# Patient Record
Sex: Female | Born: 1945 | Race: Black or African American | Hispanic: No | Marital: Married | Smoking: Never smoker
Health system: Southern US, Community
[De-identification: ages and names within clinical notes are randomized; demographics above are authoritative.]

## PROBLEM LIST (undated history)

## (undated) DIAGNOSIS — M8580 Other specified disorders of bone density and structure, unspecified site: Secondary | ICD-10-CM

## (undated) DIAGNOSIS — I10 Essential (primary) hypertension: Secondary | ICD-10-CM

## (undated) DIAGNOSIS — K5790 Diverticulosis of intestine, part unspecified, without perforation or abscess without bleeding: Secondary | ICD-10-CM

## (undated) DIAGNOSIS — T8859XA Other complications of anesthesia, initial encounter: Secondary | ICD-10-CM

## (undated) DIAGNOSIS — K449 Diaphragmatic hernia without obstruction or gangrene: Secondary | ICD-10-CM

## (undated) DIAGNOSIS — E7800 Pure hypercholesterolemia, unspecified: Secondary | ICD-10-CM

## (undated) DIAGNOSIS — E559 Vitamin D deficiency, unspecified: Secondary | ICD-10-CM

## (undated) DIAGNOSIS — T4145XA Adverse effect of unspecified anesthetic, initial encounter: Secondary | ICD-10-CM

## (undated) HISTORY — PX: AUGMENTATION MAMMAPLASTY: SUR837

## (undated) HISTORY — DX: Other specified disorders of bone density and structure, unspecified site: M85.80

## (undated) HISTORY — DX: Diverticulosis of intestine, part unspecified, without perforation or abscess without bleeding: K57.90

## (undated) HISTORY — DX: Vitamin D deficiency, unspecified: E55.9

## (undated) HISTORY — PX: OTHER SURGICAL HISTORY: SHX169

## (undated) HISTORY — PX: ABDOMINAL HYSTERECTOMY: SHX81

## (undated) HISTORY — DX: Pure hypercholesterolemia, unspecified: E78.00

## (undated) HISTORY — DX: Diaphragmatic hernia without obstruction or gangrene: K44.9

## (undated) HISTORY — PX: TUBAL LIGATION: SHX77

---

## 1973-09-09 HISTORY — PX: BREAST BIOPSY: SHX20

## 1997-12-16 ENCOUNTER — Ambulatory Visit (HOSPITAL_COMMUNITY): Admission: RE | Admit: 1997-12-16 | Discharge: 1997-12-16 | Payer: Self-pay | Admitting: *Deleted

## 1998-01-04 ENCOUNTER — Other Ambulatory Visit: Admission: RE | Admit: 1998-01-04 | Discharge: 1998-01-04 | Payer: Self-pay | Admitting: Obstetrics and Gynecology

## 1998-05-18 ENCOUNTER — Ambulatory Visit (HOSPITAL_COMMUNITY): Admission: RE | Admit: 1998-05-18 | Discharge: 1998-05-18 | Payer: Self-pay | Admitting: *Deleted

## 1998-12-16 ENCOUNTER — Emergency Department (HOSPITAL_COMMUNITY): Admission: EM | Admit: 1998-12-16 | Discharge: 1998-12-16 | Payer: Self-pay | Admitting: *Deleted

## 2001-09-18 ENCOUNTER — Emergency Department (HOSPITAL_COMMUNITY): Admission: EM | Admit: 2001-09-18 | Discharge: 2001-09-18 | Payer: Self-pay | Admitting: Emergency Medicine

## 2001-09-20 ENCOUNTER — Encounter: Payer: Self-pay | Admitting: Internal Medicine

## 2001-09-20 ENCOUNTER — Emergency Department (HOSPITAL_COMMUNITY): Admission: EM | Admit: 2001-09-20 | Discharge: 2001-09-20 | Payer: Self-pay | Admitting: Emergency Medicine

## 2002-11-03 ENCOUNTER — Encounter: Payer: Self-pay | Admitting: *Deleted

## 2002-11-03 ENCOUNTER — Encounter: Admission: RE | Admit: 2002-11-03 | Discharge: 2002-11-03 | Payer: Self-pay | Admitting: *Deleted

## 2002-11-08 ENCOUNTER — Encounter: Payer: Self-pay | Admitting: Internal Medicine

## 2002-11-08 ENCOUNTER — Encounter: Admission: RE | Admit: 2002-11-08 | Discharge: 2002-11-08 | Payer: Self-pay | Admitting: Internal Medicine

## 2003-06-08 ENCOUNTER — Encounter: Admission: RE | Admit: 2003-06-08 | Discharge: 2003-06-08 | Payer: Self-pay | Admitting: *Deleted

## 2003-06-08 ENCOUNTER — Encounter: Payer: Self-pay | Admitting: *Deleted

## 2004-03-07 ENCOUNTER — Encounter: Admission: RE | Admit: 2004-03-07 | Discharge: 2004-03-07 | Payer: Self-pay | Admitting: Internal Medicine

## 2006-01-09 ENCOUNTER — Ambulatory Visit: Payer: Self-pay | Admitting: Hematology and Oncology

## 2006-01-17 LAB — CBC WITH DIFFERENTIAL/PLATELET
Basophils Absolute: 0 10*3/uL (ref 0.0–0.1)
EOS%: 0.6 % (ref 0.0–7.0)
Eosinophils Absolute: 0 10*3/uL (ref 0.0–0.5)
HCT: 35.3 % (ref 34.8–46.6)
HGB: 11.9 g/dL (ref 11.6–15.9)
MONO#: 0.3 10*3/uL (ref 0.1–0.9)
NEUT#: 1.8 10*3/uL (ref 1.5–6.5)
RDW: 14.3 % (ref 11.3–14.5)
WBC: 3.4 10*3/uL — ABNORMAL LOW (ref 3.9–10.0)
lymph#: 1.2 10*3/uL (ref 0.9–3.3)

## 2006-01-17 LAB — COMPREHENSIVE METABOLIC PANEL
Albumin: 4.5 g/dL (ref 3.5–5.2)
Alkaline Phosphatase: 57 U/L (ref 39–117)
CO2: 28 mEq/L (ref 19–32)
Calcium: 9.3 mg/dL (ref 8.4–10.5)
Chloride: 101 mEq/L (ref 96–112)
Glucose, Bld: 91 mg/dL (ref 70–99)
Potassium: 3.6 mEq/L (ref 3.5–5.3)
Sodium: 139 mEq/L (ref 135–145)
Total Protein: 7.3 g/dL (ref 6.0–8.3)

## 2006-01-20 LAB — FOLATE: Folate: >20 ng/mL

## 2006-01-20 LAB — PROTEIN ELECTROPHORESIS, SERUM
Beta Globulin: 4.9 % (ref 4.7–7.2)
Total Protein, Serum Electrophoresis: 8.1 g/dL (ref 6.0–8.3)

## 2006-03-28 ENCOUNTER — Encounter: Admission: RE | Admit: 2006-03-28 | Discharge: 2006-03-28 | Payer: Self-pay | Admitting: Gastroenterology

## 2006-04-15 ENCOUNTER — Ambulatory Visit: Payer: Self-pay | Admitting: Hematology and Oncology

## 2007-04-14 ENCOUNTER — Ambulatory Visit (HOSPITAL_BASED_OUTPATIENT_CLINIC_OR_DEPARTMENT_OTHER): Admission: RE | Admit: 2007-04-14 | Discharge: 2007-04-14 | Payer: Self-pay | Admitting: Ophthalmology

## 2008-10-05 ENCOUNTER — Encounter: Admission: RE | Admit: 2008-10-05 | Discharge: 2008-10-05 | Payer: Self-pay | Admitting: Family Medicine

## 2010-03-12 ENCOUNTER — Inpatient Hospital Stay (HOSPITAL_COMMUNITY): Admission: EM | Admit: 2010-03-12 | Discharge: 2010-03-13 | Payer: Self-pay | Admitting: Emergency Medicine

## 2010-03-13 ENCOUNTER — Ambulatory Visit: Payer: Self-pay | Admitting: Oncology

## 2010-03-16 LAB — CBC & DIFF AND RETIC
BASO%: 0.2 % (ref 0.0–2.0)
EOS%: 1.4 % (ref 0.0–7.0)
Eosinophils Absolute: 0.1 10*3/uL (ref 0.0–0.5)
HCT: 29.1 % — ABNORMAL LOW (ref 34.8–46.6)
HGB: 10 g/dL — ABNORMAL LOW (ref 11.6–15.9)
LYMPH%: 30 % (ref 14.0–49.7)
MCV: 79.5 fL (ref 79.5–101.0)
MONO%: 6.4 % (ref 0.0–14.0)
NEUT#: 3.1 10*3/uL (ref 1.5–6.5)
NEUT%: 62 % (ref 38.4–76.8)
RDW: 14.9 % — ABNORMAL HIGH (ref 11.2–14.5)
Retic %: 1.22 % (ref 0.50–1.50)

## 2010-03-16 LAB — CHCC SMEAR

## 2010-03-16 LAB — COMPREHENSIVE METABOLIC PANEL
AST: 22 U/L (ref 0–37)
BUN: 16 mg/dL (ref 6–23)
CO2: 27 mEq/L (ref 19–32)
Chloride: 103 mEq/L (ref 96–112)

## 2010-03-16 LAB — LACTATE DEHYDROGENASE: LDH: 164 U/L (ref 94–250)

## 2010-03-16 LAB — MORPHOLOGY

## 2010-03-19 LAB — CREATININE CLEARANCE, URINE, 24 HOUR
Collection Interval-CRCL: 24 hours
Creatinine Clearance: 99 mL/min (ref 75–115)
Creatinine, 24H Ur: 1910 mg/d — ABNORMAL HIGH (ref 700–1800)
Urine Total Volume-CRCL: 3000 mL

## 2010-03-20 LAB — PROTEIN ELECTROPHORESIS, SERUM
Alpha-1-Globulin: 5.1 % — ABNORMAL HIGH (ref 2.9–4.9)
Beta Globulin: 4.6 % — ABNORMAL LOW (ref 4.7–7.2)

## 2010-03-20 LAB — ERYTHROPOIETIN: Erythropoietin: 18.1 m[IU]/mL (ref 2.6–34.0)

## 2010-03-20 LAB — KAPPA/LAMBDA LIGHT CHAINS
Kappa free light chain: 1.37 mg/dL (ref 0.33–1.94)
Kappa:Lambda Ratio: 1.07 (ref 0.26–1.65)
Lambda Free Lght Chn: 1.28 mg/dL (ref 0.57–2.63)

## 2010-03-20 LAB — DIRECT ANTIGLOBULIN TEST (NOT AT ARMC): DAT (Complement): NEGATIVE

## 2010-03-20 LAB — METHYLMALONIC ACID, SERUM: Methylmalonic Acid, Quantitative: 111 nmol/L (ref 87–318)

## 2010-03-20 LAB — HAPTOGLOBIN: Haptoglobin: 210 mg/dL — ABNORMAL HIGH (ref 16–200)

## 2010-06-26 ENCOUNTER — Emergency Department (HOSPITAL_COMMUNITY): Admission: EM | Admit: 2010-06-26 | Discharge: 2010-06-26 | Payer: Self-pay | Admitting: Emergency Medicine

## 2010-11-21 LAB — URINALYSIS, ROUTINE W REFLEX MICROSCOPIC
Glucose, UA: NEGATIVE mg/dL
Hgb urine dipstick: NEGATIVE
Ketones, ur: NEGATIVE mg/dL
Protein, ur: NEGATIVE mg/dL
Urobilinogen, UA: 0.2 mg/dL (ref 0.0–1.0)

## 2010-11-21 LAB — BASIC METABOLIC PANEL
BUN: 11 mg/dL (ref 6–23)
Calcium: 9.5 mg/dL (ref 8.4–10.5)
Chloride: 103 mEq/L (ref 96–112)
GFR calc Af Amer: >60 mL/min (ref >60)
Potassium: 3 mEq/L — ABNORMAL LOW (ref 3.5–5.1)
Sodium: 138 mEq/L (ref 135–145)

## 2010-11-21 LAB — DIFFERENTIAL
Eosinophils Absolute: 0 10*3/uL (ref 0.0–0.7)
Lymphocytes Relative: 38 % (ref 12–46)
Lymphs Abs: 1.6 10*3/uL (ref 0.7–4.0)
Monocytes Absolute: 0.3 10*3/uL (ref 0.1–1.0)
Neutro Abs: 2.2 10*3/uL (ref 1.7–7.7)
Neutrophils Relative %: 52 % (ref 43–77)

## 2010-11-21 LAB — POCT CARDIAC MARKERS
CKMB, poc: <1 ng/mL — ABNORMAL LOW (ref 1.0–8.0)
Troponin i, poc: <0.05 ng/mL (ref 0.00–0.09)

## 2010-11-25 LAB — POCT I-STAT, CHEM 8
BUN: 16 mg/dL (ref 6–23)
Chloride: 107 mEq/L (ref 96–112)
Creatinine, Ser: 1.8 mg/dL — ABNORMAL HIGH (ref 0.4–1.2)
Hemoglobin: 9.9 g/dL — ABNORMAL LOW (ref 12.0–15.0)
Potassium: 2.9 mEq/L — ABNORMAL LOW (ref 3.5–5.1)

## 2010-11-25 LAB — CBC
HCT: 24 % — ABNORMAL LOW (ref 36.0–46.0)
HCT: 27.6 % — ABNORMAL LOW (ref 36.0–46.0)
Hemoglobin: 8.1 g/dL — ABNORMAL LOW (ref 12.0–15.0)
Hemoglobin: 9.4 g/dL — ABNORMAL LOW (ref 12.0–15.0)
MCH: 28.1 pg (ref 26.0–34.0)
MCHC: 34.2 g/dL (ref 30.0–36.0)
MCV: 83.2 fL (ref 78.0–100.0)
Platelets: 145 10*3/uL — ABNORMAL LOW (ref 150–400)
RBC: 2.88 MIL/uL — ABNORMAL LOW (ref 3.87–5.11)
RBC: 3.36 MIL/uL — ABNORMAL LOW (ref 3.87–5.11)
RDW: 15 % (ref 11.5–15.5)
WBC: 4 10*3/uL (ref 4.0–10.5)

## 2010-11-25 LAB — FERRITIN: Ferritin: 219 ng/mL (ref 10–291)

## 2010-11-25 LAB — IRON AND TIBC
Iron: 61 ug/dL (ref 42–135)
TIBC: 180 ug/dL — ABNORMAL LOW (ref 250–470)
UIBC: 119 ug/dL

## 2010-11-25 LAB — URINE CULTURE

## 2010-11-25 LAB — DIFFERENTIAL
Basophils Absolute: 0 10*3/uL (ref 0.0–0.1)
Basophils Relative: 1 % (ref 0–1)
Eosinophils Absolute: 0.1 10*3/uL (ref 0.0–0.7)
Lymphs Abs: 1 10*3/uL (ref 0.7–4.0)
Monocytes Relative: 10 % (ref 3–12)

## 2010-11-25 LAB — COMPREHENSIVE METABOLIC PANEL
Albumin: 2.9 g/dL — ABNORMAL LOW (ref 3.5–5.2)
BUN: 10 mg/dL (ref 6–23)
Calcium: 8.3 mg/dL — ABNORMAL LOW (ref 8.4–10.5)
Glucose, Bld: 96 mg/dL (ref 70–99)
Potassium: 3.3 mEq/L — ABNORMAL LOW (ref 3.5–5.1)
Sodium: 140 mEq/L (ref 135–145)
Total Protein: 5.8 g/dL — ABNORMAL LOW (ref 6.0–8.3)

## 2010-11-25 LAB — URINALYSIS, ROUTINE W REFLEX MICROSCOPIC
Bilirubin Urine: NEGATIVE
Glucose, UA: NEGATIVE mg/dL
Nitrite: NEGATIVE
Protein, ur: NEGATIVE mg/dL
Urobilinogen, UA: 0.2 mg/dL (ref 0.0–1.0)

## 2010-11-25 LAB — URINE MICROSCOPIC-ADD ON

## 2010-11-25 LAB — POCT CARDIAC MARKERS
CKMB, poc: <1 ng/mL — ABNORMAL LOW (ref 1.0–8.0)
Troponin i, poc: <0.05 ng/mL (ref 0.00–0.09)

## 2010-11-25 LAB — RETICULOCYTES: Retic Count, Absolute: 33.2 10*3/uL (ref 19.0–186.0)

## 2010-11-25 LAB — TSH: TSH: 0.865 u[IU]/mL (ref 0.350–4.500)

## 2010-11-25 LAB — FOLATE: Folate: 19 ng/mL

## 2011-01-25 NOTE — Op Note (Signed)
Anne [name], Anne [name]              ACCOUNT NO.:  [id number]   MEDICAL RECORD NO.:  [id number]          PATIENT TYPE:  AMB   LOCATION:  DSC                          FACILITY:  [facility]   PHYSICIAN:  Salley Scarlet., M.D.DATE OF BIRTH:  02/19/1946   DATE OF PROCEDURE:  04/16/2007  DATE OF DISCHARGE:                               OPERATIVE REPORT   PREOPERATIVE DIAGNOSIS:  Chalazion upper lid left eye.   POSTOPERATIVE DIAGNOSIS:  Chalazion upper lid left eye.   OPERATION:  Chalazion excision.   ANESTHESIA:  Local using Xylocaine 1%.   PROCEDURE:  The upper lid on the left eye was inspected and there was  moderate size lesion located on the lateral aspect of the upper lid of  the left eye.  The patient was then prepped and draped in the usual  manner.  The lid was then infiltrated with several mL of Xylocaine.  The  chalazion clamp was applied over the lesion and the lid was everted.  A  cruciate incision was made in the tarsus over the lesion and the lesion  was curetted using a chalazion curette.  The sac was excised in total  using sharp and blunt dissection.  Polysporin ophthalmic ointment and a  pressure patch was applied.  The patient tolerated the procedure well  and was discharged to the post anesthesia recovery room in satisfactory  condition.  She is instructed to take Darvocet N 100 every four hours as  needed for pain, to remove the patch tomorrow, to resume her drops four  times a day and to see me in the office in one week.   DISCHARGE DIAGNOSIS:  Chalazion upper lid left eye.      Salley Scarlet., M.D.  Electronically Signed     TB/MEDQ  D:  04/16/2007  T:  04/16/2007  Job:  [id number]

## 2011-10-01 ENCOUNTER — Inpatient Hospital Stay (HOSPITAL_COMMUNITY)
Admission: EM | Admit: 2011-10-01 | Discharge: 2011-10-02 | DRG: 305 | Disposition: A | Discharge status: Home / Self Care | Payer: Managed Care, Other (non HMO) | Source: Ambulatory Visit | Attending: Internal Medicine | Admitting: Internal Medicine

## 2011-10-01 ENCOUNTER — Other Ambulatory Visit: Payer: Self-pay

## 2011-10-01 ENCOUNTER — Emergency Department (HOSPITAL_COMMUNITY): Payer: Managed Care, Other (non HMO)

## 2011-10-01 ENCOUNTER — Encounter (HOSPITAL_COMMUNITY): Payer: Self-pay | Admitting: *Deleted

## 2011-10-01 DIAGNOSIS — N390 Urinary tract infection, site not specified: Secondary | ICD-10-CM | POA: Diagnosis present

## 2011-10-01 DIAGNOSIS — I2789 Other specified pulmonary heart diseases: Secondary | ICD-10-CM | POA: Diagnosis present

## 2011-10-01 DIAGNOSIS — R0989 Other specified symptoms and signs involving the circulatory and respiratory systems: Secondary | ICD-10-CM | POA: Diagnosis present

## 2011-10-01 DIAGNOSIS — I10 Essential (primary) hypertension: Principal | ICD-10-CM | POA: Diagnosis present

## 2011-10-01 DIAGNOSIS — R0602 Shortness of breath: Secondary | ICD-10-CM | POA: Diagnosis present

## 2011-10-01 DIAGNOSIS — Z888 Allergy status to other drugs, medicaments and biological substances status: Secondary | ICD-10-CM

## 2011-10-01 DIAGNOSIS — R0609 Other forms of dyspnea: Secondary | ICD-10-CM | POA: Diagnosis present

## 2011-10-01 DIAGNOSIS — D649 Anemia, unspecified: Secondary | ICD-10-CM | POA: Diagnosis present

## 2011-10-01 HISTORY — DX: Essential (primary) hypertension: I10

## 2011-10-01 LAB — CARDIAC PANEL(CRET KIN+CKTOT+MB+TROPI)
CK, MB: 1.9 ng/mL (ref 0.3–4.0)
CK, MB: 1.6 ng/mL (ref 0.3–4.0)
Relative Index: 1.2 (ref 0.0–2.5)
Relative Index: INVALID (ref 0.0–2.5)
Total CK: 96 U/L (ref 7–177)
Troponin I: <0.3 ng/mL (ref <0.30)
Troponin I: <0.3 ng/mL (ref <0.30)

## 2011-10-01 LAB — COMPREHENSIVE METABOLIC PANEL
BUN: 8 mg/dL (ref 6–23)
CO2: 27 mEq/L (ref 19–32)
Calcium: 9.4 mg/dL (ref 8.4–10.5)
Chloride: 104 mEq/L (ref 96–112)
Creatinine, Ser: 0.84 mg/dL (ref 0.50–1.10)
GFR calc non Af Amer: 72 mL/min — ABNORMAL LOW (ref >90)
Total Bilirubin: 0.4 mg/dL (ref 0.3–1.2)

## 2011-10-01 LAB — IRON AND TIBC
Iron: 60 ug/dL (ref 42–135)
Saturation Ratios: 24 % (ref 20–55)
TIBC: 255 ug/dL (ref 250–470)
UIBC: 195 ug/dL (ref 125–400)

## 2011-10-01 LAB — URINE MICROSCOPIC-ADD ON

## 2011-10-01 LAB — POCT I-STAT, CHEM 8
Calcium, Ion: 1.15 mmol/L (ref 1.12–1.32)
Creatinine, Ser: 1 mg/dL (ref 0.50–1.10)
Hemoglobin: 12.2 g/dL (ref 12.0–15.0)
Sodium: 141 mEq/L (ref 135–145)
TCO2: 27 mmol/L (ref 0–100)

## 2011-10-01 LAB — URINALYSIS, ROUTINE W REFLEX MICROSCOPIC
Protein, ur: NEGATIVE mg/dL
Urobilinogen, UA: 0.2 mg/dL (ref 0.0–1.0)

## 2011-10-01 LAB — CBC
MCH: 27.9 pg (ref 26.0–34.0)
MCV: 80.9 fL (ref 78.0–100.0)
Platelets: 180 10*3/uL (ref 150–400)
Platelets: 184 10*3/uL (ref 150–400)
RBC: 4.17 MIL/uL (ref 3.87–5.11)
RDW: 14.2 % (ref 11.5–15.5)
WBC: 4.2 10*3/uL (ref 4.0–10.5)

## 2011-10-01 LAB — PROTIME-INR: Prothrombin Time: 13.3 seconds (ref 11.6–15.2)

## 2011-10-01 LAB — DIFFERENTIAL
Basophils Absolute: 0 10*3/uL (ref 0.0–0.1)
Basophils Relative: 1 % (ref 0–1)
Eosinophils Absolute: 0.1 10*3/uL (ref 0.0–0.7)
Eosinophils Relative: 3 % (ref 0–5)

## 2011-10-01 LAB — BASIC METABOLIC PANEL
CO2: 27 mEq/L (ref 19–32)
Calcium: 9.8 mg/dL (ref 8.4–10.5)
Creatinine, Ser: 0.9 mg/dL (ref 0.50–1.10)
Glucose, Bld: 106 mg/dL — ABNORMAL HIGH (ref 70–99)

## 2011-10-01 LAB — APTT: aPTT: 28 seconds (ref 24–37)

## 2011-10-01 LAB — SEDIMENTATION RATE: Sed Rate: 4 mm/hr (ref 0–22)

## 2011-10-01 LAB — URINE CULTURE

## 2011-10-01 LAB — TSH: TSH: 4.258 u[IU]/mL (ref 0.350–4.500)

## 2011-10-01 LAB — POCT I-STAT TROPONIN I

## 2011-10-01 LAB — FERRITIN: Ferritin: 55 ng/mL (ref 10–291)

## 2011-10-01 MED ORDER — DEXTROSE 5 % IV SOLN
1.0000 g | INTRAVENOUS | Status: DC
  Administered 2011-10-01 – 2011-10-02 (×2): 1 g via INTRAVENOUS
  Filled 2011-10-01 (×2): qty 10

## 2011-10-01 MED ORDER — ONDANSETRON HCL 4 MG/2ML IJ SOLN: 4.0000 mg | Freq: Four times a day (QID) | INTRAMUSCULAR | Status: DC | PRN

## 2011-10-01 MED ORDER — HYDRALAZINE HCL 25 MG PO TABS
25.0000 mg | ORAL_TABLET | Freq: Two times a day (BID) | ORAL | Status: DC
  Administered 2011-10-01 – 2011-10-02 (×3): 25 mg via ORAL
  Filled 2011-10-01 (×5): qty 1

## 2011-10-01 MED ORDER — ACETAMINOPHEN 650 MG RE SUPP: 650.0000 mg | Freq: Four times a day (QID) | RECTAL | Status: DC | PRN

## 2011-10-01 MED ORDER — ONDANSETRON HCL 4 MG PO TABS: 4.0000 mg | ORAL_TABLET | Freq: Four times a day (QID) | ORAL | Status: DC | PRN

## 2011-10-01 MED ORDER — ACETAMINOPHEN 325 MG PO TABS: 650.0000 mg | ORAL_TABLET | Freq: Four times a day (QID) | ORAL | Status: DC | PRN

## 2011-10-01 MED ORDER — SODIUM CHLORIDE 0.9 % IJ SOLN
3.0000 mL | Freq: Two times a day (BID) | INTRAMUSCULAR | Status: DC
  Administered 2011-10-01 (×2): 3 mL via INTRAVENOUS

## 2011-10-01 MED ORDER — HYDRALAZINE HCL 20 MG/ML IJ SOLN
10.0000 mg | INTRAMUSCULAR | Status: DC | PRN
  Filled 2011-10-01: qty 0.5

## 2011-10-01 MED ORDER — SODIUM CHLORIDE 0.9 % IV SOLN
Freq: Once | INTRAVENOUS | Status: AC
  Administered 2011-10-01: 01:00:00 via INTRAVENOUS

## 2011-10-01 NOTE — ED Notes (Signed)
Per ems pt watching TV had a sudden onset of sob and left arm pain. Pt 98% on room air and did not appear dyspnic. Pt states her left arm pain was a 10/10 prior to ems X (EMS placed pt on o2, gave 324mg  of baby asa, and 1 NTG, pain down to a 6/10

## 2011-10-01 NOTE — ED Notes (Signed)
ED EKG taken upon arriving in dept. New and old EKG to Dr Hyacinth Meeker.

## 2011-10-01 NOTE — Significant Event (Signed)
RN called this NP because pt was being argumentative about getting BP med. Her BP is up and she needs IV Hydralazine, but the pt is concerned because she has "had so many problems" with BP meds in the past. She says "it is all in my chart" and "you should have the records".  See new allergy updated list. However, every medicine she said she couldn't take, she didn't know the side effect or incident that occurred that caused her to think she was intolerant. She did say she had low potassium once with one drug ? name and had swelling of her legs with the Norvasc. Other than this, she is unable to elaborate on any reactions to BP meds. She doesn't have a list of these intolerances/meds at home and apparently, didn't relay this info on admission. I looked in some old records from a past hospitalization which listed some of the meds I have added to her allergy list under allergy category, but none of the previous documentation states what type of reaction she has had in the past.  I reassured the pt that we were only using IV med to bring her pressure down if it was higher than 160. I told her she really needs to have a thorough record of these meds and their reactions because being that she has hypertension, one of those may save her life one day. I told her because of her statement, we are limited as to what we can give her at this point, and that is why she is only on Apresoline and not any p.o. meds at this point.  Perhaps, day docs can touch base with her PCP to see what has happened in the past with BP meds. I will pass this along in report.  Pt seemed satisfied with explanation, although she continues to have a beligerant and argumentative attitude.  Maren Reamer, NP [facility]

## 2011-10-01 NOTE — Progress Notes (Signed)
   CARE MANAGEMENT NOTE 10/01/2011  Patient:  Anne [name], Anne [name]   Account Number:  [id number]  Date Initiated:  10/01/2011  Documentation initiated by:  Letha Cape  Subjective/Objective Assessment:   dx sob,htn  admit- lives with spouse     Action/Plan:   cycle ce's and echo, bp meds   Anticipated DC Date:  10/02/2011   Anticipated DC Plan:  HOME/SELF CARE      DC Planning Services  CM consult      Choice offered to / List presented to:             Status of service:  In process, will continue to follow Medicare Important Message given?   (If response is "NO", the following Medicare IM given date fields will be blank) Date Medicare IM given:   Date Additional Medicare IM given:    Discharge Disposition:    Per UR Regulation:    Comments:  1/122/13 15:39 Letha Cape RN, BSN [phone] patient lives with spouse, pta independent.

## 2011-10-01 NOTE — Progress Notes (Signed)
Subjective: Patient feeling better. Dyspnea has resolved. She said I did not refuse the medication, I just wanted to know if there was allergy  interaction with my others medications.  Objective: Filed Vitals:   10/01/11 0346 10/01/11 0437 10/01/11 0530 10/01/11 0751  BP: 187/98  160/86 156/75  Pulse: 64  65   Temp: 98.2 F (36.8 C)     TempSrc: Oral     Resp: 18  20   Height:   5\' 4"  (1.626 m)   Weight:  77.2 kg (170 lb 3.1 oz)    SpO2: 100%      Weight change:  No intake or output data in the 24 hours ending 10/01/11 7829  General: Alert, awake, oriented x3, in no acute distress.  HEENT: No bruits, no goiter.  Heart: Regular rate and rhythm, without murmurs, rubs, gallops.  Lungs: CTA, bilateral air movement.  Abdomen: Soft, nontender, nondistended, positive bowel sounds.  Neuro: Grossly intact, nonfocal. Extremities trace edema.   Lab Results:  Basename 10/01/11 0500 10/01/11 0033 10/01/11 0011  NA 140 141 --  K 3.5 3.5 --  CL 104 103 --  CO2 27 -- 27  GLUCOSE 111* 107* --  BUN 8 9 --  CREATININE 0.84 1.00 --  CALCIUM 9.4 -- 9.8  MG -- -- --  PHOS -- -- --    Basename 10/01/11 0500  AST 17  ALT 13  ALKPHOS 60  BILITOT 0.4  PROT 7.5  ALBUMIN 3.8    Basename 10/01/11 0500 10/01/11 0033 10/01/11 0011  WBC 4.2 -- 4.3  NEUTROABS -- -- 1.8  HGB 11.6* 12.2 --  HCT 33.6* 36.0 --  MCV 80.6 -- 80.9  PLT 184 -- 180    Basename 10/01/11 0430  CKTOTAL 155  CKMB 1.9  CKMBINDEX --  TROPONINI <0.30    Basename 10/01/11 0500  VITAMINB12 --  FOLATE --  FERRITIN --  TIBC --  IRON --  RETICCTPCT 1.7    Micro Results: No results found for this or any previous visit (from the past 240 hour(s)).  Studies/Results: Dg Chest Port 1 View  10/01/2011  *RADIOLOGY REPORT*  Clinical Data: Shortness of breath and left arm pain.  PORTABLE CHEST - 1 VIEW  Comparison: PA and lateral chest 06/27/2010.  Findings: The lungs are clear.  Heart size is normal.  No  pneumothorax or effusion.  No focal bony abnormality.  IMPRESSION: No acute disease.  Original Report Authenticated By: Bernadene Bell. Maricela Curet, M.D.    Medications: I have reviewed the patient's current medications.   Patient Active Hospital Problem List:  Shortness of breath (10/01/2011)  Probably related to increase blood pressure. But continue with cycling cardiac enzymes. ECHO pending. D dimer negative.  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 I will start Hydralazine twice a day. Patient prefer not to try a medication that would affect her Potassium.   I ask Pharmacy to review allergy interaction with hydralazine.       LOS: 0 days   REGALADO,BELKYS M.D.  [facility] 10/01/2011, 8:22 AM

## 2011-10-01 NOTE — H&P (Addendum)
Anne [name] is an 66 y.o. female.   PCP - Dr.Katherine Haynes Dage in [place]. Chief Complaint: Shortness of breath. HPI: 66 year-old female with history of hypertension present to the ER because of sudden onset of shortness of breath last night while at home. The shortness of breath lasted for around half an hour and got relieved after the EMS gave the patient sublingual nitroglycerin. However the shortness of breath patient also had left arm pain and some bitemporal headache. At this time patient is asymptomatic and has been admitted to rule out ACS. Patient has been a known hypertensive and used to take Maxzide. But 2 weeks ago when she felt weak she stopped it and start taking over-the-counter medications. Patient denies any nausea vomiting abdominal pain diarrhea or dysuria. She denies any focal deficits or any visual symptoms or any slurring of speech.  Past Medical History  Diagnosis Date  . Hypertension     Past Surgical History  Procedure Date  . Abdominal hysterectomy     Family History  Problem Relation Age of Onset  . Stroke Mother   . Stroke Brother    Social History:  reports that she has never smoked. She does not have any smokeless tobacco history on file. She reports that she does not drink alcohol. Her drug history not on file.  Allergies:  Allergies  Allergen Reactions  . Naproxen Nausea And Vomiting    Medications Prior to Admission  Medication Dose Route Frequency Provider Last Rate Last Dose  . 0.9 %  sodium chloride infusion   Intravenous Once Vida Roller, MD 75 mL/hr at 10/01/11 0033     No current outpatient prescriptions on file as of 10/01/2011.    Results for orders placed during the hospital encounter of 10/01/11 (from the past 48 hour(s))  BASIC METABOLIC PANEL     Status: Abnormal   Collection Time   10/01/11 12:11 AM      Component Value Range Comment   Sodium 139  135 - 145 (mEq/L)    Potassium 3.5  3.5 - 5.1 (mEq/L)    Chloride 101  96 - 112  (mEq/L)    CO2 27  19 - 32 (mEq/L)    Glucose, Bld 106 (*) 70 - 99 (mg/dL)    BUN 10  6 - 23 (mg/dL)    Creatinine, Ser 7.82  0.50 - 1.10 (mg/dL)    Calcium 9.8  8.4 - 10.5 (mg/dL)    GFR calc non Af Amer 67 (*) >90 (mL/min)    GFR calc Af Amer 77 (*) >90 (mL/min)   CBC     Status: Abnormal   Collection Time   10/01/11 12:11 AM      Component Value Range Comment   WBC 4.3  4.0 - 10.5 (K/uL)    RBC 4.19  3.87 - 5.11 (MIL/uL)    Hemoglobin 11.7 (*) 12.0 - 15.0 (g/dL)    HCT 95.6 (*) 21.3 - 46.0 (%)    MCV 80.9  78.0 - 100.0 (fL)    MCH 27.9  26.0 - 34.0 (pg)    MCHC 34.5  30.0 - 36.0 (g/dL)    RDW 08.6  57.8 - 46.9 (%)    Platelets 180  150 - 400 (K/uL)   DIFFERENTIAL     Status: Abnormal   Collection Time   10/01/11 12:11 AM      Component Value Range Comment   Neutrophils Relative 41 (*) 43 - 77 (%)    Neutro  Abs 1.8  1.7 - 7.7 (K/uL)    Lymphocytes Relative 49 (*) 12 - 46 (%)    Lymphs Abs 2.1  0.7 - 4.0 (K/uL)    Monocytes Relative 6  3 - 12 (%)    Monocytes Absolute 0.3  0.1 - 1.0 (K/uL)    Eosinophils Relative 3  0 - 5 (%)    Eosinophils Absolute 0.1  0.0 - 0.7 (K/uL)    Basophils Relative 1  0 - 1 (%)    Basophils Absolute 0.0  0.0 - 0.1 (K/uL)   APTT     Status: Normal   Collection Time   10/01/11 12:11 AM      Component Value Range Comment   aPTT 28  24 - 37 (seconds)   PROTIME-INR     Status: Normal   Collection Time   10/01/11 12:11 AM      Component Value Range Comment   Prothrombin Time 13.3  11.6 - 15.2 (seconds)    INR 0.99  0.00 - 1.49    URINALYSIS, ROUTINE W REFLEX MICROSCOPIC     Status: Abnormal   Collection Time   10/01/11 12:31 AM      Component Value Range Comment   Color, Urine STRAW (*) YELLOW     APPearance CLEAR  CLEAR     Specific Gravity, Urine 1.005  1.005 - 1.030     pH 7.5  5.0 - 8.0     Glucose, UA NEGATIVE  NEGATIVE (mg/dL)    Hgb urine dipstick NEGATIVE  NEGATIVE     Bilirubin Urine NEGATIVE  NEGATIVE     Ketones, ur NEGATIVE   NEGATIVE (mg/dL)    Protein, ur NEGATIVE  NEGATIVE (mg/dL)    Urobilinogen, UA 0.2  0.0 - 1.0 (mg/dL)    Nitrite NEGATIVE  NEGATIVE     Leukocytes, UA MODERATE (*) NEGATIVE    POCT I-STAT TROPONIN I     Status: Normal   Collection Time   10/01/11 12:31 AM      Component Value Range Comment   Troponin i, poc 0.00  0.00 - 0.08 (ng/mL)    Comment 3            URINE MICROSCOPIC-ADD ON     Status: Normal   Collection Time   10/01/11 12:31 AM      Component Value Range Comment   Squamous Epithelial / LPF RARE  RARE     WBC, UA 7-10  <3 (WBC/hpf)    RBC / HPF 0-2  <3 (RBC/hpf)    Bacteria, UA RARE  RARE    POCT I-STAT, CHEM 8     Status: Abnormal   Collection Time   10/01/11 12:33 AM      Component Value Range Comment   Sodium 141  135 - 145 (mEq/L)    Potassium 3.5  3.5 - 5.1 (mEq/L)    Chloride 103  96 - 112 (mEq/L)    BUN 9  6 - 23 (mg/dL)    Creatinine, Ser 3.08  0.50 - 1.10 (mg/dL)    Glucose, Bld 657 (*) 70 - 99 (mg/dL)    Calcium, Ion 8.46  1.12 - 1.32 (mmol/L)    TCO2 27  0 - 100 (mmol/L)    Hemoglobin 12.2  12.0 - 15.0 (g/dL)    HCT 96.2  95.2 - 84.1 (%)    Dg Chest Port 1 View  10/01/2011  *RADIOLOGY REPORT*  Clinical Data: Shortness of breath and left arm pain.  PORTABLE CHEST - 1 VIEW  Comparison: PA and lateral chest 06/27/2010.  Findings: The lungs are clear.  Heart size is normal.  No pneumothorax or effusion.  No focal bony abnormality.  IMPRESSION: No acute disease.  Original Report Authenticated By: Bernadene Bell. Maricela Curet, M.D.    Review of Systems  Constitutional: Negative.   Eyes: Negative.   Respiratory: Positive for shortness of breath.   Cardiovascular: Negative.   Gastrointestinal: Negative.   Genitourinary: Negative.   Musculoskeletal:       Left arm pain.  Skin: Negative.   Neurological: Positive for headaches.  Endo/Heme/Allergies: Negative.   Psychiatric/Behavioral: Negative.     Blood pressure 151/84, pulse 73, temperature 97.9 F (36.6 C),  temperature source Oral, resp. rate 16, SpO2 99.00%. Physical Exam  Constitutional: She is oriented to person, place, and time. She appears well-developed and well-nourished. No distress.  HENT:  Head: Normocephalic and atraumatic.  Right Ear: External ear normal.  Left Ear: External ear normal.  Nose: Nose normal.  Mouth/Throat: Oropharynx is clear and moist. No oropharyngeal exudate.  Eyes: Conjunctivae and EOM are normal. Pupils are equal, round, and reactive to light. Right eye exhibits no discharge. Left eye exhibits no discharge. No scleral icterus.  Neck: Normal range of motion. Neck supple.  Cardiovascular: Normal rate, regular rhythm and normal heart sounds.   Respiratory: Effort normal and breath sounds normal. No respiratory distress. She has no wheezes. She has no rales.  GI: Soft. Bowel sounds are normal. She exhibits no distension. There is no tenderness. There is no rebound.  Musculoskeletal: Normal range of motion. She exhibits no edema and no tenderness.  Neurological: She is alert and oriented to person, place, and time.       Moves upper and lower extremities.  Skin: Skin is warm and dry. No rash noted. She is not diaphoretic. No erythema.  Psychiatric: Her behavior is normal.     Assessment/Plan #1. Shortness of breath with left arm pain improved with nitroglycerin will rule out ACS - we will cycle cardiac markers and get 2-D echo. Check d-dimer and if positive a CT angiogram of the chest. As patient also had some headache we will check sedimentation rate. #2. History of hypertension off medications - patient stopped her medications herself. She used to be on Maxzide. Presently we'll observe and I have checked patient when necessary IV hydralazine. #3. UTI - check urine culture. Will place patient on IV ceftriaxone. #4. Anemia - check anemia panel.  CODE STATUS - full code.  Kadience Macchi N. 10/01/2011, 3:21 AM

## 2011-10-01 NOTE — Progress Notes (Signed)
Utilization review completed.  

## 2011-10-01 NOTE — Progress Notes (Signed)
  Echocardiogram 2D Echocardiogram has been performed.  Bryon Parker F 10/01/2011, 10:12 AM

## 2011-10-01 NOTE — ED Provider Notes (Signed)
History     CSN: [id number]  Arrival date & time 10/01/11  0011   First MD Initiated Contact with Patient 10/01/11 0012      Chief Complaint  Patient presents with  . Arm Pain  . 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 This came on acutely and she sat on the couch watching television this evening. It started approximately 45 minutes prior to arrival, was constant, severe and when paramedics were called they found her to be hypertensive, gave her 4 baby aspirin's and a sublingual nitroglycerin which the patient states improved her symptoms and almost completely resolved in. On arrival her symptoms are absent, she denies fevers, chills, cough, nausea or vomiting, swelling, rash, sore throat, headache. She states that she saw her primary care doctor approximately 2 weeks ago at which time she told that person that she did not want to be on antihypertensives any longer. He was recommended to her to take some over-the-counter natural medications to help with her blood pressure. Since that time she has noticed a slightly elevated blood pressure at 165 and then today the 190/100. She denies any syncopal episodes and denies any chest pain this evening.  Patient and spouse also admits to frequent urination this evening with mild dysuria  Patient is a 66 y.o. female presenting with arm pain and shortness of breath. The history is provided by the patient, the spouse and the EMS personnel.  Arm Pain Associated symptoms include shortness of breath.  Shortness of Breath  Associated symptoms include shortness of breath.    Past Medical History  Diagnosis Date  . Hypertension     History reviewed. No pertinent past surgical history.  History  reviewed. No pertinent family history.  History  Substance Use Topics  . Smoking status: Not on file  . Smokeless tobacco: Not on file  . Alcohol Use:     OB History    Grav Para Term Preterm Abortions TAB SAB Ect Mult Living                  Review of Systems  Respiratory: Positive for shortness of breath.   All other systems reviewed and are negative.    Allergies  Naproxen  Home Medications   Current Outpatient Rx  Name Route Sig Dispense Refill  . CALCIUM CITRATE PO Oral Take 1 tablet by mouth daily.    Marland Kitchen VITAMIN D 1000 UNITS PO TABS Oral Take 1,000 Units by mouth daily.    Marland Kitchen OVER THE COUNTER MEDICATION Oral Take 1 tablet by mouth 3 (three) times daily. BP Manager    . OVER THE COUNTER MEDICATION Oral Take 4 drops by mouth 3 (three) times daily. Aqua-Rite    . OVER THE COUNTER MEDICATION Oral Take by mouth daily. Potassium broth    . TRIAMTERENE-HCTZ 37.5-25 MG PO TABS Oral Take 1 tablet by mouth daily.      BP 163/79  Pulse 65  Temp(Src) 97.9 F (36.6 C) (Oral)  Resp 10  SpO2 99%  Physical Exam  Nursing note and vitals reviewed. Constitutional: She appears well-developed and well-nourished. No distress.  HENT:  Head: Normocephalic and atraumatic.  Mouth/Throat: Oropharynx is clear and moist. No oropharyngeal exudate.  Eyes: Conjunctivae and EOM are normal. Pupils are equal, round, and reactive to light. Right eye exhibits no discharge. Left  eye exhibits no discharge. No scleral icterus.  Neck: Normal range of motion. Neck supple. No JVD present. No thyromegaly present.  Cardiovascular: Normal rate, regular rhythm, normal heart sounds and intact distal pulses.  Exam reveals no gallop and no friction rub.   No murmur heard. Pulmonary/Chest: Effort normal and breath sounds normal. No respiratory distress. She has no wheezes. She has no rales.  Abdominal: Soft. Bowel sounds are normal. She exhibits no distension and no mass. There is tenderness (mild suprapubic  tenderness).       No right lower quadrant or upper abdominal tenderness, non-peritoneal  Musculoskeletal: Normal range of motion. She exhibits no edema and no tenderness.  Lymphadenopathy:    She has no cervical adenopathy.  Neurological: She is alert. Coordination normal.  Skin: Skin is warm and dry. No rash noted. No erythema.  Psychiatric: She has a normal mood and affect. Her behavior is normal.    ED Course  Procedures (including critical care time)  Labs Reviewed  BASIC METABOLIC PANEL - Abnormal; Notable for the following:    Glucose, Bld 106 (*)    GFR calc non Af Amer 67 (*)    GFR calc Af Amer 77 (*)    All other components within normal limits  CBC - Abnormal; Notable for the following:    Hemoglobin 11.7 (*)    HCT 33.9 (*)    All other components within normal limits  DIFFERENTIAL - Abnormal; Notable for the following:    Neutrophils Relative 41 (*)    Lymphocytes Relative 49 (*)    All other components within normal limits  URINALYSIS, ROUTINE W REFLEX MICROSCOPIC - Abnormal; Notable for the following:    Color, Urine STRAW (*)    Leukocytes, UA MODERATE (*)    All other components within normal limits  POCT I-STAT, CHEM 8 - Abnormal; Notable for the following:    Glucose, Bld 107 (*)    All other components within normal limits  APTT  PROTIME-INR  POCT I-STAT TROPONIN I  URINE MICROSCOPIC-ADD ON  I-STAT TROPONIN I  I-STAT, CHEM 8   Dg Chest Port 1 View  10/01/2011  *RADIOLOGY REPORT*  Clinical Data: Shortness of breath and left arm pain.  PORTABLE CHEST - 1 VIEW  Comparison: PA and lateral chest 06/27/2010.  Findings: The lungs are clear.  Heart size is normal.  No pneumothorax or effusion.  No focal bony abnormality.  IMPRESSION: No acute disease.  Original Report Authenticated By: Bernadene Bell. D'ALESSIO, M.D.     1. Chest pain       MDM  Review of the patient's medical record shows that she had a history of acute renal failure which resolved during  her prior admission in July of 2011. She states that she thinks she may have had a stress test though there is no record of this in the medical record. She was also noted to have a urinary tract infection at that time.   ED ECG REPORT   Date: 10/01/2011 00:06  Rate: 67   Rhythm: normal sinus rhythm  QRS Axis: normal  Intervals: normal  ST/T Wave abnormalities: normal  Conduction Disutrbances:none  Narrative Interpretation:   Old EKG Reviewed: unchanged and From 03/12/2010  Blood work reviewed, shows no hypokalemia, she does have a normal white blood cell count and a mild urinary tract infection. Troponin was negative and chest x-ray shows no acute disease. I believe this is a patient is having unusual symptoms, elevated hypertension and is currently  unmedicated for this she should have a rule out admission to the hospital. I discussed this with the hospitalist who agrees and is given temporary orders for admission to a telemetry bed.   Vida Roller, MD 10/01/11 [phone]

## 2011-10-02 LAB — BASIC METABOLIC PANEL
BUN: 14 mg/dL (ref 6–23)
CO2: 28 mEq/L (ref 19–32)
Chloride: 103 mEq/L (ref 96–112)
Creatinine, Ser: 0.99 mg/dL (ref 0.50–1.10)

## 2011-10-02 MED ORDER — LEVOFLOXACIN 500 MG PO TABS: 500.0000 mg | ORAL_TABLET | Freq: Every day | ORAL | Status: AC

## 2011-10-02 MED ORDER — HYDRALAZINE HCL 25 MG PO TABS: 25.0000 mg | ORAL_TABLET | Freq: Two times a day (BID) | ORAL | Status: DC

## 2011-10-02 NOTE — Progress Notes (Signed)
   CARE MANAGEMENT NOTE 10/02/2011  Patient:  Anne [name], Anne [name]   Account Number:  [id number]  Date Initiated:  10/01/2011  Documentation initiated by:  Letha Cape  Subjective/Objective Assessment:   dx sob,htn  admit- lives with spouse     Action/Plan:   cycle ce's and echo, bp meds   Anticipated DC Date:  10/02/2011   Anticipated DC Plan:  HOME/SELF CARE      DC Planning Services  CM consult      Choice offered to / List presented to:             Status of service:  Completed, signed off Medicare Important Message given?   (If response is "NO", the following Medicare IM given date fields will be blank) Date Medicare IM given:   Date Additional Medicare IM given:    Discharge Disposition:  HOME/SELF CARE  Per UR Regulation:    Comments:  10/02/11 15:06 Letha Cape RN, BSn [phone] Patient dc to home , no needs identified.  1/122/13 15:39 Letha Cape RN, BSN [phone] patient lives with spouse, pta independent.

## 2011-10-02 NOTE — Discharge Summary (Signed)
DISCHARGE SUMMARY  Anne [name]  MR#: [id number]  DOB:03/22/1946  Date of Admission: 10/01/2011 Date of Discharge: 10/02/2011  Attending Physician:Seeley Southgate  Patient's PCP:No primary provider on file.  Consults:   Discharge Diagnoses: #1 shortness of breath questionable angina #2 hypertension #3 asymptomatic bacteuria #4 anemia. #5 mild pulmonary hypertension  Present on Admission:  .Shortness of breath .HTN (hypertension)    Medication List  As of 10/02/2011  8:47 AM   STOP taking these medications         triamterene-hydrochlorothiazide 37.5-25 MG per tablet         TAKE these medications         CALCIUM CITRATE PO   Take 1 tablet by mouth daily.      cholecalciferol 1000 UNITS tablet   Commonly known as: VITAMIN D   Take 1,000 Units by mouth daily.      hydrALAZINE 25 MG tablet   Commonly known as: APRESOLINE   Take 1 tablet (25 mg total) by mouth 2 (two) times daily.      levofloxacin 500 MG tablet   Commonly known as: LEVAQUIN   Take 1 tablet (500 mg total) by mouth daily.      OVER THE COUNTER MEDICATION   Take 1 tablet by mouth 3 (three) times daily. BP Manager      OVER THE COUNTER MEDICATION   Take 4 drops by mouth 3 (three) times daily. Aqua-Rite      OVER THE COUNTER MEDICATION   Take by mouth daily. Potassium broth              Hospital Course: Patient is a 66 year old African American female with history of hypertension was admitted to the hospital on 10/01/2011 with sudden onset of shortness of breath while at home. The shortness of breath  lasted about 30 minutes was relieved by nitroglycerin. Patient was also said to have had and pain bitemporal headaches. No documented history of chest pain. No nausea or vomiting. No fever no chills no Rigors. At the time patient was seen by the admitting physician, shortness of breath was said to have been relieved and she was completely asymptomatic.      Examination of patient was said  to have been unremarkable. However, patient had elevated blood pressure. Patient was admitted to telemetry. Blood pressure was controlled with IV hydralazine as well as by mouth hydralazine. She was given Zofran for nausea. 3 set of cardiac enzymes the patient was unremarkable. Chest x-ray was normal. EKG done did not show any acute ST-T wave changes. 2-D echo showed EF of 60-65%, number ventricular cavity and slightly elevated pulmonary artery pressure 36 mm of mercury. Not done patient showed moderate leukocyte. Also given to patient was IV ceftriaxone for asymptomatic bacteria.      Patient was seen by me  today for the first time 10/02/2011. She denied any shortness of breath. No chest pain. No headaches. No nausea or vomiting. She also denies any urinary symptoms. Examination of patient was unremarkable. Vital signs are stable. I discussed patient's case extensively with patient with the RN as well and she verbalized understanding. Patient also  declined to take any diuretics because of prior side effects she had. Plan is for patient to be discharge home today.   Day of Discharge BP 157/80  Pulse 65  Temp(Src) 98 F (36.7 C) (Tympanic)  Resp 18  Ht 5\' 4"  (1.626 m)  Wt 77.2 kg (170 lb 3.1 oz)  BMI 29.21 kg/m2  SpO2  99%  Physical Exam: Vitals as above. HEENT-mild pallor Neck-no JVD Chest-clear to auscultation CVS-S1 and S2 Abdomen-soft, nontender, organs not palpable, bowel sounds are positive. Extremity-trace edema Neuro-no lateralizing signs Skin-no ecchymoses Neuro psych-appropriate affect.  Results for orders placed during the hospital encounter of 10/01/11 (from the past 24 hour(s))  CARDIAC PANEL(CRET KIN+CKTOT+MB+TROPI)     Status: Normal   Collection Time   10/01/11 11:25 AM      Component Value Range   Total CK 112  7 - 177 (U/L)   CK, MB 1.8  0.3 - 4.0 (ng/mL)   Troponin I <0.30  <0.30 (ng/mL)   Relative Index 1.6  0.0 - 2.5   CARDIAC PANEL(CRET KIN+CKTOT+MB+TROPI)      Status: Normal   Collection Time   10/01/11  7:55 PM      Component Value Range   Total CK 96  7 - 177 (U/L)   CK, MB 1.6  0.3 - 4.0 (ng/mL)   Troponin I <0.30  <0.30 (ng/mL)   Relative Index RELATIVE INDEX IS INVALID  0.0 - 2.5   BASIC METABOLIC PANEL     Status: Abnormal   Collection Time   10/02/11  5:45 AM      Component Value Range   Sodium 141  135 - 145 (mEq/L)   Potassium 3.5  3.5 - 5.1 (mEq/L)   Chloride 103  96 - 112 (mEq/L)   CO2 28  19 - 32 (mEq/L)   Glucose, Bld 104 (*) 70 - 99 (mg/dL)   BUN 14  6 - 23 (mg/dL)   Creatinine, Ser 2.13  0.50 - 1.10 (mg/dL)   Calcium 9.3  8.4 - 08.6 (mg/dL)   GFR calc non Af Amer 59 (*) >90 (mL/min)   GFR calc Af Amer 69 (*) >90 (mL/min)    Disposition: stable   Follow-up Appts: Discharge Orders    Future Orders Please Complete By Expires   Diet - low sodium heart healthy      Increase activity slowly      Discharge instructions      Comments:   Follow up with pcp in 1-2weeks        Signed: Finas Delone 10/02/2011, 8:47 AM

## 2011-10-30 ENCOUNTER — Emergency Department (HOSPITAL_COMMUNITY)
Admission: EM | Admit: 2011-10-30 | Discharge: 2011-10-31 | Disposition: A | Discharge status: Home Health | Payer: Managed Care, Other (non HMO) | Attending: Emergency Medicine | Admitting: Emergency Medicine

## 2011-10-30 ENCOUNTER — Encounter (HOSPITAL_COMMUNITY): Payer: Self-pay | Admitting: Emergency Medicine

## 2011-10-30 DIAGNOSIS — I10 Essential (primary) hypertension: Secondary | ICD-10-CM | POA: Insufficient documentation

## 2011-10-30 NOTE — ED Notes (Signed)
Patient complaining of high blood pressure (highest reading today 202/114) -- patient complaining of headache and dizziness; denies shortness of breath and chest pain.  Patient was discharged on the 23rd of January from the hospital due to high blood pressure, headaches, and chest pain.  Patient was given hydralazine; patient followed up with PCP, which increased the dosage.  Blood pressure continued to increase, along with upper and lower extremity swelling.  PCP then prescribed Lasix.

## 2011-10-31 ENCOUNTER — Other Ambulatory Visit: Payer: Self-pay

## 2011-10-31 LAB — DIFFERENTIAL
Basophils Absolute: 0 10*3/uL (ref 0.0–0.1)
Eosinophils Relative: 4 % (ref 0–5)
Lymphocytes Relative: 48 % — ABNORMAL HIGH (ref 12–46)
Monocytes Absolute: 0.3 10*3/uL (ref 0.1–1.0)

## 2011-10-31 LAB — BASIC METABOLIC PANEL
CO2: 27 mEq/L (ref 19–32)
Calcium: 9.7 mg/dL (ref 8.4–10.5)
Creatinine, Ser: 0.88 mg/dL (ref 0.50–1.10)
Glucose, Bld: 100 mg/dL — ABNORMAL HIGH (ref 70–99)

## 2011-10-31 LAB — CBC
HCT: 36.1 % (ref 36.0–46.0)
MCV: 81.3 fL (ref 78.0–100.0)
RDW: 14.1 % (ref 11.5–15.5)
WBC: 4 10*3/uL (ref 4.0–10.5)

## 2011-10-31 NOTE — Discharge Instructions (Signed)
Hypertension Information As your heart beats, it forces blood through your arteries. This force is your blood pressure. If the pressure is too high, it is called hypertension (HTN) or high blood pressure. HTN is dangerous because you may have it and not know it. High blood pressure may mean that your heart has to work harder to pump blood. Your arteries may be narrow or stiff. The extra work puts you at risk for heart disease, stroke, and other problems.  Blood pressure consists of two numbers, a higher number over a lower, 110/72, for example. It is stated as "110 over 72." The ideal is below 120 for the top number (systolic) and under 80 for the bottom (diastolic).  You should pay close attention to your blood pressure if you have certain conditions such as:  Heart failure.   Prior heart attack.   Diabetes   Chronic kidney disease.   Prior stroke.   Multiple risk factors for heart disease.  To see if you have HTN, your blood pressure should be measured while you are seated with your arm held at the level of the heart. It should be measured at least twice. A one-time elevated blood pressure reading (especially in the Emergency Department) does not mean that you need treatment. There may be conditions in which the blood pressure is different between your right and left arms. It is important to see your caregiver soon for a recheck. Most people have essential hypertension which means that there is not a specific cause. This type of high blood pressure may be lowered by changing lifestyle factors such as:  Stress.   Smoking.   Lack of exercise.   Excessive weight.   Drug/tobacco/alcohol use.   Eating less salt.  Most people do not have symptoms from high blood pressure until it has caused damage to the body. Effective treatment can often prevent, delay or reduce that damage. TREATMENT  Treatment for high blood pressure, when a cause has been identified, is directed at the cause. There  are a large number of medications to treat HTN. These fall into several categories, and your caregiver will help you select the medicines that are best for you. Medications may have side effects. You should review side effects with your caregiver. If your blood pressure stays high after you have made lifestyle changes or started on medicines,   Your medication(s) may need to be changed.   Other problems may need to be addressed.   Be certain you understand your prescriptions, and know how and when to take your medicine.   Be sure to follow up with your caregiver within the time frame advised (usually within two weeks) to have your blood pressure rechecked and to review your medications.   If you are taking more than one medicine to lower your blood pressure, make sure you know how and at what times they should be taken. Taking two medicines at the same time can result in blood pressure that is too low.  Document Released: 10/29/2005 Document Revised: 05/08/2011 Document Reviewed: 11/05/2007 [employer] Patient Information 2012 [employer]. 

## 2011-10-31 NOTE — ED Provider Notes (Signed)
History     CSN: [id number]  Arrival date & time 10/30/11  2216   First MD Initiated Contact with Patient 10/31/11 0029      Chief Complaint  Patient presents with  . Hypertension    (Consider location/radiation/quality/duration/timing/severity/associated sxs/prior treatment) Patient is a 66 y.o. female presenting with hypertension.  Hypertension   Pt states last month she was admitted to the hospital for issues with her hypertension.    Pt started taking hydralizine for her blood pressure.  Her doctor increased her dose to 25 mg tid because of persistent high blood pressure and swelling.  She then had it increased to 50mg . This afternoon she was feeling bad and checked her blood pressure was over 200.  She felt dizzy and "out of sorts".  Pt also had a headache.  Patient states she's had trouble with high blood pressure for years and previously was on Maxzide. Patient states when she was in the hospital recently they took her off that and put her on hydralazine. She is not sure if it was because of her potassium. Patient has tried several other blood pressure medications in the past but has not done well with those. Past Medical History  Diagnosis Date  . Hypertension     Past Surgical History  Procedure Date  . Abdominal hysterectomy     Family History  Problem Relation Age of Onset  . Stroke Mother   . Stroke Brother     History  Substance Use Topics  . Smoking status: Never Smoker   . Smokeless tobacco: Not on file  . Alcohol Use: No    OB History    Grav Para Term Preterm Abortions TAB SAB Ect Mult Living                  Review of Systems  All other systems reviewed and are negative.    Allergies  Norvasc; Cardizem; Hytrin; Lisinopril; Lopressor; Maxzide; Naproxen; and Tenex  Home Medications   Current Outpatient Rx  Name Route Sig Dispense Refill  . CALCIUM CITRATE PO Oral Take 1 tablet by mouth daily.    Marland Kitchen VITAMIN D 1000 UNITS PO TABS Oral Take  1,000 Units by mouth daily.    . FUROSEMIDE 20 MG PO TABS Oral Take 20 mg by mouth daily as needed. For swelling    . HYDRALAZINE HCL 25 MG PO TABS Oral Take 50 mg by mouth 3 (three) times daily.    Marland Kitchen OVER THE COUNTER MEDICATION Oral Take by mouth daily. Potassium broth      BP 170/90  Pulse 72  Temp(Src) 98.2 F (36.8 C) (Oral)  Resp 20  SpO2 100%  Physical Exam  Nursing note and vitals reviewed. Constitutional: She appears well-developed and well-nourished. No distress.  HENT:  Head: Normocephalic and atraumatic.  Right Ear: External ear normal.  Left Ear: External ear normal.  Eyes: Conjunctivae are normal. Right eye exhibits no discharge. Left eye exhibits no discharge. No scleral icterus.  Neck: Neck supple. No tracheal deviation present.  Cardiovascular: Normal rate, regular rhythm and intact distal pulses.   Pulmonary/Chest: Effort normal and breath sounds normal. No stridor. No respiratory distress. She has no wheezes. She has no rales.  Abdominal: Soft. Bowel sounds are normal. She exhibits no distension. There is no tenderness. There is no rebound and no guarding.  Musculoskeletal: She exhibits no edema and no tenderness.  Neurological: She is alert. She has normal strength. No sensory deficit. Cranial nerve deficit:  no  gross defecits noted. She exhibits normal muscle tone. She displays no seizure activity. Coordination normal.  Skin: Skin is warm and dry. No rash noted.  Psychiatric: She has a normal mood and affect.    ED Course  Procedures (including critical care time)  Date: 10/31/2011  Rate: 65  Rhythm: normal sinus rhythm  QRS Axis: normal  Intervals: normal  ST/T Wave abnormalities: normal  Conduction Disutrbances:none  Narrative Interpretation:   Old EKG Reviewed: unchanged   Labs Reviewed  DIFFERENTIAL - Abnormal; Notable for the following:    Neutrophils Relative 40 (*)    Neutro Abs 1.6 (*)    Lymphocytes Relative 48 (*)    All other components  within normal limits  BASIC METABOLIC PANEL - Abnormal; Notable for the following:    Potassium 3.2 (*)    Glucose, Bld 100 (*)    GFR calc non Af Amer 68 (*)    GFR calc Af Amer 79 (*)    All other components within normal limits  CBC   No results found.   1. HTN (hypertension)       MDM    Patient presents emergent complaints of hypertension. Without intervention her blood pressures improved to 145/80. At this time I do not feel there is any acute emergency medical condition associated with her hypertension. There is no evidence of end organ ischemia. She has no chest pain or neurologic symptoms. I encouraged the patient to followup with her doctor to continue her outpatient treatment of her hypertension. The patient is comfortable with this plan and she will continue on her hydralazine at her current dose.      Celene Kras, MD 10/31/11 [id number]

## 2012-01-15 ENCOUNTER — Other Ambulatory Visit: Payer: Self-pay | Admitting: Physician Assistant

## 2012-01-15 DIAGNOSIS — R1032 Left lower quadrant pain: Secondary | ICD-10-CM

## 2012-01-16 ENCOUNTER — Ambulatory Visit
Admission: RE | Admit: 2012-01-16 | Discharge: 2012-01-16 | Disposition: A | Discharge status: Home / Self Care | Payer: Managed Care, Other (non HMO) | Source: Ambulatory Visit | Attending: Physician Assistant | Admitting: Physician Assistant

## 2012-01-16 DIAGNOSIS — R1032 Left lower quadrant pain: Secondary | ICD-10-CM

## 2012-01-16 MED ORDER — IOHEXOL 300 MG/ML  SOLN: 100.0000 mL | Freq: Once | INTRAMUSCULAR | Status: AC | PRN

## 2012-01-17 ENCOUNTER — Other Ambulatory Visit: Payer: Self-pay | Admitting: Family Medicine

## 2012-01-17 DIAGNOSIS — K869 Disease of pancreas, unspecified: Secondary | ICD-10-CM

## 2012-01-20 ENCOUNTER — Ambulatory Visit
Admission: RE | Admit: 2012-01-20 | Discharge: 2012-01-20 | Disposition: A | Discharge status: Home / Self Care | Payer: Managed Care, Other (non HMO) | Source: Ambulatory Visit | Attending: Family Medicine | Admitting: Family Medicine

## 2012-01-20 DIAGNOSIS — K869 Disease of pancreas, unspecified: Secondary | ICD-10-CM

## 2012-01-20 MED ORDER — GADOBENATE DIMEGLUMINE 529 MG/ML IV SOLN
15.0000 mL | Freq: Once | INTRAVENOUS | Status: AC | PRN
  Administered 2012-01-20: 15 mL via INTRAVENOUS

## 2012-08-31 ENCOUNTER — Encounter: Payer: Self-pay | Admitting: Cardiology

## 2012-08-31 ENCOUNTER — Ambulatory Visit (INDEPENDENT_AMBULATORY_CARE_PROVIDER_SITE_OTHER): Payer: Managed Care, Other (non HMO) | Admitting: Cardiology

## 2012-08-31 VITALS — BP 158/95 | HR 87 | Ht 64.0 in | Wt 166.0 lb

## 2012-08-31 DIAGNOSIS — Z888 Allergy status to other drugs, medicaments and biological substances status: Secondary | ICD-10-CM

## 2012-08-31 DIAGNOSIS — Z789 Other specified health status: Secondary | ICD-10-CM

## 2012-08-31 DIAGNOSIS — I10 Essential (primary) hypertension: Secondary | ICD-10-CM

## 2012-08-31 DIAGNOSIS — E876 Hypokalemia: Secondary | ICD-10-CM | POA: Insufficient documentation

## 2012-08-31 MED ORDER — SPIRONOLACTONE 25 MG PO TABS: 25.0000 mg | ORAL_TABLET | Freq: Every day | ORAL | Status: DC

## 2012-08-31 NOTE — Patient Instructions (Signed)
Stop indapamide  Start aldactone 25 mg one half tablet daily.  We will schedule you for a renal doppler  I will see you in 4 weeks with lab work.

## 2012-09-01 ENCOUNTER — Encounter: Payer: Self-pay | Admitting: Cardiology

## 2012-09-01 NOTE — Progress Notes (Signed)
Anne [name] Date of Birth: July 22, 1946 Medical Record #[id number]  History of Present Illness: Anne [name] is self-referred for evaluation and management of hypertension. She is a 66 year old black female who reports that she has had hypertension since age 52. This has been difficult to control primarily because of multiple drug intolerances. She brings extensive records from her primary care which indicate intolerance to multiple blood pressure medications. Lisinopril caused a cough. Lopressor caused hypotension. She developed acute renal failure and hypokalemia on Maxzide. There are also intolerances to Tenex, Hytrin, diltiazem, Norvasc, Diovan, and Bystolic. The nature of intolerances not clearly known for these medications. Norvasc seemed to cause swelling. Diltiazem caused nausea and vomiting. She reports that she has been very sensitive to any medication in the past. As a result her blood pressure really has never been under good control. Typical readings are in the 160s systolic with diastolics in the 90-100 range. She is currently on indapamide but complains of dizziness and urinary frequency with incontinence. She also complains of extreme thirst. She currently denies any headaches or visual changes. She has no chest pain or shortness of breath. She does report that Maxide did control her blood pressure but resulted in her being hospitalized for acute renal failure. In reviewing that admission her creatinine went up to 1.8 and she did have a low potassium. She has no history of stroke or congestive heart failure. She does have elevated cholesterol.  Current Outpatient Prescriptions on File Prior to Visit  Medication Sig Dispense Refill  . CALCIUM CITRATE PO Take 500 mg by mouth daily.       . cholecalciferol (VITAMIN D) 1000 UNITS tablet Take 1,000 Units by mouth daily.      . INDAPAMIDE PO Take 7 mg by mouth daily.      Marland Kitchen spironolactone (ALDACTONE) 25 MG tablet Take 1 tablet (25 mg  total) by mouth daily.  30 tablet  11    Allergies  Allergen Reactions  . Norvasc (Amlodipine Besylate) Swelling    Severe edema   . Asa (Aspirin)     Causes stomach issues  . Diltiazem Hcl Other (See Comments)    Nausea and vomiting  . Diovan (Valsartan) Other (See Comments)    Unable to remember reaction  . Guanfacine Hcl   . Hytrin (Terazosin Hcl) Other (See Comments)    Pt states she can not take this med but can not relate any side effect or incident that occurred  . Lisinopril Other (See Comments)    Unknown, pt says she "can't take it", cough  . Lopressor (Metoprolol Tartrate) Other (See Comments)    hypotension  . Naproxen Nausea And Vomiting    Gastric intolerance  . Penicillins Itching  . Triamterene-Hctz Other (See Comments)    Acute renal failure, hypokalemia     Past Medical History  Diagnosis Date  . Hypertension   . Anemia   . Hiatal hernia   . Hypercholesterolemia   . GERD (gastroesophageal reflux disease)   . Vitamin D deficiency   . Osteopenia   . Diverticulosis     Past Surgical History  Procedure Date  . Abdominal hysterectomy   . Fibrocystic breast excision   . Tubal ligation     History  Smoking status  . Never Smoker   Smokeless tobacco  . Not on file    History  Alcohol Use No    Family History  Problem Relation Age of Onset  . Stroke Mother   . Hypertension  Mother   . Stroke Brother     Review of Systems: The review of systems is positive for periodic mild swelling in her fingers and feet.  She complains of urinary incontinence. She has extreme thirst and some muscle spasms. She denies any dyspnea, chest pain, or palpitations. All other systems were reviewed and are negative.  Physical Exam: BP 158/95  Pulse 87  Ht 5\' 4"  (1.626 m)  Wt 166 lb (75.297 kg)  BMI 28.49 kg/m2 I repeated her blood pressure and got 176/94 in both arms. She is a pleasant black female in no acute distress.The patient is alert and oriented x 3.   The mood and affect are normal.  The skin is warm and dry.  Color is normal.  The HEENT exam reveals that the sclera are nonicteric.  The mucous membranes are moist.  The carotids are 2+ without bruits.  There is no thyromegaly.  There is no JVD.  The lungs are clear.   The heart exam reveals a regular rate with a normal S1 and S2.  There are no murmurs, gallops, or rubs.  The PMI is not displaced.   Abdominal exam reveals good bowel sounds.  There is no guarding or rebound.  There is no hepatosplenomegaly or tenderness.  There are no masses.  Exam of the legs reveal no clubbing, cyanosis, or edema.  The legs are without rashes.  The distal pulses are intact.  Cranial nerves II - XII are intact.  Motor and sensory functions are intact.  The gait is normal.   LABORATORY DATA: ECG demonstrates normal sinus rhythm with a normal ECG.  Assessment / Plan: 1. Hypertension, uncontrolled. Treatment has been limited by multiple drug intolerances as noted. I think it is unlikely that she has secondary causes for hypertension since it has been of long-standing duration and there is a strong family history of hypertension. I suspect she has essential hypertension. There is no evidence of significant renal disease based on her creatinine and she has had previous abdominal MRI and CT which showed no evidence of significant renal pathology. I have recommended a renal duplex to rule out renal artery stenosis and I think this is likely to be normal. I have stressed the importance of lifestyle modification. I recommended sodium restriction. She reports that she uses Himalayan salt and I explained this is still sodium chloride and needs to be restricted. She needs to get regular aerobic exercise. I recommended stopping indapamide and we will try her on Aldactone starting at 12.5 mg daily. My goal would be to start her on low doses of medication and gradually make adjustments to make sure she can tolerate it. I think Aldactone is  a reasonable choice since she does have a history of hypokalemia. Some of her prior intolerances may have just been dose related so we may consider in the future a combination of therapy at low dose to try to minimize any side effects. I'll plan on followup again in one month. We will check a basic metabolic panel at that time. If her urinary incontinence does not improve off of indapamide she may need to have urologic evaluation.  2. Hypercholesterolemia. Prior LDL was moderately elevated. With her history of drug intolerances I would avoid medication at this time until we at least get her blood pressure under better control and then we can readdress.

## 2012-09-03 ENCOUNTER — Encounter (INDEPENDENT_AMBULATORY_CARE_PROVIDER_SITE_OTHER): Payer: Managed Care, Other (non HMO)

## 2012-09-03 DIAGNOSIS — I10 Essential (primary) hypertension: Secondary | ICD-10-CM

## 2012-09-03 DIAGNOSIS — Z789 Other specified health status: Secondary | ICD-10-CM

## 2012-09-03 DIAGNOSIS — E876 Hypokalemia: Secondary | ICD-10-CM

## 2012-09-25 ENCOUNTER — Ambulatory Visit (INDEPENDENT_AMBULATORY_CARE_PROVIDER_SITE_OTHER): Payer: Managed Care, Other (non HMO) | Admitting: Internal Medicine

## 2012-09-25 ENCOUNTER — Other Ambulatory Visit (INDEPENDENT_AMBULATORY_CARE_PROVIDER_SITE_OTHER): Payer: Managed Care, Other (non HMO)

## 2012-09-25 ENCOUNTER — Encounter: Payer: Self-pay | Admitting: Internal Medicine

## 2012-09-25 VITALS — BP 132/80 | HR 80 | Temp 98.7°F | Resp 16 | Ht 64.0 in | Wt 167.0 lb

## 2012-09-25 DIAGNOSIS — I10 Essential (primary) hypertension: Secondary | ICD-10-CM

## 2012-09-25 DIAGNOSIS — E876 Hypokalemia: Secondary | ICD-10-CM

## 2012-09-25 DIAGNOSIS — H8309 Labyrinthitis, unspecified ear: Secondary | ICD-10-CM

## 2012-09-25 DIAGNOSIS — E7800 Pure hypercholesterolemia, unspecified: Secondary | ICD-10-CM

## 2012-09-25 DIAGNOSIS — R7309 Other abnormal glucose: Secondary | ICD-10-CM

## 2012-09-25 DIAGNOSIS — H9190 Unspecified hearing loss, unspecified ear: Secondary | ICD-10-CM

## 2012-09-25 DIAGNOSIS — Z1231 Encounter for screening mammogram for malignant neoplasm of breast: Secondary | ICD-10-CM

## 2012-09-25 LAB — CBC WITH DIFFERENTIAL/PLATELET
Basophils Absolute: 0 10*3/uL (ref 0.0–0.1)
Eosinophils Absolute: 0.1 10*3/uL (ref 0.0–0.7)
Eosinophils Relative: 2.4 % (ref 0.0–5.0)
HCT: 36.8 % (ref 36.0–46.0)
Lymphs Abs: 1.6 10*3/uL (ref 0.7–4.0)
MCHC: 33.3 g/dL (ref 30.0–36.0)
MCV: 84.5 fl (ref 78.0–100.0)
Monocytes Absolute: 0.4 10*3/uL (ref 0.1–1.0)
Neutrophils Relative %: 51.6 % (ref 43.0–77.0)
Platelets: 192 10*3/uL (ref 150.0–400.0)
RDW: 14.6 % (ref 11.5–14.6)
WBC: 4.4 10*3/uL — ABNORMAL LOW (ref 4.5–10.5)

## 2012-09-25 LAB — COMPREHENSIVE METABOLIC PANEL
AST: 18 U/L (ref 0–37)
Albumin: 4.1 g/dL (ref 3.5–5.2)
Alkaline Phosphatase: 53 U/L (ref 39–117)
Glucose, Bld: 90 mg/dL (ref 70–99)
Potassium: 3.8 mEq/L (ref 3.5–5.1)
Sodium: 137 mEq/L (ref 135–145)
Total Bilirubin: 0.5 mg/dL (ref 0.3–1.2)
Total Protein: 7.5 g/dL (ref 6.0–8.3)

## 2012-09-25 LAB — LIPID PANEL
HDL: 50.7 mg/dL (ref >39.00)
Triglycerides: 89 mg/dL (ref 0.0–149.0)
VLDL: 17.8 mg/dL (ref 0.0–40.0)

## 2012-09-25 LAB — HEMOGLOBIN A1C: Hgb A1c MFr Bld: 5.9 % (ref 4.6–6.5)

## 2012-09-25 LAB — TSH: TSH: 1.21 u[IU]/mL (ref 0.35–5.50)

## 2012-09-25 MED ORDER — METHYLPREDNISOLONE ACETATE 80 MG/ML IJ SUSP
120.0000 mg | Freq: Once | INTRAMUSCULAR | Status: AC
  Administered 2012-09-25: 120 mg via INTRAMUSCULAR

## 2012-09-25 NOTE — Patient Instructions (Signed)
Vertigo Vertigo means you feel like you or your surroundings are moving when they are not. Vertigo can be dangerous if it occurs when you are at work, driving, or performing difficult activities.  CAUSES  Vertigo occurs when there is a conflict of signals sent to your brain from the visual and sensory systems in your body. There are many different causes of vertigo, including:  Infections, especially in the inner ear.  A bad reaction to a drug or misuse of alcohol and medicines.  Withdrawal from drugs or alcohol.  Rapidly changing positions, such as lying down or rolling over in bed.  A migraine headache.  Decreased blood flow to the brain.  Increased pressure in the brain from a head injury, infection, tumor, or bleeding. SYMPTOMS  You may feel as though the world is spinning around or you are falling to the ground. Because your balance is upset, vertigo can cause nausea and vomiting. You may have involuntary eye movements (nystagmus). DIAGNOSIS  Vertigo is usually diagnosed by physical exam. If the cause of your vertigo is unknown, your caregiver may perform imaging tests, such as an MRI scan (magnetic resonance imaging). TREATMENT  Most cases of vertigo resolve on their own, without treatment. Depending on the cause, your caregiver may prescribe certain medicines. If your vertigo is related to body position issues, your caregiver may recommend movements or procedures to correct the problem. In rare cases, if your vertigo is caused by certain inner ear problems, you may need surgery. HOME CARE INSTRUCTIONS   Follow your caregiver's instructions.  Avoid driving.  Avoid operating heavy machinery.  Avoid performing any tasks that would be dangerous to you or others during a vertigo episode.  Tell your caregiver if you notice that certain medicines seem to be causing your vertigo. Some of the medicines used to treat vertigo episodes can actually make them worse in some people. SEEK  IMMEDIATE MEDICAL CARE IF:   Your medicines do not relieve your vertigo or are making it worse.  You develop problems with talking, walking, weakness, or using your arms, hands, or legs.  You develop severe headaches.  Your nausea or vomiting continues or gets worse.  You develop visual changes.  A family member notices behavioral changes.  Your condition gets worse. MAKE SURE YOU:  Understand these instructions.  Will watch your condition.  Will get help right away if you are not doing well or get worse. Document Released: 06/05/2005 Document Revised: 11/18/2011 Document Reviewed: 03/14/2011 [employer] Patient Information 2013 [employer].  

## 2012-09-25 NOTE — Progress Notes (Signed)
Subjective:    Patient ID: Anne [name], female    DOB: Oct 29, 1945, 67 y.o.   MRN: [id number]  HPI  New to me she complains of a 4 day history of dizziness, vertigo, and runny nose with post-nasal drip.  Review of Systems  Constitutional: Negative.   HENT: Positive for hearing loss, congestion, rhinorrhea and postnasal drip. Negative for ear pain, nosebleeds, sore throat, facial swelling, sneezing, trouble swallowing, neck pain, neck stiffness, voice change, sinus pressure, tinnitus and ear discharge.   Eyes: Negative.   Respiratory: Negative for cough, chest tightness, shortness of breath, wheezing and stridor.   Cardiovascular: Negative for chest pain, palpitations and leg swelling.  Gastrointestinal: Negative.   Genitourinary: Negative.   Musculoskeletal: Negative.   Skin: Negative.   Neurological: Positive for dizziness. Negative for tremors, seizures, syncope, facial asymmetry, speech difficulty, weakness, light-headedness, numbness and headaches.  Hematological: Negative for adenopathy. Does not bruise/bleed easily.  Psychiatric/Behavioral: Negative.        Objective:   Physical Exam  Vitals reviewed. Constitutional: She is oriented to person, place, and time. She appears well-developed and well-nourished.  Non-toxic appearance. She does not have a sickly appearance. She does not appear ill. No distress.  HENT:  Head: Normocephalic and atraumatic. No trismus in the jaw.  Right Ear: Hearing, tympanic membrane, external ear and ear canal normal.  Left Ear: Hearing, tympanic membrane, external ear and ear canal normal.  Nose: Mucosal edema and rhinorrhea present. No sinus tenderness or nasal deformity. Right sinus exhibits no maxillary sinus tenderness and no frontal sinus tenderness. Left sinus exhibits no maxillary sinus tenderness and no frontal sinus tenderness.  Mouth/Throat: Oropharynx is clear and moist. Mucous membranes are not pale, not dry and not cyanotic. No oral  lesions. No uvula swelling. No oropharyngeal exudate, posterior oropharyngeal edema, posterior oropharyngeal erythema or tonsillar abscesses.  Eyes: Conjunctivae normal and EOM are normal. Pupils are equal, round, and reactive to light. Right eye exhibits no discharge. Left eye exhibits no discharge. No scleral icterus.  Neck: Normal range of motion. Neck supple. No JVD present. No tracheal deviation present. No thyromegaly present.  Cardiovascular: Normal rate, regular rhythm, normal heart sounds and intact distal pulses.  Exam reveals no gallop and no friction rub.   No murmur heard. Pulmonary/Chest: Effort normal and breath sounds normal. No stridor. No respiratory distress. She has no wheezes. She has no rales. She exhibits no tenderness.  Abdominal: Soft. Bowel sounds are normal. She exhibits no distension and no mass. There is no tenderness. There is no rebound and no guarding.  Musculoskeletal: Normal range of motion. She exhibits no tenderness.  Lymphadenopathy:    She has no cervical adenopathy.  Neurological: She is alert and oriented to person, place, and time. She has normal strength. She displays no atrophy, no tremor and normal reflexes. No cranial nerve deficit or sensory deficit. She exhibits normal muscle tone. She displays a negative Romberg sign. She displays no seizure activity. Coordination and gait normal. She displays no Babinski's sign on the right side. She displays no Babinski's sign on the left side.  Reflex Scores:      Tricep reflexes are 1+ on the right side and 1+ on the left side.      Bicep reflexes are 1+ on the right side and 1+ on the left side.      Brachioradialis reflexes are 1+ on the right side and 1+ on the left side.      Patellar reflexes are 1+ on  the right side and 1+ on the left side.      Achilles reflexes are 1+ on the right side and 1+ on the left side. Skin: Skin is warm and dry. No rash noted. She is not diaphoretic. No erythema. No pallor.    Psychiatric: She has a normal mood and affect. Her behavior is normal. Judgment and thought content normal.     Lab Results  Component Value Date   WBC 4.0 10/31/2011   HGB 12.4 10/31/2011   HCT 36.1 10/31/2011   PLT 211 10/31/2011   GLUCOSE 100* 10/31/2011   ALT 13 10/01/2011   AST 17 10/01/2011   NA 136 10/31/2011   K 3.2* 10/31/2011   CL 99 10/31/2011   CREATININE 0.88 10/31/2011   BUN 13 10/31/2011   CO2 27 10/31/2011   TSH 4.258 10/01/2011   INR 0.99 10/01/2011   HGBA1C  Value: 6.5 (NOTE)                                                                       According to the ADA Clinical Practice Recommendations for 2011, when HbA1c is used as a screening test:   >=6.5%   Diagnostic of Diabetes Mellitus           (if abnormal result  is confirmed)  5.7-6.4%   Increased risk of developing Diabetes Mellitus  References:Diagnosis and Classification of Diabetes Mellitus,Diabetes Care,2011,34(Suppl 1):S62-S69 and Standards of Medical Care in         Diabetes - 2011,Diabetes Care,2011,34  (Suppl 1):S11-S61.* 03/13/2010       Assessment & Plan:

## 2012-09-27 ENCOUNTER — Encounter: Payer: Self-pay | Admitting: Internal Medicine

## 2012-09-27 DIAGNOSIS — Z1231 Encounter for screening mammogram for malignant neoplasm of breast: Secondary | ICD-10-CM | POA: Insufficient documentation

## 2012-09-27 DIAGNOSIS — H9190 Unspecified hearing loss, unspecified ear: Secondary | ICD-10-CM | POA: Insufficient documentation

## 2012-09-27 NOTE — Assessment & Plan Note (Signed)
Her BP is well controlled I will check her labs today to look for secondary causes and end organ damage

## 2012-09-27 NOTE — Assessment & Plan Note (Signed)
Audiology referral.

## 2012-09-27 NOTE — Assessment & Plan Note (Signed)
I think she would benefit from steroids

## 2012-09-27 NOTE — Assessment & Plan Note (Signed)
Repeat K+ level today

## 2012-09-27 NOTE — Assessment & Plan Note (Signed)
I will recheck her a1c today to see if she has developed DM II 

## 2012-09-27 NOTE — Assessment & Plan Note (Signed)
FLP today 

## 2012-10-07 ENCOUNTER — Ambulatory Visit (INDEPENDENT_AMBULATORY_CARE_PROVIDER_SITE_OTHER): Payer: Managed Care, Other (non HMO) | Admitting: Cardiology

## 2012-10-07 ENCOUNTER — Encounter: Payer: Self-pay | Admitting: Cardiology

## 2012-10-07 VITALS — BP 144/89 | HR 89 | Ht 64.0 in | Wt 165.0 lb

## 2012-10-07 DIAGNOSIS — I10 Essential (primary) hypertension: Secondary | ICD-10-CM

## 2012-10-07 DIAGNOSIS — E876 Hypokalemia: Secondary | ICD-10-CM

## 2012-10-07 MED ORDER — SPIRONOLACTONE 25 MG PO TABS: 25.0000 mg | ORAL_TABLET | Freq: Every day | ORAL | Status: DC

## 2012-10-07 NOTE — Patient Instructions (Signed)
Continue your current therapy.  Reduce your salt intake.  I will see you in 4 months.

## 2012-10-07 NOTE — Progress Notes (Signed)
[name] Date of Birth: [birth year] Medical Record #[id number]  History of Present Illness: [name] is seen for followup today of her hypertension. For details of her past medical history please refer to my note of 09/01/2012. She has a history of long-standing hypertension with multiple drug intolerances. On her last visit we started her on Aldactone 12.5 mg daily. She states she feels like a new person. She feels much better. Her blood pressure at home on average has been 132/80. She has had no side effects.  Current Outpatient Prescriptions on File Prior to Visit  Medication Sig Dispense Refill  . CALCIUM CITRATE PO Take 500 mg by mouth daily.       . cholecalciferol (VITAMIN D) 1000 UNITS tablet Take 1,000 Units by mouth daily.      Marland Kitchen spironolactone (ALDACTONE) 25 MG tablet Take 1 tablet (25 mg total) by mouth daily. Take 1/2 tab daily  90 tablet  3    Allergies  Allergen Reactions  . Norvasc (Amlodipine Besylate) Swelling    Severe edema   . Asa (Aspirin)     Causes stomach issues  . Diltiazem Hcl Other (See Comments)    Nausea and vomiting  . Diovan (Valsartan) Other (See Comments)    Unable to remember reaction  . Guanfacine Hcl   . Hytrin (Terazosin Hcl) Other (See Comments)    Pt states she can not take this med but can not relate any side effect or incident that occurred  . Lisinopril Other (See Comments)    Unknown, pt says she "can't take it", cough  . Lopressor (Metoprolol Tartrate) Other (See Comments)    hypotension  . Naproxen Nausea And Vomiting    Gastric intolerance  . Penicillins Itching  . Triamterene-Hctz Other (See Comments)    Acute renal failure, hypokalemia     Past Medical History  Diagnosis Date  . Hypertension   . Anemia   . Hiatal hernia   . Hypercholesterolemia   . GERD (gastroesophageal reflux disease)   . Vitamin D deficiency   . Osteopenia   . Diverticulosis     Past Surgical History  Procedure Date  . Abdominal  hysterectomy   . Fibrocystic breast excision   . Tubal ligation     History  Smoking status  . Never Smoker   Smokeless tobacco  . Never Used    History  Alcohol Use No    Family History  Problem Relation Age of Onset  . Stroke Mother   . Hypertension Mother   . Stroke Brother   . Cancer Neg Hx   . Alcohol abuse Neg Hx   . Early death Neg Hx   . Hearing loss Neg Hx   . Heart disease Neg Hx   . Hyperlipidemia Neg Hx   . Kidney disease Neg Hx     Review of Systems: The review of systems is remarkable for the fact that her urinary urgency has resolved. She has had no muscle spasms or swelling. Her extreme thirst has resolved. She denies any dyspnea or chest pain. All other systems were reviewed and are negative.  Physical Exam: BP 144/89  Pulse 89  Ht 5\' 4"  (1.626 m)  Wt 165 lb (74.844 kg)  BMI 28.32 kg/m2  She is a pleasant black female in no acute distress.The patient is alert and oriented x 3.   The HEENT exam is normal. Her or The carotids are 2+ without bruits.  There is no thyromegaly.  There  is no JVD.  The lungs are clear.   The heart exam reveals a regular rate with a normal S1 and S2.  There are no murmurs, gallops, or rubs.  The PMI is not displaced.   Abdominal exam reveals good bowel sounds.  There is no guarding or rebound.  There is no hepatosplenomegaly or tenderness.  There are no masses.  Exam of the legs reveal no clubbing, cyanosis, or edema.  The legs are without rashes.  The distal pulses are intact.  Cranial nerves II - XII are intact.  Motor and sensory functions are intact.  The gait is normal.   LABORATORY DATA: Lab Results  Component Value Date   WBC 4.4* 09/25/2012   HGB 12.2 09/25/2012   HCT 36.8 09/25/2012   PLT 192.0 09/25/2012   GLUCOSE 90 09/25/2012   CHOL 221* 09/25/2012   TRIG 89.0 09/25/2012   HDL 50.70 09/25/2012   LDLDIRECT 140.7 09/25/2012   ALT 14 09/25/2012   AST 18 09/25/2012   NA 137 09/25/2012   K 3.8 09/25/2012   CL 102 09/25/2012     CREATININE 1.0 09/25/2012   BUN 19 09/25/2012   CO2 30 09/25/2012   TSH 1.21 09/25/2012   INR 0.99 10/01/2011   HGBA1C 5.9 09/25/2012   Renal duplex study was normal.  Assessment / Plan: 1. Hypertension, controlled. Fairly dramatic response to low-dose of Aldactone. She is tolerating this well. We will continue with his therapy and continue with sodium restriction and hard healthy diet. I'll followup again in 4 months.  2. Hypercholesterolemia.  LDL is moderately elevated. With her history of drug intolerances I would avoid medication at this time and focus on lifestyle modification with dietary measures, exercise, and weight loss.

## 2012-10-16 ENCOUNTER — Ambulatory Visit: Payer: Managed Care, Other (non HMO) | Admitting: Internal Medicine

## 2012-10-21 ENCOUNTER — Ambulatory Visit: Payer: Managed Care, Other (non HMO) | Admitting: Internal Medicine

## 2012-10-22 ENCOUNTER — Ambulatory Visit: Payer: Managed Care, Other (non HMO) | Admitting: Internal Medicine

## 2012-10-23 ENCOUNTER — Ambulatory Visit (HOSPITAL_COMMUNITY): Payer: Managed Care, Other (non HMO)

## 2012-10-29 ENCOUNTER — Ambulatory Visit (HOSPITAL_COMMUNITY)
Admission: RE | Admit: 2012-10-29 | Discharge: 2012-10-29 | Disposition: A | Discharge status: Home / Self Care | Payer: Managed Care, Other (non HMO) | Source: Ambulatory Visit | Attending: Internal Medicine | Admitting: Internal Medicine

## 2012-10-29 DIAGNOSIS — Z1231 Encounter for screening mammogram for malignant neoplasm of breast: Secondary | ICD-10-CM | POA: Insufficient documentation

## 2013-04-13 ENCOUNTER — Ambulatory Visit (INDEPENDENT_AMBULATORY_CARE_PROVIDER_SITE_OTHER): Payer: Medicare Other | Admitting: Internal Medicine

## 2013-04-13 ENCOUNTER — Other Ambulatory Visit (INDEPENDENT_AMBULATORY_CARE_PROVIDER_SITE_OTHER): Payer: Medicare Other

## 2013-04-13 ENCOUNTER — Encounter: Payer: Self-pay | Admitting: Internal Medicine

## 2013-04-13 VITALS — BP 136/80 | HR 72 | Temp 98.4°F | Wt 158.0 lb

## 2013-04-13 DIAGNOSIS — R5383 Other fatigue: Secondary | ICD-10-CM

## 2013-04-13 DIAGNOSIS — I10 Essential (primary) hypertension: Secondary | ICD-10-CM

## 2013-04-13 DIAGNOSIS — IMO0001 Reserved for inherently not codable concepts without codable children: Secondary | ICD-10-CM

## 2013-04-13 DIAGNOSIS — R531 Weakness: Secondary | ICD-10-CM

## 2013-04-13 DIAGNOSIS — R5381 Other malaise: Secondary | ICD-10-CM

## 2013-04-13 LAB — CBC
HCT: 38.6 % (ref 36.0–46.0)
Hemoglobin: 12.9 g/dL (ref 12.0–15.0)
MCHC: 33.4 g/dL (ref 30.0–36.0)
MCV: 85.1 fl (ref 78.0–100.0)
Platelets: 196 10*3/uL (ref 150.0–400.0)
RBC: 4.54 Mil/uL (ref 3.87–5.11)
RDW: 14.6 % (ref 11.5–14.6)
WBC: 4.8 10*3/uL (ref 4.5–10.5)

## 2013-04-13 LAB — BASIC METABOLIC PANEL
BUN: 10 mg/dL (ref 6–23)
Chloride: 103 mEq/L (ref 96–112)
Creatinine, Ser: 1 mg/dL (ref 0.4–1.2)
GFR: 73.2 mL/min (ref >60.00)
Glucose, Bld: 96 mg/dL (ref 70–99)
Potassium: 4 mEq/L (ref 3.5–5.1)

## 2013-04-13 LAB — CK: Total CK: 102 U/L (ref 7–177)

## 2013-04-13 LAB — TSH: TSH: 1.39 u[IU]/mL (ref 0.35–5.50)

## 2013-04-13 NOTE — Progress Notes (Signed)
Subjective:    Patient ID: Anne [name], female    DOB: 1945-09-19, 67 y.o.   MRN: [id number]  HPI  Pt presents to the clinic today with c/o cramping in her fingers and toes x 2 weeks. She has some associated weakness, fatigue and dizziness. She denies recent cold like symptoms, chest pain, chest tightness or shortness of breath. She does have a history of HTN and she thinks her potassium may be low. She is on Aldactone 12.5 mg daily. She is drinking 8 glasses of water per day. She has had a good blood pressure response with this. She was supposed to follow up with Dr. [place] in 01/2013, but she never made an appointment. She has had a history of hypokalemia and ARF in the past with HCTZ. Nothing makes the symptoms better or worse. She reports that they are constant. She is concerned because last time she had symptoms like this, she ended of in the hospital.  Review of Systems       Past Medical History  Diagnosis Date  . Hypertension   . Anemia   . Hiatal hernia   . Hypercholesterolemia   . GERD (gastroesophageal reflux disease)   . Vitamin D deficiency   . Osteopenia   . Diverticulosis     Current Outpatient Prescriptions  Medication Sig Dispense Refill  . CALCIUM CITRATE PO Take 500 mg by mouth daily.       . cholecalciferol (VITAMIN D) 1000 UNITS tablet Take 1,000 Units by mouth daily.      . magnesium oxide (MAG-OX) 400 MG tablet PT is taking 500 mg of magnesuim      . spironolactone (ALDACTONE) 25 MG tablet Take 1 tablet (25 mg total) by mouth daily. Take 1/2 tab daily  90 tablet  3   No current facility-administered medications for this visit.    Allergies  Allergen Reactions  . Norvasc (Amlodipine Besylate) Swelling    Severe edema   . Asa (Aspirin)     Causes stomach issues  . Diltiazem Hcl Other (See Comments)    Nausea and vomiting  . Diovan (Valsartan) Other (See Comments)    Unable to remember reaction  . Guanfacine Hcl   . Hytrin (Terazosin Hcl) Other  (See Comments)    Pt states she can not take this med but can not relate any side effect or incident that occurred  . Lisinopril Other (See Comments)    Unknown, pt says she "can't take it", cough  . Lopressor (Metoprolol Tartrate) Other (See Comments)    hypotension  . Naproxen Nausea And Vomiting    Gastric intolerance  . Penicillins Itching  . Triamterene-Hctz Other (See Comments)    Acute renal failure, hypokalemia     Family History  Problem Relation Age of Onset  . Stroke Mother   . Hypertension Mother   . Stroke Brother   . Cancer Neg Hx   . Alcohol abuse Neg Hx   . Early death Neg Hx   . Hearing loss Neg Hx   . Heart disease Neg Hx   . Hyperlipidemia Neg Hx   . Kidney disease Neg Hx     History   Social History  . Marital Status: Married    Spouse Name: N/A    Number of Children: 2  . Years of Education: N/A   Occupational History  . retired    Social History Main Topics  . Smoking status: Never Smoker   . Smokeless tobacco: Never Used  .  Alcohol Use: No  . Drug Use: No  . Sexually Active: Yes    Birth Control/ Protection: Surgical   Other Topics Concern  . Not on file   Social History Narrative  . No narrative on file     Constitutional: Pt reports fatigue. Denies fever, malaise, headache or abrupt weight changes.  Respiratory: Denies difficulty breathing, shortness of breath, cough or sputum production.   Cardiovascular: Denies chest pain, chest tightness, palpitations or swelling in the hands or feet.   Neurological: Pt reports dizziness. Denies difficulty with memory, difficulty with speech or problems with balance and coordination.   No other specific complaints in a complete review of systems (except as listed in HPI above).  Objective:   Physical Exam   BP 136/80  Pulse 72  Temp(Src) 98.4 F (36.9 C) (Oral)  Wt 158 lb (71.668 kg)  BMI 27.11 kg/m2  SpO2 98% Wt Readings from Last 3 Encounters:  04/13/13 158 lb (71.668 kg)   10/07/12 165 lb (74.844 kg)  09/25/12 167 lb (75.751 kg)    General: Appears her stated age, well developed, well nourished in NAD. HEENT: Head: normal shape and size; Eyes: sclera white, no icterus, conjunctiva pink, PERRLA and EOMs intact; Ears: Tm's gray and intact, normal light reflex; Nose: mucosa pink and moist, septum midline; Throat/Mouth: Teeth present, mucosa pink and moist, no exudate, lesions or ulcerations noted.   Cardiovascular: Normal rate and rhythm. S1,S2 noted.  No murmur, rubs or gallops noted. No JVD or BLE edema. No carotid bruits noted. Pulmonary/Chest: Normal effort and positive vesicular breath sounds. No respiratory distress. No wheezes, rales or ronchi noted.  Neurological: Alert and oriented. Cranial nerves II-XII intact. Coordination normal. +DTRs bilaterally. Negative Romberg.   BMET    Component Value Date/Time   NA 137 09/25/2012 1559   K 3.8 09/25/2012 1559   CL 102 09/25/2012 1559   CO2 30 09/25/2012 1559   GLUCOSE 90 09/25/2012 1559   BUN 19 09/25/2012 1559   CREATININE 1.0 09/25/2012 1559   CREATININE 1.34* 03/19/2010 0936   CALCIUM 9.5 09/25/2012 1559   GFRNONAA 68* 10/31/2011 0059   GFRAA 79* 10/31/2011 0059    Lipid Panel     Component Value Date/Time   CHOL 221* 09/25/2012 1559   TRIG 89.0 09/25/2012 1559   HDL 50.70 09/25/2012 1559   CHOLHDL 4 09/25/2012 1559   VLDL 17.8 09/25/2012 1559    CBC    Component Value Date/Time   WBC 4.4* 09/25/2012 1559   WBC 5.0 03/16/2010 1520   RBC 4.35 09/25/2012 1559   RBC 4.17 10/01/2011 0500   RBC 3.66* 03/16/2010 1520   HGB 12.2 09/25/2012 1559   HGB 10.0* 03/16/2010 1520   HCT 36.8 09/25/2012 1559   HCT 29.1* 03/16/2010 1520   PLT 192.0 09/25/2012 1559   PLT 148 03/16/2010 1520   MCV 84.5 09/25/2012 1559   MCV 79.5 03/16/2010 1520   MCH 27.9 10/31/2011 0059   MCH 27.3 03/16/2010 1520   MCHC 33.3 09/25/2012 1559   MCHC 34.4 03/16/2010 1520   RDW 14.6 09/25/2012 1559   RDW 14.9* 03/16/2010 1520   LYMPHSABS 1.6 09/25/2012  1559   LYMPHSABS 1.5 03/16/2010 1520   MONOABS 0.4 09/25/2012 1559   MONOABS 0.3 03/16/2010 1520   EOSABS 0.1 09/25/2012 1559   EOSABS 0.1 03/16/2010 1520   BASOSABS 0.0 09/25/2012 1559   BASOSABS 0.0 03/16/2010 1520    Hgb A1C Lab Results  Component Value Date   HGBA1C  5.9 09/25/2012        Assessment & Plan:   Fatigue, dizziness, muscle aches, new onset with additional workup required:  Will check BMET to r/o dehydration, hypokalemia Will check MG to r/o hypomagnesia Will check CK to r/o myolosis Will check TSH to assess fatigue  Will f/u after labs are back. RTC if symptoms persist or worsen.

## 2013-04-13 NOTE — Patient Instructions (Signed)

## 2013-04-26 ENCOUNTER — Encounter: Payer: Medicare Other | Admitting: Internal Medicine

## 2013-04-26 DIAGNOSIS — Z0289 Encounter for other administrative examinations: Secondary | ICD-10-CM

## 2013-09-30 ENCOUNTER — Other Ambulatory Visit (INDEPENDENT_AMBULATORY_CARE_PROVIDER_SITE_OTHER): Payer: Medicare Other

## 2013-09-30 ENCOUNTER — Encounter: Payer: Self-pay | Admitting: Internal Medicine

## 2013-09-30 ENCOUNTER — Ambulatory Visit (INDEPENDENT_AMBULATORY_CARE_PROVIDER_SITE_OTHER): Payer: Medicare Other | Admitting: Internal Medicine

## 2013-09-30 VITALS — BP 170/88 | HR 77 | Temp 97.7°F | Resp 16 | Ht 64.0 in | Wt 163.0 lb

## 2013-09-30 DIAGNOSIS — IMO0001 Reserved for inherently not codable concepts without codable children: Secondary | ICD-10-CM

## 2013-09-30 DIAGNOSIS — I10 Essential (primary) hypertension: Secondary | ICD-10-CM

## 2013-09-30 DIAGNOSIS — Z23 Encounter for immunization: Secondary | ICD-10-CM

## 2013-09-30 DIAGNOSIS — E7800 Pure hypercholesterolemia, unspecified: Secondary | ICD-10-CM

## 2013-09-30 DIAGNOSIS — E876 Hypokalemia: Secondary | ICD-10-CM

## 2013-09-30 DIAGNOSIS — T466X5A Adverse effect of antihyperlipidemic and antiarteriosclerotic drugs, initial encounter: Secondary | ICD-10-CM | POA: Insufficient documentation

## 2013-09-30 LAB — CBC WITH DIFFERENTIAL/PLATELET
Basophils Absolute: 0 10*3/uL (ref 0.0–0.1)
Basophils Relative: 0.6 % (ref 0.0–3.0)
EOS PCT: 1.2 % (ref 0.0–5.0)
Eosinophils Absolute: 0.1 10*3/uL (ref 0.0–0.7)
HCT: 38.4 % (ref 36.0–46.0)
Hemoglobin: 13.1 g/dL (ref 12.0–15.0)
LYMPHS PCT: 36.2 % (ref 12.0–46.0)
Lymphs Abs: 1.7 10*3/uL (ref 0.7–4.0)
MCHC: 34.1 g/dL (ref 30.0–36.0)
MCV: 83.1 fl (ref 78.0–100.0)
MONO ABS: 0.3 10*3/uL (ref 0.1–1.0)
Monocytes Relative: 5.6 % (ref 3.0–12.0)
NEUTROS PCT: 56.4 % (ref 43.0–77.0)
Neutro Abs: 2.6 10*3/uL (ref 1.4–7.7)
PLATELETS: 191 10*3/uL (ref 150.0–400.0)
RBC: 4.61 Mil/uL (ref 3.87–5.11)
RDW: 14.1 % (ref 11.5–14.6)
WBC: 4.6 10*3/uL (ref 4.5–10.5)

## 2013-09-30 LAB — CK: Total CK: 106 U/L (ref 7–177)

## 2013-09-30 LAB — BASIC METABOLIC PANEL
BUN: 15 mg/dL (ref 6–23)
CHLORIDE: 104 meq/L (ref 96–112)
CO2: 29 mEq/L (ref 19–32)
CREATININE: 1.1 mg/dL (ref 0.4–1.2)
Calcium: 9.7 mg/dL (ref 8.4–10.5)
GFR: 67.5 mL/min (ref >60.00)
Glucose, Bld: 87 mg/dL (ref 70–99)
Potassium: 3.9 mEq/L (ref 3.5–5.1)
Sodium: 140 mEq/L (ref 135–145)

## 2013-09-30 LAB — SEDIMENTATION RATE: Sed Rate: 16 mm/hr (ref 0–22)

## 2013-09-30 LAB — LIPID PANEL
CHOL/HDL RATIO: 4
CHOLESTEROL: 225 mg/dL — AB (ref 0–200)
HDL: 57.2 mg/dL (ref >39.00)
Triglycerides: 69 mg/dL (ref 0.0–149.0)
VLDL: 13.8 mg/dL (ref 0.0–40.0)

## 2013-09-30 LAB — MAGNESIUM: Magnesium: 2 mg/dL (ref 1.5–2.5)

## 2013-09-30 LAB — URINALYSIS, ROUTINE W REFLEX MICROSCOPIC
Bilirubin Urine: NEGATIVE
Ketones, ur: NEGATIVE
Leukocytes, UA: NEGATIVE
NITRITE: NEGATIVE
PH: 6.5 (ref 5.0–8.0)
SPECIFIC GRAVITY, URINE: 1.01 (ref 1.000–1.030)
TOTAL PROTEIN, URINE-UPE24: NEGATIVE
URINE GLUCOSE: NEGATIVE
Urobilinogen, UA: 0.2 (ref 0.0–1.0)

## 2013-09-30 LAB — LDL CHOLESTEROL, DIRECT: LDL DIRECT: 158.7 mg/dL

## 2013-09-30 LAB — C-REACTIVE PROTEIN: CRP: <0.5 mg/dL (ref 0.5–20.0)

## 2013-09-30 LAB — TSH: TSH: 1.43 u[IU]/mL (ref 0.35–5.50)

## 2013-09-30 MED ORDER — NEBIVOLOL HCL 10 MG PO TABS: 10.0000 mg | ORAL_TABLET | Freq: Every day | ORAL | Status: DC

## 2013-09-30 NOTE — Progress Notes (Signed)
   Subjective:    Patient ID: Anne [name], female    DOB: December 24, 1945, 68 y.o.   MRN: [id number]  Hypertension This is a chronic problem. The current episode started more than 1 year ago. The problem has been gradually worsening since onset. The problem is uncontrolled. Pertinent negatives include no anxiety, blurred vision, chest pain, headaches, malaise/fatigue, neck pain, orthopnea, palpitations, peripheral edema, PND, shortness of breath or sweats. There are no associated agents to hypertension. Past treatments include diuretics. The current treatment provides mild improvement. Compliance problems include diet and exercise.       Review of Systems  Constitutional: Negative.  Negative for fever, chills, malaise/fatigue, diaphoresis, appetite change and fatigue.  HENT: Negative.   Eyes: Negative.  Negative for blurred vision.  Respiratory: Negative.  Negative for cough, choking, chest tightness, shortness of breath, wheezing and stridor.   Cardiovascular: Negative.  Negative for chest pain, palpitations, orthopnea, leg swelling and PND.  Gastrointestinal: Negative.  Negative for nausea, vomiting, abdominal pain, diarrhea, constipation and blood in stool.  Endocrine: Negative.   Genitourinary: Negative.   Musculoskeletal: Positive for arthralgias and myalgias. Negative for back pain, gait problem, joint swelling, neck pain and neck stiffness.  Skin: Negative.   Allergic/Immunologic: Negative.   Neurological: Negative for dizziness, tremors, syncope, weakness, light-headedness, numbness and headaches.  Hematological: Negative.  Negative for adenopathy. Does not bruise/bleed easily.  Psychiatric/Behavioral: Negative.        Objective:   Physical Exam  Vitals reviewed. Constitutional: She is oriented to person, place, and time. She appears well-developed and well-nourished. No distress.  HENT:  Head: Normocephalic and atraumatic.  Mouth/Throat: Oropharynx is clear and moist. No  oropharyngeal exudate.  Eyes: Conjunctivae are normal. Right eye exhibits no discharge. Left eye exhibits no discharge. No scleral icterus.  Neck: Normal range of motion. Neck supple. No JVD present. No tracheal deviation present. No thyromegaly present.  Cardiovascular: Normal rate, regular rhythm, normal heart sounds and intact distal pulses.  Exam reveals no gallop and no friction rub.   No murmur heard. Pulmonary/Chest: Effort normal and breath sounds normal. No stridor. No respiratory distress. She has no wheezes. She has no rales. She exhibits no tenderness.  Abdominal: Soft. Bowel sounds are normal. She exhibits no distension and no mass. There is no tenderness. There is no rebound and no guarding.  Musculoskeletal: Normal range of motion. She exhibits no edema and no tenderness.  Lymphadenopathy:    She has no cervical adenopathy.  Neurological: She is oriented to person, place, and time.  Skin: Skin is warm and dry. No rash noted. She is not diaphoretic. No erythema. No pallor.  Psychiatric: She has a normal mood and affect. Her behavior is normal. Judgment and thought content normal.     Lab Results  Component Value Date   WBC 4.8 04/13/2013   HGB 12.9 04/13/2013   HCT 38.6 04/13/2013   PLT 196.0 04/13/2013   GLUCOSE 96 04/13/2013   CHOL 221* 09/25/2012   TRIG 89.0 09/25/2012   HDL 50.70 09/25/2012   LDLDIRECT 140.7 09/25/2012   ALT 14 09/25/2012   AST 18 09/25/2012   NA 139 04/13/2013   K 4.0 04/13/2013   CL 103 04/13/2013   CREATININE 1.0 04/13/2013   BUN 10 04/13/2013   CO2 27 04/13/2013   TSH 1.39 04/13/2013   INR 0.99 10/01/2011   HGBA1C 5.9 09/25/2012       Assessment & Plan:

## 2013-09-30 NOTE — Progress Notes (Signed)
Pre visit review using our clinic review tool, if applicable. No additional management support is needed unless otherwise documented below in the visit note. 

## 2013-09-30 NOTE — Patient Instructions (Signed)

## 2013-10-01 ENCOUNTER — Encounter: Payer: Self-pay | Admitting: Internal Medicine

## 2013-10-01 NOTE — Assessment & Plan Note (Signed)
I will check her FLP and will treat if needed

## 2013-10-01 NOTE — Assessment & Plan Note (Addendum)
I will check her labs to see if she has an inflammatory condition, myopathy/myositis, anemia, thyroid disease

## 2013-10-01 NOTE — Assessment & Plan Note (Signed)
Her BP is not well controlled I have asked her to add bystolic to the diuretic Her EKG is normal (NO LVH) Today I will check her labs to look for secondary causes of HTN and for end organ damage

## 2013-10-28 ENCOUNTER — Ambulatory Visit: Payer: Medicare Other | Admitting: Internal Medicine

## 2013-10-28 DIAGNOSIS — Z0289 Encounter for other administrative examinations: Secondary | ICD-10-CM

## 2013-11-17 ENCOUNTER — Telehealth: Payer: Self-pay | Admitting: Cardiology

## 2013-11-17 NOTE — Telephone Encounter (Signed)
Returned call to patient she stated for the past 2 days she has been dizzy.Stated she checked her B/P 184/101.Today 180/99,178/99.Stated she feels bad would like to be seen as soon as possible.Dr.Jordan out of office this week.Extenders schedules full.Appointment scheduled with Dr.Jordan 12/07/13 at 11:45 am.Advised to see PCP.Stated she will go to ER.

## 2013-11-17 NOTE — Telephone Encounter (Signed)
Returned call to pa

## 2013-11-17 NOTE — Telephone Encounter (Signed)
New Message:  Pt is c/o her BP - 184/101 yesterday.... Today 180/99... After resting today - 178/99... Pt is requesting to see Dr. Martinique asap... Pt did not want to wait until the next PA/Jordan appt.

## 2013-11-24 ENCOUNTER — Telehealth: Payer: Self-pay

## 2013-11-24 MED ORDER — SPIRONOLACTONE 25 MG PO TABS: 25.0000 mg | ORAL_TABLET | Freq: Every day | ORAL | Status: DC

## 2013-11-24 NOTE — Telephone Encounter (Signed)
Spoke to patient she stated she decided not to go to ER.Stated B/P is better 140/90.Stated she started drinking watermelon tea every day and B/P is coming down.Advised to keep appointment with Dr.Jordan 12/07/13.

## 2013-12-03 ENCOUNTER — Telehealth: Payer: Self-pay | Admitting: Cardiology

## 2013-12-03 NOTE — Telephone Encounter (Signed)
New Prob    Pt states she has some concerns regarding her BP and scheduling a sooner appt with D. Martinique. Please call.

## 2013-12-03 NOTE — Telephone Encounter (Signed)
Returned call to patient she stated someone from our office called her this morning and told her 12/07/13 appointment with Dr.Jordan needed to be rescheduled.Stated at the time she told them that would be ok, but stated she needs to see Dr.Jordan soon regarding her elevated B/P.Stated she is feeling ok but would like to see Dr.Jordan.Appointment scheduled with Dr.Jordan 12/07/13 at 8:15 am

## 2013-12-07 ENCOUNTER — Ambulatory Visit: Payer: Medicare Other | Admitting: Cardiology

## 2013-12-07 ENCOUNTER — Ambulatory Visit (INDEPENDENT_AMBULATORY_CARE_PROVIDER_SITE_OTHER): Payer: Medicare Other | Admitting: Cardiology

## 2013-12-07 ENCOUNTER — Encounter: Payer: Self-pay | Admitting: Cardiology

## 2013-12-07 VITALS — BP 154/84 | HR 82 | Ht 64.0 in | Wt 170.8 lb

## 2013-12-07 DIAGNOSIS — I10 Essential (primary) hypertension: Secondary | ICD-10-CM

## 2013-12-07 DIAGNOSIS — E7800 Pure hypercholesterolemia, unspecified: Secondary | ICD-10-CM

## 2013-12-07 MED ORDER — NEBIVOLOL HCL 2.5 MG PO TABS: 2.5000 mg | ORAL_TABLET | Freq: Every day | ORAL | Status: DC

## 2013-12-07 MED ORDER — SPIRONOLACTONE 25 MG PO TABS: 12.5000 mg | ORAL_TABLET | Freq: Every day | ORAL | Status: DC

## 2013-12-07 NOTE — Progress Notes (Signed)
Anne Anne [name] Date of Birth: February 13, 1946 Medical Record #[id number]  History of Present Illness: Anne Anne [name] is seen for followup today of her hypertension. Was last seen in Jan 2014. She has a history of long-standing hypertension with multiple drug intolerances. On a prior visit we started her on Aldactone 12.5 mg daily. This initially had a great result with control of BP without side effects. This year she states her BP started going up again as high as 193/103. She increased the aldactone which has helped some but BP remains elevated. She complains that the increased dose of aldactone has made her thirsty all the time with a dry throat. She also complains of hair loss. She feels dizzy a lot. She is not exercising as much. She has been taking a juice made from watermelon seed extract. She is fearful of taking BP meds as they are "addictive".    Current Outpatient Prescriptions on File Prior to Visit  Medication Sig Dispense Refill  . cholecalciferol (VITAMIN D) 1000 UNITS tablet Take 1,000 Units by mouth daily.       No current facility-administered medications on file prior to visit.    Allergies  Allergen Reactions  . Norvasc [Amlodipine Besylate] Swelling    Severe edema   . Asa [Aspirin]     Causes stomach issues  . Diltiazem Hcl Other (See Comments)    Nausea and vomiting  . Guanfacine Hcl   . Hytrin [Terazosin Hcl] Other (See Comments)    Pt states she can not take this med but can not relate any side effect or incident that occurred  . Lisinopril Other (See Comments)    Unknown, pt says she "can't take it", cough  . Naproxen Nausea And Vomiting    Gastric intolerance  . Penicillins Itching  . Triamterene-Hctz Other (See Comments)    Acute renal failure, hypokalemia     Past Medical History  Diagnosis Date  . Hypertension   . Anemia   . Hiatal hernia   . Hypercholesterolemia   . GERD (gastroesophageal reflux disease)   . Vitamin D deficiency   . Osteopenia    . Diverticulosis     Past Surgical History  Procedure Laterality Date  . Abdominal hysterectomy    . Fibrocystic breast excision    . Tubal ligation      History  Smoking status  . Never Smoker   Smokeless tobacco  . Never Used    History  Alcohol Use No    Family History  Problem Relation Age of Onset  . Stroke Mother   . Hypertension Mother   . Stroke Brother   . Cancer Neg Hx   . Alcohol abuse Neg Hx   . Early death Neg Hx   . Hearing loss Neg Hx   . Heart disease Neg Hx   . Hyperlipidemia Neg Hx   . Kidney disease Neg Hx     Review of Systems: The review of systems is as noted in HPI. She denies any dyspnea or chest pain. All other systems were reviewed and are negative.  Physical Exam: BP 154/84  Pulse 82  Ht 5\' 4"  (1.626 m)  Wt 170 lb 12.8 oz (77.474 kg)  BMI 29.30 kg/m2  She is a  black female in no acute distress. The HEENT exam is normal. Her or The carotids are 2+ without bruits.  There is no thyromegaly.  There is no JVD.  The lungs are clear.   The heart exam reveals a  regular rate with a normal S1 and S2.  There are no murmurs, gallops, or rubs.  The PMI is not displaced.   Abdominal exam reveals good bowel sounds.   There is no hepatosplenomegaly or tenderness.  There are no masses.  Exam of the legs reveal no clubbing, cyanosis, or edema.  The legs are without rashes.  The distal pulses are intact.  Cranial nerves II - XII are intact.  Motor and sensory functions are intact.  The gait is normal.   LABORATORY DATA: Lab Results  Component Value Date   WBC 4.6 09/30/2013   HGB 13.1 09/30/2013   HCT 38.4 09/30/2013   PLT 191.0 09/30/2013   GLUCOSE 87 09/30/2013   CHOL 225* 09/30/2013   TRIG 69.0 09/30/2013   HDL 57.20 09/30/2013   LDLDIRECT 158.7 09/30/2013   ALT 14 09/25/2012   AST 18 09/25/2012   NA 140 09/30/2013   K 3.9 09/30/2013   CL 104 09/30/2013   CREATININE 1.1 09/30/2013   BUN 15 09/30/2013   CO2 29 09/30/2013   TSH 1.43 09/30/2013   INR 0.99  10/01/2011   HGBA1C 5.9 09/25/2012   Renal duplex study in the past was normal.  Ecg: 09/30/13: normal.  Assessment / Plan: 1. Hypertension, poorly controlled. Multiple drug intolerances. It is noteworthy that she had complained of significant thirst in the past before starting Aldactone. I am not convinced that many of her "side effects" are really medication related. Significant bias against taking pills but willing to take a number of herbal remedies of questionable benefit. I have tried to clarify goals to control blood pressure to reduce her risk of stroke, heart disease and kidney disease. I have recommended a trial of Anne [name] dose Bystolic 2.5 mg daily will reduce aldactone toe 12.5 mg daily. Follow up in 2 months. Compliance with Anne [name] sodium diet and exercise stressed.  2. Hypercholesterolemia.  LDL is moderately elevated. With her history of drug intolerances I would avoid medication at this time and focus on lifestyle modification with dietary measures, exercise, and weight loss.

## 2013-12-07 NOTE — Patient Instructions (Signed)
We will try Bystolic 2.5 mg daily  You can try and reduce aldactone to 12.5 mg daily.  I will see you in 2 months.

## 2014-02-14 ENCOUNTER — Ambulatory Visit: Payer: Medicare Other | Admitting: Cardiology

## 2014-02-15 ENCOUNTER — Encounter: Payer: Self-pay | Admitting: Cardiology

## 2014-04-06 ENCOUNTER — Emergency Department (HOSPITAL_COMMUNITY): Payer: Medicare Other

## 2014-04-06 ENCOUNTER — Encounter (HOSPITAL_COMMUNITY): Payer: Self-pay | Admitting: Emergency Medicine

## 2014-04-06 ENCOUNTER — Emergency Department (HOSPITAL_COMMUNITY)
Admission: EM | Admit: 2014-04-06 | Discharge: 2014-04-06 | Disposition: A | Discharge status: Home / Self Care | Payer: Medicare Other | Attending: Emergency Medicine | Admitting: Emergency Medicine

## 2014-04-06 DIAGNOSIS — Z79899 Other long term (current) drug therapy: Secondary | ICD-10-CM | POA: Insufficient documentation

## 2014-04-06 DIAGNOSIS — R1110 Vomiting, unspecified: Secondary | ICD-10-CM | POA: Insufficient documentation

## 2014-04-06 DIAGNOSIS — I10 Essential (primary) hypertension: Secondary | ICD-10-CM | POA: Diagnosis not present

## 2014-04-06 DIAGNOSIS — M79642 Pain in left hand: Secondary | ICD-10-CM

## 2014-04-06 DIAGNOSIS — R42 Dizziness and giddiness: Secondary | ICD-10-CM | POA: Insufficient documentation

## 2014-04-06 DIAGNOSIS — Z9851 Tubal ligation status: Secondary | ICD-10-CM | POA: Insufficient documentation

## 2014-04-06 DIAGNOSIS — Z8719 Personal history of other diseases of the digestive system: Secondary | ICD-10-CM | POA: Insufficient documentation

## 2014-04-06 DIAGNOSIS — G8929 Other chronic pain: Secondary | ICD-10-CM | POA: Diagnosis not present

## 2014-04-06 DIAGNOSIS — M79609 Pain in unspecified limb: Secondary | ICD-10-CM | POA: Diagnosis not present

## 2014-04-06 DIAGNOSIS — R1032 Left lower quadrant pain: Secondary | ICD-10-CM | POA: Insufficient documentation

## 2014-04-06 DIAGNOSIS — Z8739 Personal history of other diseases of the musculoskeletal system and connective tissue: Secondary | ICD-10-CM | POA: Diagnosis not present

## 2014-04-06 DIAGNOSIS — M549 Dorsalgia, unspecified: Secondary | ICD-10-CM | POA: Insufficient documentation

## 2014-04-06 DIAGNOSIS — R1031 Right lower quadrant pain: Secondary | ICD-10-CM | POA: Insufficient documentation

## 2014-04-06 DIAGNOSIS — E559 Vitamin D deficiency, unspecified: Secondary | ICD-10-CM | POA: Diagnosis not present

## 2014-04-06 DIAGNOSIS — M79641 Pain in right hand: Secondary | ICD-10-CM

## 2014-04-06 DIAGNOSIS — Z90710 Acquired absence of both cervix and uterus: Secondary | ICD-10-CM | POA: Insufficient documentation

## 2014-04-06 DIAGNOSIS — Z880 Allergy status to penicillin: Secondary | ICD-10-CM | POA: Diagnosis not present

## 2014-04-06 DIAGNOSIS — Z862 Personal history of diseases of the blood and blood-forming organs and certain disorders involving the immune mechanism: Secondary | ICD-10-CM | POA: Insufficient documentation

## 2014-04-06 LAB — CBC WITH DIFFERENTIAL/PLATELET
Basophils Absolute: 0 10*3/uL (ref 0.0–0.1)
Basophils Relative: 0 % (ref 0–1)
EOS ABS: 0.1 10*3/uL (ref 0.0–0.7)
Eosinophils Relative: 3 % (ref 0–5)
HCT: 36.9 % (ref 36.0–46.0)
HEMOGLOBIN: 12.5 g/dL (ref 12.0–15.0)
LYMPHS ABS: 1.3 10*3/uL (ref 0.7–4.0)
LYMPHS PCT: 33 % (ref 12–46)
MCH: 28.7 pg (ref 26.0–34.0)
MCHC: 33.9 g/dL (ref 30.0–36.0)
MCV: 84.6 fL (ref 78.0–100.0)
MONOS PCT: 7 % (ref 3–12)
Monocytes Absolute: 0.3 10*3/uL (ref 0.1–1.0)
NEUTROS PCT: 57 % (ref 43–77)
Neutro Abs: 2.2 10*3/uL (ref 1.7–7.7)
Platelets: 174 10*3/uL (ref 150–400)
RBC: 4.36 MIL/uL (ref 3.87–5.11)
RDW: 14.3 % (ref 11.5–15.5)
WBC: 3.9 10*3/uL — AB (ref 4.0–10.5)

## 2014-04-06 LAB — URINALYSIS, ROUTINE W REFLEX MICROSCOPIC
Bilirubin Urine: NEGATIVE
Glucose, UA: NEGATIVE mg/dL
HGB URINE DIPSTICK: NEGATIVE
KETONES UR: NEGATIVE mg/dL
Leukocytes, UA: NEGATIVE
Nitrite: NEGATIVE
PROTEIN: NEGATIVE mg/dL
Specific Gravity, Urine: 1.011 (ref 1.005–1.030)
UROBILINOGEN UA: 0.2 mg/dL (ref 0.0–1.0)
pH: 7.5 (ref 5.0–8.0)

## 2014-04-06 LAB — COMPREHENSIVE METABOLIC PANEL
ALT: 14 U/L (ref 0–35)
ANION GAP: 13 (ref 5–15)
AST: 20 U/L (ref 0–37)
Albumin: 4.1 g/dL (ref 3.5–5.2)
Alkaline Phosphatase: 67 U/L (ref 39–117)
BILIRUBIN TOTAL: 0.4 mg/dL (ref 0.3–1.2)
BUN: 11 mg/dL (ref 6–23)
CO2: 28 mEq/L (ref 19–32)
Calcium: 9.4 mg/dL (ref 8.4–10.5)
Chloride: 104 mEq/L (ref 96–112)
Creatinine, Ser: 1.04 mg/dL (ref 0.50–1.10)
GFR, EST AFRICAN AMERICAN: 63 mL/min — AB (ref >90)
GFR, EST NON AFRICAN AMERICAN: 55 mL/min — AB (ref >90)
GLUCOSE: 108 mg/dL — AB (ref 70–99)
Potassium: 3.8 mEq/L (ref 3.7–5.3)
Sodium: 145 mEq/L (ref 137–147)
Total Protein: 7.6 g/dL (ref 6.0–8.3)

## 2014-04-06 LAB — LIPASE, BLOOD: Lipase: 41 U/L (ref 11–59)

## 2014-04-06 LAB — CBG MONITORING, ED: GLUCOSE-CAPILLARY: 98 mg/dL (ref 70–99)

## 2014-04-06 MED ORDER — MECLIZINE HCL 25 MG PO TABS
25.0000 mg | ORAL_TABLET | Freq: Once | ORAL | Status: AC
  Administered 2014-04-06: 25 mg via ORAL
  Filled 2014-04-06: qty 1

## 2014-04-06 MED ORDER — IOHEXOL 300 MG/ML  SOLN
80.0000 mL | Freq: Once | INTRAMUSCULAR | Status: AC | PRN
  Administered 2014-04-06: 80 mL via INTRAVENOUS

## 2014-04-06 MED ORDER — MECLIZINE HCL 50 MG PO TABS: 50.0000 mg | ORAL_TABLET | Freq: Every day | ORAL | Status: DC | PRN

## 2014-04-06 NOTE — ED Provider Notes (Signed)
Medical screening examination/treatment/procedure(s) were conducted as a shared visit with non-physician practitioner(s) and myself.  I personally evaluated the patient during the encounter.   EKG Interpretation   Date/Time:  Wednesday April 06 2014 11:19:54 EDT Ventricular Rate:  59 PR Interval:  189 QRS Duration: 95 QT Interval:  415 QTC Calculation: 411 R Axis:   -4 Text Interpretation:  Age not entered, assumed to be  68 years old for  purpose of ECG interpretation Sinus rhythm No change from prior Confirmed  by HORTON  MD, COURTNEY ([id number]) on 04/06/2014 11:23:47 AM      See additional note.  Merryl Hacker, MD 04/06/14 [id number]

## 2014-04-06 NOTE — ED Notes (Signed)
Per Al Corpus, PA, patient is not a stroke/tia candidate and thus does not need a stroke swallow screen

## 2014-04-06 NOTE — ED Provider Notes (Signed)
Medical screening examination/treatment/procedure(s) were conducted as a shared visit with non-physician practitioner(s) and myself.  I personally evaluated the patient during the encounter.   EKG Interpretation   Date/Time:  Wednesday April 06 2014 11:19:54 EDT Ventricular Rate:  59 PR Interval:  189 QRS Duration: 95 QT Interval:  415 QTC Calculation: 411 R Axis:   -4 Text Interpretation:  Age not entered, assumed to be  68 years old for  purpose of ECG interpretation Sinus rhythm No change from prior Confirmed  by HORTON  MD, COURTNEY ([id number]) on 04/06/2014 11:23:47 AM      Patient presents with multiple complaints. He states that she's been having ongoing bilateral hand pain that comes and goes. She does not notice it is worse during anytime of the day. Currently she is pain-free. She's not taking anything for the pain. She also reports left lower quadrant pain. She reports on and off room spinning dizziness. Currently she denies any symptoms. Patient also reports that this morning she took an extra dose of her Bystolic and when she noted her mistake she stuck her fingers down her throat and vomited blood. She states that it was vomitus streaked with bright red blood. She denies any dark or tarry stools.  Patient denies any weakness, numbness, or tingling. Patient reports all symptoms have been for greater than a month and come and go. She is nontoxic on exam. Vital signs notable for blood pressure of 172/84.  Otherwise nontoxic on exam. Neurologic exam is reassuring and patient has no signs of dysmetria or cerebellar dysfunction.  Not orthostaticLab work is largely unremarkable with a normal hemoglobin.  Given that the patient self-induced vomiting, and blood noted in vomited be secondary to trauma.  Workup including basic lab work and CT scan of the abdomen to evaluate for her left lower part her pain was reassuring.  At this time I see no indication for further imaging. Patient is not  orthostatic.  Blood pressures ranged from 539-767 systolic while in the emergency room. Given the patient is currently symptom-free, would not treat her blood pressure and would have her followup given extra doses of her blood pressure medications this morning.  On my evaluation, patient continues to endorse multiple chroniccomplaints. She reports that she had similar symptoms in the past and had low vitamin D. I discussed with the patient that she would need to have her vitamin D checked by her primary care physician. Her complaints appear to be chronic in nature and given her reassuring exam, I feel followup is appropriate. Patient states frustration that we have not "found a reason for her pain."  She currently denies any pain.  Again I have reassured the patient and encouraged her to followup with her primary care physician. She was given meclizine for her intermittent dizziness and encouraged to use ibuprofen and/or Tylenol for hand pain when she has it. I have offered the patient more pain medication; however, she states "I don't want to treat the symptom, I want to know the cause."  At discharge, the PA spoke with the patient regarding her workup and tried to reassure the patient. The patient stated frustration with the "system."  Merryl Hacker, MD 04/06/14 [id number]

## 2014-04-06 NOTE — ED Notes (Signed)
Pt has multiple complaints. Reports LLQ pain that radiates around to lower back. Having dizziness, headaches, excessive thirst, hoarseness/scratchy throat, pains occuring in bilateral hands and feet and reports sticking finger down her throat this am and "vomiting fresh blood." airway intact no acute distress noted at triage.

## 2014-04-06 NOTE — ED Notes (Addendum)
Patient states that she has been having intermittent pains in all over her extremities, and dizziness as well. Patient also states that she took 2 2.5 mg bystolic last night on accident, forgot that she took them and took another one this morning, tried to make herself vomit when she remembered but was unsuccessful, during this attempt she noticed a small amount of blood in her emesis.  Currently patient is not experiencing pain in her extremities but is feeling pain in her back.   States that when the pain comes, it is a 12 our of 10.  nad noted, no neuro deficits noted.

## 2014-04-06 NOTE — ED Notes (Addendum)
Dr. Horton at bedside. 

## 2014-04-06 NOTE — ED Notes (Signed)
Al Corpus, PA at bedside giving discharge instructions and answering patient's questions.

## 2014-04-06 NOTE — Discharge Instructions (Signed)
Follow up with your primary care provider to address your hypertension, dizziness, hand pain and your previous vitamin D deficiency.   Abdominal Pain Many things can cause abdominal pain. Usually, abdominal pain is not caused by a disease and will improve without treatment. It can often be observed and treated at home. Your health care provider will do a physical exam and possibly order blood tests and X-rays to help determine the seriousness of your pain. However, in many cases, more time must pass before a clear cause of the pain can be found. Before that point, your health care provider may not know if you need more testing or further treatment. Return to the ED with worsening of symptoms or if new concerning symptoms develop.  HOME CARE INSTRUCTIONS  Monitor your abdominal pain for any changes. The following actions may help to alleviate any discomfort you are experiencing:  Only take over-the-counter or prescription medicines as directed by your health care provider.  Do not take laxatives unless directed to do so by your health care provider.  Try a clear liquid diet (broth, tea, or water) as directed by your health care provider. Slowly move to a bland diet as tolerated. SEEK MEDICAL CARE IF:  You have unexplained abdominal pain.  You have abdominal pain associated with nausea or diarrhea.  You have pain when you urinate or have a bowel movement.  You experience abdominal pain that wakes you in the night.  You have abdominal pain that is worsened or improved by eating food.  You have abdominal pain that is worsened with eating fatty foods.  You have a fever. SEEK IMMEDIATE MEDICAL CARE IF:   Your pain does not go away within 2 hours.  You keep throwing up (vomiting).  Your pain is felt only in portions of the abdomen, such as the right side or the left lower portion of the abdomen.  You pass bloody or black tarry stools. MAKE SURE YOU:  Understand these instructions.    Will watch your condition.   Will get help right away if you are not doing well or get worse.  Document Released: 06/05/2005 Document Revised: 08/31/2013 Document Reviewed: 05/05/2013 [facility] Patient Information 2015 [place], [place]. This information is not intended to replace advice given to you by your health care provider. Make sure you discuss any questions you have with your health care provider.  Hypertension Hypertension, commonly called high blood pressure, is when the force of blood pumping through your arteries is too strong. Your arteries are the blood vessels that carry blood from your heart throughout your body. A blood pressure reading consists of a higher number over a lower number, such as 110/72. The higher number (systolic) is the pressure inside your arteries when your heart pumps. The lower number (diastolic) is the pressure inside your arteries when your heart relaxes. Ideally you want your blood pressure below 120/80. Hypertension forces your heart to work harder to pump blood. Your arteries may become narrow or stiff. Having hypertension puts you at risk for heart disease, stroke, and other problems.  RISK FACTORS Some risk factors for high blood pressure are controllable. Others are not.  Risk factors you cannot control include:   Race. You may be at higher risk if you are African American.  Age. Risk increases with age.  Gender. Men are at higher risk than women before age 81 years. After age 86, women are at higher risk than men. Risk factors you can control include:  Not getting enough  exercise or physical activity.  Being overweight.  Getting too much fat, sugar, calories, or salt in your diet.  Drinking too much alcohol. SIGNS AND SYMPTOMS Hypertension does not usually cause signs or symptoms. Extremely high blood pressure (hypertensive crisis) may cause headache, anxiety, shortness of breath, and nosebleed. DIAGNOSIS  To check if you have  hypertension, your health care provider will measure your blood pressure while you are seated, with your arm held at the level of your heart. It should be measured at least twice using the same arm. Certain conditions can cause a difference in blood pressure between your right and left arms. A blood pressure reading that is higher than normal on one occasion does not mean that you need treatment. If one blood pressure reading is high, ask your health care provider about having it checked again. TREATMENT  Treating high blood pressure includes making lifestyle changes and possibly taking medicine. Living a healthy lifestyle can help lower high blood pressure. You may need to change some of your habits. Lifestyle changes may include:  Following the DASH diet. This diet is high in fruits, vegetables, and whole grains. It is low in salt, red meat, and added sugars.  Getting at least 2 hours of brisk physical activity every week.  Losing weight if necessary.  Not smoking.  Limiting alcoholic beverages.  Learning ways to reduce stress. If lifestyle changes are not enough to get your blood pressure under control, your health care provider may prescribe medicine. You may need to take more than one. Work closely with your health care provider to understand the risks and benefits. HOME CARE INSTRUCTIONS  Have your blood pressure rechecked as directed by your health care provider.   Take medicines only as directed by your health care provider. Follow the directions carefully. Blood pressure medicines must be taken as prescribed. The medicine does not work as well when you skip doses. Skipping doses also puts you at risk for problems.   Do not smoke.   Monitor your blood pressure at home as directed by your health care provider. SEEK MEDICAL CARE IF:   You think you are having a reaction to medicines taken.  You have recurrent headaches or feel dizzy.  You have swelling in your  ankles.  You have trouble with your vision. SEEK IMMEDIATE MEDICAL CARE IF:  You develop a severe headache or confusion.  You have unusual weakness, numbness, or feel faint.  You have severe chest or abdominal pain.  You vomit repeatedly.  You have trouble breathing. MAKE SURE YOU:   Understand these instructions.  Will watch your condition.  Will get help right away if you are not doing well or get worse. Document Released: 08/26/2005 Document Revised: 01/10/2014 Document Reviewed: 06/18/2013 [facility] Patient Information 2015 [place], [place]. This information is not intended to replace advice given to you by your health care provider. Make sure you discuss any questions you have with your health care provider.  Dizziness Dizziness is a common problem. It is a feeling of unsteadiness or light-headedness. You may feel like you are about to faint. Dizziness can lead to injury if you stumble or fall. A person of any age group can suffer from dizziness, but dizziness is more common in older adults. CAUSES  Dizziness can be caused by many different things, including:  Middle ear problems.  Standing for too long.  Infections.  An allergic reaction.  Aging.  An emotional response to something, such as the sight of blood.  Side effects of medicines.  Tiredness.  Problems with circulation or blood pressure.  Excessive use of alcohol or medicines, or illegal drug use.  Breathing too fast (hyperventilation).  An irregular heart rhythm (arrhythmia).  A low red blood cell count (anemia).  Pregnancy.  Vomiting, diarrhea, fever, or other illnesses that cause body fluid loss (dehydration).  Diseases or conditions such as Parkinson's disease, high blood pressure (hypertension), diabetes, and thyroid problems.  Exposure to extreme heat. DIAGNOSIS  Your health care provider will ask about your symptoms, perform a physical exam, and perform an electrocardiogram (ECG) to  record the electrical activity of your heart. Your health care provider may also perform other heart or blood tests to determine the cause of your dizziness. These may include:  Transthoracic echocardiogram (TTE). During echocardiography, sound waves are used to evaluate how blood flows through your heart.  Transesophageal echocardiogram (TEE).  Cardiac monitoring. This allows your health care provider to monitor your heart rate and rhythm in real time.  Holter monitor. This is a portable device that records your heartbeat and can help diagnose heart arrhythmias. It allows your health care provider to track your heart activity for several days if needed.  Stress tests by exercise or by giving medicine that makes the heart beat faster. TREATMENT  Treatment of dizziness depends on the cause of your symptoms and can vary greatly. HOME CARE INSTRUCTIONS   Drink enough fluids to keep your urine clear or pale yellow. This is especially important in very hot weather. In older adults, it is also important in cold weather.  Take your medicine exactly as directed if your dizziness is caused by medicines. When taking blood pressure medicines, it is especially important to get up slowly.  Rise slowly from chairs and steady yourself until you feel okay.  In the morning, first sit up on the side of the bed. When you feel okay, stand slowly while holding onto something until you know your balance is fine.  Move your legs often if you need to stand in one place for a long time. Tighten and relax your muscles in your legs while standing.  Have someone stay with you for 1-2 days if dizziness continues to be a problem. Do this until you feel you are well enough to stay alone. Have the person call your health care provider if he or she notices changes in you that are concerning.  Do not drive or use heavy machinery if you feel dizzy.  Do not drink alcohol. SEEK IMMEDIATE MEDICAL CARE IF:   Your dizziness  or light-headedness gets worse.  You feel nauseous or vomit.  You have problems talking, walking, or using your arms, hands, or legs.  You feel weak.  You are not thinking clearly or you have trouble forming sentences. It may take a friend or family member to notice this.  You have chest pain, abdominal pain, shortness of breath, or sweating.  Your vision changes.  You notice any bleeding.  You have side effects from medicine that seems to be getting worse rather than better. MAKE SURE YOU:   Understand these instructions.  Will watch your condition.  Will get help right away if you are not doing well or get worse. Document Released: 02/19/2001 Document Revised: 08/31/2013 Document Reviewed: 03/15/2011 [facility] Patient Information 2015 [place], [place]. This information is not intended to replace advice given to you by your health care provider. Make sure you discuss any questions you have with your health care provider.

## 2014-04-06 NOTE — ED Provider Notes (Signed)
CSN: [id number]     Arrival date & time 04/06/14  2831 History   First MD Initiated Contact with Patient 04/06/14 [phone]     Chief Complaint  Patient presents with  . Hematemesis  . Back Pain    Pt is 68 yo female with PMH of GERD and diverticulosis who has multiple complaints. She complains of hematemesis and LLQ pain. She vomiting 1/4-1/8 tsp of blood after self inducing vomiting because she took an extra HTN medication. Blood was bright red. She has never had hematemesis before. Patient endorses LLQ pain that is dull and radiates to low left back. Patient denies changes in stool or black stool, nausea, dyspepsia, hematochezia or hematuria, or epistasis, no urinary changes. Patient denies chest pain, dyspnea. Patient denies any bleeding problems and is not taking any aspirin, anticoagulants or NSAIDs. Patient denies fever, chills or recent illness. Patient endorses dizziness that has been going on for over a month. She describes the room as spining but also has light headedness and fell a couple of days ago. She did not lose consciousness nor hit her head. She has falling 20-30 times in the past year. Patient denies weakness, numbness or tingling. No changes in speech or vision or changes in gait. Patient also complains of chronic bilateral hand pain that comes and goes. Pain is dull and is not relieved or aggravated by anything.   Patient is a 68 y.o. female presenting with back pain.  Back Pain Associated symptoms: no chest pain, no fever, no numbness and no weakness      Past Medical History  Diagnosis Date  . Hypertension   . Anemia   . Hiatal hernia   . Hypercholesterolemia   . GERD (gastroesophageal reflux disease)   . Vitamin D deficiency   . Osteopenia   . Diverticulosis    Past Surgical History  Procedure Laterality Date  . Abdominal hysterectomy    . Fibrocystic breast excision    . Tubal ligation     Family History  Problem Relation Age of Onset  . Stroke Mother   .  Hypertension Mother   . Stroke Brother   . Cancer Neg Hx   . Alcohol abuse Neg Hx   . Early death Neg Hx   . Hearing loss Neg Hx   . Heart disease Neg Hx   . Hyperlipidemia Neg Hx   . Kidney disease Neg Hx    History  Substance Use Topics  . Smoking status: Never Smoker   . Smokeless tobacco: Never Used  . Alcohol Use: No   OB History   Grav Para Term Preterm Abortions TAB SAB Ect Mult Living                 Review of Systems  Constitutional: Negative for fever, chills and diaphoresis.  HENT: Negative for congestion and rhinorrhea.   Respiratory: Negative for cough, chest tightness, shortness of breath, wheezing and stridor.   Cardiovascular: Negative for chest pain and leg swelling.  Gastrointestinal: Positive for vomiting. Negative for nausea, diarrhea, blood in stool, abdominal distention, anal bleeding and rectal pain.  Musculoskeletal: Positive for back pain. Negative for gait problem, neck pain and neck stiffness.  Neurological: Positive for dizziness and light-headedness. Negative for facial asymmetry, speech difficulty, weakness and numbness.  Psychiatric/Behavioral: Negative for behavioral problems, confusion and agitation.      Allergies  Asa; Diltiazem hcl; Naproxen; Norvasc; Triamterene-hctz; Hytrin; Lisinopril; and Penicillins  Home Medications   Prior to Admission medications  Medication Sig Start Date End Date Taking? Authorizing Provider  Calcium 500 MG tablet Take 600 mg by mouth daily.   Yes Historical Provider, MD  cholecalciferol (VITAMIN D) 1000 UNITS tablet Take 1,000 Units by mouth daily.   Yes Historical Provider, MD  GARLIC PO Take 1 tablet by mouth daily.   Yes Historical Provider, MD  magnesium gluconate (MAGONATE) 500 MG tablet Take 500 mg by mouth daily.   Yes Historical Provider, MD  nebivolol (BYSTOLIC) 2.5 MG tablet Take 1 tablet (2.5 mg total) by mouth daily. 12/07/13  Yes Peter M Martinique, MD  NON FORMULARY Take 1 each by mouth See admin  instructions. "Watermelon Tea" Drink one cup of tea once daily.   Yes Historical Provider, MD  meclizine (ANTIVERT) 50 MG tablet Take 1 tablet (50 mg total) by mouth daily as needed for dizziness. 04/06/14   Jordan Hawks L Kody Brandl, PA-C   BP 160/70  Pulse 60  Temp(Src) 98 F (36.7 C) (Oral)  Resp 14  SpO2 100% Physical Exam  Vitals reviewed. Constitutional: She is oriented to person, place, and time. She appears well-developed and well-nourished. No distress.  HENT:  Head: Normocephalic and atraumatic.  Eyes: Conjunctivae and EOM are normal. Pupils are equal, round, and reactive to light. Right eye exhibits no discharge. Left eye exhibits no discharge.  Neck: Normal range of motion. Neck supple. No tracheal deviation present.  Cardiovascular: Intact distal pulses.   Pulses:      Carotid pulses are 2+ on the right side, and 2+ on the left side.      Radial pulses are 2+ on the right side, and 2+ on the left side.       Dorsalis pedis pulses are 2+ on the right side, and 2+ on the left side.       Posterior tibial pulses are 2+ on the right side, and 2+ on the left side.  No carotid bruits bilaterally  Pulmonary/Chest: Effort normal and breath sounds normal. No stridor. No respiratory distress. She has no wheezes. She has no rales.  Abdominal: Soft. Bowel sounds are normal. She exhibits no distension and no mass. There is tenderness in the left lower quadrant. There is no rigidity, no rebound and no guarding. No hernia.  LLQ pain to palpation without rigidity. Left flank pain with CVA tenderness.  Musculoskeletal: Normal range of motion. She exhibits no edema.  Strength: 5/5 in all extremities. Sensation intact. Full ROM in upper and lower extremities.  Slow, careful gait without any foot drop or gait abnormalities.  Lymphadenopathy:    She has no cervical adenopathy.  Neurological: She is alert and oriented to person, place, and time. She displays no tremor. No cranial nerve deficit or sensory  deficit. She exhibits normal muscle tone. Coordination and gait normal.  Skin: Skin is warm and dry. She is not diaphoretic.  Psychiatric: She has a normal mood and affect. Her behavior is normal.    ED Course  Procedures (including critical care time) Labs Review Labs Reviewed  CBC WITH DIFFERENTIAL - Abnormal; Notable for the following:    WBC 3.9 (*)    All other components within normal limits  COMPREHENSIVE METABOLIC PANEL - Abnormal; Notable for the following:    Glucose, Bld 108 (*)    GFR calc non Af Amer 55 (*)    GFR calc Af Amer 63 (*)    All other components within normal limits  LIPASE, BLOOD  URINALYSIS, ROUTINE W REFLEX MICROSCOPIC  CBG MONITORING, ED  Imaging Review Ct Abdomen Pelvis W Contrast  04/06/2014   CLINICAL DATA:  Left lower quadrant pain and back pain.  EXAM: CT ABDOMEN AND PELVIS WITH CONTRAST  TECHNIQUE: Multidetector CT imaging of the abdomen and pelvis was performed using the standard protocol following bolus administration of intravenous contrast.  CONTRAST:  49mL OMNIPAQUE IOHEXOL 300 MG/ML  SOLN  COMPARISON:  CT 01/16/2012 and MRI 01/20/2012  FINDINGS: Minimal scarring/atelectasis over the posterior lung bases. Small sliding hiatal hernia.  Abdominal images demonstrate the liver, spleen, gallbladder and adrenal glands to be within normal should again noted are too small well-defined homogeneously low attenuating lesions over the mid body of the pancreas measuring 8 9 mm respectively without significant change. Small bilateral renal cysts unchanged. No hydronephrosis or nephrolithiasis. Ureters are within normal. There is calcified plaque of the abdominal aorta and iliac arteries. Appendix is normal. There is diverticulosis of the colon.  Pelvic images demonstrate surgical absence of the uterus. Bladder and rectum are unremarkable. Left ovary unremarkable. Right ovary not well visualized. Tiny amount of free fluid adjacent the rectum. Minimal degenerative  change of the hips and spine.  IMPRESSION: No acute findings in the abdomen/pelvis.  Stable 8 and 9 mm cystic lesions over the body of the pancreas.  Bilateral renal cysts stable.  Diverticulosis of the colon.  Tiny amount of free pelvic fluid.   Electronically Signed   By: Marin Olp M.D.   On: 04/06/2014 10:12     EKG Interpretation   Date/Time:  Wednesday April 06 2014 11:19:54 EDT Ventricular Rate:  59 PR Interval:  189 QRS Duration: 95 QT Interval:  415 QTC Calculation: 411 R Axis:   -4 Text Interpretation:  Age not entered, assumed to be  68 years old for  purpose of ECG interpretation Sinus rhythm No change from prior Confirmed  by HORTON  MD, Susan Moore ([id number]) on 04/06/2014 11:23:47 AM      MDM   Final diagnoses:  Essential hypertension  RLQ abdominal pain  Dizziness  Bilateral hand pain   RLQ pain: pain resolved in the emergency room without pain meds. Patient had an unremarkable exam and  WBC, lipase and CT scan. UA was negative for infection. It is unlikely that her condition is diverticulitis or colitis or an emergent condition. Additionally hematemesis likely due to trauma due to her history and unremarkable abdominal work up.  Dizziness: patient's dizziness is chronic and without any abnormal neurological findings. The dizziness resolved in ED. Patients had unremarkable orthostatic vital signs and a normal hemoglobin. Patient's dizziness unlikely due to an acute neurological event. Take Meclizine PRN for dizziness and follow up with PCP.  Hypertension: blood pressure fluctuated while in the emergency room and was high. There is question of her HTN medicine was effective because she vomited. She also took extra medication. Due to this uncertain dosage today, follow up with primary care for HTN recheck. Continue to take medications as prescribed.   Patient was upset and unhappy with care due to uncertainty of the cause of her symptoms. Patient was upset that the cause of  her symptoms of hematemesis was not identified. This complaint was addressed and patient voiced understanding. Her hypertension, dizziness, hand pain and vitamin D deficiency were chronic issues.  Her concerns were addressed and we spoke extensively of the role of the PCP in chronic complains and the emergency room in emergent complains and distinguishing where she can get the best care. Patients concerns about how long she was in the  emergency room and the amount of time labs take were addressed and patient voiced understanding.  Review of her results were discussed with her and the patient voiced understanding. Discussed return precautions with patient. Discussed all results and patient verbalizes understanding and agrees with plan Follow up with primary care physician for hypertension, vitamin D deficiency, dizziness and hand pain.   Patient was discussed with physician Dr. Thayer Jew who saw and evaluated the patient.  Pura Spice, PA-C 04/06/14 [phone]

## 2014-05-26 ENCOUNTER — Telehealth: Payer: Self-pay | Admitting: Internal Medicine

## 2014-05-26 ENCOUNTER — Encounter: Payer: Self-pay | Admitting: Nurse Practitioner

## 2014-05-26 ENCOUNTER — Ambulatory Visit (INDEPENDENT_AMBULATORY_CARE_PROVIDER_SITE_OTHER): Payer: Medicare Other | Admitting: Nurse Practitioner

## 2014-05-26 VITALS — BP 160/100 | HR 76 | Temp 98.1°F | Resp 12 | Ht 64.0 in | Wt 168.2 lb

## 2014-05-26 DIAGNOSIS — H8110 Benign paroxysmal vertigo, unspecified ear: Secondary | ICD-10-CM

## 2014-05-26 DIAGNOSIS — I10 Essential (primary) hypertension: Secondary | ICD-10-CM

## 2014-05-26 DIAGNOSIS — H6120 Impacted cerumen, unspecified ear: Secondary | ICD-10-CM

## 2014-05-26 DIAGNOSIS — H6121 Impacted cerumen, right ear: Secondary | ICD-10-CM

## 2014-05-26 MED ORDER — MECLIZINE HCL 12.5 MG PO TABS: 12.5000 mg | ORAL_TABLET | Freq: Three times a day (TID) | ORAL | Status: DC | PRN

## 2014-05-26 MED ORDER — DOXAZOSIN MESYLATE 1 MG PO TABS: 1.0000 mg | ORAL_TABLET | Freq: Every day | ORAL | Status: DC

## 2014-05-26 NOTE — Progress Notes (Signed)
Subjective:    Patient ID: Anne [name], female    DOB: Dec 08, 1945, 68 y.o.   MRN: [id number]  HPI  Patient is seen in office today for complaints of dizziness and hypertension control.  She states she stopped taking aldactone and Bystolic a few months ago.  She didn't like the way they made her feel.  She states she had upper abdominal bloating and hair loss.  She has been taking watermelon tea and states this has treated her lower leg edema.  She took a cayenne pepper and said this helped Her dizziness.  She has multiple side effects to medications and is "afraid" to take them.  She was seen in the ER for dizziness and prescribed meclizine 50 mg which the patient states she did not get filled.  Allergies and medications reviewed.     Review of Systems  Constitutional: Negative.   HENT: Positive for congestion and hearing loss. Negative for ear pain, postnasal drip, rhinorrhea, sinus pressure and tinnitus.        Patient said she was told she has cerumen impaction in right ear when seen in ER recently.  Respiratory: Negative for cough, chest tightness, shortness of breath, wheezing and stridor.   Cardiovascular: Positive for palpitations. Negative for chest pain.       Palpitations occasionally  She states she uses the valsalva maneuver to stop this.   Gastrointestinal: Negative.   Skin: Negative.  Negative for rash.  Neurological: Positive for dizziness. Negative for facial asymmetry, light-headedness and numbness.  Psychiatric/Behavioral: Negative for agitation.   Past Medical History  Diagnosis Date  . Hypertension   . Anemia   . Hiatal hernia   . Hypercholesterolemia   . GERD (gastroesophageal reflux disease)   . Vitamin D deficiency   . Osteopenia   . Diverticulosis     History   Social History  . Marital Status: Married    Spouse Name: N/A    Number of Children: 2  . Years of Education: N/A   Occupational History  . retired    Social History Main Topics  .  Smoking status: Never Smoker   . Smokeless tobacco: Never Used  . Alcohol Use: No  . Drug Use: No  . Sexual Activity: Yes    Birth Control/ Protection: Surgical   Other Topics Concern  . Not on file   Social History Narrative  . No narrative on file    Past Surgical History  Procedure Laterality Date  . Abdominal hysterectomy    . Fibrocystic breast excision    . Tubal ligation      Family History  Problem Relation Age of Onset  . Stroke Mother   . Hypertension Mother   . Stroke Brother   . Cancer Neg Hx   . Alcohol abuse Neg Hx   . Early death Neg Hx   . Hearing loss Neg Hx   . Heart disease Neg Hx   . Hyperlipidemia Neg Hx   . Kidney disease Neg Hx     Allergies  Allergen Reactions  . Asa [Aspirin] Other (See Comments)    Causes stomach issues  . Diltiazem Hcl Nausea And Vomiting  . Naproxen Nausea And Vomiting    Gastric intolerance  . Norvasc [Amlodipine Besylate] Swelling    Severe edema   . Triamterene-Hctz Other (See Comments)    Acute renal failure, hypokalemia   . Hytrin [Terazosin Hcl] Other (See Comments)    Pt states she can not take this  med but can not relate any side effect or incident that occurred  . Lisinopril Other (See Comments)    Unknown, pt says she "can't take it", cough  . Penicillins Itching    Current Outpatient Prescriptions on File Prior to Visit  Medication Sig Dispense Refill  . Calcium 500 MG tablet Take 600 mg by mouth daily.      . cholecalciferol (VITAMIN D) 1000 UNITS tablet Take 1,000 Units by mouth daily.      Marland Kitchen GARLIC PO Take 1 tablet by mouth daily.      . magnesium gluconate (MAGONATE) 500 MG tablet Take 500 mg by mouth daily.      . nebivolol (BYSTOLIC) 2.5 MG tablet Take 1 tablet (2.5 mg total) by mouth daily.  30 tablet  6  . NON FORMULARY Take 1 each by mouth See admin instructions. "Watermelon Tea" Drink one cup of tea once daily.       No current facility-administered medications on file prior to visit.     BP 192/102  Pulse 76  Temp(Src) 98.1 F (36.7 C) (Oral)  Resp 12  Ht 5\' 4"  (1.626 m)  Wt 168 lb 4 oz (76.318 kg)  BMI 28.87 kg/m2         Objective:   Physical Exam  Constitutional: She appears well-developed and well-nourished. No distress.  HENT:  Head: Normocephalic.  Left Ear: External ear normal.  Nose: Nose normal.  Mouth/Throat: Oropharynx is clear and moist. No oropharyngeal exudate.  Right ear with cerumen impaction  Eyes: Pupils are equal, round, and reactive to light.  Neck: Neck supple. No thyromegaly present.  Cardiovascular: Normal rate, regular rhythm and normal heart sounds.   Pulmonary/Chest: Effort normal and breath sounds normal. No respiratory distress. She has no wheezes. She has no rales.  Musculoskeletal: She exhibits no edema.  Skin: Skin is warm and dry.  Psychiatric: She has a normal mood and affect.          Assessment & Plan:  1. Benign paroxysmal positional vertigo, unspecified laterality - meclizine (ANTIVERT) 12.5 MG tablet; Take 1 tablet (12.5 mg total) by mouth 3 (three) times daily as needed for dizziness.  Dispense: 30 tablet; Refill: 0  2. Cerumen impaction, right Will remove was at next appointment once BP better controlled  3. Essential hypertension, benign Discuss at length with patient the importance of treating her hypertension and Bystolic is a very good medication for this. Patient refuses to restart Bystolic or aldactone.  Patient requests Cardura , she states she did not have side effects to this drug and wants to restart. Patient unable to recall a specific reaction to Hytrin when discussed at length. Discussed with patient 8 times the risk of Stroke cardiovascular event based on lowest BP recording today.  Consulted with Dr. Linna Darner and treatment plan as follows.  Patient to follow up in 2 weeks for re evaluation.  Pateitn verbalizes understanding - doxazosin (CARDURA) 1 MG tablet; Take 1 tablet (1 mg total) by mouth  daily.  Dispense: 30 tablet; Refill: 0

## 2014-05-26 NOTE — Telephone Encounter (Signed)
Pt request to transfer from Dr. Ronnald Ramp to Dr. Doug Sou. Pt would like to be with female doctor. Please advise.

## 2014-05-26 NOTE — Telephone Encounter (Signed)
yes

## 2014-05-26 NOTE — Telephone Encounter (Signed)
Fine with me. Dr. Doug Sou

## 2014-05-26 NOTE — Patient Instructions (Addendum)
Follow up in 2 weeks for bp check Call clinic if symptoms worsen or not resolved.  Call clinic with questions and concerns  Hypertension Hypertension, commonly called high blood pressure, is when the force of blood pumping through your arteries is too strong. Your arteries are the blood vessels that carry blood from your heart throughout your body. A blood pressure reading consists of a higher number over a lower number, such as 110/72. The higher number (systolic) is the pressure inside your arteries when your heart pumps. The lower number (diastolic) is the pressure inside your arteries when your heart relaxes. Ideally you want your blood pressure below 120/80. Hypertension forces your heart to work harder to pump blood. Your arteries may become narrow or stiff. Having hypertension puts you at risk for heart disease, stroke, and other problems.  RISK FACTORS Some risk factors for high blood pressure are controllable. Others are not.  Risk factors you cannot control include:   Race. You may be at higher risk if you are African American.  Age. Risk increases with age.  Gender. Men are at higher risk than women before age 87 years. After age 25, women are at higher risk than men. Risk factors you can control include:  Not getting enough exercise or physical activity.  Being overweight.  Getting too much fat, sugar, calories, or salt in your diet.  Drinking too much alcohol. SIGNS AND SYMPTOMS Hypertension does not usually cause signs or symptoms. Extremely high blood pressure (hypertensive crisis) may cause headache, anxiety, shortness of breath, and nosebleed. DIAGNOSIS  To check if you have hypertension, your health care provider will measure your blood pressure while you are seated, with your arm held at the level of your heart. It should be measured at least twice using the same arm. Certain conditions can cause a difference in blood pressure between your right and left arms. A blood  pressure reading that is higher than normal on one occasion does not mean that you need treatment. If one blood pressure reading is high, ask your health care provider about having it checked again. TREATMENT  Treating high blood pressure includes making lifestyle changes and possibly taking medicine. Living a healthy lifestyle can help lower high blood pressure. You may need to change some of your habits. Lifestyle changes may include:  Following the DASH diet. This diet is high in fruits, vegetables, and whole grains. It is low in salt, red meat, and added sugars.  Getting at least 2 hours of brisk physical activity every week.  Losing weight if necessary.  Not smoking.  Limiting alcoholic beverages.  Learning ways to reduce stress. If lifestyle changes are not enough to get your blood pressure under control, your health care provider may prescribe medicine. You may need to take more than one. Work closely with your health care provider to understand the risks and benefits. HOME CARE INSTRUCTIONS  Have your blood pressure rechecked as directed by your health care provider.   Take medicines only as directed by your health care provider. Follow the directions carefully. Blood pressure medicines must be taken as prescribed. The medicine does not work as well when you skip doses. Skipping doses also puts you at risk for problems.   Do not smoke.   Monitor your blood pressure at home as directed by your health care provider. SEEK MEDICAL CARE IF:   You think you are having a reaction to medicines taken.  You have recurrent headaches or feel dizzy.  You have  swelling in your ankles.  You have trouble with your vision. SEEK IMMEDIATE MEDICAL CARE IF:  You develop a severe headache or confusion.  You have unusual weakness, numbness, or feel faint.  You have severe chest or abdominal pain.  You vomit repeatedly.  You have trouble breathing. MAKE SURE YOU:   Understand  these instructions.  Will watch your condition.  Will get help right away if you are not doing well or get worse. Document Released: 08/26/2005 Document Revised: 01/10/2014 Document Reviewed: 06/18/2013 [facility] Patient Information 2015 [place], [place]. This information is not intended to replace advice given to you by your health care provider. Make sure you discuss any questions you have with your health care provider.

## 2014-05-26 NOTE — Assessment & Plan Note (Addendum)
Patient non compliant with taking Bystolic and aldactone.  She states she stopped aldactone 2 months ago because it made her feel fullness in her upper abdomen.  She stopped Bystolic several months ago due to hair loss, dizziness and  Feeling fullness in abdomen. Discussed at length with patient risk 8 times normal population based on lowest BP recording today.  Patient refuses to restart Bystolic or aldactone.  She requests starting Cardura.  She has taken this in the past and did not experience side effects.  Patient does not recall specicfic side effects from taking Hytrin.  Patient verbalizes understanding of the above.Will start Cardura 1 mg po daily.

## 2014-05-26 NOTE — Progress Notes (Signed)
Pre visit review using our clinic review tool, if applicable. No additional management support is needed unless otherwise documented below in the visit note. 

## 2014-06-10 ENCOUNTER — Ambulatory Visit (INDEPENDENT_AMBULATORY_CARE_PROVIDER_SITE_OTHER): Payer: Medicare Other | Admitting: Internal Medicine

## 2014-06-10 ENCOUNTER — Encounter: Payer: Self-pay | Admitting: Internal Medicine

## 2014-06-10 ENCOUNTER — Other Ambulatory Visit (INDEPENDENT_AMBULATORY_CARE_PROVIDER_SITE_OTHER): Payer: Medicare Other

## 2014-06-10 ENCOUNTER — Other Ambulatory Visit: Payer: Self-pay | Admitting: Geriatric Medicine

## 2014-06-10 VITALS — BP 158/100 | HR 84 | Temp 98.3°F | Resp 18 | Ht 65.0 in | Wt 170.0 lb

## 2014-06-10 DIAGNOSIS — Z23 Encounter for immunization: Secondary | ICD-10-CM

## 2014-06-10 DIAGNOSIS — H9191 Unspecified hearing loss, right ear: Secondary | ICD-10-CM

## 2014-06-10 DIAGNOSIS — I10 Essential (primary) hypertension: Secondary | ICD-10-CM

## 2014-06-10 DIAGNOSIS — E7800 Pure hypercholesterolemia, unspecified: Secondary | ICD-10-CM

## 2014-06-10 DIAGNOSIS — E559 Vitamin D deficiency, unspecified: Secondary | ICD-10-CM

## 2014-06-10 LAB — LIPID PANEL
CHOL/HDL RATIO: 4
CHOLESTEROL: 234 mg/dL — AB (ref 0–200)
HDL: 56 mg/dL (ref >39.00)
LDL Cholesterol: 168 mg/dL — ABNORMAL HIGH (ref 0–99)
NonHDL: 178
Triglycerides: 50 mg/dL (ref 0.0–149.0)
VLDL: 10 mg/dL (ref 0.0–40.0)

## 2014-06-10 LAB — BASIC METABOLIC PANEL
BUN: 12 mg/dL (ref 6–23)
CHLORIDE: 102 meq/L (ref 96–112)
CO2: 27 meq/L (ref 19–32)
CREATININE: 1 mg/dL (ref 0.4–1.2)
Calcium: 9.5 mg/dL (ref 8.4–10.5)
GFR: 73.81 mL/min (ref >60.00)
GLUCOSE: 97 mg/dL (ref 70–99)
Potassium: 3.5 mEq/L (ref 3.5–5.1)
Sodium: 137 mEq/L (ref 135–145)

## 2014-06-10 LAB — VITAMIN D 25 HYDROXY (VIT D DEFICIENCY, FRACTURES): VITD: 65.36 ng/mL (ref 30.00–100.00)

## 2014-06-10 MED ORDER — DOXAZOSIN MESYLATE 1 MG PO TABS: 1.0000 mg | ORAL_TABLET | Freq: Every day | ORAL | Status: DC

## 2014-06-10 NOTE — Patient Instructions (Signed)
We will check your blood work today including your vitamin D level. We would like to see you back in about 2-3 months to check on your blood pressure and adjust your medicines if we need to.   Keep working to increase your exercise as this will help with weight and with your blood pressure.   Exercise to Lose Weight Exercise and a healthy diet may help you lose weight. Your doctor may suggest specific exercises. EXERCISE IDEAS AND TIPS  Choose low-cost things you enjoy doing, such as walking, bicycling, or exercising to workout videos.  Take stairs instead of the elevator.  Walk during your lunch break.  Park your car further away from work or school.  Go to a gym or an exercise class.  Start with 5 to 10 minutes of exercise each day. Build up to 30 minutes of exercise 4 to 6 days a week.  Wear shoes with good support and comfortable clothes.  Stretch before and after working out.  Work out until you breathe harder and your heart beats faster.  Drink extra water when you exercise.  Do not do so much that you hurt yourself, feel dizzy, or get very short of breath. Exercises that burn about 150 calories:  Running 1  miles in 15 minutes.  Playing volleyball for 45 to 60 minutes.  Washing and waxing a car for 45 to 60 minutes.  Playing touch football for 45 minutes.  Walking 1  miles in 35 minutes.  Pushing a stroller 1  miles in 30 minutes.  Playing basketball for 30 minutes.  Raking leaves for 30 minutes.  Bicycling 5 miles in 30 minutes.  Walking 2 miles in 30 minutes.  Dancing for 30 minutes.  Shoveling snow for 15 minutes.  Swimming laps for 20 minutes.  Walking up stairs for 15 minutes.  Bicycling 4 miles in 15 minutes.  Gardening for 30 to 45 minutes.  Jumping rope for 15 minutes.  Washing windows or floors for 45 to 60 minutes. Document Released: 09/28/2010 Document Revised: 11/18/2011 Document Reviewed: 09/28/2010 [facility] Patient  Information 2015 [place], [place]. This information is not intended to replace advice given to you by your health care provider. Make sure you discuss any questions you have with your health care provider.

## 2014-06-10 NOTE — Assessment & Plan Note (Signed)
Right ear impacted with wax and cleaned out at today's visit with good success. No signs of infection and advised her to stop taking meclizine if she is not dizzy as it can cause dizziness as a side effect.

## 2014-06-10 NOTE — Progress Notes (Signed)
Pre visit review using our clinic review tool, if applicable. No additional management support is needed unless otherwise documented below in the visit note. 

## 2014-06-10 NOTE — Progress Notes (Signed)
   Subjective:    Patient ID: Anne [name], female    DOB: [birth year], 68 y.o.   MRN: [id number]  HPI The patient is a 68 YO female who is coming in today to establish care. She has been to the doctor recently and was told her BP was high and she needed her ears cleaned out. She has had many problems with medications for her blood pressure and was started on cardura 1 mg at last visit. She has only been taking 1/2 of this pill and will gradually wean herself to one pill as she is able. She denies headache, chest pains, blurred vision, nausea, vomiting, confusion. She also states that she had vitamin D deficiency awhile ago and she has asked for it to be checked and it has not been checked. She has been dealing with dizziness recently with an ear infection but that is better now.  Review of Systems  Constitutional: Negative for fever, activity change, appetite change and fatigue.  HENT: Positive for ear discharge and ear pain.   Respiratory: Negative for cough, chest tightness, shortness of breath and wheezing.   Cardiovascular: Negative for chest pain, palpitations and leg swelling.  Gastrointestinal: Negative for nausea, vomiting, abdominal pain, diarrhea, constipation and abdominal distention.  Musculoskeletal: Negative.   Skin: Negative.   Neurological: Positive for dizziness. Negative for weakness, light-headedness and headaches.      Objective:   Physical Exam  Constitutional: She is oriented to person, place, and time. She appears well-developed and well-nourished. No distress.  HENT:  Head: Normocephalic and atraumatic.  Left Ear: External ear normal.  Right ear obscured with wax.  Eyes: EOM are normal.  Neck: Normal range of motion.  Cardiovascular: Normal rate and regular rhythm.   Pulmonary/Chest: Effort normal and breath sounds normal. No respiratory distress. She has no wheezes. She has no rales.  Abdominal: Soft. Bowel sounds are normal. She exhibits no distension. There  is no tenderness. There is no rebound.  Neurological: She is alert and oriented to person, place, and time. Coordination normal.  Skin: Skin is warm and dry.      Assessment & Plan:

## 2014-06-10 NOTE — Assessment & Plan Note (Signed)
Will check vitamin D level today.

## 2014-06-10 NOTE — Assessment & Plan Note (Signed)
BP continues to be elevated and the only medicine at this time she is willing to take is cardura. She is currently taking 1/2 pill (0.5 mg daily) and have encouraged her to start taking 1 pill per day. May need titration at next visit.

## 2014-06-10 NOTE — Assessment & Plan Note (Signed)
Check lipid panel today 

## 2014-07-01 ENCOUNTER — Telehealth: Payer: Self-pay

## 2014-07-01 NOTE — Telephone Encounter (Signed)
LVM for pt to call back.    RE: schedule AWV for 2015 with Marya Amsler if pt allows.

## 2014-07-12 ENCOUNTER — Ambulatory Visit (INDEPENDENT_AMBULATORY_CARE_PROVIDER_SITE_OTHER): Payer: Medicare Other | Admitting: Family

## 2014-07-12 ENCOUNTER — Encounter: Payer: Self-pay | Admitting: Family

## 2014-07-12 VITALS — BP 160/98 | HR 72 | Temp 98.1°F | Resp 18 | Ht 65.0 in | Wt 171.4 lb

## 2014-07-12 DIAGNOSIS — Z23 Encounter for immunization: Secondary | ICD-10-CM

## 2014-07-12 DIAGNOSIS — Z0000 Encounter for general adult medical examination without abnormal findings: Secondary | ICD-10-CM

## 2014-07-12 NOTE — Assessment & Plan Note (Signed)
1) Anticipatory Guidance: Discussed importance of wearing a seatbelt while driving and not texting while driving; changing batteries in smoke detector at least once annually; wearing suntan lotion when outside; eating a balanced and moderate diet; getting physical activity at least 30 minutes per day. Discussed fall risk reduction removal of throw rugs and other furniture she may trip on.  2) Immunizations / Screenings / Labs:  Given PPSV23 vaccination in office and Zostovax prescription was sent to pharmacy. All other immunizations are up to date. / All screenings are up to date / Labs were recently completed so are not needed at this time.

## 2014-07-12 NOTE — Progress Notes (Signed)
Subjective:   Patient ID: Anne [name], female    DOB: 07/15/1946, 68 y.o.   MRN: [id number]   Anne [name] is a 68 y.o. female who presents for Medicare Annual/Subsequent preventive examination.  Preventive Screening-Counseling & Management  Tobacco History  Smoking status  . Never Smoker   Smokeless tobacco  . Never Used    Problems Prior to Visit 1.   Current Problems (verified)  Patient Active Problem List   Diagnosis Date Noted  . Hearing loss 09/27/2012  . Other screening mammogram 09/27/2012  . Pure hypercholesterolemia 09/25/2012  . HTN (hypertension) 10/01/2011    Medications Prior to Visit  Current Outpatient Prescriptions on File Prior to Visit  Medication Sig Dispense Refill  . Calcium 500 MG tablet Take 600 mg by mouth daily.    . cholecalciferol (VITAMIN D) 1000 UNITS tablet Take 1,000 Units by mouth daily.    Marland Kitchen doxazosin (CARDURA) 1 MG tablet Take 1 tablet (1 mg total) by mouth daily. 30 tablet 5  . GARLIC PO Take 1 tablet by mouth daily.    . magnesium gluconate (MAGONATE) 500 MG tablet Take 500 mg by mouth daily.     No current facility-administered medications on file prior to visit.    Allergies (verified) Allergies  Allergen Reactions  . Asa [Aspirin] Other (See Comments)    Causes stomach issues  . Diltiazem Hcl Nausea And Vomiting    Hospitalized when it occurred   . Naproxen Other (See Comments)    Gastric intolerance  . Norvasc [Amlodipine Besylate] Swelling    Severe edema / Headaches   . Triamterene-Hctz Other (See Comments)    Acute renal failure, hypokalemia   . Indomethacin     Esphogitis  . Tenex [Guanfacine Hcl] Other (See Comments)    Chronic insomnia   . Hytrin [Terazosin Hcl] Other (See Comments)    Pt states she can not take this med but can not relate any side effect or incident that occurred  . Lisinopril Other (See Comments)    Unknown, pt says she "can't take it", cough/hypotension and thirst  .  Penicillins Itching    PAST HISTORY  Family History Family History  Problem Relation Age of Onset  . Stroke Mother   . Hypertension Mother   . Stroke Brother   . Cancer Neg Hx   . Alcohol abuse Neg Hx   . Early death Neg Hx   . Hearing loss Neg Hx   . Heart disease Neg Hx   . Hyperlipidemia Neg Hx   . Kidney disease Neg Hx     Social History History  Substance Use Topics  . Smoking status: Never Smoker   . Smokeless tobacco: Never Used  . Alcohol Use: No     Are there smokers in your home (other than you)? No  Risk Factors Current exercise habits: Tries to walk around - has joined the gym.  Dietary issues discussed: States she is eating a balanced, moderate diet the best she can.   Cardiac risk factors:  Hypertension, hyperlipidemia, overweight  Depression Screen (Note: if answer to either of the following is "Yes", a more complete depression screening is indicated)   Over the past two weeks, have you felt down, depressed or hopeless? No  Over the past two weeks, have you felt little interest or pleasure in doing things? No  Have you lost interest or pleasure in daily life? No  Do you often feel hopeless? No  Do you cry  easily over simple problems? No  Activities of Daily Living In your present state of health, do you have any difficulty performing the following activities?:  Driving? No Managing money?  No Feeding yourself? No Getting from bed to chair? No Climbing a flight of stairs? No Preparing food and eating?:No Bathing or showering? No Getting dressed: No Getting to the toilet? No Using the toilet: No Moving around from place to place: No In the past year have you fallen or had a near fall?: Yes  Are you sexually active? Yes  Do you have more than one partner? No  Hearing Difficulties: No Do you often ask people to speak up or repeat themselves? No Do you experience ringing or noises in your ears? No  Do you have difficulty understanding soft or  whispered voices? No  Do you feel that you have a problem with memory? No  Do you often misplace items? No  Do you feel safe at home? Yes  Cognitive Testing  Alert? Yes  Normal Appearance? Yes  Oriented to person? Yes  Place? Yes  Time? Yes  Recall of three objects?  Yes  Can perform simple calculations? Yes  Displays appropriate judgment? Yes  Can read the correct time from a watch face? Yes   Advanced Directives have been discussed with the patient? Does not have any advanced directives in place.  List the Names of Other Physician/Practitioners you currently use: 1.  Dr. Peter Martinique - Cardiology  Indicate any recent Medical Services you may have received from other than [facility] providers in the past year (date may be approximate).  Immunization History  Administered Date(s) Administered  . Influenza,inj,Quad PF,36+ Mos 09/30/2013, 06/10/2014    Screening Tests Health Maintenance  Topic Date Due  . ZOSTAVAX  01/29/2008  . PNEUMOCOCCAL POLYSACCHARIDE VACCINE AGE 28 AND OVER  01/28/2013  . MAMMOGRAM  10/30/2014  . INFLUENZA VACCINE  04/10/2015  . COLONOSCOPY  03/25/2016  . TETANUS/TDAP  06/23/2017  Will complete PPSV23; Shingles vaccination order  All answers were reviewed with the patient and necessary referrals were made:  Mauricio Po, [place]   07/12/2014   Review of Systems Constitutional: Denies fever, chills, fatigue, or significant weight gain/loss. HENT: Head: Denies headache or neck pain Ears: Denies changes in hearing, ringing in ears, earache, drainage Nose: Denies discharge, stuffiness, itching, nosebleed, sinus pain Throat: Denies sore throat, hoarseness, dry mouth, sores, thrush Eyes: Denies loss/changes in vision, pain, redness, blurry/double vision, flashing lights Cardiovascular: Denies chest pain/discomfort, tightness, palpitations, shortness of breath with activity, difficulty lying down, swelling, sudden awakening with shortness of breath Respiratory:  Denies shortness of breath, cough, sputum production, wheezing Gastrointestinal: Denies dysphasia, heartburn, change in appetite, nausea, change in bowel habits, rectal bleeding, constipation, diarrhea, yellow skin or eyes Genitourinary: Denies frequency, urgency, burning/pain, blood in urine, incontinence, change in urinary strength. Musculoskeletal: Denies muscle/joint pain, stiffness, back pain, redness or swelling of joints, trauma Skin: Denies rashes, lumps, itching, dryness, color changes, or hair/nail changes Neurological: Denies dizziness, fainting, seizures, weakness, numbness, tingling, tremor Psychiatric - Denies nervousness, stress, depression or memory loss Endocrine: Denies heat or cold intolerance, sweating, frequent urination, excessive thirst, changes in appetite Hematologic: Denies ease of bruising or bleeding   Objective:  BP 160/98 mmHg  Pulse 72  Temp(Src) 98.1 F (36.7 C) (Oral)  Resp 18  Ht 5\' 5"  (1.651 m)  Wt 171 lb 6.4 oz (77.747 kg)  BMI 28.52 kg/m2  SpO2 99%    There is no weight on file  to calculate BMI. There were no vitals taken for this visit.  Physical Exam  Constitutional: She is oriented to person, place, and time. She appears well-developed and well-nourished.  HENT:  Head: Normocephalic.  Eyes: Conjunctivae and EOM are normal. Pupils are equal, round, and reactive to light.  Neck: Normal range of motion. Neck supple. No JVD present. No tracheal deviation present. No thyromegaly present.  Cardiovascular: Normal rate, regular rhythm, normal heart sounds and intact distal pulses.   Pulmonary/Chest: Effort normal and breath sounds normal. No respiratory distress. She has no wheezes. She has no rales. She exhibits no tenderness.  Abdominal: Soft. Bowel sounds are normal. She exhibits no distension and no mass. There is no tenderness. There is no rebound and no guarding. No hernia.  Musculoskeletal: Normal range of motion.  Lymphadenopathy:    She has  no cervical adenopathy.  Neurological: She is alert and oriented to person, place, and time. She has normal reflexes.  Skin: Skin is warm and dry.  Psychiatric: She has a normal mood and affect. Her behavior is normal. Judgment and thought content normal.         Assessment:          Plan:     During the course of the visit the patient was educated and counseled about appropriate screening and preventive services including:    Influenza vaccine  Screening mammography  Colorectal cancer screening  Nutrition counseling   Advanced directives: has NO advanced directive - not interested in additional information  Diet review for nutrition referral? Yes ____  Not Indicated __X__   Patient Instructions (the written plan) was given to the patient.  Medicare Attestation I have personally reviewed: The patient's medical and social history Their use of alcohol, tobacco or illicit drugs Their current medications and supplements The patient's functional ability including ADLs,fall risks, home safety risks, cognitive, and hearing and visual impairment Diet and physical activities Evidence for depression or mood disorders  The patient's weight, height, BMI, and visual acuity have been recorded in the chart.  I have made referrals, counseling, and provided education to the patient based on review of the above and I have provided the patient with a written personalized care plan for preventive services.     Mauricio Po, FNP   07/12/2014

## 2014-07-12 NOTE — Progress Notes (Signed)
Pre visit review using our clinic review tool, if applicable. No additional management support is needed unless otherwise documented below in the visit note. 

## 2014-07-12 NOTE — Patient Instructions (Signed)
Thank you for choosing [facility].  Summary/Instructions:   You have completed your annual wellness exam  Your vaccination has been sent to your pharmacy  Please schedule a wellness exam for 1 year  Please follow up with Dr. Doug Sou for your blood pressure.

## 2014-08-10 ENCOUNTER — Encounter: Payer: Self-pay | Admitting: Internal Medicine

## 2014-08-10 ENCOUNTER — Ambulatory Visit (INDEPENDENT_AMBULATORY_CARE_PROVIDER_SITE_OTHER): Payer: Medicare Other | Admitting: Internal Medicine

## 2014-08-10 VITALS — BP 178/100 | HR 78 | Temp 98.0°F | Wt 169.1 lb

## 2014-08-10 DIAGNOSIS — I10 Essential (primary) hypertension: Secondary | ICD-10-CM

## 2014-08-10 MED ORDER — DOXAZOSIN MESYLATE 2 MG PO TABS: 2.0000 mg | ORAL_TABLET | Freq: Every day | ORAL | Status: DC

## 2014-08-10 NOTE — Progress Notes (Signed)
Pre visit review using our clinic review tool, if applicable. No additional management support is needed unless otherwise documented below in the visit note. 

## 2014-08-10 NOTE — Assessment & Plan Note (Signed)
BP not improved and still above goal. She does refuse to go on any additional blood pressure agents. After much convincing I did manage to convince her to increase to 2 mg of her doxazosin. Spoke with her about the risks and possible harms involved with running a blood pressure as high as hers.

## 2014-08-10 NOTE — Patient Instructions (Signed)
We do need to increase your blood pressure medication. We will send in a new prescription with a stronger dosage (2mg ). Until you run out you can take 2 tablets of your doxazosin daily.   We would like to see you back in about 2-3 months to check on your blood pressure to make sure that it is coming down.  Low-Sodium Eating Plan Sodium raises blood pressure and causes water to be held in the body. Getting less sodium from food will help lower your blood pressure, reduce any swelling, and protect your heart, liver, and kidneys. We get sodium by adding salt (sodium chloride) to food. Most of our sodium comes from canned, boxed, and frozen foods. Restaurant foods, fast foods, and pizza are also very high in sodium. Even if you take medicine to lower your blood pressure or to reduce fluid in your body, getting less sodium from your food is important. WHAT IS MY PLAN? Most people should limit their sodium intake to 2,300 mg a day. Your health care provider recommends that you limit your sodium intake to __________ a day.  WHAT DO I NEED TO KNOW ABOUT THIS EATING PLAN? For the low-sodium eating plan, you will follow these general guidelines:  Choose foods with a % Daily Value for sodium of less than 5% (as listed on the food label).   Use salt-free seasonings or herbs instead of table salt or sea salt.   Check with your health care provider or pharmacist before using salt substitutes.   Eat fresh foods.  Eat more vegetables and fruits.  Limit canned vegetables. If you do use them, rinse them well to decrease the sodium.   Limit cheese to 1 oz (28 g) per day.   Eat lower-sodium products, often labeled as "lower sodium" or "no salt added."  Avoid foods that contain monosodium glutamate (MSG). MSG is sometimes added to [place] food and some canned foods.  Check food labels (Nutrition Facts labels) on foods to learn how much sodium is in one serving.  Eat more home-cooked food and less  restaurant, buffet, and fast food.  When eating at a restaurant, ask that your food be prepared with less salt or none, if possible.  HOW DO I READ FOOD LABELS FOR SODIUM INFORMATION? The Nutrition Facts label lists the amount of sodium in one serving of the food. If you eat more than one serving, you must multiply the listed amount of sodium by the number of servings. Food labels may also identify foods as:  Sodium free--Less than 5 mg in a serving.  Very low sodium--35 mg or less in a serving.  Low sodium--140 mg or less in a serving.  Light in sodium--50% less sodium in a serving. For example, if a food that usually has 300 mg of sodium is changed to become light in sodium, it will have 150 mg of sodium.  Reduced sodium--25% less sodium in a serving. For example, if a food that usually has 400 mg of sodium is changed to reduced sodium, it will have 300 mg of sodium. WHAT FOODS CAN I EAT? Grains Low-sodium cereals, including oats, puffed wheat and rice, and shredded wheat cereals. Low-sodium crackers. Unsalted rice and pasta. Lower-sodium bread.  Vegetables Frozen or fresh vegetables. Low-sodium or reduced-sodium canned vegetables. Low-sodium or reduced-sodium tomato sauce and paste. Low-sodium or reduced-sodium tomato and vegetable juices.  Fruits Fresh, frozen, and canned fruit. Fruit juice.  Meat and Other Protein Products Low-sodium canned tuna and salmon. Fresh or frozen  meat, poultry, seafood, and fish. Lamb. Unsalted nuts. Dried beans, peas, and lentils without added salt. Unsalted canned beans. Homemade soups without salt. Eggs.  Dairy Milk. Soy milk. Ricotta cheese. Low-sodium or reduced-sodium cheeses. Yogurt.  Condiments Fresh and dried herbs and spices. Salt-free seasonings. Onion and garlic powders. Low-sodium varieties of mustard and ketchup. Lemon juice.  Fats and Oils Reduced-sodium salad dressings. Unsalted butter.  Other Unsalted popcorn and pretzels.    The items listed above may not be a complete list of recommended foods or beverages. Contact your dietitian for more options. WHAT FOODS ARE NOT RECOMMENDED? Grains Instant hot cereals. Bread stuffing, pancake, and biscuit mixes. Croutons. Seasoned rice or pasta mixes. Noodle soup cups. Boxed or frozen macaroni and cheese. Self-rising flour. Regular salted crackers. Vegetables Regular canned vegetables. Regular canned tomato sauce and paste. Regular tomato and vegetable juices. Frozen vegetables in sauces. Salted french fries. Olives. Angie Fava. Relishes. Sauerkraut. Salsa. Meat and Other Protein Products Salted, canned, smoked, spiced, or pickled meats, seafood, or fish. Bacon, ham, sausage, hot dogs, corned beef, chipped beef, and packaged luncheon meats. Salt pork. Jerky. Pickled herring. Anchovies, regular canned tuna, and sardines. Salted nuts. Dairy Processed cheese and cheese spreads. Cheese curds. Blue cheese and cottage cheese. Buttermilk.  Condiments Onion and garlic salt, seasoned salt, table salt, and sea salt. Canned and packaged gravies. Worcestershire sauce. Tartar sauce. Barbecue sauce. Teriyaki sauce. Soy sauce, including reduced sodium. Steak sauce. Fish sauce. Oyster sauce. Cocktail sauce. Horseradish. Regular ketchup and mustard. Meat flavorings and tenderizers. Bouillon cubes. Hot sauce. Tabasco sauce. Marinades. Taco seasonings. Relishes. Fats and Oils Regular salad dressings. Salted butter. Margarine. Ghee. Bacon fat.  Other Potato and tortilla chips. Corn chips and puffs. Salted popcorn and pretzels. Canned or dried soups. Pizza. Frozen entrees and pot pies.  The items listed above may not be a complete list of foods and beverages to avoid. Contact your dietitian for more information. Document Released: 02/15/2002 Document Revised: 08/31/2013 Document Reviewed: 06/30/2013 [facility] Patient Information 2015 [place], [place]. This information is not intended to replace  advice given to you by your health care provider. Make sure you discuss any questions you have with your health care provider.

## 2014-08-10 NOTE — Progress Notes (Signed)
   Subjective:    Patient ID: Anne Anne [name], female    DOB: 1946/03/19, 68 y.o.   MRN: [id number]  HPI The patient is a 68 year old female who comes in today to follow-up on her blood pressure. She is concerned as she has taken vinegar and honey for her throat that this may be affecting her blood pressure medication. She denies any OTC cough syrup or medicine. She has been taking her medication as prescribed. She does eat a good Anne [name]-sodium diet. She has been taking her whole pill of her blood pressure medication as the last time she stated she only been taking half tablet.  Review of Systems  Constitutional: Negative for fever, activity change, appetite change and fatigue.  Respiratory: Negative for cough, chest tightness, shortness of breath and wheezing.   Cardiovascular: Negative for chest pain, palpitations and leg swelling.  Gastrointestinal: Negative for nausea, vomiting, abdominal pain, diarrhea, constipation and abdominal distention.  Musculoskeletal: Negative.   Skin: Negative.   Neurological: Negative for dizziness, weakness, light-headedness and headaches.      Objective:   Physical Exam  Constitutional: She is oriented to person, place, and time. She appears well-developed and well-nourished. No distress.  HENT:  Head: Normocephalic and atraumatic.  Eyes: EOM are normal.  Neck: Normal range of motion.  Cardiovascular: Normal rate and regular rhythm.   Pulmonary/Chest: Effort normal and breath sounds normal. No respiratory distress. She has no wheezes. She has no rales.  Abdominal: Soft. Bowel sounds are normal. She exhibits no distension. There is no tenderness. There is no rebound.  Neurological: She is alert and oriented to person, place, and time. Coordination normal.  Skin: Skin is warm and dry.   Filed Vitals:   08/10/14 0847 08/10/14 0920  BP: 190/102 178/100  Pulse: 78   Temp: 98 F (36.7 C)   TempSrc: Oral   Weight: 169 lb 2 oz (76.715 kg)   SpO2: 98%       Assessment & Plan:

## 2014-08-30 ENCOUNTER — Telehealth: Payer: Self-pay | Admitting: Internal Medicine

## 2014-08-30 NOTE — Telephone Encounter (Signed)
I would recommend that she goes down on her dosage and make a follow up appointment with Dr. Doug Sou or myself.

## 2014-08-30 NOTE — Telephone Encounter (Signed)
Dr. Doug Sou is out. Would you please advise on this? Should I have her schedule an office visit, or have her go down on her dosage?

## 2014-08-30 NOTE — Telephone Encounter (Signed)
Back down to her original 1 mg.

## 2014-08-30 NOTE — Telephone Encounter (Signed)
Per Terri Piedra, patient should cut back to Cardura 1 mg daily. She will break her 2 mg tablets in half and will also call and schedule a follow up visit.

## 2014-08-30 NOTE — Telephone Encounter (Signed)
Pt states she is not tolerating the new dosage of her BP meds, pt states Dr Doug Sou increased dosage. It is making her dizzy, tired and she feels "intoxicated". Pls advise.   [phone]

## 2014-08-30 NOTE — Telephone Encounter (Signed)
She takes Cardura 2mg  daily. What should she decrease to?

## 2014-09-28 ENCOUNTER — Encounter: Payer: Self-pay | Admitting: Internal Medicine

## 2014-09-28 ENCOUNTER — Ambulatory Visit (INDEPENDENT_AMBULATORY_CARE_PROVIDER_SITE_OTHER): Payer: PPO | Admitting: Internal Medicine

## 2014-09-28 VITALS — BP 180/102 | HR 74 | Temp 98.4°F | Resp 12 | Ht 66.0 in | Wt 166.0 lb

## 2014-09-28 DIAGNOSIS — I10 Essential (primary) hypertension: Secondary | ICD-10-CM

## 2014-09-28 MED ORDER — DOXAZOSIN MESYLATE 2 MG PO TABS: 2.0000 mg | ORAL_TABLET | Freq: Every day | ORAL | Status: DC

## 2014-09-28 NOTE — Progress Notes (Signed)
   Subjective:    Patient ID: Anne [name], female    DOB: Aug 15, 1946, 69 y.o.   MRN: [id number]  HPI The patient is a 69 YO female who is coming back in to follow up on her BP. She was unable to tolerate 2 mg doxazosin and has gone back down to 1 mg. She is still having high blood pressures. Denies any headache, chest pains, nausea, vomiting, confusion. She wants to try the brand name as she was on it before and did well when there was no generic. She also wants to talk to a specialist about why she is unable to take any blood pressure medicines. She does not want to try any other agents right now.   Review of Systems  Constitutional: Negative for fever, activity change, appetite change and fatigue.  Respiratory: Negative for cough, chest tightness, shortness of breath and wheezing.   Cardiovascular: Negative for chest pain, palpitations and leg swelling.  Gastrointestinal: Negative for nausea, vomiting, abdominal pain, diarrhea, constipation and abdominal distention.  Musculoskeletal: Negative.   Skin: Negative.   Neurological: Negative for dizziness, weakness, light-headedness and headaches.      Objective:   Physical Exam  Constitutional: She is oriented to person, place, and time. She appears well-developed and well-nourished. No distress.  HENT:  Head: Normocephalic and atraumatic.  Eyes: EOM are normal.  Neck: Normal range of motion.  Cardiovascular: Normal rate and regular rhythm.   Pulmonary/Chest: Effort normal and breath sounds normal. No respiratory distress. She has no wheezes. She has no rales.  Abdominal: Soft. Bowel sounds are normal. She exhibits no distension. There is no tenderness. There is no rebound.  Neurological: She is alert and oriented to person, place, and time. Coordination normal.  Skin: Skin is warm and dry.   Filed Vitals:   09/28/14 0827  BP: 180/102  Pulse: 74  Temp: 98.4 F (36.9 C)  TempSrc: Oral  Resp: 12  Height: 5\' 6"  (1.676 m)  Weight:  166 lb (75.297 kg)  SpO2: 99%      Assessment & Plan:

## 2014-09-28 NOTE — Patient Instructions (Signed)
We will send you to the blood pressure specialist to talk to you about clinical trial or blood pressure medicine.   We will have you try the brand of cardura to see if this does better with you.

## 2014-09-28 NOTE — Assessment & Plan Note (Signed)
Patient was unable to tolerate 2 mg doxazosin and does not wish to try a different agent. Will refer to nephrology to talk to her about BP medicines and possibly clinical trials. Will write rx for doxazosin 2 mg for brand name only to see if she is able to tolerate it.

## 2014-09-28 NOTE — Progress Notes (Signed)
Pre visit review using our clinic review tool, if applicable. No additional management support is needed unless otherwise documented below in the visit note. 

## 2014-10-21 ENCOUNTER — Encounter: Payer: Self-pay | Admitting: Internal Medicine

## 2014-10-21 ENCOUNTER — Other Ambulatory Visit (INDEPENDENT_AMBULATORY_CARE_PROVIDER_SITE_OTHER): Payer: PPO

## 2014-10-21 ENCOUNTER — Ambulatory Visit (INDEPENDENT_AMBULATORY_CARE_PROVIDER_SITE_OTHER): Payer: PPO | Admitting: Internal Medicine

## 2014-10-21 VITALS — BP 164/94 | HR 82 | Temp 97.7°F | Resp 12 | Ht 66.0 in | Wt 166.0 lb

## 2014-10-21 DIAGNOSIS — R079 Chest pain, unspecified: Secondary | ICD-10-CM

## 2014-10-21 DIAGNOSIS — M549 Dorsalgia, unspecified: Secondary | ICD-10-CM | POA: Insufficient documentation

## 2014-10-21 DIAGNOSIS — M546 Pain in thoracic spine: Secondary | ICD-10-CM

## 2014-10-21 LAB — BASIC METABOLIC PANEL
BUN: 12 mg/dL (ref 6–23)
CO2: 33 meq/L — AB (ref 19–32)
Calcium: 9.6 mg/dL (ref 8.4–10.5)
Chloride: 104 mEq/L (ref 96–112)
Creatinine, Ser: 0.99 mg/dL (ref 0.40–1.20)
GFR: 72.01 mL/min (ref >60.00)
GLUCOSE: 97 mg/dL (ref 70–99)
Potassium: 3.6 mEq/L (ref 3.5–5.1)
Sodium: 140 mEq/L (ref 135–145)

## 2014-10-21 LAB — D-DIMER, QUANTITATIVE: D-Dimer, Quant: 0.41 ug/mL-FEU (ref 0.00–0.48)

## 2014-10-21 LAB — TROPONIN I: TNIDX: 0 ug/l (ref 0.00–0.06)

## 2014-10-21 MED ORDER — TRAMADOL HCL 50 MG PO TABS: 50.0000 mg | ORAL_TABLET | Freq: Three times a day (TID) | ORAL | Status: DC | PRN

## 2014-10-21 MED ORDER — DICLOFENAC SODIUM 1 % TD GEL: 2.0000 g | Freq: Three times a day (TID) | TRANSDERMAL | Status: DC | PRN

## 2014-10-21 NOTE — Patient Instructions (Signed)
We have checked the EKG which looks normal. There are no changes to the heart. Your blood pressure is equal on both sides.   We will try tramadol for pain which you can take 1 pill up to 3 times a day. This will help the pain while it heals. If the pain worsens we will ask you to go to the emergency room.   The other medicine we have sent in for pain called voltaren which is a cream for pain that does not get int the blood stream. You can apply to the areas of pain 2-3 times per day.  We will check on the referral to the kidney doctor and make sure that they are getting you in with them.  We will check blood work to make sure there are no heart problems as well.

## 2014-10-21 NOTE — Progress Notes (Signed)
Pre visit review using our clinic review tool, if applicable. No additional management support is needed unless otherwise documented below in the visit note. 

## 2014-10-21 NOTE — Assessment & Plan Note (Signed)
Given radiation into the chest checked EKG which was without changes. BP equal in both arms making dissection much less likely. Also no tearing sensation to the pain (more sharp and stabbing). Not typical for ACS and no radiation or associated symptoms. Low TIMI but uncontrolled HTN. Check troponin, d-dimer (PE not likely), BMP. Possibilities include muscles strain (most likely from exam) or shingles (no rash on exam). Will await results, treat with tramadol and voltaren gel for pain. If worsening or not improving advised ER visit or Saturday clinic. If Anne [name] gets rash treat for shingles but not clear etiology today.

## 2014-10-21 NOTE — Progress Notes (Signed)
   Subjective:    Patient ID: Anne [name], female    DOB: August 06, 1946, 69 y.o.   MRN: [id number]  HPI The patient is a 69 YO female who comes in today for back pain. It started about 1-2 weeks ago. It is located in the center of the back, high up and radiates through the back to the front. She denies any chest pain in the front. Denies SOB, long travel recently or car/plane ride. She denies weight loss, chills, fevers.She rates it 6-7/10 and it is worse when she lays down or is sitting straight up. Slightly better with leaning forward. She is still taking only 1 mg doxazosin as she is having dizziness and wants to know why she cannot take any medication for her blood pressure. She does not wish to hear about any other options as she wants to talk to the kidney specialist. She denies radiation of the back pain into jaw, arms, stomach. No nausea, vomiting, diaphoresis. Mild headaches on occasion, not changed. No recent lifting or activity.   Review of Systems  Constitutional: Negative for fever, activity change, appetite change and fatigue.  Respiratory: Negative for cough, chest tightness, shortness of breath and wheezing.   Cardiovascular: Negative for chest pain, palpitations and leg swelling.  Gastrointestinal: Negative for nausea, vomiting, abdominal pain, diarrhea, constipation and abdominal distention.  Musculoskeletal: Positive for myalgias and back pain. Negative for gait problem.  Skin: Negative.   Neurological: Negative for dizziness, weakness, light-headedness and headaches.      Objective:   Physical Exam  Constitutional: She is oriented to person, place, and time. She appears well-developed and well-nourished.  HENT:  Head: Normocephalic and atraumatic.  Eyes: EOM are normal.  Neck: Normal range of motion. No JVD present.  Cardiovascular: Normal rate and regular rhythm.   BP equal in left and right arm ~170/90s, no rub  Pulmonary/Chest: Effort normal and breath sounds normal.  No respiratory distress. She has no wheezes. She has no rales.  Abdominal: Soft. Bowel sounds are normal.  Neurological: She is alert and oriented to person, place, and time. Coordination normal.  Skin: Skin is warm and dry.   Filed Vitals:   10/21/14 1454  BP: 164/94  Pulse: 82  Temp: 97.7 F (36.5 C)  TempSrc: Oral  Resp: 12  Height: 5\' 6"  (1.676 m)  Weight: 166 lb (75.297 kg)  SpO2: 98%    EKG interpretation: rate 64, normal intervals, normal rhythm, no ST or T wave changes, no change from prior.    Assessment & Plan:

## 2014-11-07 ENCOUNTER — Telehealth: Payer: Self-pay | Admitting: Cardiology

## 2014-11-07 NOTE — Telephone Encounter (Signed)
Pt was trying to reach Dr. Jarrett Soho and Dr. Doug Sou office was paged instead. Pt will call Dr. Irene Shipper office back.

## 2014-11-09 ENCOUNTER — Telehealth: Payer: Self-pay | Admitting: Internal Medicine

## 2014-11-09 ENCOUNTER — Ambulatory Visit: Payer: Medicare Other | Admitting: Internal Medicine

## 2014-11-09 NOTE — Telephone Encounter (Signed)
Pt miss the appt today, do we need to call and rs

## 2014-11-11 NOTE — Telephone Encounter (Signed)
Yes please

## 2014-11-15 ENCOUNTER — Ambulatory Visit: Payer: PPO | Admitting: Internal Medicine

## 2015-01-04 ENCOUNTER — Telehealth: Payer: Self-pay

## 2015-01-04 NOTE — Telephone Encounter (Signed)
LVM for pt to call back as soon as possible.    RE: AWV with Manuela Schwartz for 2016

## 2015-03-08 ENCOUNTER — Telehealth: Payer: Self-pay

## 2015-03-08 NOTE — Telephone Encounter (Signed)
Patient called to educate on Medicare Wellness apt. LVM for the patient to call back to educate and schedule for wellness visit.  To call the office and schedule or will answer questions

## 2015-03-16 NOTE — Telephone Encounter (Signed)
2nd fup call; Patient called to educate on Medicare Wellness apt. LVM for the patient to call back to educate and schedule for wellness visit.

## 2015-04-21 ENCOUNTER — Telehealth: Payer: Self-pay

## 2015-04-21 NOTE — Telephone Encounter (Signed)
AWV outreach and left VM for call back to [phone].

## 2015-07-07 ENCOUNTER — Inpatient Hospital Stay (HOSPITAL_COMMUNITY): Payer: PPO | Admitting: Anesthesiology

## 2015-07-07 ENCOUNTER — Inpatient Hospital Stay (HOSPITAL_COMMUNITY)
Admission: EM | Admit: 2015-07-07 | Discharge: 2015-07-14 | DRG: 329 | Disposition: A | Discharge status: Home / Self Care | Payer: PPO | Attending: General Surgery | Admitting: General Surgery

## 2015-07-07 ENCOUNTER — Encounter (HOSPITAL_COMMUNITY): Admission: EM | Disposition: A | Discharge status: Home / Self Care | Payer: Self-pay | Source: Home / Self Care

## 2015-07-07 ENCOUNTER — Emergency Department (HOSPITAL_COMMUNITY): Payer: PPO

## 2015-07-07 ENCOUNTER — Encounter (HOSPITAL_COMMUNITY): Payer: Self-pay | Admitting: Emergency Medicine

## 2015-07-07 DIAGNOSIS — R350 Frequency of micturition: Secondary | ICD-10-CM | POA: Diagnosis not present

## 2015-07-07 DIAGNOSIS — E559 Vitamin D deficiency, unspecified: Secondary | ICD-10-CM | POA: Diagnosis present

## 2015-07-07 DIAGNOSIS — R1110 Vomiting, unspecified: Secondary | ICD-10-CM

## 2015-07-07 DIAGNOSIS — K56609 Unspecified intestinal obstruction, unspecified as to partial versus complete obstruction: Secondary | ICD-10-CM | POA: Diagnosis present

## 2015-07-07 DIAGNOSIS — K5669 Other intestinal obstruction: Principal | ICD-10-CM | POA: Diagnosis present

## 2015-07-07 DIAGNOSIS — M8580 Other specified disorders of bone density and structure, unspecified site: Secondary | ICD-10-CM | POA: Diagnosis present

## 2015-07-07 DIAGNOSIS — K55029 Acute infarction of small intestine, extent unspecified: Secondary | ICD-10-CM | POA: Diagnosis present

## 2015-07-07 DIAGNOSIS — R1031 Right lower quadrant pain: Secondary | ICD-10-CM | POA: Diagnosis present

## 2015-07-07 DIAGNOSIS — K913 Postprocedural intestinal obstruction: Secondary | ICD-10-CM | POA: Diagnosis not present

## 2015-07-07 DIAGNOSIS — Y838 Other surgical procedures as the cause of abnormal reaction of the patient, or of later complication, without mention of misadventure at the time of the procedure: Secondary | ICD-10-CM | POA: Diagnosis not present

## 2015-07-07 DIAGNOSIS — I10 Essential (primary) hypertension: Secondary | ICD-10-CM | POA: Diagnosis present

## 2015-07-07 DIAGNOSIS — R112 Nausea with vomiting, unspecified: Secondary | ICD-10-CM

## 2015-07-07 HISTORY — PX: LAPAROSCOPIC ILEOCECECTOMY: SHX5898

## 2015-07-07 HISTORY — DX: Other complications of anesthesia, initial encounter: T88.59XA

## 2015-07-07 HISTORY — PX: LAPAROTOMY: SHX154

## 2015-07-07 HISTORY — DX: Adverse effect of unspecified anesthetic, initial encounter: T41.45XA

## 2015-07-07 LAB — CBC WITH DIFFERENTIAL/PLATELET
BASOS ABS: 0 10*3/uL (ref 0.0–0.1)
BASOS PCT: 1 %
Eosinophils Absolute: 0.1 10*3/uL (ref 0.0–0.7)
Eosinophils Relative: 2 %
HEMATOCRIT: 34 % — AB (ref 36.0–46.0)
Hemoglobin: 11.7 g/dL — ABNORMAL LOW (ref 12.0–15.0)
Lymphocytes Relative: 46 %
Lymphs Abs: 1.9 10*3/uL (ref 0.7–4.0)
MCH: 28.4 pg (ref 26.0–34.0)
MCHC: 34.4 g/dL (ref 30.0–36.0)
MCV: 82.5 fL (ref 78.0–100.0)
MONO ABS: 0.3 10*3/uL (ref 0.1–1.0)
Monocytes Relative: 6 %
NEUTROS ABS: 1.9 10*3/uL (ref 1.7–7.7)
Neutrophils Relative %: 45 %
PLATELETS: 161 10*3/uL (ref 150–400)
RBC: 4.12 MIL/uL (ref 3.87–5.11)
RDW: 14.4 % (ref 11.5–15.5)
WBC: 4.1 10*3/uL (ref 4.0–10.5)

## 2015-07-07 LAB — I-STAT TROPONIN, ED: TROPONIN I, POC: 0 ng/mL (ref 0.00–0.08)

## 2015-07-07 LAB — URINALYSIS, ROUTINE W REFLEX MICROSCOPIC
Bilirubin Urine: NEGATIVE
Glucose, UA: NEGATIVE mg/dL
Hgb urine dipstick: NEGATIVE
Ketones, ur: 15 mg/dL — AB
LEUKOCYTES UA: NEGATIVE
NITRITE: NEGATIVE
Protein, ur: NEGATIVE mg/dL
SPECIFIC GRAVITY, URINE: 1.02 (ref 1.005–1.030)
UROBILINOGEN UA: 0.2 mg/dL (ref 0.0–1.0)
pH: 7 (ref 5.0–8.0)

## 2015-07-07 LAB — COMPREHENSIVE METABOLIC PANEL
ALBUMIN: 3.7 g/dL (ref 3.5–5.0)
ALT: 13 U/L — ABNORMAL LOW (ref 14–54)
AST: 22 U/L (ref 15–41)
Alkaline Phosphatase: 58 U/L (ref 38–126)
Anion gap: 11 (ref 5–15)
BILIRUBIN TOTAL: 0.6 mg/dL (ref 0.3–1.2)
BUN: 14 mg/dL (ref 6–20)
CHLORIDE: 103 mmol/L (ref 101–111)
CO2: 23 mmol/L (ref 22–32)
Calcium: 9.3 mg/dL (ref 8.9–10.3)
Creatinine, Ser: 1 mg/dL (ref 0.44–1.00)
GFR calc Af Amer: >60 mL/min (ref >60)
GFR calc non Af Amer: 57 mL/min — ABNORMAL LOW (ref >60)
GLUCOSE: 123 mg/dL — AB (ref 65–99)
POTASSIUM: 3.6 mmol/L (ref 3.5–5.1)
Sodium: 137 mmol/L (ref 135–145)
Total Protein: 6.5 g/dL (ref 6.5–8.1)

## 2015-07-07 LAB — I-STAT CG4 LACTIC ACID, ED
Lactic Acid, Venous: 0.71 mmol/L (ref 0.5–2.0)
Lactic Acid, Venous: 0.85 mmol/L (ref 0.5–2.0)

## 2015-07-07 LAB — LIPASE, BLOOD: Lipase: 30 U/L (ref 11–51)

## 2015-07-07 SURGERY — LAPAROTOMY, EXPLORATORY
Anesthesia: General | Site: Abdomen

## 2015-07-07 MED ORDER — PANTOPRAZOLE SODIUM 40 MG IV SOLR
40.0000 mg | Freq: Every day | INTRAVENOUS | Status: DC
  Administered 2015-07-07 – 2015-07-09 (×3): 40 mg via INTRAVENOUS
  Filled 2015-07-07 (×3): qty 40

## 2015-07-07 MED ORDER — SUGAMMADEX SODIUM 200 MG/2ML IV SOLN
INTRAVENOUS | Status: DC | PRN
  Administered 2015-07-07: 200 mg via INTRAVENOUS

## 2015-07-07 MED ORDER — ONDANSETRON HCL 4 MG/2ML IJ SOLN
INTRAMUSCULAR | Status: AC
  Filled 2015-07-07: qty 6

## 2015-07-07 MED ORDER — NALOXONE HCL 0.4 MG/ML IJ SOLN: 0.4000 mg | INTRAMUSCULAR | Status: DC | PRN

## 2015-07-07 MED ORDER — SODIUM CHLORIDE 0.9 % IV BOLUS (SEPSIS)
1000.0000 mL | Freq: Once | INTRAVENOUS | Status: AC
  Administered 2015-07-07: 1000 mL via INTRAVENOUS

## 2015-07-07 MED ORDER — 0.9 % SODIUM CHLORIDE (POUR BTL) OPTIME
TOPICAL | Status: DC | PRN
  Administered 2015-07-07 (×4): 1000 mL

## 2015-07-07 MED ORDER — ONDANSETRON HCL 4 MG/2ML IJ SOLN
4.0000 mg | Freq: Once | INTRAMUSCULAR | Status: AC
  Administered 2015-07-07: 4 mg via INTRAVENOUS
  Filled 2015-07-07: qty 2

## 2015-07-07 MED ORDER — FENTANYL CITRATE (PF) 100 MCG/2ML IJ SOLN
INTRAMUSCULAR | Status: DC | PRN
  Administered 2015-07-07: 50 ug via INTRAVENOUS

## 2015-07-07 MED ORDER — CHLORHEXIDINE GLUCONATE 0.12 % MT SOLN
15.0000 mL | Freq: Two times a day (BID) | OROMUCOSAL | Status: DC
  Administered 2015-07-07 – 2015-07-10 (×6): 15 mL via OROMUCOSAL
  Filled 2015-07-07 (×5): qty 15

## 2015-07-07 MED ORDER — ROCURONIUM BROMIDE 100 MG/10ML IV SOLN
INTRAVENOUS | Status: DC | PRN
  Administered 2015-07-07: 50 mg via INTRAVENOUS

## 2015-07-07 MED ORDER — LABETALOL HCL 5 MG/ML IV SOLN
INTRAVENOUS | Status: AC
  Administered 2015-07-07: 5 mg via INTRAVENOUS
  Filled 2015-07-07: qty 4

## 2015-07-07 MED ORDER — SUCCINYLCHOLINE CHLORIDE 20 MG/ML IJ SOLN
INTRAMUSCULAR | Status: DC | PRN
  Administered 2015-07-07: 100 mg via INTRAVENOUS

## 2015-07-07 MED ORDER — ENOXAPARIN SODIUM 30 MG/0.3ML ~~LOC~~ SOLN
30.0000 mg | SUBCUTANEOUS | Status: DC
  Administered 2015-07-08 – 2015-07-14 (×7): 30 mg via SUBCUTANEOUS
  Filled 2015-07-07 (×7): qty 0.3

## 2015-07-07 MED ORDER — DIPHENHYDRAMINE HCL 12.5 MG/5ML PO ELIX: 12.5000 mg | ORAL_SOLUTION | Freq: Four times a day (QID) | ORAL | Status: DC | PRN

## 2015-07-07 MED ORDER — DEXTROSE 5 % IV SOLN
2.0000 g | Freq: Three times a day (TID) | INTRAVENOUS | Status: DC
  Administered 2015-07-07: 1 g via INTRAVENOUS
  Administered 2015-07-07: 11:00:00 via INTRAVENOUS
  Filled 2015-07-07 (×3): qty 2

## 2015-07-07 MED ORDER — ONDANSETRON HCL 4 MG/2ML IJ SOLN: 4.0000 mg | Freq: Four times a day (QID) | INTRAMUSCULAR | Status: DC | PRN

## 2015-07-07 MED ORDER — HYDRALAZINE HCL 20 MG/ML IJ SOLN: 10.0000 mg | INTRAMUSCULAR | Status: DC | PRN

## 2015-07-07 MED ORDER — MORPHINE SULFATE 2 MG/ML IV SOLN
INTRAVENOUS | Status: AC
  Filled 2015-07-07: qty 25

## 2015-07-07 MED ORDER — LIDOCAINE HCL (CARDIAC) 20 MG/ML IV SOLN
INTRAVENOUS | Status: DC | PRN
  Administered 2015-07-07: 50 mg via INTRAVENOUS

## 2015-07-07 MED ORDER — LACTATED RINGERS IV SOLN
INTRAVENOUS | Status: DC
  Administered 2015-07-07 – 2015-07-08 (×3): via INTRAVENOUS

## 2015-07-07 MED ORDER — DEXTROSE 5 % IV SOLN
2.0000 g | Freq: Three times a day (TID) | INTRAVENOUS | Status: DC
  Administered 2015-07-07 – 2015-07-09 (×5): 2 g via INTRAVENOUS
  Filled 2015-07-07 (×7): qty 2

## 2015-07-07 MED ORDER — ONDANSETRON HCL 4 MG/2ML IJ SOLN
INTRAMUSCULAR | Status: DC | PRN
  Administered 2015-07-07: 4 mg via INTRAVENOUS

## 2015-07-07 MED ORDER — SODIUM CHLORIDE 0.9 % IV SOLN
INTRAVENOUS | Status: AC
  Administered 2015-07-07: 15:00:00 via INTRAVENOUS

## 2015-07-07 MED ORDER — CETYLPYRIDINIUM CHLORIDE 0.05 % MT LIQD
7.0000 mL | Freq: Two times a day (BID) | OROMUCOSAL | Status: DC
  Administered 2015-07-08 – 2015-07-09 (×4): 7 mL via OROMUCOSAL

## 2015-07-07 MED ORDER — HYDROMORPHONE HCL 1 MG/ML IJ SOLN
0.5000 mg | Freq: Once | INTRAMUSCULAR | Status: AC
  Administered 2015-07-07: 0.5 mg via INTRAVENOUS
  Filled 2015-07-07: qty 1

## 2015-07-07 MED ORDER — ONDANSETRON 4 MG PO TBDP
4.0000 mg | ORAL_TABLET | Freq: Four times a day (QID) | ORAL | Status: DC | PRN
  Filled 2015-07-07: qty 1

## 2015-07-07 MED ORDER — MORPHINE SULFATE 2 MG/ML IV SOLN
INTRAVENOUS | Status: DC
  Administered 2015-07-07: 13:00:00 via INTRAVENOUS
  Administered 2015-07-07: 2 mg via INTRAVENOUS
  Administered 2015-07-07: 1 mg via INTRAVENOUS
  Administered 2015-07-07: 3 mg via INTRAVENOUS
  Administered 2015-07-08: 0 mg via INTRAVENOUS
  Administered 2015-07-08: 1 mg via INTRAVENOUS
  Administered 2015-07-08: 3 mg via INTRAVENOUS
  Administered 2015-07-08 – 2015-07-09 (×3): 1 mg via INTRAVENOUS
  Administered 2015-07-09: 0 mg via INTRAVENOUS
  Administered 2015-07-09: 2 mg via INTRAVENOUS

## 2015-07-07 MED ORDER — PROPOFOL 10 MG/ML IV BOLUS
INTRAVENOUS | Status: DC | PRN
  Administered 2015-07-07: 125 mg via INTRAVENOUS

## 2015-07-07 MED ORDER — DIPHENHYDRAMINE HCL 50 MG/ML IJ SOLN: 12.5000 mg | Freq: Four times a day (QID) | INTRAMUSCULAR | Status: DC | PRN

## 2015-07-07 MED ORDER — MORPHINE SULFATE (PF) 4 MG/ML IV SOLN
4.0000 mg | Freq: Once | INTRAVENOUS | Status: AC
  Administered 2015-07-07: 4 mg via INTRAVENOUS
  Filled 2015-07-07: qty 1

## 2015-07-07 MED ORDER — SODIUM CHLORIDE 0.9 % IV SOLN
INTRAVENOUS | Status: DC
  Administered 2015-07-07: 10:00:00 via INTRAVENOUS

## 2015-07-07 MED ORDER — FENTANYL CITRATE (PF) 100 MCG/2ML IJ SOLN
INTRAMUSCULAR | Status: AC
  Filled 2015-07-07: qty 2

## 2015-07-07 MED ORDER — DEXTROSE 5 % IV SOLN
INTRAVENOUS | Status: AC
  Administered 2015-07-07: 18:00:00
  Filled 2015-07-07: qty 1

## 2015-07-07 MED ORDER — FENTANYL CITRATE (PF) 100 MCG/2ML IJ SOLN
25.0000 ug | INTRAMUSCULAR | Status: DC | PRN
  Administered 2015-07-07 (×4): 25 ug via INTRAVENOUS

## 2015-07-07 MED ORDER — MIDAZOLAM HCL 2 MG/2ML IJ SOLN
INTRAMUSCULAR | Status: AC
  Filled 2015-07-07: qty 4

## 2015-07-07 MED ORDER — PROPOFOL 10 MG/ML IV BOLUS
INTRAVENOUS | Status: AC
  Filled 2015-07-07: qty 20

## 2015-07-07 MED ORDER — SUGAMMADEX SODIUM 500 MG/5ML IV SOLN
INTRAVENOUS | Status: AC
  Filled 2015-07-07: qty 5

## 2015-07-07 MED ORDER — LABETALOL HCL 5 MG/ML IV SOLN
5.0000 mg | Freq: Once | INTRAVENOUS | Status: AC
  Administered 2015-07-07: 5 mg via INTRAVENOUS

## 2015-07-07 MED ORDER — EPHEDRINE SULFATE 50 MG/ML IJ SOLN
INTRAMUSCULAR | Status: DC | PRN
  Administered 2015-07-07: 10 mg via INTRAVENOUS

## 2015-07-07 MED ORDER — FENTANYL CITRATE (PF) 250 MCG/5ML IJ SOLN
INTRAMUSCULAR | Status: AC
  Filled 2015-07-07: qty 5

## 2015-07-07 MED ORDER — SODIUM CHLORIDE 0.9 % IJ SOLN: 9.0000 mL | INTRAMUSCULAR | Status: DC | PRN

## 2015-07-07 MED ORDER — ONDANSETRON HCL 4 MG/2ML IJ SOLN: 4.0000 mg | Freq: Once | INTRAMUSCULAR | Status: DC | PRN

## 2015-07-07 MED ORDER — IOHEXOL 300 MG/ML  SOLN
100.0000 mL | Freq: Once | INTRAMUSCULAR | Status: AC | PRN
  Administered 2015-07-07: 100 mL via INTRAVENOUS

## 2015-07-07 MED ORDER — ONDANSETRON HCL 4 MG/2ML IJ SOLN
4.0000 mg | Freq: Four times a day (QID) | INTRAMUSCULAR | Status: DC | PRN
  Administered 2015-07-13: 4 mg via INTRAVENOUS
  Filled 2015-07-07: qty 2

## 2015-07-07 SURGICAL SUPPLY — 48 items
BLADE SURG ROTATE 9660 (MISCELLANEOUS) IMPLANT
CANISTER SUCTION 2500CC (MISCELLANEOUS) ×3 IMPLANT
CHLORAPREP W/TINT 26ML (MISCELLANEOUS) ×3 IMPLANT
COVER MAYO STAND STRL (DRAPES) ×3 IMPLANT
COVER SURGICAL LIGHT HANDLE (MISCELLANEOUS) ×3 IMPLANT
DRAPE LAPAROSCOPIC ABDOMINAL (DRAPES) ×3 IMPLANT
DRAPE PROXIMA HALF (DRAPES) IMPLANT
DRAPE UTILITY XL STRL (DRAPES) ×6 IMPLANT
DRAPE WARM FLUID 44X44 (DRAPE) ×3 IMPLANT
DRSG OPSITE POSTOP 4X10 (GAUZE/BANDAGES/DRESSINGS) IMPLANT
DRSG OPSITE POSTOP 4X8 (GAUZE/BANDAGES/DRESSINGS) ×3 IMPLANT
ELECT BLADE 6.5 EXT (BLADE) IMPLANT
ELECT CAUTERY BLADE 6.4 (BLADE) ×6 IMPLANT
ELECT REM PT RETURN 9FT ADLT (ELECTROSURGICAL) ×3
ELECTRODE REM PT RTRN 9FT ADLT (ELECTROSURGICAL) ×1 IMPLANT
GLOVE BIO SURGEON STRL SZ7 (GLOVE) ×6 IMPLANT
GLOVE BIOGEL PI IND STRL 6.5 (GLOVE) ×2 IMPLANT
GLOVE BIOGEL PI IND STRL 7.5 (GLOVE) ×4 IMPLANT
GLOVE BIOGEL PI INDICATOR 6.5 (GLOVE) ×4
GLOVE BIOGEL PI INDICATOR 7.5 (GLOVE) ×8
GLOVE ECLIPSE 7.5 STRL STRAW (GLOVE) ×9 IMPLANT
GOWN STRL REUS W/ TWL LRG LVL3 (GOWN DISPOSABLE) ×4 IMPLANT
GOWN STRL REUS W/TWL LRG LVL3 (GOWN DISPOSABLE) ×12
KIT BASIN OR (CUSTOM PROCEDURE TRAY) ×3 IMPLANT
KIT ROOM TURNOVER OR (KITS) ×3 IMPLANT
LIGASURE IMPACT 36 18CM CVD LR (INSTRUMENTS) ×3 IMPLANT
NS IRRIG 1000ML POUR BTL (IV SOLUTION) ×6 IMPLANT
PACK GENERAL/GYN (CUSTOM PROCEDURE TRAY) ×3 IMPLANT
PAD ARMBOARD 7.5X6 YLW CONV (MISCELLANEOUS) ×3 IMPLANT
PENCIL BUTTON HOLSTER BLD 10FT (ELECTRODE) IMPLANT
RELOAD PROXIMATE 75MM BLUE (ENDOMECHANICALS) ×6 IMPLANT
SPECIMEN JAR LARGE (MISCELLANEOUS) IMPLANT
SPONGE LAP 18X18 X RAY DECT (DISPOSABLE) IMPLANT
STAPLER GUN LINEAR PROX 60 (STAPLE) ×3 IMPLANT
STAPLER PROXIMATE 75MM BLUE (STAPLE) ×3 IMPLANT
STAPLER VISISTAT 35W (STAPLE) ×3 IMPLANT
SUCTION POOLE TIP (SUCTIONS) ×3 IMPLANT
SUT PDS AB 1 TP1 96 (SUTURE) ×6 IMPLANT
SUT SILK 2 0 SH CR/8 (SUTURE) ×3 IMPLANT
SUT SILK 2 0 TIES 10X30 (SUTURE) ×3 IMPLANT
SUT SILK 3 0 SH CR/8 (SUTURE) ×3 IMPLANT
SUT SILK 3 0 TIES 10X30 (SUTURE) ×3 IMPLANT
SUT VIC AB 3-0 SH 18 (SUTURE) IMPLANT
TOWEL OR 17X26 10 PK STRL BLUE (TOWEL DISPOSABLE) ×3 IMPLANT
TRAY FOLEY CATH 16FRSI W/METER (SET/KITS/TRAYS/PACK) ×3 IMPLANT
TUBE CONNECTING 12'X1/4 (SUCTIONS)
TUBE CONNECTING 12X1/4 (SUCTIONS) IMPLANT
YANKAUER SUCT BULB TIP NO VENT (SUCTIONS) IMPLANT

## 2015-07-07 NOTE — Anesthesia Procedure Notes (Signed)
Procedure Name: Intubation Date/Time: 07/07/2015 10:55 AM Performed by: Neldon Newport Pre-anesthesia Checklist: Patient being monitored, Emergency Drugs available, Suction available, Patient identified and Timeout performed Patient Re-evaluated:Patient Re-evaluated prior to inductionOxygen Delivery Method: Circle system utilized Preoxygenation: Pre-oxygenation with 100% oxygen Intubation Type: IV induction Ventilation: Mask ventilation without difficulty Laryngoscope Size: Mac and 3 Grade View: Grade I Tube type: Oral Tube size: 7.0 mm Number of attempts: 1 Placement Confirmation: positive ETCO2,  ETT inserted through vocal cords under direct vision and breath sounds checked- equal and bilateral Secured at: 22 cm Tube secured with: Tape Dental Injury: Teeth and Oropharynx as per pre-operative assessment

## 2015-07-07 NOTE — Anesthesia Postprocedure Evaluation (Signed)
  Anesthesia Post-op Note  Patient: Anne [name]  Procedure(s) Performed: Procedure(s) (LRB): EXPLORATORY LAPAROTOMY WITH ILEOCECECTOMY  (N/A)  Patient Location: PACU  Anesthesia Type: General  Level of Consciousness: awake and alert   Airway and Oxygen Therapy: Patient Spontanous Breathing  Post-op Pain: mild  Post-op Assessment: Post-op Vital signs reviewed, Patient's Cardiovascular Status Stable, Respiratory Function Stable, Patent Airway and No signs of Nausea or vomiting  Last Vitals:  Filed Vitals:   07/07/15 1341  BP: 172/89  Pulse:   Temp:   Resp:     Post-op Vital Signs: stable   Complications: No apparent anesthesia complications

## 2015-07-07 NOTE — ED Notes (Signed)
Unable to give urine specimen at this time , NS IV bolus infusing .

## 2015-07-07 NOTE — Anesthesia Preprocedure Evaluation (Addendum)
Anesthesia Evaluation  Patient identified by MRN, date of birth, ID band Patient awake    Reviewed: Allergy & Precautions, NPO status , Patient's Chart, lab work & pertinent test results  History of Anesthesia Complications (+) PROLONGED EMERGENCE and history of anesthetic complications  Airway Mallampati: II  TM Distance: >3 FB Neck ROM: Full    Dental no notable dental hx. (+) Dental Advisory Given, Edentulous Upper, Upper Dentures   Pulmonary neg pulmonary ROS,    Pulmonary exam normal breath sounds clear to auscultation       Cardiovascular hypertension, Pt. on medications Normal cardiovascular exam Rhythm:Regular Rate:Normal     Neuro/Psych negative neurological ROS  negative psych ROS   GI/Hepatic Neg liver ROS, hiatal hernia,   Endo/Other  negative endocrine ROS  Renal/GU negative Renal ROS  negative genitourinary   Musculoskeletal negative musculoskeletal ROS (+)   Abdominal   Peds negative pediatric ROS (+)  Hematology negative hematology ROS (+)   Anesthesia Other Findings   Reproductive/Obstetrics negative OB ROS                            Anesthesia Physical Anesthesia Plan  ASA: II  Anesthesia Plan: General   Post-op Pain Management:    Induction: Intravenous and Rapid sequence  Airway Management Planned: Oral ETT  Additional Equipment:   Intra-op Plan:   Post-operative Plan: Extubation in OR and Possible Post-op intubation/ventilation  Informed Consent: I have reviewed the patients History and Physical, chart, labs and discussed the procedure including the risks, benefits and alternatives for the proposed anesthesia with the patient or authorized representative who has indicated his/her understanding and acceptance.   Dental advisory given  Plan Discussed with: CRNA  Anesthesia Plan Comments: (Hx of delayed emergence, discussed possible post op ventilation  with family. Patient was very sedated. Discussed all r/b/o with patients family. They consented and all questions answered)       Anesthesia Quick Evaluation

## 2015-07-07 NOTE — Progress Notes (Signed)
pts temp at 1824 99.5 and at 2204 was 100.7 MD notified and stated to call when temp was greater than 101 before he puts in Tylenol order,

## 2015-07-07 NOTE — ED Provider Notes (Signed)
CSN: [id number]     Arrival date & time 07/07/15  0559 History   First MD Initiated Contact with Patient 07/07/15 0601     Chief Complaint  Patient presents with  . Abdominal Pain     (Consider location/radiation/quality/duration/timing/severity/associated sxs/prior Treatment) The history is provided by the patient and medical records. No language interpreter was used.     Anne [name] is a 69 y.o. female  with a hx of HTN, diverticulosis presents to the Emergency Department complaining of gradual, persistent, progressively worsening RLQ abd pain onset 4am.  Pt reports the pain woke her from sleep. Associated symptoms include nausea, vomiting and chills.  Pt reports Pt reports she has had a partial hysterectomy with the left ovary remaining. Pt describes the pain as sharp and stabbing rated at a 10/10 without radiation.  Nothing makes it better and nothing makes it worse.  Patient denies a history of AAA or kidney stones.   He reports some hematuria approximately one week ago which resolved any dysuria, urinary urgency or urinary frequency. Pt denies fever, chills, headache, neck pain, chest pain, shortness of breath, weakness and dizziness, syncope, dysuria, vaginal discharge.  Last oral intake was 6 PM last night.   Past Medical History  Diagnosis Date  . Hypertension   . Hypercholesterolemia   . Vitamin D deficiency   . Osteopenia   . Diverticulosis   . Hiatal hernia   . Complication of anesthesia     slow to wake up   Past Surgical History  Procedure Laterality Date  . Fibrocystic breast excision    . Tubal ligation    . Abdominal hysterectomy      Partial   Family History  Problem Relation Age of Onset  . Stroke Mother   . Hypertension Mother   . Stroke Brother   . Cancer Neg Hx   . Alcohol abuse Neg Hx   . Early death Neg Hx   . Hearing loss Neg Hx   . Heart disease Neg Hx   . Hyperlipidemia Neg Hx   . Kidney disease Neg Hx    Social History  Substance Use  Topics  . Smoking status: Never Smoker   . Smokeless tobacco: Never Used  . Alcohol Use: Yes     Comment: 3 times a year   OB History    No data available     Review of Systems  Constitutional: Negative for fever, diaphoresis, appetite change, fatigue and unexpected weight change.  HENT: Negative for mouth sores.   Eyes: Negative for visual disturbance.  Respiratory: Negative for cough, chest tightness, shortness of breath and wheezing.   Cardiovascular: Negative for chest pain.  Gastrointestinal: Positive for nausea, vomiting and abdominal pain. Negative for diarrhea and constipation.  Endocrine: Negative for polydipsia, polyphagia and polyuria.  Genitourinary: Negative for dysuria, urgency, frequency and hematuria.  Musculoskeletal: Negative for back pain and neck stiffness.  Skin: Negative for rash.  Allergic/Immunologic: Negative for immunocompromised state.  Neurological: Negative for syncope, light-headedness and headaches.  Hematological: Does not bruise/bleed easily.  Psychiatric/Behavioral: Negative for sleep disturbance. The patient is not nervous/anxious.       Allergies  Asa; Diltiazem hcl; Naproxen; Norvasc; Triamterene-hctz; Anesthetics, amide; Indomethacin; Other; Tenex; Hytrin; Lisinopril; and Penicillins  Home Medications   Prior to Admission medications   Medication Sig Start Date End Date Taking? Authorizing Provider  aMILoride (MIDAMOR) 5 MG tablet Take 5 mg by mouth daily. 06/23/15  Yes Historical Provider, MD  Calcium 500 MG  tablet Take 600 mg by mouth daily.   Yes Historical Provider, MD  cholecalciferol (VITAMIN D) 1000 UNITS tablet Take 1,000 Units by mouth daily.   Yes Historical Provider, MD  GARLIC PO Take 1 tablet by mouth daily.   Yes Historical Provider, MD  magnesium gluconate (MAGONATE) 500 MG tablet Take 500 mg by mouth daily.   Yes Historical Provider, MD   BP 174/86 mmHg  Pulse 76  Temp(Src) 98.4 F (36.9 C) (Oral)  Resp 11  Ht 5'  4.75" (1.645 m)  Wt 138 lb (62.596 kg)  BMI 23.13 kg/m2  SpO2 98% Physical Exam  Constitutional: She appears well-developed and well-nourished.  HENT:  Head: Normocephalic and atraumatic.  Mouth/Throat: Oropharynx is clear and moist.  Eyes: Conjunctivae are normal. No scleral icterus.  Cardiovascular: Normal rate, regular rhythm, normal heart sounds and intact distal pulses.   No murmur heard. Pulmonary/Chest: Effort normal and breath sounds normal.  Abdominal: Soft. Bowel sounds are normal. She exhibits no distension and no mass. There is tenderness in the right lower quadrant. There is guarding and tenderness at McBurney's point. There is no rebound and no CVA tenderness.  No palpable mass or pulsatile mass Rebound and guarding in the right lower quadrant, localized to the site  Neurological: She is alert.  Skin: Skin is warm and dry.  Psychiatric: She has a normal mood and affect.  Nursing note and vitals reviewed.   ED Course  Procedures (including critical care time) Labs Review Labs Reviewed  CBC WITH DIFFERENTIAL/PLATELET - Abnormal; Notable for the following:    Hemoglobin 11.7 (*)    HCT 34.0 (*)    All other components within normal limits  COMPREHENSIVE METABOLIC PANEL - Abnormal; Notable for the following:    Glucose, Bld 123 (*)    ALT 13 (*)    GFR calc non Af Amer 57 (*)    All other components within normal limits  URINALYSIS, ROUTINE W REFLEX MICROSCOPIC (NOT AT [facility]) - Abnormal; Notable for the following:    Ketones, ur 15 (*)    All other components within normal limits  LIPASE, BLOOD  I-STAT CG4 LACTIC ACID, ED  I-STAT TROPOININ, ED  I-STAT CG4 LACTIC ACID, ED    Imaging Review Ct Abdomen Pelvis W Contrast  07/07/2015  CLINICAL DATA:  69 year old female with acute right-sided abdominal and pelvic pain with nausea and vomiting today. EXAM: CT ABDOMEN AND PELVIS WITH CONTRAST TECHNIQUE: Multidetector CT imaging of the abdomen and pelvis was performed  using the standard protocol following bolus administration of intravenous contrast. CONTRAST:  138mL OMNIPAQUE IOHEXOL 300 MG/ML  SOLN COMPARISON:  04/06/2014 and prior CT FINDINGS: Lower chest:  No acute abnormality. Hepatobiliary: The liver and gallbladder are unremarkable. There is no evidence of biliary dilatation. Pancreas: 7 and 9 mm pancreatic cysts are unchanged. Spleen: Unremarkable Adrenals/Urinary Tract: Small bilateral renal cysts are present. No other renal abnormalities identified. Stomach/Bowel: A loop of mildly distended thick-walled distal ileum within the right pelvis is noted with adjacent inflammation. There are apparent transition points on both the proximal and distal aspects of this small bowel loop and may represent a focal volvulus or internal hernia/ early closed loop obstruction. The small bowel proximal to this abnormal loop is not dilated. There is no evidence of pneumoperitoneum or focal abscess. There is no evidence of appendiceal enlargement wall thickening. Vascular/Lymphatic: No enlarged lymph nodes identified. Aortic atherosclerotic calcifications noted without aneurysm. Reproductive: Patient is status post hysterectomy. No adnexal masses identified. Other:  Small amount of free fluid in the pelvis is present. Musculoskeletal: No acute or suspicious abnormalities. IMPRESSION: Abnormal thick walled loop of distal ileum within the right pelvis with adjacent inflammation and small amount of free pelvic fluid. Apparent transition points in both proximal and distal aspects of this loop are noted and a focal volvulus or internal hernia/ early closed loop obstruction is not excluded. The small bowel proximal to this loop is not significantly dilated. No evidence of pneumoperitoneum or abscess. No evidence of appendicitis. Electronically Signed   By: Margarette Canada M.D.   On: 07/07/2015 07:37   I have personally reviewed and evaluated these images and lab results as part of my medical  decision-making.   EKG Interpretation None      MDM   Final diagnoses:  Small bowel obstruction (HCC)  Right lower quadrant abdominal pain  Non-intractable vomiting with nausea, vomiting of unspecified type    [name] presents with RLQ abd pain.  Guarding and rebound on exam.  Record review shows no AAA on a CT scan approx 1 year ago.  Pt denies Hx of this.     6:51 AM Pt now with increasing pain , diaphoresis and radiation of pain into the chest.  Pt evaluated by Dr. Stark Jock who recommends CT non contrast.    8:08 AM Labs are reassuring.  Troponin negative. Normal lactic acid. No Leukocytosis.  CT scan with possible focal volvulus or internal hernia/closed-loop obstruction in the small bowel.  No evidence of abscess or appendicitis. Patient continues to have pain, worse in the right lower quadrant. Will consult surgery and place NG tube.  8:19 AM Discussed with Surgery who will evaluate.    9:15 AM Surgery to admit and plan for surgery this morning.  BP 174/86 mmHg  Pulse 76  Temp(Src) 98.4 F (36.9 C) (Oral)  Resp 11  Ht 5' 4.75" (1.645 m)  Wt 138 lb (62.596 kg)  BMI 23.13 kg/m2  SpO2 98%   Abigail Butts, PA-C 07/07/15 [address], PA-C 07/11/15 1949  Veryl Speak, MD 07/13/15 2142

## 2015-07-07 NOTE — ED Notes (Addendum)
Pt. woke up this morning with RLQ pain , nausea and emesis , received Fentanyl 100 mcg and Zofran 4 mg IV by EMS with relief . No nausea at arrival , denies fever or chills.

## 2015-07-07 NOTE — Transfer of Care (Signed)
Immediate Anesthesia Transfer of Care Note  Patient: Anne [name]  Procedure(s) Performed: Procedure(s): EXPLORATORY LAPAROTOMY WITH ILEOCECECTOMY  (N/A)  Patient Location: PACU  Anesthesia Type:General  Level of Consciousness: awake, alert  and oriented  Airway & Oxygen Therapy: Patient Spontanous Breathing and Patient connected to face mask oxygen  Post-op Assessment: Report given to RN, Post -op Vital signs reviewed and stable and Patient moving all extremities X 4  Post vital signs: Reviewed and stable  Last Vitals:  Filed Vitals:   07/07/15 0917  BP: 174/86  Pulse: 76  Temp:   Resp: 11    Complications: No apparent anesthesia complications

## 2015-07-07 NOTE — H&P (Signed)
Anne [name] Aug 04, 1946  [id number].   Chief Complaint/Reason for Consult: sbo HPI: This is a very pleasant 69 yo black female who has a h/o HTN who acutely woke up at 0400am with severe RLQ abdominal pain.  She states this was sharp and stabbing.  Worse than labor pain or any other pain she's ever had.  She hasn't had flatus this morning.  Had a BM last night.  Nothing makes her pain better except narcotics here in the ED.  She admits to nausea and vomiting, but no blood seen in her stool or emesis.  Her son notes she has had some recent weight loss.  She did have blood in her urine last week and she saw her PCP for this, but it has since resolved.  Upon arrival to the ED, she had a CT scan that questions a closed loop small bowel obstruction.  We have been asked to see her for admission.  ROS: Please see HPI, otherwise unable to obtain a full review of systems as the patient can not stay awake due to the pain meds given.  Family History  Problem Relation Age of Onset  . Stroke Mother   . Hypertension Mother   . Stroke Brother   . Cancer Neg Hx   . Alcohol abuse Neg Hx   . Early death Neg Hx   . Hearing loss Neg Hx   . Heart disease Neg Hx   . Hyperlipidemia Neg Hx   . Kidney disease Neg Hx     Past Medical History  Diagnosis Date  . Hypertension   . Hypercholesterolemia   . Vitamin D deficiency   . Osteopenia   . Diverticulosis   . Hiatal hernia     Past Surgical History  Procedure Laterality Date  . Fibrocystic breast excision    . Tubal ligation    . Abdominal hysterectomy      Partial    Social History:  reports that she has never smoked. She has never used smokeless tobacco. She reports that she drinks alcohol. She reports that she does not use illicit drugs.  Allergies:  Allergies  Allergen Reactions  . Asa [Aspirin] Other (See Comments)    Causes stomach issues  . Diltiazem Hcl Nausea And Vomiting    Hospitalized when it occurred   . Naproxen Other (See  Comments)    Gastric intolerance  . Norvasc [Amlodipine Besylate] Swelling    Severe edema / Headaches   . Triamterene-Hctz Other (See Comments)    Acute renal failure, hypokalemia   . Anesthetics, Amide   . Indomethacin     Esphogitis  . Other     Very hard to awake from anaesthetic with surgery.  Kipp Laurence [Guanfacine Hcl] Other (See Comments)    Chronic insomnia   . Hytrin [Terazosin Hcl] Other (See Comments)    Pt states she can not take this med but can not relate any side effect or incident that occurred  . Lisinopril Other (See Comments)    Unknown, pt says she "can't take it", cough/hypotension and thirst  . Penicillins Itching     (Not in a hospital admission)  Blood pressure 174/79, pulse 74, temperature 98.4 F (36.9 C), temperature source Oral, resp. rate 11, height 5' 4.75" (1.645 m), weight 62.596 kg (138 lb), SpO2 98 %. Physical Exam: General: pleasant, WD, WN black female who is laying in bed in NAD, but somnolent HEENT: head is normocephalic, atraumatic.  Sclera are noninjected.  PERRL.  Ears and nose without any masses or lesions.  Mouth is pink and moist Heart: regular, rate, and rhythm.  Normal s1,s2. No obvious murmurs, gallops, or rubs noted.  Palpable radial and pedal pulses bilaterally Lungs: CTAB, no wheezes, rhonchi, or rales noted.  Respiratory effort nonlabored Abd: soft, tender greatest in RLQ, but physical exam degraded secondary to pain medications, ND, hypoactive BS, no masses, hernias, or organomegaly MS: all 4 extremities are symmetrical with no cyanosis, clubbing, or edema. Skin: warm and dry with no masses, lesions, or rashes Psych: somnolent but arouses, oriented x3    Results for orders placed or performed during the hospital encounter of 07/07/15 (from the past 48 hour(s))  CBC with Differential/Platelet     Status: Abnormal   Collection Time: 07/07/15  6:15 AM  Result Value Ref Range   WBC 4.1 4.0 - 10.5 K/uL   RBC 4.12 3.87 - 5.11  MIL/uL   Hemoglobin 11.7 (L) 12.0 - 15.0 g/dL   HCT 34.0 (L) 36.0 - 46.0 %   MCV 82.5 78.0 - 100.0 fL   MCH 28.4 26.0 - 34.0 pg   MCHC 34.4 30.0 - 36.0 g/dL   RDW 14.4 11.5 - 15.5 %   Platelets 161 150 - 400 K/uL   Neutrophils Relative % 45 %   Neutro Abs 1.9 1.7 - 7.7 K/uL   Lymphocytes Relative 46 %   Lymphs Abs 1.9 0.7 - 4.0 K/uL   Monocytes Relative 6 %   Monocytes Absolute 0.3 0.1 - 1.0 K/uL   Eosinophils Relative 2 %   Eosinophils Absolute 0.1 0.0 - 0.7 K/uL   Basophils Relative 1 %   Basophils Absolute 0.0 0.0 - 0.1 K/uL  Comprehensive metabolic panel     Status: Abnormal   Collection Time: 07/07/15  6:15 AM  Result Value Ref Range   Sodium 137 135 - 145 mmol/L   Potassium 3.6 3.5 - 5.1 mmol/L   Chloride 103 101 - 111 mmol/L   CO2 23 22 - 32 mmol/L   Glucose, Bld 123 (H) 65 - 99 mg/dL   BUN 14 6 - 20 mg/dL   Creatinine, Ser 1.00 0.44 - 1.00 mg/dL   Calcium 9.3 8.9 - 10.3 mg/dL   Total Protein 6.5 6.5 - 8.1 g/dL   Albumin 3.7 3.5 - 5.0 g/dL   AST 22 15 - 41 U/L   ALT 13 (L) 14 - 54 U/L   Alkaline Phosphatase 58 38 - 126 U/L   Total Bilirubin 0.6 0.3 - 1.2 mg/dL   GFR calc non Af Amer 57 (L) >60 mL/min   GFR calc Af Amer >60 >60 mL/min    Comment: (NOTE) The eGFR has been calculated using the CKD EPI equation. This calculation has not been validated in all clinical situations. eGFR's persistently <60 mL/min signify possible Chronic Kidney Disease.    Anion gap 11 5 - 15  Lipase, blood     Status: None   Collection Time: 07/07/15  6:15 AM  Result Value Ref Range   Lipase 30 11 - 51 U/L    Comment: Please note change in reference range.  I-Stat CG4 Lactic Acid, ED     Status: None   Collection Time: 07/07/15  6:20 AM  Result Value Ref Range   Lactic Acid, Venous 0.71 0.5 - 2.0 mmol/L  I-stat troponin, ED     Status: None   Collection Time: 07/07/15  6:59 AM  Result Value Ref Range   Troponin i, poc  0.00 0.00 - 0.08 ng/mL   Comment 3            Comment: Due  to the release kinetics of cTnI, a negative result within the first hours of the onset of symptoms does not rule out myocardial infarction with certainty. If myocardial infarction is still suspected, repeat the test at appropriate intervals.   Urinalysis, Routine w reflex microscopic (not at [facility])     Status: Abnormal   Collection Time: 07/07/15  7:33 AM  Result Value Ref Range   Color, Urine YELLOW YELLOW   APPearance CLEAR CLEAR   Specific Gravity, Urine 1.020 1.005 - 1.030   pH 7.0 5.0 - 8.0   Glucose, UA NEGATIVE NEGATIVE mg/dL   Hgb urine dipstick NEGATIVE NEGATIVE   Bilirubin Urine NEGATIVE NEGATIVE   Ketones, ur 15 (A) NEGATIVE mg/dL   Protein, ur NEGATIVE NEGATIVE mg/dL   Urobilinogen, UA 0.2 0.0 - 1.0 mg/dL   Nitrite NEGATIVE NEGATIVE   Leukocytes, UA NEGATIVE NEGATIVE    Comment: MICROSCOPIC NOT DONE ON URINES WITH NEGATIVE PROTEIN, BLOOD, LEUKOCYTES, NITRITE, OR GLUCOSE <1000 mg/dL.   Ct Abdomen Pelvis W Contrast  07/07/2015  CLINICAL DATA:  69 year old female with acute right-sided abdominal and pelvic pain with nausea and vomiting today. EXAM: CT ABDOMEN AND PELVIS WITH CONTRAST TECHNIQUE: Multidetector CT imaging of the abdomen and pelvis was performed using the standard protocol following bolus administration of intravenous contrast. CONTRAST:  123mL OMNIPAQUE IOHEXOL 300 MG/ML  SOLN COMPARISON:  04/06/2014 and prior CT FINDINGS: Lower chest:  No acute abnormality. Hepatobiliary: The liver and gallbladder are unremarkable. There is no evidence of biliary dilatation. Pancreas: 7 and 9 mm pancreatic cysts are unchanged. Spleen: Unremarkable Adrenals/Urinary Tract: Small bilateral renal cysts are present. No other renal abnormalities identified. Stomach/Bowel: A loop of mildly distended thick-walled distal ileum within the right pelvis is noted with adjacent inflammation. There are apparent transition points on both the proximal and distal aspects of this small bowel loop and  may represent a focal volvulus or internal hernia/ early closed loop obstruction. The small bowel proximal to this abnormal loop is not dilated. There is no evidence of pneumoperitoneum or focal abscess. There is no evidence of appendiceal enlargement wall thickening. Vascular/Lymphatic: No enlarged lymph nodes identified. Aortic atherosclerotic calcifications noted without aneurysm. Reproductive: Patient is status post hysterectomy. No adnexal masses identified. Other: Small amount of free fluid in the pelvis is present. Musculoskeletal: No acute or suspicious abnormalities. IMPRESSION: Abnormal thick walled loop of distal ileum within the right pelvis with adjacent inflammation and small amount of free pelvic fluid. Apparent transition points in both proximal and distal aspects of this loop are noted and a focal volvulus or internal hernia/ early closed loop obstruction is not excluded. The small bowel proximal to this loop is not significantly dilated. No evidence of pneumoperitoneum or abscess. No evidence of appendicitis. Electronically Signed   By: Margarette Canada M.D.   On: 07/07/2015 07:37       Assessment/Plan 1. CLosed loop small bowel obstruction -admit, IVFs, plan for urgent OR for ex lap with LOA and possible SBR -this was discussed in detail with the patient's son.  The patient was sleeping and heard some of this, but is unable to given consent.  The son understands and is agreeable to proceed. -cont NPO and NGT 2. HTN -hold BP meds secondary to NGT, will give prn meds IV  Kolyn Rozario E 07/07/2015, 9:08 AM Pager: [phone]

## 2015-07-07 NOTE — ED Notes (Signed)
Pt currently in ct

## 2015-07-07 NOTE — Op Note (Signed)
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 Anesthesia: Gen. endotracheal Indications: This is a 69 year old female with a past surgical history of abdominal hysterectomy who presents with several hours of acute severe abdominal pain. This was localized more in her right lower quadrant. She presented to the emergency department for evaluation. A CT scan showed findings suspicious for a closed loop small bowel obstruction. Due to the severity of her symptoms and the appearance of the CT scan we recommended immediate exploratory laparotomy.  Description of procedure: The patient brought to the operating room placed in supine position on the operating room table. After an adequate level of general anesthesia was obtained, a Foley catheter was placed under sterile technique. The patient's abdomen was prepped with ChloraPrep and draped sterile fashion. A timeout was taken to ensure the proper patient and proper procedure. We made a lower midline incision. Dissection was carried down to the fascia with cautery. We opened the linea alba and dissected into the peritoneal cavity. We open the fascia widely and placed the Balfour retractor for exposure. In the pelvis on the right side there is a loop of small bowel that is obviously infarcted. There is no sign of perforation. The proximal small bowel is mildly dilated. The distal small bowel is completely decompressed. We exposed the area of the adhesion in the right lower quadrant and lysed this with Metzenbaum scissors. We were able to deliver the infarcted loop small bowel up into the wound. Is obvious that this area will need to be resected. I mobilized the cecum and right colon from the lateral attachments. We divided the small bowel proximal to the segment of infarction which measures  about 20 cm. We divided with a GIA-75 stapler. The LigaSure device was used to take down the mesentery. We divided the colon in the mid right colon. The ileocolic vessel was ligated with a 2-0 silk suture. The specimen including the appendix was sent for pathologic examination. We created a side-to-side ileocolic anastomosis with a GIA-75 stapler. The patient's colon is unprepped and it contains some stool. There was no spillage. We closed the common defect with a TA 60 stapler. The mesentery was closed with 2-0 silk sutures. The anastomosis is intact and is widely patent. We irrigated the abdomen thoroughly with several liters of warm saline. There are no other areas of small bowel structure. The nasogastric tube was palpated within the stomach. The liver and gallbladder appear normal. We changed our gown and gloves. The fascia was reapproximated with double-stranded #1 PDS suture. The subcutaneous tissues were irrigated and staples were used to close the skin. The patient was then next made and brought to the recovery room in stable condition. All sponge insturment, and needle counts are correct.  Imogene Burn. Georgette Dover, MD, [facility]  General/ Trauma Surgery  07/07/2015 12:30 PM

## 2015-07-07 NOTE — Progress Notes (Signed)
Orthopedic Tech Progress Note Patient Details:  Anne [name] Nov 15, 1945 [id number]  Ortho Devices Type of Ortho Device: Abdominal binder   Anne [name] 07/07/2015, 2:34 PM

## 2015-07-07 NOTE — ED Notes (Signed)
Patient transported to CT scan . 

## 2015-07-08 LAB — BASIC METABOLIC PANEL
Anion gap: 10 (ref 5–15)
BUN: 6 mg/dL (ref 6–20)
CHLORIDE: 101 mmol/L (ref 101–111)
CO2: 26 mmol/L (ref 22–32)
CREATININE: 0.89 mg/dL (ref 0.44–1.00)
Calcium: 8.4 mg/dL — ABNORMAL LOW (ref 8.9–10.3)
GFR calc Af Amer: >60 mL/min (ref >60)
GFR calc non Af Amer: >60 mL/min (ref >60)
Glucose, Bld: 117 mg/dL — ABNORMAL HIGH (ref 65–99)
Potassium: 3.3 mmol/L — ABNORMAL LOW (ref 3.5–5.1)
Sodium: 137 mmol/L (ref 135–145)

## 2015-07-08 LAB — CBC
HEMATOCRIT: 32.7 % — AB (ref 36.0–46.0)
HEMOGLOBIN: 11.4 g/dL — AB (ref 12.0–15.0)
MCH: 28.7 pg (ref 26.0–34.0)
MCHC: 34.9 g/dL (ref 30.0–36.0)
MCV: 82.4 fL (ref 78.0–100.0)
Platelets: 164 10*3/uL (ref 150–400)
RBC: 3.97 MIL/uL (ref 3.87–5.11)
RDW: 14.3 % (ref 11.5–15.5)
WBC: 8 10*3/uL (ref 4.0–10.5)

## 2015-07-08 MED ORDER — SODIUM CHLORIDE 0.9 % IV SOLN: INTRAVENOUS | Status: DC

## 2015-07-08 MED ORDER — MENTHOL 3 MG MT LOZG
1.0000 | LOZENGE | OROMUCOSAL | Status: DC | PRN
  Administered 2015-07-08: 3 mg via ORAL
  Filled 2015-07-08 (×2): qty 9

## 2015-07-08 MED ORDER — POTASSIUM CHLORIDE 10 MEQ/100ML IV SOLN
10.0000 meq | INTRAVENOUS | Status: AC
  Administered 2015-07-08 (×4): 10 meq via INTRAVENOUS
  Filled 2015-07-08: qty 100

## 2015-07-08 MED ORDER — POTASSIUM CHLORIDE IN NACL 20-0.9 MEQ/L-% IV SOLN
INTRAVENOUS | Status: DC
  Administered 2015-07-08 – 2015-07-11 (×5): via INTRAVENOUS
  Filled 2015-07-08 (×9): qty 1000

## 2015-07-08 NOTE — Progress Notes (Signed)
1 Day Post-Op  Subjective: Stable and alert.  Low-grade fever.  100.7.  Vital signs  Stable.   Urine output quite high.  Will decrease IV rate  Feels much better.  Denies nausea.  Asking for food. NG tube in place I explained to her and her husband that it was too soon to eat and we will leave the NG tube in today.  They're accepting of this Pain control seems adequate.  I explained operative events to her. Hemoglobin 11.4.  WBC 8000.  Potassium 3.3.  BUN 6.   Objective: Vital signs in last 24 hours: Temp:  [97.7 F (36.5 C)-100.7 F (38.2 C)] 100.1 F (37.8 C) (10/29 0522) Pulse Rate:  [73-93] 86 (10/29 0522) Resp:  [7-19] 19 (10/29 0750) BP: (149-188)/(74-107) 153/76 mmHg (10/29 0522) SpO2:  [96 %-100 %] 100 % (10/29 0750) Weight:  [47.718 kg (105 lb 3.2 oz)] 47.718 kg (105 lb 3.2 oz) (10/28 1500)    Intake/Output from previous day: 10/28 0701 - 10/29 0700 In: 3419.2 [P.O.:120; I.V.:3199.2; IV Piggyback:100] Out: 4270 [Urine:3100; Emesis/NG output:190; Blood:50] Intake/Output this shift:    General appearance: Alert.  Cooperative.  Minimal distress.  Appropriate. Resp: clear to auscultation bilaterally GI: Soft.  Silent.  Not distended.  Wound clean.  Honeycomb bandage in place.  Lab Results:  Results for orders placed or performed during the hospital encounter of 07/07/15 (from the past 24 hour(s))  I-Stat CG4 Lactic Acid, ED     Status: None   Collection Time: 07/07/15  9:09 AM  Result Value Ref Range   Lactic Acid, Venous 0.85 0.5 - 2.0 mmol/L  CBC     Status: Abnormal   Collection Time: 07/08/15  6:07 AM  Result Value Ref Range   WBC 8.0 4.0 - 10.5 K/uL   RBC 3.97 3.87 - 5.11 MIL/uL   Hemoglobin 11.4 (L) 12.0 - 15.0 g/dL   HCT 32.7 (L) 36.0 - 46.0 %   MCV 82.4 78.0 - 100.0 fL   MCH 28.7 26.0 - 34.0 pg   MCHC 34.9 30.0 - 36.0 g/dL   RDW 14.3 11.5 - 15.5 %   Platelets 164 150 - 400 K/uL  Basic metabolic panel     Status: Abnormal (Preliminary result)   Collection Time: 07/08/15  6:07 AM  Result Value Ref Range   Sodium PENDING 135 - 145 mmol/L   Potassium 3.3 (L) 3.5 - 5.1 mmol/L   Chloride 101 101 - 111 mmol/L   CO2 26 22 - 32 mmol/L   Glucose, Bld 117 (H) 65 - 99 mg/dL   BUN 6 6 - 20 mg/dL   Creatinine, Ser 0.89 0.44 - 1.00 mg/dL   Calcium 8.4 (L) 8.9 - 10.3 mg/dL   GFR calc non Af Amer >60 >60 mL/min   GFR calc Af Amer >60 >60 mL/min   Anion gap PENDING 5 - 15     Studies/Results: No results found.  Marland Kitchen antiseptic oral rinse  7 mL Mouth Rinse q12n4p  . cefOXitin  2 g Intravenous 3 times per day  . chlorhexidine  15 mL Mouth Rinse BID  . enoxaparin (LOVENOX) injection  30 mg Subcutaneous Q24H  . morphine   Intravenous 6 times per day  . pantoprazole (PROTONIX) IV  40 mg Intravenous QHS  . potassium chloride  10 mEq Intravenous Q1 Hr x 4     Assessment/Plan: s/p Procedure(s): EXPLORATORY LAPAROTOMY WITH ILEOCECECTOMY   Strangulated closed-loop SBO POD #1.  Ileocecectomy. Stable Continue nothing by mouth  with NG tube. Mobilize out of bed Replace potassium Lovenox for DVT prophylaxis.  @PROBHOSP @  LOS: 1 day    Ori Kreiter M 07/08/2015  . .prob

## 2015-07-09 LAB — CBC
HEMATOCRIT: 32.8 % — AB (ref 36.0–46.0)
HEMOGLOBIN: 11 g/dL — AB (ref 12.0–15.0)
MCH: 27.8 pg (ref 26.0–34.0)
MCHC: 33.5 g/dL (ref 30.0–36.0)
MCV: 82.8 fL (ref 78.0–100.0)
Platelets: 162 10*3/uL (ref 150–400)
RBC: 3.96 MIL/uL (ref 3.87–5.11)
RDW: 14.3 % (ref 11.5–15.5)
WBC: 7.2 10*3/uL (ref 4.0–10.5)

## 2015-07-09 LAB — BASIC METABOLIC PANEL
Anion gap: 13 (ref 5–15)
BUN: 8 mg/dL (ref 6–20)
CALCIUM: 8.9 mg/dL (ref 8.9–10.3)
CHLORIDE: 101 mmol/L (ref 101–111)
CO2: 24 mmol/L (ref 22–32)
CREATININE: 1.05 mg/dL — AB (ref 0.44–1.00)
GFR calc Af Amer: >60 mL/min (ref >60)
GFR calc non Af Amer: 54 mL/min — ABNORMAL LOW (ref >60)
Glucose, Bld: 94 mg/dL (ref 65–99)
Potassium: 3.5 mmol/L (ref 3.5–5.1)
SODIUM: 138 mmol/L (ref 135–145)

## 2015-07-09 MED ORDER — HYDROCODONE-ACETAMINOPHEN 5-325 MG PO TABS
1.0000 | ORAL_TABLET | ORAL | Status: DC | PRN
  Administered 2015-07-09 – 2015-07-13 (×7): 2 via ORAL
  Filled 2015-07-09 (×7): qty 2

## 2015-07-09 MED ORDER — MORPHINE SULFATE (PF) 2 MG/ML IV SOLN
1.0000 mg | INTRAVENOUS | Status: DC | PRN
  Filled 2015-07-09: qty 1

## 2015-07-09 MED ORDER — POTASSIUM CHLORIDE 10 MEQ/100ML IV SOLN
10.0000 meq | INTRAVENOUS | Status: AC
  Administered 2015-07-09 (×4): 10 meq via INTRAVENOUS
  Filled 2015-07-09: qty 100

## 2015-07-09 NOTE — Progress Notes (Signed)
2 Days Post-Op  Subjective: Feeling much better.  Says she is very hungry.  Minimal flatus.  No stool.  No nausea. NG output low Alert and stable.  Afebrile.  BP 166/87. WBC 7200.  Hemoglobin 11.0.  Potassium 3.5.  Objective: Vital signs in last 24 hours: Temp:  [98.5 F (36.9 C)-99.7 F (37.6 C)] 98.9 F (37.2 C) (10/30 0630) Pulse Rate:  [82-94] 86 (10/30 0630) Resp:  [14-18] 14 (10/30 0742) BP: (159-167)/(78-87) 166/87 mmHg (10/30 0630) SpO2:  [97 %-100 %] 98 % (10/30 0742)    Intake/Output from previous day: 10/29 0701 - 10/30 0700 In: 2247.7 [I.V.:2167.7; NG/GT:30; IV Piggyback:50] Out: 1950 [Urine:1450; Emesis/NG output:500] Intake/Output this shift:    General appearance: Alert.  Very pleasant.  No distress.  Mental status normal. Resp: clear to auscultation bilaterally GI: Soft.  Not distended.  Bowel sounds present.  Wound clean.  Appropriate incisional tenderness.  Lab Results:  Results for orders placed or performed during the hospital encounter of 07/07/15 (from the past 24 hour(s))  CBC     Status: Abnormal   Collection Time: 07/09/15  5:36 AM  Result Value Ref Range   WBC 7.2 4.0 - 10.5 K/uL   RBC 3.96 3.87 - 5.11 MIL/uL   Hemoglobin 11.0 (L) 12.0 - 15.0 g/dL   HCT 32.8 (L) 36.0 - 46.0 %   MCV 82.8 78.0 - 100.0 fL   MCH 27.8 26.0 - 34.0 pg   MCHC 33.5 30.0 - 36.0 g/dL   RDW 14.3 11.5 - 15.5 %   Platelets 162 150 - 400 K/uL  Basic metabolic panel     Status: Abnormal   Collection Time: 07/09/15  5:36 AM  Result Value Ref Range   Sodium 138 135 - 145 mmol/L   Potassium 3.5 3.5 - 5.1 mmol/L   Chloride 101 101 - 111 mmol/L   CO2 24 22 - 32 mmol/L   Glucose, Bld 94 65 - 99 mg/dL   BUN 8 6 - 20 mg/dL   Creatinine, Ser 1.05 (H) 0.44 - 1.00 mg/dL   Calcium 8.9 8.9 - 10.3 mg/dL   GFR calc non Af Amer 54 (L) >60 mL/min   GFR calc Af Amer >60 >60 mL/min   Anion gap 13 5 - 15     Studies/Results: No results found.  Marland Kitchen antiseptic oral rinse  7 mL Mouth  Rinse q12n4p  . chlorhexidine  15 mL Mouth Rinse BID  . enoxaparin (LOVENOX) injection  30 mg Subcutaneous Q24H  . pantoprazole (PROTONIX) IV  40 mg Intravenous QHS     Assessment/Plan: s/p Procedure(s): EXPLORATORY LAPAROTOMY WITH ILEOCECECTOMY   Strangulated closed-loop SBO POD #2. Ileocecectomy. Stable DC NG tube Clear liquid diet Reduce narcotic Replace potassium Lovenox for DVT prophylaxis.  @PROBHOSP @  LOS: 2 days    Anne [name] M 07/09/2015  . .prob

## 2015-07-10 ENCOUNTER — Encounter (HOSPITAL_COMMUNITY): Payer: Self-pay | Admitting: Surgery

## 2015-07-10 LAB — BASIC METABOLIC PANEL
Anion gap: 6 (ref 5–15)
BUN: 8 mg/dL (ref 6–20)
CHLORIDE: 109 mmol/L (ref 101–111)
CO2: 25 mmol/L (ref 22–32)
Calcium: 8.8 mg/dL — ABNORMAL LOW (ref 8.9–10.3)
Creatinine, Ser: 0.84 mg/dL (ref 0.44–1.00)
GFR calc non Af Amer: >60 mL/min (ref >60)
Glucose, Bld: 84 mg/dL (ref 65–99)
POTASSIUM: 3.8 mmol/L (ref 3.5–5.1)
SODIUM: 140 mmol/L (ref 135–145)

## 2015-07-10 MED ORDER — AMILORIDE HCL 5 MG PO TABS
5.0000 mg | ORAL_TABLET | Freq: Every day | ORAL | Status: DC
  Administered 2015-07-10 – 2015-07-14 (×5): 5 mg via ORAL
  Filled 2015-07-10 (×5): qty 1

## 2015-07-10 MED ORDER — VITAMIN D 1000 UNITS PO TABS
1000.0000 [IU] | ORAL_TABLET | Freq: Every day | ORAL | Status: DC
  Administered 2015-07-10 – 2015-07-14 (×5): 1000 [IU] via ORAL
  Filled 2015-07-10 (×5): qty 1

## 2015-07-10 MED ORDER — CALCIUM 500 MG PO TABS: 600.0000 mg | ORAL_TABLET | Freq: Every day | ORAL | Status: DC

## 2015-07-10 MED ORDER — PANTOPRAZOLE SODIUM 40 MG PO TBEC
40.0000 mg | DELAYED_RELEASE_TABLET | Freq: Every day | ORAL | Status: DC
  Administered 2015-07-10 – 2015-07-13 (×4): 40 mg via ORAL
  Filled 2015-07-10 (×4): qty 1

## 2015-07-10 MED ORDER — MORPHINE SULFATE (PF) 2 MG/ML IV SOLN: 1.0000 mg | INTRAVENOUS | Status: DC | PRN

## 2015-07-10 MED ORDER — CALCIUM CARBONATE ANTACID 500 MG PO CHEW
1.0000 | CHEWABLE_TABLET | Freq: Every day | ORAL | Status: DC
  Administered 2015-07-10 – 2015-07-14 (×5): 200 mg via ORAL
  Filled 2015-07-10 (×5): qty 1

## 2015-07-10 MED ORDER — BISACODYL 10 MG RE SUPP: 10.0000 mg | Freq: Every day | RECTAL | Status: DC | PRN

## 2015-07-10 NOTE — Progress Notes (Signed)
[facility] Progress Note  3 Days Post-Op  Subjective: Doing well, no N/V.  Pain well controlled.  Having good flatus, but no BM yet.  Ambulating well.  Walking the room this am when I came in.  Hungry, tolerating clears.  Wants something more.    Objective: Vital signs in last 24 hours: Temp:  [98.4 F (36.9 C)-99.9 F (37.7 C)] 98.4 F (36.9 C) (10/31 0559) Pulse Rate:  [80-90] 80 (10/31 0559) Resp:  [18-20] 18 (10/31 0559) BP: (153-159)/(70-86) 153/70 mmHg (10/31 0559) SpO2:  [99 %] 99 % (10/31 0559)    Intake/Output from previous day: 10/30 0701 - 10/31 0700 In: 3000 [P.O.:400; I.V.:2400; IV Piggyback:200] Out: 2900 [Urine:2600; Emesis/NG output:300] Intake/Output this shift:    PE: Gen:  Alert, NAD, pleasant Abd: Soft, mild tenderness, ND, few BS, no HSM, incisions C/D/I, honeycomb with minimal dried bloody drainage only, staples in place   Lab Results:   Recent Labs  07/08/15 0607 07/09/15 0536  WBC 8.0 7.2  HGB 11.4* 11.0*  HCT 32.7* 32.8*  PLT 164 162   BMET  Recent Labs  07/09/15 0536 07/10/15 0458  NA 138 140  K 3.5 3.8  CL 101 109  CO2 24 25  GLUCOSE 94 84  BUN 8 8  CREATININE 1.05* 0.84  CALCIUM 8.9 8.8*   PT/INR No results for input(s): LABPROT, INR in the last 72 hours. CMP     Component Value Date/Time   NA 140 07/10/2015 0458   K 3.8 07/10/2015 0458   CL 109 07/10/2015 0458   CO2 25 07/10/2015 0458   GLUCOSE 84 07/10/2015 0458   BUN 8 07/10/2015 0458   CREATININE 0.84 07/10/2015 0458   CREATININE 1.34* 03/19/2010 0936   CALCIUM 8.8* 07/10/2015 0458   PROT 6.5 07/07/2015 0615   ALBUMIN 3.7 07/07/2015 0615   AST 22 07/07/2015 0615   ALT 13* 07/07/2015 0615   ALKPHOS 58 07/07/2015 0615   BILITOT 0.6 07/07/2015 0615   GFRNONAA >60 07/10/2015 0458   GFRAA >60 07/10/2015 0458   Lipase     Component Value Date/Time   LIPASE 30 07/07/2015 0615       Studies/Results: No results  found.  Anti-infectives: Anti-infectives    Start     Dose/Rate Route Frequency Ordered Stop   07/07/15 1900  cefOXitin (MEFOXIN) 2 g in dextrose 5 % 50 mL IVPB  Status:  Discontinued     2 g 100 mL/hr over 30 Minutes Intravenous 3 times per day 07/07/15 1556 07/09/15 1006   07/07/15 1032  dextrose 5 % with cefOXitin (MEFOXIN) ADS Med    Comments:  Elige Ko   : cabinet override      07/07/15 1032 07/07/15 1811   07/07/15 0915  cefOXitin (MEFOXIN) 2 g in dextrose 5 % 50 mL IVPB  Status:  Discontinued     2 g 100 mL/hr over 30 Minutes Intravenous 3 times per day 07/07/15 0906 07/07/15 1556       Assessment/Plan Strangulated closed-loop SBO POD #3. Ex Lap with Ileocecectomy. -NG out, Advance to fulls -Reduce narcotic -K+ better -Lovenox for DVT prophylaxis. -Resume home meds -Remove honeycomb today, remove staples day 10  HTN -BP up, resume home med   LOS: 3 days    Nat Christen 07/10/2015, 8:50 AM Pager: [phone]

## 2015-07-10 NOTE — Care Management Important Message (Signed)
Important Message  Patient Details  Name: Anne [name] MRN: [id number] Date of Birth: 1946-04-02   Medicare Important Message Given:  Yes-second notification given    Nathen May 07/10/2015, 5:47 PM

## 2015-07-11 MED ORDER — MORPHINE SULFATE (PF) 2 MG/ML IV SOLN: 1.0000 mg | Freq: Four times a day (QID) | INTRAVENOUS | Status: DC | PRN

## 2015-07-11 NOTE — Progress Notes (Signed)
[facility] Progress Note  4 Days Post-Op  Subjective: Ambulating well.  No N/V, pain well controlled with minimal orals.  Lots of flatus, but no BM yet.  Thinks she overdid it yesterday and that's why she's more sore.  Coughing/laughing hurts.    Objective: Vital signs in last 24 hours: Temp:  [98.2 F (36.8 C)-99 F (37.2 C)] 98.2 F (36.8 C) (11/01 0516) Pulse Rate:  [59-74] 59 (11/01 0516) Resp:  [18] 18 (11/01 0516) BP: (127-133)/(61-64) 133/61 mmHg (11/01 0516) SpO2:  [98 %-99 %] 98 % (11/01 0516) Last BM Date: 07/05/15  Intake/Output from previous day: 10/31 0701 - 11/01 0700 In: -  Out: 1400 [Urine:1400] Intake/Output this shift:    PE: Gen:  Alert, NAD, pleasant Abd: Soft, tender, ND, +BS, no HSM, incisions C/D/I with staples in place   Lab Results:   Recent Labs  07/09/15 0536  WBC 7.2  HGB 11.0*  HCT 32.8*  PLT 162   BMET  Recent Labs  07/09/15 0536 07/10/15 0458  NA 138 140  K 3.5 3.8  CL 101 109  CO2 24 25  GLUCOSE 94 84  BUN 8 8  CREATININE 1.05* 0.84  CALCIUM 8.9 8.8*   PT/INR No results for input(s): LABPROT, INR in the last 72 hours. CMP     Component Value Date/Time   NA 140 07/10/2015 0458   K 3.8 07/10/2015 0458   CL 109 07/10/2015 0458   CO2 25 07/10/2015 0458   GLUCOSE 84 07/10/2015 0458   BUN 8 07/10/2015 0458   CREATININE 0.84 07/10/2015 0458   CREATININE 1.34* 03/19/2010 0936   CALCIUM 8.8* 07/10/2015 0458   PROT 6.5 07/07/2015 0615   ALBUMIN 3.7 07/07/2015 0615   AST 22 07/07/2015 0615   ALT 13* 07/07/2015 0615   ALKPHOS 58 07/07/2015 0615   BILITOT 0.6 07/07/2015 0615   GFRNONAA >60 07/10/2015 0458   GFRAA >60 07/10/2015 0458   Lipase     Component Value Date/Time   LIPASE 30 07/07/2015 0615       Studies/Results: No results found.  Anti-infectives: Anti-infectives    Start     Dose/Rate Route Frequency Ordered Stop   07/07/15 1900  cefOXitin (MEFOXIN) 2 g in dextrose 5 % 50 mL IVPB   Status:  Discontinued     2 g 100 mL/hr over 30 Minutes Intravenous 3 times per day 07/07/15 1556 07/09/15 1006   07/07/15 1032  dextrose 5 % with cefOXitin (MEFOXIN) ADS Med    Comments:  Elige Ko   : cabinet override      07/07/15 1032 07/07/15 1811   07/07/15 0915  cefOXitin (MEFOXIN) 2 g in dextrose 5 % 50 mL IVPB  Status:  Discontinued     2 g 100 mL/hr over 30 Minutes Intravenous 3 times per day 07/07/15 0906 07/07/15 1556       Assessment/Plan Strangulated closed-loop SBO POD #4. Ex Lap with Ileocecectomy. -NG out, Cont fulls, await BM -Using minimal narcotics -Ambulate and IS - IS to 1500 -Lovenox for DVT prophylaxis. -Resume home meds -Remove staples day 10    LOS: 4 days    Nat Christen 07/11/2015, 8:14 AM Pager: [phone]

## 2015-07-11 NOTE — Care Management Note (Signed)
Case Management Note  Patient Details  Name: Anne [name] MRN: [id number] Date of Birth: 1946-07-08  Subjective/Objective:                    Action/Plan:  UR updated Expected Discharge Date:                  Expected Discharge Plan:  Home/Self Care  In-House Referral:     Discharge planning Services     Post Acute Care Choice:    Choice offered to:     DME Arranged:    DME Agency:     HH Arranged:    [place] Agency:     Status of Service:  In process, will continue to follow  Medicare Important Message Given:  Yes-second notification given Date Medicare IM Given:    Medicare IM give by:    Date Additional Medicare IM Given:    Additional Medicare Important Message give by:     If discussed at [place] of Stay Meetings, dates discussed:    Additional Comments:  Marilu Favre, RN 07/11/2015, 2:13 PM

## 2015-07-12 LAB — CBC
HCT: 30.6 % — ABNORMAL LOW (ref 36.0–46.0)
HEMOGLOBIN: 10.5 g/dL — AB (ref 12.0–15.0)
MCH: 28.3 pg (ref 26.0–34.0)
MCHC: 34.3 g/dL (ref 30.0–36.0)
MCV: 82.5 fL (ref 78.0–100.0)
Platelets: 166 10*3/uL (ref 150–400)
RBC: 3.71 MIL/uL — AB (ref 3.87–5.11)
RDW: 14.2 % (ref 11.5–15.5)
WBC: 4.9 10*3/uL (ref 4.0–10.5)

## 2015-07-12 LAB — BASIC METABOLIC PANEL
Anion gap: 7 (ref 5–15)
BUN: 5 mg/dL — ABNORMAL LOW (ref 6–20)
CHLORIDE: 103 mmol/L (ref 101–111)
CO2: 25 mmol/L (ref 22–32)
CREATININE: 0.91 mg/dL (ref 0.44–1.00)
Calcium: 9.2 mg/dL (ref 8.9–10.3)
GFR calc non Af Amer: >60 mL/min (ref >60)
Glucose, Bld: 105 mg/dL — ABNORMAL HIGH (ref 65–99)
POTASSIUM: 3.6 mmol/L (ref 3.5–5.1)
Sodium: 135 mmol/L (ref 135–145)

## 2015-07-12 LAB — URINALYSIS, ROUTINE W REFLEX MICROSCOPIC
Bilirubin Urine: NEGATIVE
Glucose, UA: NEGATIVE mg/dL
Ketones, ur: NEGATIVE mg/dL
LEUKOCYTES UA: NEGATIVE
Nitrite: NEGATIVE
PROTEIN: NEGATIVE mg/dL
SPECIFIC GRAVITY, URINE: 1.009 (ref 1.005–1.030)
UROBILINOGEN UA: 0.2 mg/dL (ref 0.0–1.0)
pH: 6.5 (ref 5.0–8.0)

## 2015-07-12 LAB — URINE MICROSCOPIC-ADD ON

## 2015-07-12 MED ORDER — DOCUSATE SODIUM 100 MG PO CAPS
100.0000 mg | ORAL_CAPSULE | Freq: Two times a day (BID) | ORAL | Status: DC
  Administered 2015-07-12 – 2015-07-14 (×5): 100 mg via ORAL
  Filled 2015-07-12 (×5): qty 1

## 2015-07-12 MED ORDER — BISACODYL 10 MG RE SUPP
10.0000 mg | Freq: Every day | RECTAL | Status: DC
  Administered 2015-07-12 – 2015-07-14 (×3): 10 mg via RECTAL
  Filled 2015-07-12 (×3): qty 1

## 2015-07-12 NOTE — Progress Notes (Signed)
Anne [name] Progress Note  5 Days Post-Op  Subjective: Doing okay, but c/o frequent urination with discomfort.  Having accidents.  Ambulating well.  No N/V, tolerating fulls well.  Pain is well managed.  Lots of flatus, but still no BM.  Objective: Vital signs in last 24 hours: Temp:  [99 F (37.2 C)-99.2 F (37.3 C)] 99.2 F (37.3 C) (11/02 0626) Pulse Rate:  [65-81] 65 (11/02 0626) Resp:  [18-20] 18 (11/02 0626) BP: (126-146)/(63-70) 146/70 mmHg (11/02 0626) SpO2:  [98 %-100 %] 99 % (11/02 0626) Last BM Date: 07/05/15  Intake/Output from previous day: 11/01 0701 - 11/02 0700 In: 2420 [P.O.:720; I.V.:1700] Out: 2150 [Urine:2150] Intake/Output this shift:    PE: Gen:  Alert, NAD, pleasant Abd: Soft, minimal tenderness, ND, Great BS, no HSM, incisions C/D/I, no drainage, minimal erythema around umbilicus   Lab Results:  No results for input(s): WBC, HGB, HCT, PLT in the last 72 hours. BMET  Recent Labs  07/10/15 0458  NA 140  K 3.8  CL 109  CO2 25  GLUCOSE 84  BUN 8  CREATININE 0.84  CALCIUM 8.8*   PT/INR No results for input(s): LABPROT, INR in the last 72 hours. CMP     Component Value Date/Time   NA 140 07/10/2015 0458   K 3.8 07/10/2015 0458   CL 109 07/10/2015 0458   CO2 25 07/10/2015 0458   GLUCOSE 84 07/10/2015 0458   BUN 8 07/10/2015 0458   CREATININE 0.84 07/10/2015 0458   CREATININE 1.34* 03/19/2010 0936   CALCIUM 8.8* 07/10/2015 0458   PROT 6.5 07/07/2015 0615   ALBUMIN 3.7 07/07/2015 0615   AST 22 07/07/2015 0615   ALT 13* 07/07/2015 0615   ALKPHOS 58 07/07/2015 0615   BILITOT 0.6 07/07/2015 0615   GFRNONAA >60 07/10/2015 0458   GFRAA >60 07/10/2015 0458   Lipase     Component Value Date/Time   LIPASE 30 07/07/2015 0615       Studies/Results: No results found.  Anti-infectives: Anti-infectives    Start     Dose/Rate Route Frequency Ordered Stop   07/07/15 1900  cefOXitin (MEFOXIN) 2 g in dextrose 5 % 50 mL  IVPB  Status:  Discontinued     2 g 100 mL/hr over 30 Minutes Intravenous 3 times per day 07/07/15 1556 07/09/15 1006   07/07/15 1032  dextrose 5 % with cefOXitin (MEFOXIN) ADS Med    Comments:  Anne [name]   : cabinet override      07/07/15 1032 07/07/15 1811   07/07/15 0915  cefOXitin (MEFOXIN) 2 g in dextrose 5 % 50 mL IVPB  Status:  Discontinued     2 g 100 mL/hr over 30 Minutes Intravenous 3 times per day 07/07/15 0906 07/07/15 1556       Assessment/Plan Strangulated closed-loop SBO POD #5. Ex Lap with Ileocecectomy. -NG out, Cont fulls, await BM -D/c IV narcotics -Ambulate and IS -Lovenox for DVT prophylaxis. -On home meds -Remove staples POD #10 -Add colace and dulcolax daily    LOS: 5 days    Anne [name] 07/12/2015, 7:28 AM Pager: [phone]

## 2015-07-13 ENCOUNTER — Inpatient Hospital Stay (HOSPITAL_COMMUNITY): Payer: PPO

## 2015-07-13 MED ORDER — POLYETHYLENE GLYCOL 3350 17 GM/SCOOP PO POWD: 8.5000 g | Freq: Every day | ORAL | Status: DC | PRN

## 2015-07-13 MED ORDER — DOCUSATE SODIUM 100 MG PO CAPS: 100.0000 mg | ORAL_CAPSULE | Freq: Two times a day (BID) | ORAL | Status: DC | PRN

## 2015-07-13 MED ORDER — BISACODYL 10 MG RE SUPP: 10.0000 mg | Freq: Every day | RECTAL | Status: DC | PRN

## 2015-07-13 MED ORDER — HYDROCODONE-ACETAMINOPHEN 5-325 MG PO TABS: 1.0000 | ORAL_TABLET | Freq: Four times a day (QID) | ORAL | Status: DC | PRN

## 2015-07-13 NOTE — Discharge Summary (Signed)
[facility] Discharge Summary   Patient ID: Anne [name] MRN: [id number] DOB/AGE: [birth year] 69 y.o.  Admit date: 07/07/2015 Discharge date: 07/13/2015  Admitting Diagnosis: Small bowel obstruction  Discharge Diagnosis Patient Active Problem List   Diagnosis Date Noted  . Small bowel obstruction ([place]) 07/07/2015  . SBO (small bowel obstruction) ([place]) 07/07/2015  . 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 Back pain 10/21/2014  . Routine general medical examination at a health care facility 07/12/2014  . Hearing loss 09/27/2012  . Other screening mammogram 09/27/2012  . Pure hypercholesterolemia 09/25/2012  . HTN (hypertension) 10/01/2011    Consultants None  Imaging: No results found.  Procedures Dr. Georgette Dover (07/07/15) - Exploratory laparotomy with ileocecectomy   Hospital Course:  69 y/o AA female who has a h/o HTN who acutely woke up at 0400am with severe RLQ abdominal pain. She states this was sharp and stabbing. Worse than labor pain or any other pain she's ever had. She hasn't had flatus this morning. Had a BM last night. Nothing makes her pain better except narcotics here in the ED. She admits to nausea and vomiting, but no blood seen in her stool or emesis. Her son notes she has had some recent weight loss. She did have blood in her urine last week and she saw her PCP for this, but it has since resolved. Upon arrival to the ED, she had a CT scan that questions a closed loop small bowel obstruction.   Patient was admitted and underwent procedure listed above.  Tolerated procedure well and was transferred to the floor.  She was infact found to have a closed loop obstruction with infarcted areas of small bowel which required an ileocecectomy.  She experienced an expected post-operative ileus.  Her NG was discontinued on POD #2 and started on clears.  Diet was advanced as tolerated.  She c/o urinary discomfort, but luckily her UA was normal.   Potassium was supplemented as needed.  She had concerns about her surgical scar.  We instructed on starting scar cream, Vitamin E, Eucerin, or Vaseline when scar is fully healed to help minimize scar.  On POD #6, the patient was voiding well, tolerating diet, ambulating well, pain well controlled, vital signs stable, incisions c/d/i staples in place and felt stable for discharge home.  She then had one episode of emesis just prior to leaving.  She remained inpatient for one more day.  She had plain films done which were unremarkable.  She tolerated her supper and her breakfast on POD 7 and was then stable for DC home.  Patient will follow up in our office next week for staple removal and then in 2-3 weeks and knows to call with questions or concerns.  She will call to confirm appointment dates/times.  She is recommended to stay on a soft diet for 1-2 weeks and encouraged to take a bowel regimen.  Physical Exam: General:  Alert, NAD, pleasant, comfortable Heart: regular, rate, and rhythm.  Normal s1,s2. No obvious murmurs, gallops, or rubs noted.   Lungs: CTAB, no wheezes, rhonchi, or rales noted.  Respiratory effort nonlabored Abd:  Soft, ND, mild tenderness, incisions C/D/I with staples in place Psych: A&Ox3 with an appropriate affect.    Medication List    TAKE these medications        aMILoride 5 MG tablet  Commonly known as:  MIDAMOR  Take 5 mg by mouth daily.     bisacodyl 10 MG suppository  Commonly known as:  DULCOLAX  Place 1 suppository (10 mg total) rectally daily as needed for severe constipation.     Calcium 500 MG tablet  Take 600 mg by mouth daily.     cholecalciferol 1000 UNITS tablet  Commonly known as:  VITAMIN D  Take 1,000 Units by mouth daily.     docusate sodium 100 MG capsule  Commonly known as:  COLACE  Take 1 capsule (100 mg total) by mouth 2 (two) times daily as needed for mild constipation or moderate constipation.     GARLIC PO  Take 1 tablet by mouth  daily.     HYDROcodone-acetaminophen 5-325 MG tablet  Commonly known as:  NORCO/VICODIN  Take 1-2 tablets by mouth every 6 (six) hours as needed for moderate pain.     magnesium gluconate 500 MG tablet  Commonly known as:  MAGONATE  Take 500 mg by mouth daily.     polyethylene glycol powder powder  Commonly known as:  MIRALAX  Take 8.5-34 g by mouth daily as needed. To correct constipation.  Adjust dose over 1-2 months.  Goal = ~1 bowel movement / day         Follow-up Information    Go to [facility]   Specialty:  General Surgery   Why:  appointment for staple removal to your abdomen   Contact information:   [address] [place] [phone]      Go to Maia Petties., MD.   Specialty:  General Surgery   Why:  For post-operation check.  Call to confirm appointment dates and times.   Contact information:   [place] [phone] [phone]       Signed: Nat Christen, [facility] [phone]  07/13/2015, 9:50 AM

## 2015-07-13 NOTE — Discharge Instructions (Signed)
[facility]      [facility] °[phone] ° °OPEN ABDOMINAL SURGERY: POST OP INSTRUCTIONS ° °Always review your discharge instruction sheet given to you by the facility where your surgery was performed. ° °IF YOU HAVE DISABILITY OR FAMILY LEAVE FORMS, YOU MUST BRING THEM TO THE OFFICE FOR PROCESSING.  PLEASE DO NOT GIVE THEM TO YOUR DOCTOR. ° °1. A prescription for pain medication may be given to you upon discharge.  Take your pain medication as prescribed, if needed.  If narcotic pain medicine is not needed, then you may take acetaminophen (Tylenol) or ibuprofen (Advil) as needed. °2. Take your usually prescribed medications unless otherwise directed. °3. If you need a refill on your pain medication, please contact your pharmacy. They will contact our office to request authorization.  Prescriptions will not be filled after 5pm or on week-ends. °4. You should follow a light diet the first few days after arrival home, such as soup and crackers, pudding, etc.unless your doctor has advised otherwise. A high-fiber, low fat diet can be resumed as tolerated.   Be sure to include lots of fluids daily. Most patients will experience some swelling and bruising on the chest and neck area.  Ice packs will help.  Swelling and bruising can take several days to resolve °5. Most patients will experience some swelling and bruising in the area of the incision. Ice pack will help. Swelling and bruising can take several days to resolve..  °6. It is common to experience some constipation if taking pain medication after surgery.  Increasing fluid intake and taking a stool softener will usually help or prevent this problem from occurring.  A mild laxative (Milk of Magnesia or Miralax) should be taken according to package directions if there are no bowel movements after 48 hours. °7.  You may have steri-strips (small skin tapes) in place directly over the incision.  These strips should be left on the skin for 7-10 days.  If your  surgeon used skin glue on the incision, you may shower in 24 hours.  The glue will flake off over the next 2-3 weeks.  Any sutures or staples will be removed at the office during your follow-up visit. You may find that a light gauze bandage over your incision may keep your staples from being rubbed or pulled. You may shower and replace the bandage daily. °8. ACTIVITIES:  You may resume regular (light) daily activities beginning the next day--such as daily self-care, walking, climbing stairs--gradually increasing activities as tolerated.  You may have sexual intercourse when it is comfortable.  Refrain from any heavy lifting or straining until approved by your doctor. °a. You may drive when you no longer are taking prescription pain medication, you can comfortably wear a seatbelt, and you can safely maneuver your car and apply brakes °b. Return to Work: ___________________________________ °9. You should see your doctor in the office for a follow-up appointment approximately two weeks after your surgery.  Make sure that you call for this appointment within a day or two after you arrive home to insure a convenient appointment time. °OTHER INSTRUCTIONS:  °_____________________________________________________________ °_____________________________________________________________ ° °WHEN TO CALL YOUR DOCTOR: °1. Fever over 101.0 °2. Inability to urinate °3. Nausea and/or vomiting °4. Extreme swelling or bruising °5. Continued bleeding from incision. °6. Increased pain, redness, or drainage from the incision. °7. Difficulty swallowing or breathing °8. Muscle cramping or spasms. °9. Numbness or tingling in hands or feet or around lips. ° °The clinic staff is available to   answer your questions during regular business hours.  Please dont hesitate to call and ask to speak to one of the nurses if you have concerns.  For further questions, please visit [email or web address]   Preventing Constipation After  Surgery Constipation is when a person has fewer than 3 bowel movements a week; has difficulty having a bowel movement; or has stools that are dry, hard, or larger than normal. Many things can make constipation likely after surgery. They include:  Medicines, especially numbing medicines (anesthetics) and very strong pain medicines called narcotics.  Feeling stressed because of the surgery.  Eating different foods than normal.  Being less active. Symptoms of constipation include:  Having fewer than 3 bowel movements a week.  Straining to have a bowel movement.  Having hard, dry, or larger-than-normal stools.  Feeling full or bloated.  Having pain in the lower abdomen.  Not feeling relief after having a bowel movement. HOME CARE INSTRUCTIONS  Diet  Eat foods that have a lot of fiber. These include fruits, vegetables, whole grains, and beans. Limit foods high in fat and processed sugars. These include french fries, hamburgers, cookies, and candy.  Take a fiber supplement as directed. If you are not taking a fiber supplement and think that you are not getting enough fiber from foods, talk to your health care provider about adding a fiber supplement to your diet.  Drink clear fluids, especially water. Avoid drinking alcohol, caffeine, and soda. These can make constipation worse.  Drink enough fluids to keep your urine clear or pale yellow. Activity   After surgery, return to your normal activities slowly or when your health care provider says it is okay.  Start walking as soon as you can. Try to go a little farther each day.  Once your health care provider approves, do some sort of regular exercise. This helps prevent constipation. Bowel Movements  Go to the restroom when you have the urge to go. Do not hold it in.  Try drinking something hot to get a bowel movement started.  Keep track of how often you use the restroom. If you miss 2-3 bowel movements, talk to your health  care provider about medicines that prevent constipation. Your health care provider may suggest a stool softener, laxative, or fiber supplement.  Only take over-the-counter or prescription medicines as directed by your health care provider.  Do not take other medicines without talking to your health care provider first. If you become constipated and take a medicine to make you have a bowel movement, the problem may get worse. Other kinds of medicine can also make the problem worse. SEEK MEDICAL CARE IF:  You used stool softeners or laxatives and still have not had a bowel movement within 24-48 hours after using them.  You have not had a bowel movement in 3 days. SEEK IMMEDIATE MEDICAL CARE IF:   Your constipation lasts for more than 4 days or gets worse.  You have bright red blood in your stool.  You have abdominal or rectal pain.  You have very bad cramping.  You have thin, pencil-like stools.  You have unexplained weight loss.  You have a fever or persistent symptoms for more than 2-3 days.  You have a fever and your symptoms suddenly get worse.   This information is not intended to replace advice given to you by your health care provider. Make sure you discuss any questions you have with your health care provider.   Document Released: 12/21/2012 Document Revised: 09/16/2014 Document  Reviewed: 12/21/2012 Elsevier Interactive Patient Education 2016 Standard Meal Plan A soft-food meal plan includes foods that are safe and easy to swallow. This meal plan typically is used:  If you are having trouble chewing or swallowing foods.  As a transition meal plan after only having had liquid meals for a long period. WHAT DO I NEED TO KNOW ABOUT THE SOFT-FOOD MEAL PLAN? A soft-food meal plan includes tender foods that are soft and easy to chew and swallow. In most cases, bite-sized pieces of food are easier to swallow. A bite-sized piece is about  inch or smaller. Foods  in this plan do not need to be ground or pureed. Foods that are very hard, crunchy, or sticky should be avoided. Also, breads, cereals, yogurts, and desserts with nuts, seeds, or fruits should be avoided. WHAT FOODS CAN I EAT? Grains Rice and wild rice. Moist bread, dressing, pasta, and noodles. Well-moistened dry or cooked cereals, such as farina (cooked wheat cereal), oatmeal, or grits. Biscuits, breads, muffins, pancakes, and waffles that have been well moistened. Vegetables Shredded lettuce. Cooked, tender vegetables, including potatoes without skins. Vegetable juices. Broths or creamed soups made with vegetables that are not stringy or chewy. Strained tomatoes (without seeds). Fruits Canned or well-cooked fruits. Soft (ripe), peeled fresh fruits, such as peaches, nectarines, kiwi, cantaloupe, honeydew melon, and watermelon (without seeds). Soft berries with small seeds, such as strawberries. Fruit juices (without pulp). Meats and Other Protein Sources Moist, tender, lean beef. Mutton. Lamb. Veal. Chicken. [place]. Liver. Ham. Fish without bones. Eggs. Dairy Milk, milk drinks, and cream. Plain cream cheese and cottage cheese. Plain yogurt. Sweets/Desserts Flavored gelatin desserts. Custard. Plain ice cream, frozen yogurt, sherbet, milk shakes, and malts. Plain cakes and cookies. Plain hard candy.  Other Butter, margarine (without trans fat), and cooking oils. Mayonnaise. Cream sauces. Mild spices, salt, and sugar. Syrup, molasses, honey, and jelly. The items listed above may not be a complete list of recommended foods or beverages. Contact your dietitian for more options. WHAT FOODS ARE NOT RECOMMENDED? Grains Dry bread, toast, crackers that have not been moistened. Coarse or dry cereals, such as bran, granola, and shredded wheat. Tough or chewy crusty breads, such as [place] bread or baguettes. Vegetables Corn. Raw vegetables except shredded lettuce. Cooked vegetables that are tough or  stringy. Tough, crisp, fried potatoes and potato skins. Fruits Fresh fruits with skins or seeds or both, such as apples, pears, or grapes. Stringy, high-pulp fruits, such as papaya, pineapple, coconut, or mango. Fruit leather, fruit roll-ups, and all dried fruits. Meats and Other Protein Sources Sausages and hot dogs. Meats with gristle. Fish with bones. Nuts, seeds, and chunky peanut or other nut butters. Sweets/Desserts Cakes or cookies that are very dry or chewy.  The items listed above may not be a complete list of foods and beverages to avoid. Contact your dietitian for more information.   This information is not intended to replace advice given to you by your health care provider. Make sure you discuss any questions you have with your health care provider.   Document Released: 12/03/2007 Document Revised: 08/31/2013 Document Reviewed: 07/23/2013 Elsevier Interactive Patient Education [employer].

## 2015-07-13 NOTE — Progress Notes (Signed)
6 Days Post-Op  Subjective: PA had seen pt this am and was doing well. Tolerating diet, no n/v, +flatus and had discussed dc pt with me. I rounded this pm and pt has had an episode of n/v after lunch. +flatus. +BM p suppository  Objective: Vital signs in last 24 hours: Temp:  [98.7 F (37.1 C)-99.4 F (37.4 C)] 98.7 F (37.1 C) (11/03 1433) Pulse Rate:  [65-76] 76 (11/03 1433) Resp:  [17-18] 18 (11/03 1433) BP: (123-149)/(66-88) 139/88 mmHg (11/03 1433) SpO2:  [99 %-100 %] 100 % (11/03 1433) Last BM Date: 07/12/15  Intake/Output from previous day: 11/02 0701 - 11/03 0700 In: 780 [P.O.:780] Out: 1450 [Urine:1450] Intake/Output this shift:    Alert, nontoxic cta  Soft, not really distended, some TTP; +BS, dressing c/d/i No edema  Lab Results:   Recent Labs  07/12/15 0752  WBC 4.9  HGB 10.5*  HCT 30.6*  PLT 166   BMET  Recent Labs  07/12/15 0752  NA 135  K 3.6  CL 103  CO2 25  GLUCOSE 105*  BUN 5*  CREATININE 0.91  CALCIUM 9.2   PT/INR No results for input(s): LABPROT, INR in the last 72 hours. ABG No results for input(s): PHART, HCO3 in the last 72 hours.  Invalid input(s): PCO2, PO2  Studies/Results: No results found.  Anti-infectives: Anti-infectives    Start     Dose/Rate Route Frequency Ordered Stop   07/07/15 1900  cefOXitin (MEFOXIN) 2 g in dextrose 5 % 50 mL IVPB  Status:  Discontinued     2 g 100 mL/hr over 30 Minutes Intravenous 3 times per day 07/07/15 1556 07/09/15 1006   07/07/15 1032  dextrose 5 % with cefOXitin (MEFOXIN) ADS Med    Comments:  Elige Ko   : cabinet override      07/07/15 1032 07/07/15 1811   07/07/15 0915  cefOXitin (MEFOXIN) 2 g in dextrose 5 % 50 mL IVPB  Status:  Discontinued     2 g 100 mL/hr over 30 Minutes Intravenous 3 times per day 07/07/15 0906 07/07/15 1556      Assessment/Plan: s/p Procedure(s): EXPLORATORY LAPAROTOMY WITH ILEOCECECTOMY  (N/A)  Hold discharge due to n/v Check abd plain  film. Will re-evaluate diet status after xray Check labs in am Ambulate  Leighton Ruff. Redmond Pulling, MD, FACS General, Bariatric, & Minimally Invasive Surgery [facility] [place]   LOS: 6 days    Anne [name] 07/13/2015

## 2015-07-14 LAB — BASIC METABOLIC PANEL
ANION GAP: 10 (ref 5–15)
BUN: 5 mg/dL — AB (ref 6–20)
CALCIUM: 8.9 mg/dL (ref 8.9–10.3)
CO2: 26 mmol/L (ref 22–32)
Chloride: 102 mmol/L (ref 101–111)
Creatinine, Ser: 0.91 mg/dL (ref 0.44–1.00)
GFR calc Af Amer: >60 mL/min (ref >60)
GLUCOSE: 91 mg/dL (ref 65–99)
POTASSIUM: 3.9 mmol/L (ref 3.5–5.1)
Sodium: 138 mmol/L (ref 135–145)

## 2015-07-14 LAB — CBC
HCT: 28.2 % — ABNORMAL LOW (ref 36.0–46.0)
HEMOGLOBIN: 9.7 g/dL — AB (ref 12.0–15.0)
MCH: 28.2 pg (ref 26.0–34.0)
MCHC: 34.4 g/dL (ref 30.0–36.0)
MCV: 82 fL (ref 78.0–100.0)
PLATELETS: 178 10*3/uL (ref 150–400)
RBC: 3.44 MIL/uL — ABNORMAL LOW (ref 3.87–5.11)
RDW: 14.1 % (ref 11.5–15.5)
WBC: 4.6 10*3/uL (ref 4.0–10.5)

## 2015-07-14 LAB — MAGNESIUM: MAGNESIUM: 1.8 mg/dL (ref 1.7–2.4)

## 2015-07-14 MED ORDER — POLYETHYLENE GLYCOL 3350 17 G PO PACK
17.0000 g | PACK | Freq: Every day | ORAL | Status: DC
  Filled 2015-07-14: qty 1

## 2015-07-14 MED ORDER — SULFAMETHOXAZOLE-TRIMETHOPRIM 800-160 MG PO TABS: 1.0000 | ORAL_TABLET | Freq: Two times a day (BID) | ORAL | Status: DC

## 2015-07-14 NOTE — Progress Notes (Signed)
7 Days Post-Op  Subjective: Tolerated yesterday's evening meal without abdominal pain, nausea or vomiting. Continues to have flatus. BM yesterday following suppository. Patient seems quite fixated on concerns of having daily BM at home "on her own" and "without suppository." has not eaten her morning meal yet, but states she plans to do so soon and then take a walk around until after meal. No issues with urination or ambulation. Films from 07/13/2015 without evidence of bowel obstruction.  Objective: Vital signs in last 24 hours: Temp:  [98.7 F (37.1 C)-99.1 F (37.3 C)] 98.8 F (37.1 C) (11/04 0810) Pulse Rate:  [70-76] 70 (11/04 0810) Resp:  [18-19] 19 (11/04 0544) BP: (118-141)/(63-88) 141/72 mmHg (11/04 0810) SpO2:  [98 %-100 %] 98 % (11/04 0810) Last BM Date: 07/12/15  Intake/Output from previous day: 11/03 0701 - 11/04 0700 In: 120 [P.O.:120] Out: -  Intake/Output this shift:    PE: Gen: NAD, lying in bed. Appears comfortable CV: RRR Pulm: CTA B/L Abd: midline incision C/D/I with staples. +Small area of tenderness, erythema and induration at left upper pole of incision. No bleeding or drainage from wound. Normoactive BS. No distension. Abd soft.  Lab Results:   Recent Labs  07/12/15 0752 07/14/15 0459  WBC 4.9 4.6  HGB 10.5* 9.7*  HCT 30.6* 28.2*  PLT 166 178   BMET  Recent Labs  07/12/15 0752 07/14/15 0459  NA 135 138  K 3.6 3.9  CL 103 102  CO2 25 26  GLUCOSE 105* 91  BUN 5* 5*  CREATININE 0.91 0.91  CALCIUM 9.2 8.9   PT/INR No results for input(s): LABPROT, INR in the last 72 hours. CMP     Component Value Date/Time   NA 138 07/14/2015 0459   K 3.9 07/14/2015 0459   CL 102 07/14/2015 0459   CO2 26 07/14/2015 0459   GLUCOSE 91 07/14/2015 0459   BUN 5* 07/14/2015 0459   CREATININE 0.91 07/14/2015 0459   CREATININE 1.34* 03/19/2010 0936   CALCIUM 8.9 07/14/2015 0459   PROT 6.5 07/07/2015 0615   ALBUMIN 3.7 07/07/2015 0615   AST 22  07/07/2015 0615   ALT 13* 07/07/2015 0615   ALKPHOS 58 07/07/2015 0615   BILITOT 0.6 07/07/2015 0615   GFRNONAA >60 07/14/2015 0459   GFRAA >60 07/14/2015 0459   Lipase     Component Value Date/Time   LIPASE 30 07/07/2015 0615       Studies/Results: Dg Abd Portable 2v  07/13/2015  CLINICAL DATA:  Vomiting, status post exploratory laparotomy with ileocecectomy on 06/29/2015 EXAM: PORTABLE ABDOMEN - 2 VIEW COMPARISON:  CT abdomen pelvis dated 06/29/2015 FINDINGS: Nonobstructive bowel gas pattern. No evidence of free air on the lateral decubitus view. Visualized osseous structures are within normal limits. IMPRESSION: No evidence of small bowel obstruction or free air. Electronically Signed   By: Julian Hy M.D.   On: 07/13/2015 16:05    Anti-infectives: Anti-infectives    Start     Dose/Rate Route Frequency Ordered Stop   07/07/15 1900  cefOXitin (MEFOXIN) 2 g in dextrose 5 % 50 mL IVPB  Status:  Discontinued     2 g 100 mL/hr over 30 Minutes Intravenous 3 times per day 07/07/15 1556 07/09/15 1006   07/07/15 1032  dextrose 5 % with cefOXitin (MEFOXIN) ADS Med    Comments:  Elige Ko   : cabinet override      07/07/15 1032 07/07/15 1811   07/07/15 0915  cefOXitin (MEFOXIN) 2 g in dextrose  5 % 50 mL IVPB  Status:  Discontinued     2 g 100 mL/hr over 30 Minutes Intravenous 3 times per day 07/07/15 0906 07/07/15 1556       Assessment/Plan  LOS: 7 days   Strangulated closed-loop SBO POD #7 Ex Lap with Ileocecectomy. -On regular diet with clinical evidence of return of bowel function and no evidence of obstruction on films from 07/13/2015. -Ambulate and IS -Lovenox for DVT prophylaxis. -On home meds -D/c IVs -Remove staples POD #10 -Add colace and dulcolax daily -will discuss small area of erythema/induration at left upper pole of surgical incision with Dr. Redmond Pulling -anticipate D/C to home today.  Dot Been [facility] 07/14/2015, 9:56  AM

## 2015-07-14 NOTE — Progress Notes (Signed)
IV removed. AVS given to patient, understanding verbalized.  Transportation arranged by patient.  Belongings packed.

## 2015-07-14 NOTE — Care Management Important Message (Signed)
Important Message  Patient Details  Name: Anne [name] MRN: [id number] Date of Birth: December 20, 1945   Medicare Important Message Given:  Yes-third notification given    Delorse Lek 07/14/2015, 1:32 PM

## 2015-09-13 DIAGNOSIS — M25511 Pain in right shoulder: Secondary | ICD-10-CM | POA: Diagnosis not present

## 2015-09-13 DIAGNOSIS — S161XXD Strain of muscle, fascia and tendon at neck level, subsequent encounter: Secondary | ICD-10-CM | POA: Diagnosis not present

## 2015-09-13 DIAGNOSIS — R531 Weakness: Secondary | ICD-10-CM | POA: Diagnosis not present

## 2015-09-13 DIAGNOSIS — M542 Cervicalgia: Secondary | ICD-10-CM | POA: Diagnosis not present

## 2015-09-14 DIAGNOSIS — S161XXD Strain of muscle, fascia and tendon at neck level, subsequent encounter: Secondary | ICD-10-CM | POA: Diagnosis not present

## 2015-09-14 DIAGNOSIS — R531 Weakness: Secondary | ICD-10-CM | POA: Diagnosis not present

## 2015-09-14 DIAGNOSIS — M542 Cervicalgia: Secondary | ICD-10-CM | POA: Diagnosis not present

## 2015-09-14 DIAGNOSIS — M25511 Pain in right shoulder: Secondary | ICD-10-CM | POA: Diagnosis not present

## 2015-09-18 DIAGNOSIS — M542 Cervicalgia: Secondary | ICD-10-CM | POA: Diagnosis not present

## 2015-09-18 DIAGNOSIS — S161XXD Strain of muscle, fascia and tendon at neck level, subsequent encounter: Secondary | ICD-10-CM | POA: Diagnosis not present

## 2015-09-18 DIAGNOSIS — R531 Weakness: Secondary | ICD-10-CM | POA: Diagnosis not present

## 2015-09-18 DIAGNOSIS — M25511 Pain in right shoulder: Secondary | ICD-10-CM | POA: Diagnosis not present

## 2015-09-20 DIAGNOSIS — M25511 Pain in right shoulder: Secondary | ICD-10-CM | POA: Diagnosis not present

## 2015-09-20 DIAGNOSIS — S161XXD Strain of muscle, fascia and tendon at neck level, subsequent encounter: Secondary | ICD-10-CM | POA: Diagnosis not present

## 2015-09-20 DIAGNOSIS — R531 Weakness: Secondary | ICD-10-CM | POA: Diagnosis not present

## 2015-09-20 DIAGNOSIS — M542 Cervicalgia: Secondary | ICD-10-CM | POA: Diagnosis not present

## 2015-09-21 DIAGNOSIS — S161XXD Strain of muscle, fascia and tendon at neck level, subsequent encounter: Secondary | ICD-10-CM | POA: Diagnosis not present

## 2015-09-21 DIAGNOSIS — M25511 Pain in right shoulder: Secondary | ICD-10-CM | POA: Diagnosis not present

## 2015-09-21 DIAGNOSIS — R531 Weakness: Secondary | ICD-10-CM | POA: Diagnosis not present

## 2015-09-21 DIAGNOSIS — M542 Cervicalgia: Secondary | ICD-10-CM | POA: Diagnosis not present

## 2015-09-26 DIAGNOSIS — S161XXD Strain of muscle, fascia and tendon at neck level, subsequent encounter: Secondary | ICD-10-CM | POA: Diagnosis not present

## 2015-09-26 DIAGNOSIS — M542 Cervicalgia: Secondary | ICD-10-CM | POA: Diagnosis not present

## 2015-09-26 DIAGNOSIS — M25511 Pain in right shoulder: Secondary | ICD-10-CM | POA: Diagnosis not present

## 2015-09-26 DIAGNOSIS — R531 Weakness: Secondary | ICD-10-CM | POA: Diagnosis not present

## 2015-09-28 DIAGNOSIS — M25511 Pain in right shoulder: Secondary | ICD-10-CM | POA: Diagnosis not present

## 2015-09-28 DIAGNOSIS — S161XXD Strain of muscle, fascia and tendon at neck level, subsequent encounter: Secondary | ICD-10-CM | POA: Diagnosis not present

## 2015-09-28 DIAGNOSIS — M542 Cervicalgia: Secondary | ICD-10-CM | POA: Diagnosis not present

## 2015-09-28 DIAGNOSIS — R531 Weakness: Secondary | ICD-10-CM | POA: Diagnosis not present

## 2015-10-02 DIAGNOSIS — R531 Weakness: Secondary | ICD-10-CM | POA: Diagnosis not present

## 2015-10-02 DIAGNOSIS — M25511 Pain in right shoulder: Secondary | ICD-10-CM | POA: Diagnosis not present

## 2015-10-02 DIAGNOSIS — M542 Cervicalgia: Secondary | ICD-10-CM | POA: Diagnosis not present

## 2015-10-02 DIAGNOSIS — S161XXD Strain of muscle, fascia and tendon at neck level, subsequent encounter: Secondary | ICD-10-CM | POA: Diagnosis not present

## 2015-10-05 DIAGNOSIS — M542 Cervicalgia: Secondary | ICD-10-CM | POA: Diagnosis not present

## 2015-10-05 DIAGNOSIS — S161XXD Strain of muscle, fascia and tendon at neck level, subsequent encounter: Secondary | ICD-10-CM | POA: Diagnosis not present

## 2015-10-05 DIAGNOSIS — M25511 Pain in right shoulder: Secondary | ICD-10-CM | POA: Diagnosis not present

## 2015-10-05 DIAGNOSIS — R531 Weakness: Secondary | ICD-10-CM | POA: Diagnosis not present

## 2015-10-09 DIAGNOSIS — M50221 Other cervical disc displacement at C4-C5 level: Secondary | ICD-10-CM | POA: Diagnosis not present

## 2015-10-09 DIAGNOSIS — M25511 Pain in right shoulder: Secondary | ICD-10-CM | POA: Diagnosis not present

## 2015-10-09 DIAGNOSIS — S161XXD Strain of muscle, fascia and tendon at neck level, subsequent encounter: Secondary | ICD-10-CM | POA: Diagnosis not present

## 2015-10-09 DIAGNOSIS — M542 Cervicalgia: Secondary | ICD-10-CM | POA: Diagnosis not present

## 2015-10-12 DIAGNOSIS — S161XXD Strain of muscle, fascia and tendon at neck level, subsequent encounter: Secondary | ICD-10-CM | POA: Diagnosis not present

## 2015-10-12 DIAGNOSIS — M25511 Pain in right shoulder: Secondary | ICD-10-CM | POA: Diagnosis not present

## 2015-10-12 DIAGNOSIS — R531 Weakness: Secondary | ICD-10-CM | POA: Diagnosis not present

## 2015-10-12 DIAGNOSIS — M542 Cervicalgia: Secondary | ICD-10-CM | POA: Diagnosis not present

## 2015-10-31 DIAGNOSIS — K429 Umbilical hernia without obstruction or gangrene: Secondary | ICD-10-CM | POA: Diagnosis not present

## 2015-10-31 DIAGNOSIS — I10 Essential (primary) hypertension: Secondary | ICD-10-CM | POA: Diagnosis not present

## 2015-11-21 DIAGNOSIS — K429 Umbilical hernia without obstruction or gangrene: Secondary | ICD-10-CM | POA: Diagnosis not present

## 2015-11-21 DIAGNOSIS — E0800 Diabetes mellitus due to underlying condition with hyperosmolarity without nonketotic hyperglycemic-hyperosmolar coma (NKHHC): Secondary | ICD-10-CM | POA: Diagnosis not present

## 2015-11-21 DIAGNOSIS — E782 Mixed hyperlipidemia: Secondary | ICD-10-CM | POA: Diagnosis not present

## 2015-11-21 DIAGNOSIS — I10 Essential (primary) hypertension: Secondary | ICD-10-CM | POA: Diagnosis not present

## 2015-11-24 DIAGNOSIS — K439 Ventral hernia without obstruction or gangrene: Secondary | ICD-10-CM | POA: Diagnosis not present

## 2015-12-26 DIAGNOSIS — I10 Essential (primary) hypertension: Secondary | ICD-10-CM | POA: Diagnosis not present

## 2015-12-26 DIAGNOSIS — E0800 Diabetes mellitus due to underlying condition with hyperosmolarity without nonketotic hyperglycemic-hyperosmolar coma (NKHHC): Secondary | ICD-10-CM | POA: Diagnosis not present

## 2015-12-26 DIAGNOSIS — L659 Nonscarring hair loss, unspecified: Secondary | ICD-10-CM | POA: Diagnosis not present

## 2015-12-26 DIAGNOSIS — E782 Mixed hyperlipidemia: Secondary | ICD-10-CM | POA: Diagnosis not present

## 2016-01-06 ENCOUNTER — Emergency Department (HOSPITAL_COMMUNITY): Payer: PPO

## 2016-01-06 ENCOUNTER — Emergency Department (HOSPITAL_COMMUNITY)
Admission: EM | Admit: 2016-01-06 | Discharge: 2016-01-06 | Disposition: A | Discharge status: Home / Self Care | Payer: PPO | Attending: Emergency Medicine | Admitting: Emergency Medicine

## 2016-01-06 ENCOUNTER — Encounter (HOSPITAL_COMMUNITY): Payer: Self-pay

## 2016-01-06 DIAGNOSIS — Z880 Allergy status to penicillin: Secondary | ICD-10-CM | POA: Diagnosis not present

## 2016-01-06 DIAGNOSIS — Z8719 Personal history of other diseases of the digestive system: Secondary | ICD-10-CM | POA: Diagnosis not present

## 2016-01-06 DIAGNOSIS — H538 Other visual disturbances: Secondary | ICD-10-CM | POA: Diagnosis not present

## 2016-01-06 DIAGNOSIS — R51 Headache: Secondary | ICD-10-CM | POA: Insufficient documentation

## 2016-01-06 DIAGNOSIS — Z8639 Personal history of other endocrine, nutritional and metabolic disease: Secondary | ICD-10-CM | POA: Diagnosis not present

## 2016-01-06 DIAGNOSIS — I10 Essential (primary) hypertension: Secondary | ICD-10-CM | POA: Insufficient documentation

## 2016-01-06 DIAGNOSIS — Z79899 Other long term (current) drug therapy: Secondary | ICD-10-CM | POA: Insufficient documentation

## 2016-01-06 DIAGNOSIS — I6522 Occlusion and stenosis of left carotid artery: Secondary | ICD-10-CM | POA: Diagnosis not present

## 2016-01-06 DIAGNOSIS — E559 Vitamin D deficiency, unspecified: Secondary | ICD-10-CM | POA: Diagnosis not present

## 2016-01-06 DIAGNOSIS — M542 Cervicalgia: Secondary | ICD-10-CM

## 2016-01-06 DIAGNOSIS — R519 Headache, unspecified: Secondary | ICD-10-CM

## 2016-01-06 LAB — BASIC METABOLIC PANEL
Anion gap: 9 (ref 5–15)
BUN: 6 mg/dL (ref 6–20)
CALCIUM: 9.6 mg/dL (ref 8.9–10.3)
CO2: 27 mmol/L (ref 22–32)
CREATININE: 0.81 mg/dL (ref 0.44–1.00)
Chloride: 104 mmol/L (ref 101–111)
GFR calc non Af Amer: >60 mL/min (ref >60)
Glucose, Bld: 100 mg/dL — ABNORMAL HIGH (ref 65–99)
Potassium: 4.7 mmol/L (ref 3.5–5.1)
SODIUM: 140 mmol/L (ref 135–145)

## 2016-01-06 LAB — CBC WITH DIFFERENTIAL/PLATELET
BASOS PCT: 0 %
Basophils Absolute: 0 10*3/uL (ref 0.0–0.1)
EOS ABS: 0 10*3/uL (ref 0.0–0.7)
Eosinophils Relative: 0 %
HCT: 34 % — ABNORMAL LOW (ref 36.0–46.0)
Hemoglobin: 10.9 g/dL — ABNORMAL LOW (ref 12.0–15.0)
Lymphocytes Relative: 19 %
Lymphs Abs: 1.2 10*3/uL (ref 0.7–4.0)
MCH: 27.3 pg (ref 26.0–34.0)
MCHC: 32.1 g/dL (ref 30.0–36.0)
MCV: 85 fL (ref 78.0–100.0)
MONO ABS: 0.3 10*3/uL (ref 0.1–1.0)
MONOS PCT: 5 %
Neutro Abs: 4.6 10*3/uL (ref 1.7–7.7)
Neutrophils Relative %: 76 %
Platelets: 195 10*3/uL (ref 150–400)
RBC: 4 MIL/uL (ref 3.87–5.11)
RDW: 14.1 % (ref 11.5–15.5)
WBC: 6.1 10*3/uL (ref 4.0–10.5)

## 2016-01-06 LAB — I-STAT CREATININE, ED: CREATININE: 0.8 mg/dL (ref 0.44–1.00)

## 2016-01-06 MED ORDER — HYDROCODONE-ACETAMINOPHEN 5-325 MG PO TABS: 1.0000 | ORAL_TABLET | ORAL | Status: DC | PRN

## 2016-01-06 MED ORDER — CYCLOBENZAPRINE HCL 10 MG PO TABS
10.0000 mg | ORAL_TABLET | Freq: Three times a day (TID) | ORAL | Status: DC | PRN
Start: 2016-01-06 — End: 2016-01-06

## 2016-01-06 MED ORDER — SODIUM CHLORIDE 0.9 % IV BOLUS (SEPSIS)
1000.0000 mL | Freq: Once | INTRAVENOUS | Status: AC
  Administered 2016-01-06: 1000 mL via INTRAVENOUS

## 2016-01-06 MED ORDER — HYDROCODONE-ACETAMINOPHEN 5-325 MG PO TABS
2.0000 | ORAL_TABLET | Freq: Once | ORAL | Status: AC
  Administered 2016-01-06: 2 via ORAL
  Filled 2016-01-06: qty 2

## 2016-01-06 MED ORDER — DIAZEPAM 5 MG/ML IJ SOLN
5.0000 mg | Freq: Once | INTRAMUSCULAR | Status: AC
  Administered 2016-01-06: 5 mg via INTRAVENOUS
  Filled 2016-01-06: qty 2

## 2016-01-06 MED ORDER — HYDROMORPHONE HCL 1 MG/ML IJ SOLN
1.0000 mg | Freq: Once | INTRAMUSCULAR | Status: AC
  Administered 2016-01-06: 1 mg via INTRAVENOUS
  Filled 2016-01-06: qty 1

## 2016-01-06 MED ORDER — IOPAMIDOL (ISOVUE-370) INJECTION 76%
50.0000 mL | Freq: Once | INTRAVENOUS | Status: AC | PRN
Start: 2016-01-06 — End: 2016-01-06
  Administered 2016-01-06: 50 mL via INTRAVENOUS

## 2016-01-06 MED ORDER — NAPROXEN SODIUM 220 MG PO TABS: 220.0000 mg | ORAL_TABLET | Freq: Two times a day (BID) | ORAL | Status: DC

## 2016-01-06 NOTE — ED Notes (Signed)
Patient here with posterior neck pain and occipital headache since 0900. Denies trauma. Denies any other associated symptoms. Alert and oriented

## 2016-01-06 NOTE — ED Provider Notes (Signed)
CSN: [id number]     Arrival date & time 01/06/16  1251 History   First MD Initiated Contact with Patient 01/06/16 1457     Chief Complaint  Patient presents with  . Headache  . Neck Pain     (Consider location/radiation/quality/duration/timing/severity/associated sxs/prior Treatment) HPI  70 year old female presents with an acute posterior headache and neck pain. Started at 8:30 or 9 AM. States it woke her up out of sleep. Patient states the pain was a 10/10 when she woke up and has progressed to a 15/10 currently. Denies nausea or vomiting. When asked about blurry vision she states she has not noticed any, but when trying to read items in the room she thinks things are blurry. She is wearing her contacts. The headache is so bad she can barely move her neck due to it would increase pain. She cannot walk because any type of movement makes the pain worse. No injury. No fevers. No chest or abdominal pain. No weakness/numbness. Has not taken anything for the pain.  Past Medical History  Diagnosis Date  . Hypertension   . Hypercholesterolemia   . Vitamin D deficiency   . Osteopenia   . Diverticulosis   . Hiatal hernia   . Complication of anesthesia     slow to wake up   Past Surgical History  Procedure Laterality Date  . Fibrocystic breast excision    . Tubal ligation    . Abdominal hysterectomy      Partial  . Laparoscopic ileocecectomy  07/07/2015  . Laparotomy N/A 07/07/2015    Procedure: EXPLORATORY LAPAROTOMY WITH ILEOCECECTOMY ;  Surgeon: Donnie Mesa, MD;  Location: [facility] OR;  Service: General;  Laterality: N/A;   Family History  Problem Relation Age of Onset  . Stroke Mother   . Hypertension Mother   . Stroke Brother   . Cancer Neg Hx   . Alcohol abuse Neg Hx   . Early death Neg Hx   . Hearing loss Neg Hx   . Heart disease Neg Hx   . Hyperlipidemia Neg Hx   . Kidney disease Neg Hx    Social History  Substance Use Topics  . Smoking status: Never Smoker   . Smokeless  tobacco: Never Used  . Alcohol Use: Yes     Comment: 3 times a year   OB History    No data available     Review of Systems  Eyes: Positive for visual disturbance. Negative for pain.  Respiratory: Negative for shortness of breath.   Cardiovascular: Negative for chest pain.  Gastrointestinal: Negative for nausea, vomiting and abdominal pain.  Musculoskeletal: Positive for neck pain.  Neurological: Positive for headaches. Negative for dizziness, weakness and numbness.  All other systems reviewed and are negative.     Allergies  Asa; Diltiazem hcl; Naproxen; Norvasc; Triamterene-hctz; Anesthetics, amide; Indomethacin; Other; Tenex; Hytrin; Lisinopril; and Penicillins  Home Medications   Prior to Admission medications   Medication Sig Start Date End Date Taking? Authorizing Provider  aMILoride (MIDAMOR) 5 MG tablet Take 5 mg by mouth daily. 06/23/15   Historical Provider, MD  bisacodyl (DULCOLAX) 10 MG suppository Place 1 suppository (10 mg total) rectally daily as needed for severe constipation. 07/13/15   Nat Christen, PA-C  Calcium 500 MG tablet Take 600 mg by mouth daily.    Historical Provider, MD  cholecalciferol (VITAMIN D) 1000 UNITS tablet Take 1,000 Units by mouth daily.    Historical Provider, MD  docusate sodium (COLACE) 100 MG capsule  Take 1 capsule (100 mg total) by mouth 2 (two) times daily as needed for mild constipation or moderate constipation. 07/13/15   Nat Christen, PA-C  GARLIC PO Take 1 tablet by mouth daily.    Historical Provider, MD  HYDROcodone-acetaminophen (NORCO/VICODIN) 5-325 MG tablet Take 1-2 tablets by mouth every 6 (six) hours as needed for moderate pain. 07/13/15   Nat Christen, PA-C  magnesium gluconate (MAGONATE) 500 MG tablet Take 500 mg by mouth daily.    Historical Provider, MD  polyethylene glycol powder (MIRALAX) powder Take 8.5-34 g by mouth daily as needed. To correct constipation.  Adjust dose over 1-2 months.  Goal = ~1 bowel movement /  day 07/13/15   Nat Christen, PA-C  sulfamethoxazole-trimethoprim (BACTRIM DS,SEPTRA DS) 800-160 MG tablet Take 1 tablet by mouth 2 (two) times daily. 07/14/15   Emina Riebock, NP   BP 187/83 mmHg  Pulse 72  Temp(Src) 98.4 F (36.9 C) (Oral)  Resp 12  SpO2 100% Physical Exam  Constitutional: She is oriented to person, place, and time. She appears well-developed and well-nourished.  HENT:  Head: Normocephalic and atraumatic.    Right Ear: External ear normal.  Left Ear: External ear normal.  Nose: Nose normal.  Eyes: EOM are normal. Pupils are equal, round, and reactive to light. Right eye exhibits no discharge. Left eye exhibits no discharge.  Neck: Neck supple.  Will not range her neck but it is painful. Difficult to tell if it's pain vs stiffness  Cardiovascular: Normal rate, regular rhythm and normal heart sounds.   Pulmonary/Chest: Effort normal and breath sounds normal.  Abdominal: Soft. There is no tenderness.  Neurological: She is alert and oriented to person, place, and time.  CN 2-12 grossly intact. 5/5 strength in all 4 extremities. Grossly normal sensation. Normal finger to nose. Painful to move extremities because it causes pain  Skin: Skin is warm and dry.  Nursing note and vitals reviewed.   ED Course  Procedures (including critical care time) Labs Review Labs Reviewed  CBC WITH DIFFERENTIAL/PLATELET - Abnormal; Notable for the following:    Hemoglobin 10.9 (*)    HCT 34.0 (*)    All other components within normal limits  BASIC METABOLIC PANEL - Abnormal; Notable for the following:    Glucose, Bld 100 (*)    All other components within normal limits  I-STAT CREATININE, ED    Imaging Review Ct Angio Head W/cm &/or Wo Cm  01/06/2016  CLINICAL DATA:  Acute posterior headache and neck pain with blurry vision since this morning. EXAM: CT ANGIOGRAPHY HEAD AND NECK TECHNIQUE: Multidetector CT imaging of the head and neck was performed using the standard protocol  during bolus administration of intravenous contrast. Multiplanar CT image reconstructions and MIPs were obtained to evaluate the vascular anatomy. Carotid stenosis measurements (when applicable) are obtained utilizing NASCET criteria, using the distal internal carotid diameter as the denominator. CONTRAST:  50 mL Isovue 370 COMPARISON:  Head CT 06/26/2010 FINDINGS: CT HEAD Brain: There is no evidence of acute cortical infarct, intracranial hemorrhage, mass, midline shift, or extra-axial fluid collection. Ventricles and sulci are normal. Calvarium and skull base: No acute osseous abnormality or destructive osseous lesion seen. Paranasal sinuses: Minimal bilateral maxillaries and mild right ethmoid air cell mucosal thickening. Clear mastoid air cells. Orbits: Unremarkable. CTA NECK Aortic arch: 3 vessel aortic arch with mild atheromatous plaque. Brachiocephalic and subclavian arteries are patent. There is mild plaque in the proximal left subclavian artery with luminal irregularity  but no significant stenosis. Right carotid system: Patent without evidence of stenosis, dissection, or aneurysm. At most minimal atherosclerotic irregularity in the proximal ICA. Partial retropharyngeal course of the proximal ICA. Left carotid system: Patent without evidence of stenosis, dissection, or aneurysm. Minimal plaque at the carotid bifurcation and in the proximal ICA. Slightly kinked appearance of the proximal common and proximal internal carotid arteries. Vertebral arteries: The vertebral arteries are patent with the right being minimally larger than the left. Focal calcified plaque at the right vertebral artery origin results in mild stenosis. Focal plaque adjacent to the left vertebral artery origin does not result in stenosis. Skeleton: Mild lower cervical disc degeneration. Other neck: No evidence of acute abnormality or mass. CTA HEAD Anterior circulation: Internal carotid arteries are patent from skullbase to carotid termini  with mild carotid siphon atherosclerosis bilaterally. There is mild narrowing of the left supraclinoid ICA. There is a moderate to severe right MCA origin stenosis, with additional moderate irregular M1 narrowing more distally. There is suggestion of small collateral vessels in this region. The right MCA bifurcation is patent. There is also mild to moderate stenosis of the left MCA origin. Bilateral MCA branch vessel irregularity without major branch occlusion. ACAs are patent with mild diffuse irregularity and at most minimal origins narrowing bilaterally. No intracranial aneurysm is identified. Posterior circulation: Intracranial vertebral arteries are patent to the basilar. The PICAs both arise from the V3 segments. Left AICA is dominant. SCA origins are patent. Basilar artery is patent without stenosis. There is a patent right posterior communicating artery. PCAs are unremarkable. Venous sinuses: Patent. Anatomic variants: None of significance. Delayed phase: No abnormal enhancement. IMPRESSION: 1. No evidence of acute intracranial abnormality. 2. 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 3. Mild left supraclinoid ICA stenosis. 4. Mild right vertebral artery origin stenosis. 5. No extracranial carotid stenosis. Electronically Signed   By: Logan Bores M.D.   On: 01/06/2016 17:55   Ct Angio Neck W/cm &/or Wo/cm  01/06/2016  CLINICAL DATA:  Acute posterior headache and neck pain with blurry vision since this morning. EXAM: CT ANGIOGRAPHY HEAD AND NECK TECHNIQUE: Multidetector CT imaging of the head and neck was performed using the standard protocol during bolus administration of intravenous contrast. Multiplanar CT image reconstructions and MIPs were obtained to evaluate the vascular anatomy. Carotid stenosis measurements (when applicable) are obtained utilizing NASCET criteria, using the distal internal carotid diameter as the denominator. CONTRAST:  50 mL Isovue 370 COMPARISON:   Head CT 06/26/2010 FINDINGS: CT HEAD Brain: There is no evidence of acute cortical infarct, intracranial hemorrhage, mass, midline shift, or extra-axial fluid collection. Ventricles and sulci are normal. Calvarium and skull base: No acute osseous abnormality or destructive osseous lesion seen. Paranasal sinuses: Minimal bilateral maxillaries and mild right ethmoid air cell mucosal thickening. Clear mastoid air cells. Orbits: Unremarkable. CTA NECK Aortic arch: 3 vessel aortic arch with mild atheromatous plaque. Brachiocephalic and subclavian arteries are patent. There is mild plaque in the proximal left subclavian artery with luminal irregularity but no significant stenosis. Right carotid system: Patent without evidence of stenosis, dissection, or aneurysm. At most minimal atherosclerotic irregularity in the proximal ICA. Partial retropharyngeal course of the proximal ICA. Left carotid system: Patent without evidence of stenosis, dissection, or aneurysm. Minimal plaque at the carotid bifurcation and in the proximal ICA. Slightly kinked appearance of the proximal common and proximal internal carotid arteries. Vertebral arteries: The vertebral arteries are patent with the right being minimally larger than the  left. Focal calcified plaque at the right vertebral artery origin results in mild stenosis. Focal plaque adjacent to the left vertebral artery origin does not result in stenosis. Skeleton: Mild lower cervical disc degeneration. Other neck: No evidence of acute abnormality or mass. CTA HEAD Anterior circulation: Internal carotid arteries are patent from skullbase to carotid termini with mild carotid siphon atherosclerosis bilaterally. There is mild narrowing of the left supraclinoid ICA. There is a moderate to severe right MCA origin stenosis, with additional moderate irregular M1 narrowing more distally. There is suggestion of small collateral vessels in this region. The right MCA bifurcation is patent. There is  also mild to moderate stenosis of the left MCA origin. Bilateral MCA branch vessel irregularity without major branch occlusion. ACAs are patent with mild diffuse irregularity and at most minimal origins narrowing bilaterally. No intracranial aneurysm is identified. Posterior circulation: Intracranial vertebral arteries are patent to the basilar. The PICAs both arise from the V3 segments. Left AICA is dominant. SCA origins are patent. Basilar artery is patent without stenosis. There is a patent right posterior communicating artery. PCAs are unremarkable. Venous sinuses: Patent. Anatomic variants: None of significance. Delayed phase: No abnormal enhancement. IMPRESSION: 1. No evidence of acute intracranial abnormality. 2. 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 3. Mild left supraclinoid ICA stenosis. 4. Mild right vertebral artery origin stenosis. 5. No extracranial carotid stenosis. Electronically Signed   By: Logan Bores M.D.   On: 01/06/2016 17:55   I have personally reviewed and evaluated these images and lab results as part of my medical decision-making.   EKG Interpretation None      MDM   Final diagnoses:  Occipital headache  Posterior neck pain    Patient's pain is somewhat better after pain medicine in the ED. She is ranging her neck a little bit better but still has pain. IV Valium tried but this only made patient sleepy. She is able to get up and walk with some assistance but it increases her pain. Afebrile and normal white blood cell count. I highly doubt meningitis as a cause of her symptoms. However with severe headache and neck pain/stiffness I have recommended a lumbar puncture. After discussing the risks/benefits, the patient and husband have talked about it and she declines. I discussed this a couple more times and each time she states she wants to not have the LP performed. She understands the risks of possible misdiagnoses. I discussed that at any  time she can come back and will be reevaluated. Patient will be discharged with hydrocodone. Offered Flexeril but she states she has had this before and it never helps. She is asking for Aleve specifically by prescription. I noted that she had multiple NSAID allergies including naproxen but she states that she can tolerate Aleve without difficulty. Patient will be discharged home to follow-up with her PCP in 2 days. Instructed to return if any symptoms worsen or she develops worse headache or fever.    Sherwood Gambler, MD 01/06/16 2051

## 2016-01-06 NOTE — ED Notes (Signed)
MD at bedside. 

## 2016-01-06 NOTE — Discharge Instructions (Signed)
We discussed doing a lumbar puncture but you declined. If your pain does not improve in 2 days or your pain worsens at any time then return to the ER for immediate re-evaluation. Do not take the vicodin (hydrocodone) and flexeril at the same time (spread them out by at least 2-4 hours)

## 2016-01-30 DIAGNOSIS — E782 Mixed hyperlipidemia: Secondary | ICD-10-CM | POA: Diagnosis not present

## 2016-01-30 DIAGNOSIS — I10 Essential (primary) hypertension: Secondary | ICD-10-CM | POA: Diagnosis not present

## 2016-02-14 DIAGNOSIS — E0800 Diabetes mellitus due to underlying condition with hyperosmolarity without nonketotic hyperglycemic-hyperosmolar coma (NKHHC): Secondary | ICD-10-CM | POA: Diagnosis not present

## 2016-02-14 DIAGNOSIS — E782 Mixed hyperlipidemia: Secondary | ICD-10-CM | POA: Diagnosis not present

## 2016-02-14 DIAGNOSIS — I10 Essential (primary) hypertension: Secondary | ICD-10-CM | POA: Diagnosis not present

## 2016-02-14 DIAGNOSIS — F064 Anxiety disorder due to known physiological condition: Secondary | ICD-10-CM | POA: Diagnosis not present

## 2016-03-20 DIAGNOSIS — E0800 Diabetes mellitus due to underlying condition with hyperosmolarity without nonketotic hyperglycemic-hyperosmolar coma (NKHHC): Secondary | ICD-10-CM | POA: Diagnosis not present

## 2016-03-20 DIAGNOSIS — E782 Mixed hyperlipidemia: Secondary | ICD-10-CM | POA: Diagnosis not present

## 2016-03-20 DIAGNOSIS — I10 Essential (primary) hypertension: Secondary | ICD-10-CM | POA: Diagnosis not present

## 2016-03-20 DIAGNOSIS — F064 Anxiety disorder due to known physiological condition: Secondary | ICD-10-CM | POA: Diagnosis not present

## 2016-03-21 ENCOUNTER — Other Ambulatory Visit: Payer: Self-pay | Admitting: Family Medicine

## 2016-03-21 ENCOUNTER — Ambulatory Visit
Admission: RE | Admit: 2016-03-21 | Discharge: 2016-03-21 | Disposition: A | Discharge status: Home / Self Care | Payer: PPO | Source: Ambulatory Visit | Attending: Family Medicine | Admitting: Family Medicine

## 2016-03-21 DIAGNOSIS — R590 Localized enlarged lymph nodes: Secondary | ICD-10-CM | POA: Diagnosis not present

## 2016-03-21 DIAGNOSIS — R591 Generalized enlarged lymph nodes: Secondary | ICD-10-CM

## 2016-03-25 DIAGNOSIS — I10 Essential (primary) hypertension: Secondary | ICD-10-CM | POA: Diagnosis not present

## 2016-03-25 DIAGNOSIS — E876 Hypokalemia: Secondary | ICD-10-CM | POA: Diagnosis not present

## 2016-04-03 DIAGNOSIS — I10 Essential (primary) hypertension: Secondary | ICD-10-CM | POA: Diagnosis not present

## 2016-04-03 DIAGNOSIS — F064 Anxiety disorder due to known physiological condition: Secondary | ICD-10-CM | POA: Diagnosis not present

## 2016-04-03 DIAGNOSIS — E782 Mixed hyperlipidemia: Secondary | ICD-10-CM | POA: Diagnosis not present

## 2016-05-17 DIAGNOSIS — I10 Essential (primary) hypertension: Secondary | ICD-10-CM | POA: Diagnosis not present

## 2016-07-09 DIAGNOSIS — I10 Essential (primary) hypertension: Secondary | ICD-10-CM | POA: Diagnosis not present

## 2016-07-09 DIAGNOSIS — T7691XA Unspecified adult maltreatment, suspected, initial encounter: Secondary | ICD-10-CM | POA: Diagnosis not present

## 2016-07-09 DIAGNOSIS — F064 Anxiety disorder due to known physiological condition: Secondary | ICD-10-CM | POA: Diagnosis not present

## 2016-07-09 DIAGNOSIS — Z79899 Other long term (current) drug therapy: Secondary | ICD-10-CM | POA: Diagnosis not present

## 2016-09-19 DIAGNOSIS — Z0000 Encounter for general adult medical examination without abnormal findings: Secondary | ICD-10-CM | POA: Diagnosis not present

## 2016-10-04 ENCOUNTER — Telehealth: Payer: Self-pay | Admitting: Family Medicine

## 2016-10-15 DIAGNOSIS — I10 Essential (primary) hypertension: Secondary | ICD-10-CM | POA: Diagnosis not present

## 2016-10-15 DIAGNOSIS — F064 Anxiety disorder due to known physiological condition: Secondary | ICD-10-CM | POA: Diagnosis not present

## 2016-10-15 DIAGNOSIS — E782 Mixed hyperlipidemia: Secondary | ICD-10-CM | POA: Diagnosis not present

## 2016-12-27 DIAGNOSIS — I10 Essential (primary) hypertension: Secondary | ICD-10-CM | POA: Diagnosis not present

## 2016-12-27 DIAGNOSIS — Z0000 Encounter for general adult medical examination without abnormal findings: Secondary | ICD-10-CM | POA: Diagnosis not present

## 2017-01-09 DIAGNOSIS — F064 Anxiety disorder due to known physiological condition: Secondary | ICD-10-CM | POA: Diagnosis not present

## 2017-01-09 DIAGNOSIS — R413 Other amnesia: Secondary | ICD-10-CM | POA: Diagnosis not present

## 2017-01-09 DIAGNOSIS — N3281 Overactive bladder: Secondary | ICD-10-CM | POA: Diagnosis not present

## 2017-01-09 DIAGNOSIS — I10 Essential (primary) hypertension: Secondary | ICD-10-CM | POA: Diagnosis not present

## 2017-02-12 DIAGNOSIS — N3281 Overactive bladder: Secondary | ICD-10-CM | POA: Diagnosis not present

## 2017-02-12 DIAGNOSIS — R413 Other amnesia: Secondary | ICD-10-CM | POA: Diagnosis not present

## 2017-02-12 DIAGNOSIS — F064 Anxiety disorder due to known physiological condition: Secondary | ICD-10-CM | POA: Diagnosis not present

## 2017-02-12 DIAGNOSIS — I10 Essential (primary) hypertension: Secondary | ICD-10-CM | POA: Diagnosis not present

## 2017-06-25 DIAGNOSIS — F064 Anxiety disorder due to known physiological condition: Secondary | ICD-10-CM | POA: Diagnosis not present

## 2017-06-25 DIAGNOSIS — I10 Essential (primary) hypertension: Secondary | ICD-10-CM | POA: Diagnosis not present

## 2017-06-25 DIAGNOSIS — Z23 Encounter for immunization: Secondary | ICD-10-CM | POA: Diagnosis not present

## 2017-06-25 DIAGNOSIS — G44201 Tension-type headache, unspecified, intractable: Secondary | ICD-10-CM | POA: Diagnosis not present

## 2017-07-23 DIAGNOSIS — H524 Presbyopia: Secondary | ICD-10-CM | POA: Diagnosis not present

## 2017-07-23 DIAGNOSIS — H5212 Myopia, left eye: Secondary | ICD-10-CM | POA: Diagnosis not present

## 2017-07-23 DIAGNOSIS — H52223 Regular astigmatism, bilateral: Secondary | ICD-10-CM | POA: Diagnosis not present

## 2017-07-23 DIAGNOSIS — H5201 Hypermetropia, right eye: Secondary | ICD-10-CM | POA: Diagnosis not present

## 2017-08-04 NOTE — Telephone Encounter (Signed)
Created in error

## 2017-08-06 DIAGNOSIS — I10 Essential (primary) hypertension: Secondary | ICD-10-CM | POA: Diagnosis not present

## 2017-08-07 DIAGNOSIS — E782 Mixed hyperlipidemia: Secondary | ICD-10-CM | POA: Diagnosis not present

## 2017-08-07 DIAGNOSIS — F064 Anxiety disorder due to known physiological condition: Secondary | ICD-10-CM | POA: Diagnosis not present

## 2017-08-07 DIAGNOSIS — R7309 Other abnormal glucose: Secondary | ICD-10-CM | POA: Diagnosis not present

## 2017-08-07 DIAGNOSIS — I10 Essential (primary) hypertension: Secondary | ICD-10-CM | POA: Diagnosis not present

## 2017-08-08 DIAGNOSIS — G4489 Other headache syndrome: Secondary | ICD-10-CM | POA: Insufficient documentation

## 2017-08-10 ENCOUNTER — Emergency Department (HOSPITAL_COMMUNITY)
Admission: EM | Admit: 2017-08-10 | Discharge: 2017-08-10 | Disposition: A | Discharge status: Home / Self Care | Payer: PPO | Attending: Emergency Medicine | Admitting: Emergency Medicine

## 2017-08-10 ENCOUNTER — Encounter (HOSPITAL_COMMUNITY): Payer: Self-pay

## 2017-08-10 DIAGNOSIS — L03011 Cellulitis of right finger: Secondary | ICD-10-CM

## 2017-08-10 DIAGNOSIS — I10 Essential (primary) hypertension: Secondary | ICD-10-CM | POA: Diagnosis not present

## 2017-08-10 DIAGNOSIS — Z79899 Other long term (current) drug therapy: Secondary | ICD-10-CM | POA: Insufficient documentation

## 2017-08-10 MED ORDER — AMOXICILLIN-POT CLAVULANATE 875-125 MG PO TABS: 1.0000 | ORAL_TABLET | Freq: Two times a day (BID) | ORAL | 0 refills | Status: DC

## 2017-08-10 MED ORDER — LIDOCAINE HCL (PF) 1 % IJ SOLN
10.0000 mL | Freq: Once | INTRAMUSCULAR | Status: AC
  Administered 2017-08-10: 5 mL via INTRADERMAL
  Filled 2017-08-10: qty 10

## 2017-08-10 NOTE — ED Triage Notes (Signed)
Patient here with swelling to nail bed of right index finger x 2 days, painful to touch, NAD

## 2017-08-10 NOTE — ED Provider Notes (Signed)
[facility] EMERGENCY DEPARTMENT Provider Note   CSN: [id number] Arrival date & time: 08/10/17  3086     History   Chief Complaint No chief complaint on file.   HPI Anne [name] is a 71 y.o. female who presents today for pain and swelling to the finger x 2 days.  She states that she initially noted a hangnail to the proximal nail bed and started pulling on it.  Patient reports that 2 days later, she started noticing feeling and redness to the nail bed.  She has not had any fevers.  She is not taking any medications for the pain.  Patient denies any numbness/weakness.  The history is provided by the patient.    Past Medical History:  Diagnosis Date  . Complication of anesthesia    slow to wake up  . Diverticulosis   . Hiatal hernia   . Hypercholesterolemia   . Hypertension   . Osteopenia   . Vitamin D deficiency     Patient Active Problem List   Diagnosis Date Noted  . Small bowel obstruction ([place]) 07/07/2015  . SBO (small bowel obstruction) ([place]) 07/07/2015  . 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 Back pain 10/21/2014  . Routine general medical examination at a health care facility 07/12/2014  . Hearing loss 09/27/2012  . Other screening mammogram 09/27/2012  . Pure hypercholesterolemia 09/25/2012  . HTN (hypertension) 10/01/2011    Past Surgical History:  Procedure Laterality Date  . ABDOMINAL HYSTERECTOMY     Partial  . fibrocystic breast excision    . LAPAROSCOPIC ILEOCECECTOMY  07/07/2015  . LAPAROTOMY N/A 07/07/2015   Procedure: EXPLORATORY LAPAROTOMY WITH ILEOCECECTOMY ;  Surgeon: Donnie Mesa, MD;  Location: [facility];  Service: General;  Laterality: N/A;  . TUBAL LIGATION      OB History    No data available       Home Medications    Prior to Admission medications   Medication Sig Start Date End Date Taking? Authorizing Provider  amoxicillin-clavulanate (AUGMENTIN) 875-125 MG tablet Take 1 tablet by mouth every 12  (twelve) hours. 08/10/17   Volanda Napoleon, PA-C  Calcium 500 MG tablet Take 500 mg by mouth daily.     [provider]  cholecalciferol (VITAMIN D) 1000 UNITS tablet Take 1,000 Units by mouth daily.    [provider]  diclofenac sodium (VOLTAREN) 1 % GEL Apply 2 g topically 4 (four) times daily as needed (for pain).    [provider]  GARLIC PO Take 1 tablet by mouth daily.    [provider]  HYDROcodone-acetaminophen (NORCO) 5-325 MG tablet Take 1-2 tablets by mouth every 4 (four) hours as needed for severe pain. 01/06/16   Sherwood Gambler, MD  labetalol (NORMODYNE) 200 MG tablet Take 200 mg by mouth 2 (two) times daily. 12/26/15   [provider]  magnesium gluconate (MAGONATE) 500 MG tablet Take 500 mg by mouth daily.    [provider]  naproxen sodium (ALEVE) 220 MG tablet Take 1 tablet (220 mg total) by mouth 2 (two) times daily with a meal. 01/06/16   Sherwood Gambler, MD  NON FORMULARY Place 6 drops under the tongue 3 (three) times daily. "watermelon seed liquid"    [provider]  OVER THE COUNTER MEDICATION Take 1 tablet by mouth 3 (three) times daily. "BP monitor"    [provider]  potassium gluconate 595 (99 K) MG TABS tablet Take 595 mg by mouth 3 (three)  times daily.    [provider]    Family History Family History  Problem Relation Age of Onset  . Stroke Mother   . Hypertension Mother   . Stroke Brother   . Cancer Neg Hx   . Alcohol abuse Neg Hx   . Early death Neg Hx   . Hearing loss Neg Hx   . Heart disease Neg Hx   . Hyperlipidemia Neg Hx   . Kidney disease Neg Hx     Social History Social History   Tobacco Use  . Smoking status: Never Smoker  . Smokeless tobacco: Never Used  Substance Use Topics  . Alcohol use: Yes    Comment: 3 times a year  . Drug use: No     Allergies   Amiloride; Anesthetics, amide; Other; Asa [aspirin]; Diltiazem hcl; Eplerenone; Indomethacin;  Naproxen; Norvasc [amlodipine besylate]; Tenex [guanfacine hcl]; Triamterene-hctz; Evans Lance [terazosin hcl]; and Lisinopril   Review of Systems Review of Systems  Constitutional: Negative for fever.  Skin: Positive for color change.  Neurological: Negative for weakness and numbness.     Physical Exam Updated Vital Signs BP (!) 166/84 (BP Location: Right Arm)   Pulse 73   Temp (!) 97 F (36.1 C) (Oral)   Resp 16   SpO2 99%   Physical Exam  Constitutional: She appears well-developed and well-nourished.  HENT:  Head: Normocephalic and atraumatic.  Eyes: Conjunctivae and EOM are normal. Right eye exhibits no discharge. Left eye exhibits no discharge. No scleral icterus.  Cardiovascular:  Pulses:      Radial pulses are 2+ on the right side, and 2+ on the left side.  Pulmonary/Chest: Effort normal.  Musculoskeletal:  No tenderness palpation noted to the right wrist.  Right second digit has swelling and erythema noted to the proximal and lateral nail bed.  There is some diffuse swelling that extends down to the pulp but no evidence of Phalen.  Good range of motion of all 5 digits of right hand without difficulty.  Neurological: She is alert.  Skin: Skin is warm and dry.  Paronychia noted to the proximal nail bed of the right second digit.  Psychiatric: She has a normal mood and affect. Her speech is normal and behavior is normal.  Nursing note and vitals reviewed.    ED Treatments / Results  Labs (all labs ordered are listed, but only abnormal results are displayed) Labs Reviewed - No data to display  EKG  EKG Interpretation None       Radiology No results found.  Procedures Drain paronychia Date/Time: 08/10/2017 10:39 AM Performed by: Volanda Napoleon, PA-C Authorized by: Volanda Napoleon, PA-C  Consent: Verbal consent obtained. Consent given by: patient Patient understanding: patient states understanding of the procedure being performed Patient consent: the  patient's understanding of the procedure matches consent given Procedure consent: procedure consent matches procedure scheduled Relevant documents: relevant documents present and verified Patient identity confirmed: verbally with patient Local anesthesia used: yes Anesthesia: digital block  Anesthesia: Local anesthesia used: yes Local Anesthetic: lidocaine 1% without epinephrine Patient tolerance: Patient tolerated the procedure well with no immediate complications    (including critical care time)  Medications Ordered in ED Medications  lidocaine (PF) (XYLOCAINE) 1 % injection 10 mL (5 mLs Intradermal Given 08/10/17 1000)     Initial Impression / Assessment and Plan / ED Course  I have reviewed the triage vital signs and the nursing notes.  Pertinent labs & imaging results that were available during  my care of the patient were reviewed by me and considered in my medical decision making (see chart for details).     71 year old female who presents with 2 days of pain and swelling noted to the right second digit.  No fevers, numbness/weakness. Patient is afebrile, non-toxic appearing, sitting comfortably on examination table. Vital signs reviewed and stable.  Patient slightly hypertensive, likely secondary to pain.  Physical exam with evidence of paronychia to the right third digit.  The swelling begins to extend down towards the pulp space but no evidence of a true Phalen.  Plan to drain the paronychia in the department.  Paronychia drained as documented above.  There was purulent drainage noted on I&D.  Given concerns for potential failing, will plan to start patient on antibiotic therapy and have patient follow-up with hand. Wound care instructions given with patient.  Consult to hand placed.  Updated patient on plan.  She is agreeable.  While I was in a another room with a patient, patient came out of the room and told the RN she had to leave because of a speaking engagement. I was  not made aware of this and RN told patient she could leave and come back to pick up her paperwork. I was not informed that patient was leaving. RN informed me after I had left a patient's room that patient had left prior to discharge.   Discussed patient with Dr. Doreatha Martin (hand). Will plan to see patient in outpatient follow-up.    Final Clinical Impressions(s) / ED Diagnoses   Final diagnoses:  Paronychia of finger of right hand    ED Discharge Orders        Ordered    amoxicillin-clavulanate (AUGMENTIN) 875-125 MG tablet  Every 12 hours     08/10/17 0956       Volanda Napoleon, PA-C 08/10/17 1113    Valarie Merino, MD 08/10/17 [phone]

## 2017-08-10 NOTE — ED Notes (Addendum)
Pt is wondering what is taking so long, she reports she has an engagement this am and has to leave soon.  She states the provider told her that she needed to soak her finger for 10 minutes, and she has been soaking longer than that. Explained to pt that the provider has other pts to see, but will make her aware.

## 2017-08-10 NOTE — ED Notes (Signed)
After wound dsg is applied, pt was standing in the hallway waiting for her d/c papers.  Made her aware that the provider is not done with her dc instructions yet.  Made her aware that she can return to pick up her instructions when she is done with her engagement.  Ria Comment EDPA made aware.  Pt was also aware that she needs rx for abx.

## 2017-08-10 NOTE — Discharge Instructions (Signed)
As we discussed, continue using warm soaks to help express drainage.   Keep the wound clean and ry.   Follow-up with the referred hand doctor.   Return to the ED for any worsening pain, redness, swelling, fever or any other worsening or concerning symptoms.

## 2017-08-12 NOTE — Patient Outreach (Signed)
Outreach patient after ED visit.  Patient verified PCP is current and would schedule a follow up appointment with PCP.  Patient states that she knows how to reach Dr. Criss Rosales after hours.   I explained [place] and 24 Hour Nurse Advice Line to patient and she made note of 24 Hour Nurse Advice Line Number.  I explained I would mail information regarding [facility] and she said that would be fine.  Patient was not interested in a follow up call or answering engagement tool questions.  Successful letter, magnet and know before you go mailed 08/12/17.

## 2017-08-13 DIAGNOSIS — R42 Dizziness and giddiness: Secondary | ICD-10-CM | POA: Diagnosis not present

## 2017-08-13 DIAGNOSIS — R413 Other amnesia: Secondary | ICD-10-CM | POA: Insufficient documentation

## 2017-08-13 DIAGNOSIS — G4489 Other headache syndrome: Secondary | ICD-10-CM | POA: Diagnosis not present

## 2017-08-18 ENCOUNTER — Ambulatory Visit: Payer: PPO | Admitting: Neurology

## 2017-08-22 ENCOUNTER — Encounter: Payer: PPO | Attending: Psychology | Admitting: Psychology

## 2017-09-13 DIAGNOSIS — G4489 Other headache syndrome: Secondary | ICD-10-CM | POA: Diagnosis not present

## 2017-09-18 DIAGNOSIS — G4489 Other headache syndrome: Secondary | ICD-10-CM | POA: Diagnosis not present

## 2017-11-17 DIAGNOSIS — F064 Anxiety disorder due to known physiological condition: Secondary | ICD-10-CM | POA: Diagnosis not present

## 2017-11-17 DIAGNOSIS — I10 Essential (primary) hypertension: Secondary | ICD-10-CM | POA: Diagnosis not present

## 2017-11-20 DIAGNOSIS — R7309 Other abnormal glucose: Secondary | ICD-10-CM | POA: Diagnosis not present

## 2017-11-20 DIAGNOSIS — I10 Essential (primary) hypertension: Secondary | ICD-10-CM | POA: Diagnosis not present

## 2017-11-20 DIAGNOSIS — E782 Mixed hyperlipidemia: Secondary | ICD-10-CM | POA: Diagnosis not present

## 2017-11-20 DIAGNOSIS — F064 Anxiety disorder due to known physiological condition: Secondary | ICD-10-CM | POA: Diagnosis not present

## 2018-02-17 DIAGNOSIS — R7309 Other abnormal glucose: Secondary | ICD-10-CM | POA: Diagnosis not present

## 2018-02-17 DIAGNOSIS — E782 Mixed hyperlipidemia: Secondary | ICD-10-CM | POA: Diagnosis not present

## 2018-02-17 DIAGNOSIS — M62830 Muscle spasm of back: Secondary | ICD-10-CM | POA: Diagnosis not present

## 2018-02-17 DIAGNOSIS — F064 Anxiety disorder due to known physiological condition: Secondary | ICD-10-CM | POA: Diagnosis not present

## 2018-02-18 DIAGNOSIS — F064 Anxiety disorder due to known physiological condition: Secondary | ICD-10-CM | POA: Diagnosis not present

## 2018-02-18 DIAGNOSIS — E782 Mixed hyperlipidemia: Secondary | ICD-10-CM | POA: Diagnosis not present

## 2018-02-18 DIAGNOSIS — R7309 Other abnormal glucose: Secondary | ICD-10-CM | POA: Diagnosis not present

## 2018-04-03 DIAGNOSIS — I10 Essential (primary) hypertension: Secondary | ICD-10-CM | POA: Diagnosis not present

## 2018-04-03 DIAGNOSIS — E782 Mixed hyperlipidemia: Secondary | ICD-10-CM | POA: Diagnosis not present

## 2018-04-03 DIAGNOSIS — R7309 Other abnormal glucose: Secondary | ICD-10-CM | POA: Diagnosis not present

## 2018-04-03 DIAGNOSIS — F064 Anxiety disorder due to known physiological condition: Secondary | ICD-10-CM | POA: Diagnosis not present

## 2018-07-05 ENCOUNTER — Encounter (HOSPITAL_COMMUNITY): Payer: Self-pay

## 2018-07-05 ENCOUNTER — Emergency Department (HOSPITAL_COMMUNITY)
Admission: EM | Admit: 2018-07-05 | Discharge: 2018-07-05 | Disposition: A | Discharge status: Home / Self Care | Payer: PPO | Attending: Emergency Medicine | Admitting: Emergency Medicine

## 2018-07-05 DIAGNOSIS — I10 Essential (primary) hypertension: Secondary | ICD-10-CM | POA: Diagnosis not present

## 2018-07-05 DIAGNOSIS — M542 Cervicalgia: Secondary | ICD-10-CM | POA: Diagnosis not present

## 2018-07-05 DIAGNOSIS — Z79899 Other long term (current) drug therapy: Secondary | ICD-10-CM | POA: Insufficient documentation

## 2018-07-05 MED ORDER — METHOCARBAMOL 500 MG PO TABS
500.0000 mg | ORAL_TABLET | Freq: Two times a day (BID) | ORAL | 0 refills | Status: DC
Start: 2018-07-05 — End: 2019-12-22

## 2018-07-05 MED ORDER — METHOCARBAMOL 500 MG PO TABS
1000.0000 mg | ORAL_TABLET | Freq: Once | ORAL | Status: AC
  Administered 2018-07-05: 1000 mg via ORAL
  Filled 2018-07-05: qty 2

## 2018-07-05 MED ORDER — DEXAMETHASONE SODIUM PHOSPHATE 10 MG/ML IJ SOLN
10.0000 mg | Freq: Once | INTRAMUSCULAR | Status: AC
  Administered 2018-07-05: 10 mg via INTRAMUSCULAR
  Filled 2018-07-05: qty 1

## 2018-07-05 NOTE — ED Provider Notes (Signed)
[facility] EMERGENCY DEPARTMENT Provider Note   CSN: [id number] Arrival date & time: 07/05/18  1029   History   Chief Complaint Neck pain  HPI Anne [name] is a 72 y.o. female with past medical history significant for hypertension, chronic back pain, osteopenia who presents for evaluation of neck pain.  Patient states that she has had neck pain that starts at the posterior head and radiates out over the bilateral shoulders and over the neck. States she feels like her neck, upper back and shoulders are "tight feeling." Denies known trauma or injury to the area. No recent MVC.  Patient states she has tried Aleve twice as well as heat without relief of symptoms.  States that she woke up with the symptoms approximately 2 days ago.  Admits to using a new pillow that evening.  Denies aggravating or alleviating factors.  Denies fever, chills, headache, blurred vision, numbness and tingling in her extremities, weakness.  History obtained from patient and husband.  No interpreter was used.  HPI  Past Medical History:  Diagnosis Date  . Complication of anesthesia    slow to wake up  . Diverticulosis   . Hiatal hernia   . Hypercholesterolemia   . Hypertension   . Osteopenia   . Vitamin D deficiency     Patient Active Problem List   Diagnosis Date Noted  . Small bowel obstruction ([place]) 07/07/2015  . SBO (small bowel obstruction) ([place]) 07/07/2015  . 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 Back pain 10/21/2014  . Routine general medical examination at a health care facility 07/12/2014  . Hearing loss 09/27/2012  . Other screening mammogram 09/27/2012  . Pure hypercholesterolemia 09/25/2012  . HTN (hypertension) 10/01/2011    Past Surgical History:  Procedure Laterality Date  . ABDOMINAL HYSTERECTOMY     Partial  . fibrocystic breast excision    . LAPAROSCOPIC ILEOCECECTOMY  07/07/2015  . LAPAROTOMY N/A 07/07/2015   Procedure: EXPLORATORY LAPAROTOMY WITH  ILEOCECECTOMY ;  Surgeon: Donnie Mesa, MD;  Location: [facility];  Service: General;  Laterality: N/A;  . TUBAL LIGATION       OB History   None      Home Medications    Prior to Admission medications   Medication Sig Start Date End Date Taking? Authorizing Provider  amoxicillin-clavulanate (AUGMENTIN) 875-125 MG tablet Take 1 tablet by mouth every 12 (twelve) hours. 08/10/17   Volanda Napoleon, PA-C  Calcium 500 MG tablet Take 500 mg by mouth daily.     [provider]  cholecalciferol (VITAMIN D) 1000 UNITS tablet Take 1,000 Units by mouth daily.    [provider]  diclofenac sodium (VOLTAREN) 1 % GEL Apply 2 g topically 4 (four) times daily as needed (for pain).    [provider]  GARLIC PO Take 1 tablet by mouth daily.    [provider]  HYDROcodone-acetaminophen (NORCO) 5-325 MG tablet Take 1-2 tablets by mouth every 4 (four) hours as needed for severe pain. 01/06/16   Sherwood Gambler, MD  labetalol (NORMODYNE) 200 MG tablet Take 200 mg by mouth 2 (two) times daily. 12/26/15   [provider]  magnesium gluconate (MAGONATE) 500 MG tablet Take 500 mg by mouth daily.    [provider]  methocarbamol (ROBAXIN) 500 MG tablet Take 1 tablet (500 mg total) by mouth 2 (two) times daily. 07/05/18   Latonya Nelon A, PA-C  naproxen sodium (ALEVE) 220 MG tablet Take 1 tablet (220 mg total)  by mouth 2 (two) times daily with a meal. 01/06/16   Sherwood Gambler, MD  NON FORMULARY Place 6 drops under the tongue 3 (three) times daily. "watermelon seed liquid"    [provider]  OVER THE COUNTER MEDICATION Take 1 tablet by mouth 3 (three) times daily. "BP monitor"    [provider]  potassium gluconate 595 (99 K) MG TABS tablet Take 595 mg by mouth 3 (three) times daily.    [provider]    Family History Family History  Problem Relation Age of Onset  . Stroke Mother   . Hypertension Mother   . Stroke Brother     . Cancer Neg Hx   . Alcohol abuse Neg Hx   . Early death Neg Hx   . Hearing loss Neg Hx   . Heart disease Neg Hx   . Hyperlipidemia Neg Hx   . Kidney disease Neg Hx     Social History Social History   Tobacco Use  . Smoking status: Never Smoker  . Smokeless tobacco: Never Used  Substance Use Topics  . Alcohol use: Yes    Comment: 3 times a year  . Drug use: No     Allergies   Amiloride; Anesthetics, amide; Other; Asa [aspirin]; Diltiazem hcl; Eplerenone; Indomethacin; Naproxen; Norvasc [amlodipine besylate]; Tenex [guanfacine hcl]; Triamterene-hctz; Evans Lance [terazosin hcl]; and Lisinopril   Review of Systems Review of Systems  Constitutional: Negative.   HENT: Negative.   Respiratory: Negative.   Cardiovascular: Negative.   Gastrointestinal: Negative.   Genitourinary: Negative.   Musculoskeletal: Positive for neck pain and neck stiffness.  Skin: Negative.   Neurological: Negative.   All other systems reviewed and are negative.    Physical Exam Updated Vital Signs BP (!) 170/88 (BP Location: Right Arm)   Pulse 77   Temp 98.5 F (36.9 C) (Oral)   Resp 17   SpO2 100%   Physical Exam  Physical Exam  Constitutional: Pt appears well-developed and well-nourished. No distress.  HENT:  Head: Normocephalic and atraumatic.  Mouth/Throat: Oropharynx is clear and moist. No oropharyngeal exudate.  Eyes: Conjunctivae are normal.  Neck: Normal range of motion. Neck supple.  Neck without edema, erythema or rigidity.  No spinous process tenderness.  Muscular tenderness over bilateral trapezius muscles.  Negative Kernig and Brudzinski sign. Cardiovascular: Normal rate, regular rhythm and intact distal pulses.   Pulmonary/Chest: Effort normal and breath sounds normal. No respiratory distress. Pt has no wheezes.  Abdominal: Soft. Pt exhibits no distension. There is no tenderness, rebound or guarding. No abd bruit or pulsatile mass Musculoskeletal:  Full range of motion of  the T-spine and L-spine with flexion, hyperextension, and lateral flexion. No midline tenderness or stepoffs. No tenderness to palpation of the spinous processes of the T-spine or L-spine. No tenderness to palpation of the paraspinous muscles of the L-spine. Lymphadenopathy:    Pt has no cervical adenopathy.  Neurological: Pt is alert. Pt has normal reflexes.  Reflex Scores:      Bicep reflexes are 2+ on the right side and 2+ on the left side.      Brachioradialis reflexes are 2+ on the right side and 2+ on the left side.      Patellar reflexes are 2+ on the right side and 2+ on the left side.      Achilles reflexes are 2+ on the right side and 2+ on the left side. Speech is clear and goal oriented, follows commands Normal 5/5 strength in upper  and lower extremities bilaterally including dorsiflexion and plantar flexion, strong and equal grip strength Sensation normal to light and sharp touch Moves extremities without ataxia, coordination intact Normal gait Normal balance No Clonus Skin: Skin is warm and dry. No rash noted or lesions noted. Pt is not diaphoretic. No erythema, ecchymosis,edema or warmth.  Psychiatric: Pt has a normal mood and affect. Behavior is normal.  Nursing note and vitals reviewed. ED Treatments / Results  Labs (all labs ordered are listed, but only abnormal results are displayed) Labs Reviewed - No data to display  EKG None  Radiology No results found.  Procedures Procedures (including critical care time)  Medications Ordered in ED Medications  methocarbamol (ROBAXIN) tablet 1,000 mg (1,000 mg Oral Given 07/05/18 1255)  dexamethasone (DECADRON) injection 10 mg (10 mg Intramuscular Given 07/05/18 1255)     Initial Impression / Assessment and Plan / ED Course  I have reviewed the triage vital signs and the nursing notes.  Pertinent labs & imaging results that were available during my care of the patient were reviewed by me and considered in my medical  decision making (see chart for details).  72 year old female who appears otherwise well presents for evaluation of neck pain with stiffness.  Began 2 days ago after she awoke from using a new pillow at night.  Negative neurologic exam without neuro deficits. Will obtain plain film neck, however I highly suspect this is musculoskeletal in nature.  On reevaluation Patient with improved pain and does not want imaging at this time. Discussed risk vs benefits of obtaining imaging.  Patient and husband continue to decline imaging. Most likely MSK spasms. Will dc home symptomatic treatment.  Discussed strict return precautions.  Encourage patient follow-up with primary care provider if she has continued symptoms.  Patient stable for discharge at this time.  Patient voiced understanding and is agreeable for follow-up.    Final Clinical Impressions(s) / ED Diagnoses   Final diagnoses:  Neck pain    ED Discharge Orders         Ordered    methocarbamol (ROBAXIN) 500 MG tablet  2 times daily     07/05/18 1352           Mar Walmer A, PA-C 07/05/18 [place], Dan, DO 07/05/18 1610

## 2018-07-05 NOTE — Discharge Instructions (Addendum)
Evaluated today for neck pain.  We have given you a steroid shot which will last approximately 3 to 4 days.  I have also given you a muscle relaxer, Robaxin.  Please take this as prescribed.  You may also take additional Tylenol as needed for pain.  I would suggest heat to the area of your neck.  Please follow-up with your primary care provider if you have continued symptoms  Return to the ED for new or worsening symptoms such as:  Contact a health care provider if: You have symptoms that get worse or do not get better after 2 weeks of treatment. You have pain that gets worse or does not get better with medicine. You develop new, unexplained symptoms. You have sores or irritated skin on your neck from wearing your cervical collar. Get help right away if: You have severe pain. You develop numbness, tingling, or weakness in any part of your body. You cannot move a part of your body (you have paralysis). You have neck pain along with: Severe dizziness. Headache.

## 2018-07-05 NOTE — ED Triage Notes (Signed)
Patient complains of posterior neck/shoulder pain since yesterday am upon awakening. Has tried aleve and heat with no relief, denies trauma. Pain with ROM

## 2018-07-09 ENCOUNTER — Ambulatory Visit
Admission: RE | Admit: 2018-07-09 | Discharge: 2018-07-09 | Disposition: A | Discharge status: Home / Self Care | Payer: PPO | Source: Ambulatory Visit | Attending: Family Medicine | Admitting: Family Medicine

## 2018-07-09 ENCOUNTER — Other Ambulatory Visit: Payer: Self-pay | Admitting: Family Medicine

## 2018-07-09 ENCOUNTER — Other Ambulatory Visit: Payer: Self-pay

## 2018-07-09 DIAGNOSIS — Z1231 Encounter for screening mammogram for malignant neoplasm of breast: Secondary | ICD-10-CM

## 2018-07-09 DIAGNOSIS — R5381 Other malaise: Secondary | ICD-10-CM

## 2018-07-23 DIAGNOSIS — R7309 Other abnormal glucose: Secondary | ICD-10-CM | POA: Diagnosis not present

## 2018-07-23 DIAGNOSIS — E782 Mixed hyperlipidemia: Secondary | ICD-10-CM | POA: Diagnosis not present

## 2018-07-23 DIAGNOSIS — F064 Anxiety disorder due to known physiological condition: Secondary | ICD-10-CM | POA: Diagnosis not present

## 2018-07-23 DIAGNOSIS — I10 Essential (primary) hypertension: Secondary | ICD-10-CM | POA: Diagnosis not present

## 2018-07-29 DIAGNOSIS — Z6825 Body mass index (BMI) 25.0-25.9, adult: Secondary | ICD-10-CM | POA: Diagnosis not present

## 2018-07-29 DIAGNOSIS — E7800 Pure hypercholesterolemia, unspecified: Secondary | ICD-10-CM | POA: Diagnosis not present

## 2018-07-29 DIAGNOSIS — Z0000 Encounter for general adult medical examination without abnormal findings: Secondary | ICD-10-CM | POA: Diagnosis not present

## 2018-07-29 DIAGNOSIS — I10 Essential (primary) hypertension: Secondary | ICD-10-CM | POA: Diagnosis not present

## 2018-07-31 ENCOUNTER — Other Ambulatory Visit: Payer: Self-pay | Admitting: Family Medicine

## 2018-07-31 ENCOUNTER — Ambulatory Visit
Admission: RE | Admit: 2018-07-31 | Discharge: 2018-07-31 | Disposition: A | Discharge status: Home / Self Care | Payer: PPO | Source: Ambulatory Visit | Attending: Family Medicine | Admitting: Family Medicine

## 2018-07-31 DIAGNOSIS — R0602 Shortness of breath: Secondary | ICD-10-CM

## 2018-07-31 DIAGNOSIS — R918 Other nonspecific abnormal finding of lung field: Secondary | ICD-10-CM | POA: Diagnosis not present

## 2018-08-12 DIAGNOSIS — Z0000 Encounter for general adult medical examination without abnormal findings: Secondary | ICD-10-CM | POA: Diagnosis not present

## 2018-08-17 ENCOUNTER — Telehealth: Payer: Self-pay | Admitting: Psychology

## 2018-08-18 ENCOUNTER — Encounter: Payer: Self-pay | Admitting: Psychology

## 2018-08-25 ENCOUNTER — Encounter: Payer: PPO | Attending: Psychology | Admitting: Psychology

## 2018-08-25 DIAGNOSIS — R413 Other amnesia: Secondary | ICD-10-CM | POA: Diagnosis not present

## 2018-08-25 DIAGNOSIS — F801 Expressive language disorder: Secondary | ICD-10-CM | POA: Diagnosis not present

## 2018-09-18 ENCOUNTER — Encounter: Payer: PPO | Admitting: Psychology

## 2018-10-05 ENCOUNTER — Ambulatory Visit: Payer: PPO | Admitting: Psychology

## 2018-10-08 ENCOUNTER — Encounter: Payer: Self-pay | Admitting: Psychology

## 2018-10-08 NOTE — Progress Notes (Addendum)
Neuropsychological Consultation   Patient:   Anne [name]   DOB:   [birth year]  MR Number:  [id number]  Location:  [facility] [place], [place] [id number] [address] [address] Dept: [phone]           Date of Service:   08/25/2018  Start Time:   8 AM End Time:   9 AM  Provider/Observer:  Ilean Skill, Psy.D.       Clinical Neuropsychologist       Billing Code/Service: Neurobehavioral status exam  Chief Complaint:    Anne [name] is a 73 year old female referred by Dr. Darlyne Russian due to issues related to memory loss, changes in naming and word finding issues, spelling changes for the past year.  Reason for Service:  Anne [name] a 73 year old female referred by Dr. Criss Rosales due to increasing symptoms and experiences of memory loss, changes in expressive language, word finding issues, changes in spelling and difficulty remembering people's names.  The patient reports that these difficulties have been identified and first noticed about 1 year ago and appears to be progressively worsening.  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  However, the patient reports that she is having increasing difficulty with church members names and will look at the face and cannot remember what their names are or remember family members.  Patient reports that she has difficulty had a spell even simple words at times and forgets where she puts things.  The patient has a long history of significant headaches but this is not particularly problematic at this time.  However, the patient does have significant issues of high blood pressure, being hypoglycemic,  hypokalemic and had a collapse in July 2010 due to high dehydration resulting from medication she was taken for hypokalemia.  She reports that these issues have been significantly improved.  Current Status:  And the patient describes memory loss, word finding and name finding issues, forgetting where she is placed objects and forgetting names of the important people in her church.  The patient reports that this is been progressively worsening over the past year.  The patient reports that she has not been sleeping well and only gets about 3 hours of sleep at night and has little is no sleep.  She reports that her appetite is good.  Reliability of Information: The information is derived from 1 hour face-to-face clinical interview with the patient as well as review of available medical records.  Behavioral Observation: Anne [name]  presents as a 73 y.o.-year-old Right African American Female who appeared her stated age. her dress was Appropriate and she was Well Groomed and her manners were Appropriate to the situation.  her participation was indicative of Appropriate and Attentive behaviors.  There were not any physical disabilities noted.  she displayed an appropriate level of cooperation and motivation.     Interactions:    Active Appropriate and Attentive  Attention:   within normal limits and attention span and concentration were age appropriate  Memory:   abnormal; remote memory intact, recent memory impaired  Visuo-spatial:  not examined  Speech (Volume):  low  Speech:   normal; normal  Thought Process:  Coherent and Relevant  Though Content:  WNL; not suicidal and not homicidal  Orientation:   person, place, time/date and situation  Judgment:   Good  Planning:   Good  Affect:    Appropriate  Mood:    Dysphoric  Insight:   Good  Intelligence:   high  Marital Status/Living: The patient was born in [place] and grew up with 8 siblings.  The patient currently lives  with her spouse and they have been married for 66 years.  The patient has 2 adult children with her husband.  Current Employment: The patient is tired.  The patient has been very active in her church.  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  Hobbies and interests include piano, sewing, golfing, going to the gym, and Bible research.  Substance Use:  No concerns of substance abuse are reported.    Education:   Engineering geologist History:   Past Medical History:  Diagnosis Date  . Complication of anesthesia    slow to wake up  . Diverticulosis   . Hiatal hernia   . Hypercholesterolemia   . Hypertension   . Osteopenia   . Vitamin D deficiency            Abuse/Trauma History: The patient denies any history of abuse or trauma.  Psychiatric History:  The patient has no prior psychiatric history.  Family Med/Psych History:  Family History  Problem Relation Age of Onset  . Stroke Mother   . Hypertension Mother   . Stroke Brother   . Cancer Neg Hx   . Alcohol abuse Neg Hx   . Early death Neg Hx   . Hearing loss Neg Hx   . Heart disease Neg Hx   . Hyperlipidemia Neg Hx   . Kidney disease Neg Hx     Risk of Suicide/Violence: virtually non-existent the patient denies any suicidal or homicidal ideation.  Impression/DX:  Anne [name] a 73 year old female referred by Dr. Criss Rosales due to increasing symptoms and experiences of memory loss, changes in expressive language, word finding issues, changes in spelling and difficulty remembering people's names.  The patient reports that these difficulties have been identified and first noticed about 1 year ago and appears to be progressively worsening.  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  However, the patient reports that she is having increasing difficulty with church members names and will look at the face and cannot remember what their names are or remember family members.  Patient reports that she has difficulty had a spell even simple words at times and forgets where she puts things.  The patient has a long history of significant headaches but this is not particularly problematic at this time.  However, the patient does have significant issues of high blood pressure, being hypoglycemic, hypokalemic and had a collapse in July 2010 due to high dehydration resulting from medication she was taken for hypokalemia.  She reports that these issues have been significantly improved.  The patient describes memory loss, word finding and name finding issues, forgetting where she is placed objects and forgetting names of the important people in her  church.  The patient reports that this is been progressively worsening over the past year.  The patient reports that she has not been sleeping well and only gets about 3 hours of sleep at night and has little is no sleep.  She reports that her appetite is good.   Disposition/Plan:  We have set the patient up for formal neuropsychological testing that will include both the Wechsler Adult Intelligence Scale as well as the Wechsler Memory Scale.  Once these test measures have been administered they will be interpreted and a formal report will be written and provided to the patient as well as the referring physician.  Diagnosis:    Memory loss of unknown cause  Expressive language impairment         Electronically Signed   _______________________ Ilean Skill, Psy.D.

## 2018-10-09 ENCOUNTER — Encounter: Payer: PPO | Admitting: Psychology

## 2018-10-19 ENCOUNTER — Encounter: Payer: PPO | Attending: Family Medicine | Admitting: Psychology

## 2018-10-19 DIAGNOSIS — Z72820 Sleep deprivation: Secondary | ICD-10-CM | POA: Diagnosis not present

## 2018-10-19 DIAGNOSIS — F411 Generalized anxiety disorder: Secondary | ICD-10-CM | POA: Diagnosis not present

## 2018-10-19 DIAGNOSIS — R413 Other amnesia: Secondary | ICD-10-CM | POA: Insufficient documentation

## 2018-10-19 DIAGNOSIS — F801 Expressive language disorder: Secondary | ICD-10-CM | POA: Insufficient documentation

## 2018-10-23 ENCOUNTER — Encounter: Payer: PPO | Admitting: Psychology

## 2018-10-23 DIAGNOSIS — R413 Other amnesia: Secondary | ICD-10-CM

## 2018-10-26 ENCOUNTER — Encounter: Payer: Self-pay | Admitting: Psychology

## 2018-10-26 NOTE — Progress Notes (Signed)
BEHAVIOR OBSERVATIONS: Patient was on time to her 1:00pm testing appointment. She was administered the Wechsler Memory Scale, 4th Edition, Older Adult Battery. The evaluation lasted around 90 minutes. Her participation was indicative of appropriate and redirectable behaviors.  She was appropriately dressed and well groomed. She displayed an appropriate level of cooperation and motivation. Mood was slightly anxious but euthymic overall. Affect was appropriate and congruent with mood. She appeared somewhat critical of herself and made several self-deprecating remarks about her abilities. Thought processes appeared linear and goal directed.   Of note, patient expressed interest in participating in individual psychotherapy to develop compensatory strategies that might help with forgetfulness and innattention. She also expressed willingness to explore negative self talk and improve overall mood and wellbeing.    Results of the WMS-IV (Older Adult Battery) are as follows:  Brief Cognitive Status Exam Classification  Age Years of Education Raw Score Classification Level Base Rate  68 years 8 months 13 56 Average 100.0   Index Score Summary  Index Sum of Scaled Scores Index Score Percentile Rank 95% Confidence Interval Qualitative Descriptor  Auditory Memory (AMI) 52 118 88 111-123 High Average  Visual Memory (VMI) 20 100 50 95-105 Average  Immediate Memory (IMI) 38 117 87 110-122 High Average  Delayed Memory (DMI) 34 108 70 100-115 Average   Primary Subtest Scaled Score Summary  Subtest Domain Raw Score Scaled Score Percentile Rank  Logical Memory I AM 40 13 84  Logical Memory II AM 23 12 75  Verbal [employer] I AM 28 13 84  Verbal [employer] II AM 9 14 91  Visual Reproduction I VM 37 12 75  Visual Reproduction II VM 15 8 25   Symbol Span VWM 17 9 37   PROCESS SCORE CONVERSIONS  Auditory Memory Process Score Summary  Process Score Raw Score Scaled Score Percentile Rank  Cumulative Percentage (Base Rate)  LM II Recognition 21 - - >75%  VPA II Recognition 30 - - >75%   Visual Memory Process Score Summary  Process Score Raw Score Scaled Score Percentile Rank Cumulative Percentage (Base Rate)  VR II Recognition 6 - - >75%

## 2018-10-26 NOTE — Progress Notes (Signed)
BEHAVIOR OBSERVATIONS: Patient was on time to her 1:00pm testing appointment. She was administered the Wechsler Adult Intelligence Scale, 4th Edition. The evaluation lasted around 90 minutes. Her participation was indicative of appropriate and redirectable behaviors.  She was appropriately dressed and well groomed. She displayed an appropriate level of cooperation and motivation. Mood was slightly anxious but euthymic overall. Affect was appropriate and congruent with mood. She appeared somewhat critical of herself and made several self-deprecating remarks about her abilities. Thought processes appeared linear and goal directed. Next testing session scheduled for 10/24/15.   Results of the WAIS-IV are as follows:  Composite Score Summary  Scale Sum of Scaled Scores Composite Score Percentile Rank 95% Conf. Interval Qualitative Description  Verbal Comprehension 42 VCI 122 93 115-127 Superior  Perceptual Reasoning 34 PRI 107 68 100-113 Average  Working Memory 24 WMI 111 77 104-117 High Average  Processing Speed 21 PSI 102 55 93-110 Average  Full Scale 121 FSIQ 114 82 110-118 High Average  General Ability 76 GAI 117 87 112-121 High Average   Index Level Discrepancy Comparisons  Comparison Score 1 Score 2 Difference Critical Value .05 Significant Difference Y/N Base Rate by Overall Sample  VCI - PRI 122 107 15 8.31 Y 13.0  VCI - WMI 122 111 11 8.82 Y 19.7  VCI - PSI 122 102 20 10.19 Y 11.0   Differences Between Subtest and Overall Mean of Subtest Scores  Subtest Subtest Scaled Score Mean Scaled Score Difference Critical Value .05 Strength or Weakness Base Rate  Vocabulary 17 12.10 4.90 2.03 S 1-2%  Symbol Search 8 12.10 -4.10 3.42 W 5-10%

## 2018-10-27 ENCOUNTER — Encounter: Payer: Self-pay | Admitting: Psychology

## 2018-10-27 ENCOUNTER — Encounter (HOSPITAL_BASED_OUTPATIENT_CLINIC_OR_DEPARTMENT_OTHER): Payer: PPO | Admitting: Psychology

## 2018-10-27 DIAGNOSIS — F411 Generalized anxiety disorder: Secondary | ICD-10-CM

## 2018-10-27 DIAGNOSIS — R413 Other amnesia: Secondary | ICD-10-CM | POA: Diagnosis not present

## 2018-10-27 DIAGNOSIS — Z72820 Sleep deprivation: Secondary | ICD-10-CM | POA: Diagnosis not present

## 2018-10-27 NOTE — Progress Notes (Signed)
Neuropsychological Evaluation   Patient:  Anne [name]   DOB: 1946-06-16  MR Number: [id number]  Location: [place] [facility] AND REH[facility]IL[facility]ATION [place], [place] [id number] [place] [address] Dept: [phone]  Start: 8 AM End: 9 AM  Provider/Observer:     Edgardo Roys PsyD  Chief Complaint:      Chief Complaint  Patient presents with  . Memory Loss    Reason For Service:     [name] a 73 year old female referred by Dr. Criss Rosales due to increasing symptoms and experiences of memory loss, changes in expressive language, word finding issues, changes in spelling and difficulty remembering people's names.  The patient reports that these difficulties have been identified and first noticed about 1 year ago and appears to be progressively worsening.  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  However, the patient reports that she is having increasing difficulty with church members names and will look at the face and cannot remember what their names are or remember family members.  Patient reports that she has difficulty had a spell even simple words at times and forgets where she puts things.  The patient has a long history of significant headaches but this is not particularly problematic at this time.  However, the patient does have significant issues of high blood pressure, being hypoglycemic, hypokalemic and had a collapse in July 2010 due to high dehydration resulting from medication she was taken for hypokalemia.  She reports that these issues have been significantly improved.  The patient describes memory loss, word finding and name finding issues, forgetting  where she is placed objects and forgetting names of the important people in her church.  The patient reports that this is been progressively worsening over the past year.  The patient reports that she has not been sleeping well and only gets about 3 hours of sleep at night and has little is no sleep.  She reports that her appetite is good.   Testing Administered:  The patient was administered the Wechsler Adult Intelligence Scale-IV as well as the Wechsler Memory Scale-IV for older adults.  Participation Level:   Active  Participation Quality:  Appropriate and Attentive      Behavioral Observation:  Well Groomed, Alert, and Appropriate.   This specific administration of test was performed by Dr. Darol Destine and below you will find his behavioral observations derived during the administration of this test battery.  BEHAVIOR OBSERVATIONS: Patient was on time to her 1:00pm testing appointment. She was administered the Wechsler Adult Intelligence Scale, 4th Edition. The evaluation lasted around 90 minutes. Herparticipation was indicative of appropriate and redirectablebehaviors. She was appropriately dressed and well groomed. Shedisplayed an appropriatelevel of cooperation and motivation. Mood was slightly anxious but euthymic overall. Affect was appropriate and congruent with mood. She appeared somewhat critical of herself and made several self-deprecating remarks about her abilities. Thought processes appeared linear and goal directed. Next testing session scheduled for 10/24/15.    BEHAVIOR OBSERVATIONS: Patient was on time to her 1:00pm testing appointment. She was administered the Wechsler Memory Scale, 4th Edition, Older Adult Battery. The evaluation lasted around 90 minutes. Herparticipation was indicative of appropriate and redirectablebehaviors. She was appropriately dressed and  well groomed. Shedisplayed an appropriatelevel of cooperation and motivation. Mood was slightly anxious but  euthymic overall. Affect was appropriate and congruent with mood. She appeared somewhat critical of herself and made several self-deprecating remarks about her abilities. Thought processes appeared linear and goal directed.   Of note, patient expressed interest in participating in individual psychotherapy to develop compensatory strategies that might help with forgetfulness and innattention. She also expressed willingness to explore negative self talk and improve overall mood and wellbeing.      Test Results:     Composite Score Summary  Scale Sum of Scaled Scores Composite Score Percentile Rank 95% Conf. Interval Qualitative Description  Verbal Comprehension 32 VCI 103 58 97-109 Average  Perceptual Reasoning 28 PRI 96 39 90-102 Average  Working Memory 19 WMI 97 42 90-104 Average  Processing Speed 20 PSI 100 50 92-108 Average  Full Scale 99 FSIQ 99 47 95-103 Average  General Ability 60 GAI 100 50 95-105 Average    The patient produced a full scale IQ score of 99 which falls at the 47th percentile and is in the average range.  Given the patient's overall academic and occupational history this score is somewhat below what would be predicted but is still in the average range with no particular areas of deficits noted globally.  To adjust for some of the measures that can be affected most significantly by acute symptoms we also calculated the global abilities index.  The patient's global abilities index score was 100 which falls at the 50th percentile and also in the average range.  The patient produced a verbal comprehension score of 103 which falls at the 58th percentile and is in the average range.  The patient produced a perceptual reasoning index score of 96 which is at the 39th percentile and is also in the average range.  The patient's working memory was a 43 which falls at the 42nd percentile and is in the average range.  The patient's processing speed index score was 100 which falls at  the 50th percentile and is in the average range.   Verbal Comprehension Subtests Summary  Subtest Raw Score Scaled Score Percentile Rank Reference Group Scaled Score SEM  Similarities 25 11 63 10 0.95  Vocabulary 46 12 75 13 0.67  Information 13 9 37 10 0.73  (Comprehension) 33 16 98 16 1.27   Looking at the individual global index scores, the patient produced a verbal comprehension index score of 103 which falls at the 50th percentile and is in the average range.  This is slightly below what would be expected based on her educational occupational history but there were no indications of significant deficits.  Individual subtests that make up this measure showed significant consistency and subtest performance with the exception of her social judgment and comprehension index score which was significantly elevated relative to a population.  The patient produced average performances in the areas of verbal reasoning and problem-solving, general vocabulary and her general fund of information.  The patient had performance of falls in the superior range relative to her normative predictions with a scaled score of 16 which falls at the 98th percentile.  This is 1 of her best performances overall and suggest very good to superior abilities and social judgment and comprehension of social situations.   Perceptual Reasoning Subtests Summary  Subtest Raw Score Scaled Score Percentile Rank Reference Group Scaled Score SEM  Block Design 28 9 37 6 0.99  Matrix Reasoning 10 9 37 5 0.90  Visual Puzzles 11 10 50 7 0.99  (Picture Completion) 2 3 1 1  0.99    The patient produced a perceptual reasoning index score of 96 which falls in the average range and is at the 39th percentile.  There was very little variation or variability within subtest performance with the exception of her ability to identify anomalies within a gestalt.  This was clearly her worst performance but does suggest that this may be an anomaly  itself and as such we will not place too much weight on this low performance.  It does indicate that the patient likely has difficulty identifying visual anomalies and tends to look at and identify the overall gestalt of visual information and get locked into that type of pattern.  The patient's average performance was seen in her visual reasoning and problem-solving skills visual judgment and estimation, as well as visual analysis and organizational abilities.  The patient had good visual spatial abilities.   Working Doctor, general practice Raw Score Scaled Score Percentile Rank Reference Group Scaled Score SEM  Digit Span 22 9 37 7 0.73  Arithmetic 13 10 50 9 1.20   The patient produced a working memory index score of 97 which falls at the 42nd percentile and is in the average range.  Again, there was no variability with regard to her auditory encoding abilities/working memory abilities.  Her performance on the subtest of auditory encoding were consistent with her overall average performance throughout this measure.   Processing Speed Subtests Summary  Subtest Raw Score Scaled Score Percentile Rank Reference Group Scaled Score SEM  Symbol Search 21 9 37 6 1.12  Coding 55 11 63 7 1.12     The patient produced a processing speed index score of 100 which falls at the 50th percentile and is in the average range.  Again, subtest making up this measure showed great consistency and both fell within the average range.  The patient's ability to show good information processing speed, visual scanning and searching abilities was all in the average range with no indication of any deficits relative to her self or to a normative population.  The patient was able to effectively visual scanning and to focus execute task for both horizontal or visual scanning task.  Overall, the patient's performance on this broad range of cognitive measures showed very consistent and stable performance.  All of her  global index scores were in the average range with very little variability depression between index scores.  Almost all individual subtest were also consistent and in the average range with the exception of 2 measures that clearly stood out.  The patient showed significant abilities and skills with regard to her general comprehension and social judgment measures and there was a specific deficit on one individual visual task that requires the patient to identify visual anomalies within an overall gestalt.  This one measure is likely also an anomaly itself and given that other visual measures were all in the average range there is little interpretation that can be made from this individual subtest.  ABILITY-MEMORY ANALYSIS  Ability Score:  GAI: 100 Date of Testing:  WAIS-IV 2018/10/19; WMS-IV 2018/10/23  Predicted Difference Method   Index Predicted WMS-IV Index Score Actual WMS-IV Index Score Difference Critical Value  Significant Difference Y/N Base Rate  Auditory Memory 100 118 -18 10.41 Y 5-10%  Visual Memory 100 113 -13 7.89 Y 15%  Immediate Memory 100 117 -17 9.97 Y 5-10%  Delayed Memory 100 119 -19  12.33 Y 5-10%  Statistical significance (critical value) at the .01 level.    The patient was then administered the [place] for older adults.  In order to perform interpretation we utilize the patient's global abilities index score to create an estimation of where the patient should be performing on various memory index and memory measures.  The patient's global abilities index score 100 which falls at the 50th percentile for the normative population.  Using that number with this normative population a predicted score also was 100 on each of the individual index score.  The patient's immediate memory was 117 which was at the [place] percentile and in the high average range.  This suggest that the patient is able to easily take an information initially both visually and verbally  and give that information back.  With regard to auditory memory the patient produced an index score of 118 which falls at the 88th percentile and is in the high average range.  This is significantly above predicted levels of 100 and suggest good auditory memory and learning abilities.  The patient produced a visual memory index score of 113 which falls at the 81st percentile and is in the high average range.  This again was significantly above predicted levels relative to her global abilities index score.  The patient produced a delayed memory index score which combines both visual and auditory learning and looks at the ability to recall after a significant period of delay.  The patient produced a delayed memory index score of 119 which falls at the 90th percentile and is in the high average range.  This is significantly above her predicted levels based on her global abilities index score.  Overall, the patient's memory was quite good and significantly above her predicted levels based on her global abilities index score from her cognitive testing.  Her memory was also consistent with or higher than memory functions that would be estimated based on her education and occupational history.  There do not appear to be any objective findings or signs or symptoms of objective memory deficits.   Summary of Results:   Overall, the results of the broad range of various cognitive measures as well as objective assessment of memory and learning as well as attention and concentration suggest that the patient is actually doing quite well from an objective measurement perspective.  There were no indications of specific neuropsychological or cognitive deficits on a wide range of various domains.  The patient's verbal comprehension, visual-spatial abilities, working memory/attention and concentration, as well as overall information processing speed were all consistent with each other and in the average range with no areas  indicative of any cognitive deficits or difficulties.  The patient's memory function was quite good.  While her overall cognitive function was all in the average range and may be slightly below levels predicted based on her education and occupational history the patient's memory functions were all quite good and equal to or better than predicted levels based on her education and occupational history.  The patient performs very well with regard to attention and encoding abilities, immediate learning and memory abilities, individual aspects of both auditory and visual memory as well as doing very well on delayed memory.  In fact, her delayed memory (memory of information after a 20 to 40-minute delay of time) was quite good and was her highest score overall.  While the patient describes memory loss, word finding difficulties, and naming deficits along with other aspects of short-term  memory issues the patient's current objective measures were all quite good and there were no indications of any cognitive deficits.  However, the patient is quite distressed by this and it is likely that there are some very specific issues that may be leading to her increased difficulties from a functional standpoint.  The patient describes significant and prolonged sleep deprivation where she may only get 3 hours of sleep at night.  The patient has become aware of difficulties and has likely become hypervigilant of any areas or difficulties that she had.  However, prolonged and sustained sleep deprivation and is resulting effects on emotional functioning such as anxiety and depression and the correlation between sleep deprivation's and anxiety/depression are likely playing the primary role in her experiencing subjective symptoms of her memory difficulties that are specifically related to difficulty remembering people's names and issues such as that.  Given the fact that the patient's social judgment and comprehension abilities are so  superior and have always been 1 of her greatest strengths the current difficulties that she experiences that are likely related to anxiety/depression and chronic sleep deprivation are easily noted by the patient.  The patient's ability historically to remember people's names and deep aspects about their social life and situation has always been 1 of her greatest strengths and clearly superior within abilities in this area compared to others around her.  This is likely 1 of the aspects that she is always benefited from and been quite aware of the strengths.  Functional issues such as anxiety and depression, becoming hypervigilant of difficulties directly related to her sustained and prolonged sleep disturbance are likely the primary etiological factors here.  The patient has voiced her interest in looking at therapeutic interventions around her difficulties and I highly recommend this approach.  The patient would likely greatly benefit from therapeutic interventions such as cognitive behavioral therapy as well as looking at opportunities for possible psychotropic interventions such as an SSRI.  The patient is not showing any indications of degenerative dementia or cognitive decline.  However, she is experiencing valid difficulties that are likely directly related to her prolonged and sustained sleep deprivation.  This issue should also be taken seriously and addressed.  I will provide feedback to the patient with recommendations about therapeutic interventions for these functional issues that are causing such difficulty for the patient but also the fact that there is no indication of any neurological or progressive dementia types of processes at play here.  Diagnosis:    Axis I: Sleep deprivation  Anxiety state   Ilean Skill, Psy.D. Neuropsychologist      WAIS-IV Wechsler Adult Intelligence Scale-Fourth Edition Score Report   Examinee Name Emersen Mascari  Date of Report 2018/10/27    Justice Rocher [id number]  Years of Education   Date of Birth 11-Jan-1946  Primary Language   Gender Female  Handedness   Race/Ethnicity   Examiner Name Ilean Skill  Date of Testing 2018/10/19  Age at Testing 17 years 8 months  Retest? No   Comments: Composite Score Summary  Scale Sum of Scaled Scores Composite Score Percentile Rank 95% Conf. Interval Qualitative Description  Verbal Comprehension 32 VCI 103 58 97-109 Average  Perceptual Reasoning 28 PRI 96 39 90-102 Average  Working Memory 19 WMI 97 42 90-104 Average  Processing Speed 20 PSI 100 50 92-108 Average  Full Scale 99 FSIQ 99 47 95-103 Average  General Ability 60 GAI 100 50 95-105 Average  Confidence Intervals are based on the Overall Average SEMs.  The [facility] is an optional composite summary score that is less sensitive to the influence of working memory and processing speed. Because working memory and processing speed are vital to a comprehensive evaluation of cognitive ability, it should be noted that the [facility] does not have the breadth of construct coverage as the FSIQ. Note. The vertical bars represent the standard error of measurement (SEM). SEM values are based on the examinee's age.   ANALYSIS   Index Level Discrepancy Comparisons  Comparison Score 1 Score 2 Difference Critical Value .05 Significant Difference Y/N Base Rate by Overall Sample  VCI - PRI 103 96 7 8.31 N 30.5  VCI - WMI 103 97 6 9.30 N 32.8  VCI - PSI 103 100 3 10.19 N 43.6  PRI - WMI 96 97 -1 10.18 N 48.0  PRI - PSI 96 100 -4 11.00 N 42.0  WMI - PSI 97 100 -3 11.76 N 44.2  FSIQ - GAI 99 100 -1 3.61 N 44.9  Base Rate by Overall Sample. Statistical significance (critical value) at the .05 level.  Verbal Comprehension Subtests Summary  Subtest Raw Score Scaled Score Percentile Rank Reference Group Scaled Score SEM  Similarities 25 11 63 10 0.95  Vocabulary 46 12 75 13 0.67  Information 13 9 37 10 0.73  (Comprehension) 33 16 98 16 1.27    Perceptual Reasoning Subtests Summary  Subtest Raw Score Scaled Score Percentile Rank Reference Group Scaled Score SEM  Block Design 28 9 37 6 0.99  Matrix Reasoning 10 9 37 5 0.90  Visual Puzzles 11 10 50 7 0.99  (Picture Completion) 2 3 1 1  0.99   Working Doctor, general practice Raw Score Scaled Score Percentile Rank Reference Group Scaled Score SEM  Digit Span 22 9 37 7 0.73  Arithmetic 13 10 50 9 1.20   Processing Speed Subtests Summary  Subtest Raw Score Scaled Score Percentile Rank Reference Group Scaled Score SEM  Symbol Search 21 9 37 6 1.12  Coding 55 11 63 7 1.12   Subtest Level Discrepancy Comparisons  Subtest Comparison Score 1 Score 2 Difference Critical Value .05 Significant Difference Y/N Base Rate  Digit Span - Arithmetic 9 10 -1 2.57 N 42.20  Symbol Search - Coding 9 11 -2 3.41 N 25.70  Statistical significance (critical value) at the .05 level.   DETERMINING STRENGTHS AND WEAKNESSES   Differences Between Subtest and Overall Mean of Subtest Scores  Subtest Subtest Scaled Score Mean Scaled Score Difference Critical Value .05 Strength or Weakness Base Rate  Block Design 9 9.90 -0.90 2.85  >25%  Similarities 11 9.90 1.10 2.82  >25%  Digit Span 9 9.90 -0.90 2.22  >25%  Matrix Reasoning 9 9.90 -0.90 2.54  >25%  Vocabulary 12 9.90 2.10 2.03 S 25%  Arithmetic 10 9.90 0.10 2.73  >25%  Symbol Search 9 9.90 -0.90 3.42  >25%  Visual Puzzles 10 9.90 0.10 2.71  >25%  Information 9 9.90 -0.90 2.19  >25%  Coding 11 9.90 1.10 2.97  >25%  verall: Mean = 9.90, Scatter =  3, Base rate =  99.7 Base Rate for Intersubtest Scatter is reported for 10 Subtests. Statistical significance (critical value) at the .05 level.   PROCESS ANALYSIS   Perceptual Reasoning Process Score Summary  Process Score Raw Score Scaled Score Percentile Rank SEM  Block Design No Time Bonus 28 9 37 1.04    Working Memory Process Score Summary  Process Score Raw Score  Scaled  Score Percentile Rank Base Rate SEM  Digit Span Forward 8 8 25  -- 1.04  Digit Span Backward 8 10 50 -- 1.37  Digit Span Sequencing 6 9 37 -- 1.12  Longest Digit Span Forward 5 -- -- 90.0 --  Longest Digit Span Backward 4 -- -- 74.0 --  Longest Digit Span Sequence 5 -- -- 74.0 --    Process Level Discrepancy Comparisons  Process Comparison Score 1 Score 2 Difference Critical Value .05 Significant Difference Y/N Base Rate  Block Design - Block Design No Time Bonus 9 9 0 3.08 N   Digit Span Forward - Digit Span Backward 8 10 -2 3.65 N 31.5  Digit Span Forward - Digit Span Sequencing 8 9 -1 3.60 N 45.2  Digit Span Backward - Digit Span Sequencing 10 9 1  3.56 N 43.0  Longest Digit Span Forward - Longest Digit Span Backward 5 4 1  -- -- 85.0  Longest Digit Span Forward - Longest Digit Span Sequence 5 5 0 -- --   Longest Digit Span Backward - Longest Digit Span Sequence 4 5 -1 -- -- 62.0  Statistical significance (critical value) at the .05 level.   Raw Scores  Subtest Score Range Raw Score  Process Score Range Raw Score  Block Design 0-66 28  Block Design No Time Bonus 0-48 28  Similarities 0-36 25  Digit Span Forward 0-16 8  Digit Span 0-48 22  Digit Span Backward 0-16 8  Matrix Reasoning 0-26 10  Digit Span Sequencing 0-16 6  Vocabulary 0-57 46  Longest Digit Span Forward 0, 2-9 5  Arithmetic 0-22 13  Longest Digit Span Backward 0, 2-8 4  Symbol Search 0-60 21  Longest Digit Span Sequence 0, 2-9 5  Visual Puzzles 0-26 11  Longest Letter-Number Seq. 0, 2-8   Information 0-26 13      Coding 0-135 55      Letter-Number Seq. 0-30       Figure Weights 0-27       Comprehension 0-36 33      Cancellation 0-72       Picture Completion 0-24 2              WMS-IV Wechsler Memory Scale-Fourth Edition Score Report   Examinee Name Fallen Crisostomo  Date of Report 2018/10/27  Kingsley Spittle ID [id number]  Years of Education 39  Date of Birth 03/28/1946  Home Language    Gender Female  Handedness Not Specified  Race/Ethnicity   Examiner Name Ilean Skill  Date of Testing 2018/10/23  Age at Testing 66 years 8 months  Retest? No   Comments: Brief Cognitive Status Exam Classification  Age Years of Education Raw Score Classification Level Base Rate  72 years 8 months 16 46 Borderline 12.3    Index Score Summary  Index Sum of Scaled Scores Index Score Percentile Rank 95% Confidence Interval Qualitative Descriptor  Auditory Memory (AMI) 52 118 88 111-123 High Average  Visual Memory (VMI) 25 113 81 108-118 High Average  Immediate Memory (IMI) 38 117 87 110-122 High Average  Delayed Memory (DMI) 39 119 90 110-125 High Average    Index Score ProfileThe vertical bars represent the standard error of measurement (SEM).   Primary Subtest Scaled Score Summary  Subtest Domain Raw Score Scaled Score Percentile Rank  Logical Memory I AM 39 13 84  Logical Memory II AM 25 13 84  Verbal [facility] AM 28 13 84  Verbal [facility] II AM 8 13 84  Visual Reproduction I VM 34 12 75  Visual Reproduction II VM 26 13 84  Symbol Span VWM 22 12 75   Primary Subtest Scaled Score Profile  PROCESS SCORE CONVERSIONS  Auditory Memory Process Score Summary  Process Score Raw Score Scaled Score Percentile Rank Cumulative Percentage (Base Rate)  LM II Recognition 21 - - >75%  VPA II Recognition 30 - - >75%   Visual Memory Process Score Summary  Process Score Raw Score Scaled Score Percentile Rank Cumulative Percentage (Base Rate)  VR II Recognition 6 - - >75%    SUBTEST-LEVEL DIFFERENCES WITHIN INDEXES  Auditory Memory Index  Subtest Scaled Score AMI Mean Score Difference from Mean Critical Value Base Rate  Logical Memory I 13 13.00 0.00 2.45 >25%  Logical Memory II 13 13.00 0.00 2.38 >25%  Verbal [facility] I 13 13.00 0.00 2.04 >25%  Verbal [facility] II 13 13.00 0.00 3.06 >25%  Statistical significance (critical value) at the  .05 level.   Immediate Memory Index  Subtest Scaled Score IMI Mean Score Difference from Mean Critical Value Base Rate  Logical Memory I 13 12.67 0.33 2.01 >25%  Verbal [facility] I 13 12.67 0.33 1.71 >25%  Visual Reproduction I 12 12.67 -0.67 1.71 >25%  Statistical significance (critical value) at the .05 level.   Delayed Memory Index  Subtest Scaled Score DMI Mean Score Difference from Mean Critical Value Base Rate  Logical Memory II 13 13.00 0.00 2.15 >25%  Verbal [facility] II 13 13.00 0.00 2.63 >25%  Visual Reproduction II 13 13.00 0.00 1.77 >25%  Statistical significance (critical value) at the .05 level.    SUBTEST DISCREPANCY COMPARISON  Comparison Score 1 Score 2 Difference Critical Value Base Rate  Visual Reproduction I - Visual Reproduction II 12 13 -1 1.99 81.0  Statistical significance (critical value) at the .05 level.    SUBTEST-LEVEL CONTRAST SCALED SCORES  Logical Memory  Score Score 1 Score 2 Contrast Scaled Score  LM II Recognition vs. Delayed Recall >75% 13 11  LM Immediate Recall vs. Delayed Recall 13 13 11    [facility]bal [facility]  Score Score 1 Score 2 Contrast Scaled Score  VPA II Recognition vs. Delayed Recall >75% 13 12  VPA Immediate Recall vs. Delayed Recall 13 13 10    Visual Reproduction  Score Score 1 Score 2 Contrast Scaled Score  VR II Recognition vs. Delayed Recall >75% 13 12  VR Immediate Recall vs. Delayed Recall 12 13 12     INDEX-LEVEL CONTRAST SCALED SCORES  WMS-IV Indexes  Score Score 1 Score 2 Contrast Scaled Score  Auditory Memory Index vs. Visual Memory Index 118 113 12  Immediate Memory Index vs. Delayed Memory Index 117 119 11    RAW SCORES  Subtest Score Range Adult Score Range Older Adult Raw Score  Brief Cognitive Status Exam 0-58 0-58 46  Logical Memory I 0-50 0-53 39  Logical Memory II 0-50 0-39 25  Verbal [facility] I 0-56 0-40 28  Verbal [facility] II 0-14 0-10 8  CVLT-II  Trials 1-5 5-95 5-95   CVLT-II Long-Delay -5-5 -5-5   Designs I 0-120    Designs II 0-120    Visual Reproduction I 0-43 0-43 34  Visual Reproduction II 0-43 0-43 26  Spatial Addition 0-24    Symbol Span 0-50 0-50 22  Process Score Range Adult Score Range Older Adult Raw Score  LM II Recognition 0-30 0-23 21  VPA II Recognition 0-40 0-30 30  VPA  II Word Recall 0-28 0-20   DE I Content 0-48    DE I Spatial 0-24    DE II Content 0-48    DE II Spatial 0-24    DE II Recognition 0-24    VR II Recognition 0-7 0-7 6  VR II Copy 0-43 0-43     ABILITY-MEMORY ANALYSIS  Ability Score:  GAI: 100 Date of Testing:  WAIS-IV 2018/10/19; WMS-IV 2018/10/23  Predicted Difference Method   Index Predicted WMS-IV Index Score Actual WMS-IV Index Score Difference Critical Value  Significant Difference Y/N Base Rate  Auditory Memory 100 118 -18 10.41 Y 5-10%  Visual Memory 100 113 -13 7.89 Y 15%  Immediate Memory 100 117 -17 9.97 Y 5-10%  Delayed Memory 100 119 -19 12.33 Y 5-10%  Statistical significance (critical value) at the .01 level.   Contrast Scaled Scores  Score Score 1 Score 2 Contrast Scaled Score  General Ability Index vs. Auditory Memory Index 100 118 14  General Ability Index vs. Visual Memory Index 100 113 13  General Ability Index vs. Immediate Memory Index 100 117 14  General Ability Index vs. Delayed Memory Index 100 119 15  Verbal Comprehension Index vs. Auditory Memory Index 103 118 14  Perceptual Reasoning Index vs. Visual Memory Index 96 113 14  Working Memory Index vs. Auditory Memory Index 97 118 15    End of Report

## 2018-10-28 DIAGNOSIS — E782 Mixed hyperlipidemia: Secondary | ICD-10-CM | POA: Diagnosis not present

## 2018-10-28 DIAGNOSIS — I10 Essential (primary) hypertension: Secondary | ICD-10-CM | POA: Diagnosis not present

## 2018-10-28 DIAGNOSIS — L659 Nonscarring hair loss, unspecified: Secondary | ICD-10-CM | POA: Diagnosis not present

## 2018-10-28 DIAGNOSIS — R7309 Other abnormal glucose: Secondary | ICD-10-CM | POA: Diagnosis not present

## 2018-11-03 ENCOUNTER — Encounter: Payer: PPO | Admitting: Psychology

## 2018-11-03 DIAGNOSIS — F411 Generalized anxiety disorder: Secondary | ICD-10-CM

## 2018-11-03 DIAGNOSIS — R413 Other amnesia: Secondary | ICD-10-CM | POA: Diagnosis not present

## 2018-11-03 DIAGNOSIS — Z72820 Sleep deprivation: Secondary | ICD-10-CM

## 2018-11-05 ENCOUNTER — Encounter: Payer: Self-pay | Admitting: Psychology

## 2018-11-05 NOTE — Progress Notes (Signed)
Today I provided feedback regarding the results of the current neuropsychological evaluation.  The patient did very well as far as objective testing of memory as well as a wide range of other cognitive functioning's.  The results of the current neuropsychological evaluation are not consistent with any indications of aggressive dementias or significant indications of cognitive deficits that would not easily be explained by issues related to chronic sleep deprivation and anxiety.  I do think that the patient would greatly benefit on issues related to trying to normalize and improve her sleep hygiene and sleep patterns.  This should be the primary focus for work with the patient as she is chronically getting around 3 hours of sleep each night.  Below is a copy of the summary and results sections from the formal report that can be found in the patient's chart dated 10/27/2018.  Summary of Results:                                    Overall, the results of the broad range of various cognitive measures as well as objective assessment of memory and learning as well as attention and concentration suggest that the patient is actually doing quite well from an objective measurement perspective.  There were no indications of specific neuropsychological or cognitive deficits on a wide range of various domains.  The patient's verbal comprehension, visual-spatial abilities, working memory/attention and concentration, as well as overall information processing speed were all consistent with each other and in the average range with no areas indicative of any cognitive deficits or difficulties.  The patient's memory function was quite good.  While her overall cognitive function was all in the average range and may be slightly below levels predicted based on her education and occupational history the patient's memory functions were all quite good and equal to or better than predicted levels based on her education and occupational  history.  The patient performs very well with regard to attention and encoding abilities, immediate learning and memory abilities, individual aspects of both auditory and visual memory as well as doing very well on delayed memory.  In fact, her delayed memory (memory of information after a 20 to 40-minute delay of time) was quite good and was her highest score overall.  While the patient describes memory loss, word finding difficulties, and naming deficits along with other aspects of short-term memory issues the patient's current objective measures were all quite good and there were no indications of any cognitive deficits.  However, the patient is quite distressed by this and it is likely that there are some very specific issues that may be leading to her increased difficulties from a functional standpoint.  The patient describes significant and prolonged sleep deprivation where she may only get 3 hours of sleep at night.  The patient has become aware of difficulties and has likely become hypervigilant of any areas or difficulties that she had.  However, prolonged and sustained sleep deprivation and is resulting effects on emotional functioning such as anxiety and depression and the correlation between sleep deprivation's and anxiety/depression are likely playing the primary role in her experiencing subjective symptoms of her memory difficulties that are specifically related to difficulty remembering people's names and issues such as that.  Given the fact that the patient's social judgment and comprehension abilities are so superior and have always been 1 of her greatest strengths the current difficulties that she experiences  that are likely related to anxiety/depression and chronic sleep deprivation are easily noted by the patient.  The patient's ability historically to remember people's names and deep aspects about their social life and situation has always been 1 of her greatest strengths and clearly superior  within abilities in this area compared to others around her.  This is likely 1 of the aspects that she is always benefited from and been quite aware of the strengths.  Functional issues such as anxiety and depression, becoming hypervigilant of difficulties directly related to her sustained and prolonged sleep disturbance are likely the primary etiological factors here.  The patient has voiced her interest in looking at therapeutic interventions around her difficulties and I highly recommend this approach.  The patient would likely greatly benefit from therapeutic interventions such as cognitive behavioral therapy as well as looking at opportunities for possible psychotropic interventions such as an SSRI.  The patient is not showing any indications of degenerative dementia or cognitive decline.  However, she is experiencing valid difficulties that are likely directly related to her prolonged and sustained sleep deprivation.  This issue should also be taken seriously and addressed.  I will provide feedback to the patient with recommendations about therapeutic interventions for these functional issues that are causing such difficulty for the patient but also the fact that there is no indication of any neurological or progressive dementia types of processes at play here.  Diagnosis:                    Axis I: Sleep deprivation  Anxiety state   Ilean Skill, Psy.D. Neuropsychologist

## 2018-12-08 ENCOUNTER — Other Ambulatory Visit: Payer: Self-pay

## 2018-12-08 ENCOUNTER — Encounter: Payer: PPO | Admitting: Psychology

## 2018-12-08 ENCOUNTER — Telehealth: Payer: Self-pay | Admitting: Psychology

## 2018-12-08 DIAGNOSIS — R413 Other amnesia: Secondary | ICD-10-CM | POA: Insufficient documentation

## 2018-12-08 DIAGNOSIS — F411 Generalized anxiety disorder: Secondary | ICD-10-CM | POA: Insufficient documentation

## 2018-12-08 DIAGNOSIS — F801 Expressive language disorder: Secondary | ICD-10-CM | POA: Insufficient documentation

## 2018-12-08 DIAGNOSIS — Z72820 Sleep deprivation: Secondary | ICD-10-CM | POA: Insufficient documentation

## 2018-12-08 NOTE — Telephone Encounter (Signed)
Patient is upset that she was charged the copay for her hour of interpretation for Neuro psych - in February. Question arose because she was over charged a $10 copay and they are applying it to the old outstanding charge.   She stated it is illegal to charge someone for a visit they are not present for - I advised that just like xray/mri - a radiologist has to interpret the data and bill - even though they never meet.  She states someone should have explained that.  Sherry front desk and myself both explained that we don't know what was explained when she booked appointments other than she is advised she has an appointment she is not present for, she was aware of that- Psychometrician does four hours of testing - data goes to Neuropsychologist who does 15 page report - that hours appointment without patient present is the hour charge in question - She was unhappy that she was mislead and she states she will file complaint.  As I went to see Neuropsych to ask attempt with him for service recovery-she refused to see the Neuropsychologist and walked out on appointment stating you better not charge me for this.

## 2019-04-12 ENCOUNTER — Other Ambulatory Visit: Payer: Self-pay

## 2019-04-23 DIAGNOSIS — E782 Mixed hyperlipidemia: Secondary | ICD-10-CM | POA: Diagnosis not present

## 2019-04-23 DIAGNOSIS — I10 Essential (primary) hypertension: Secondary | ICD-10-CM | POA: Diagnosis not present

## 2019-04-23 DIAGNOSIS — J441 Chronic obstructive pulmonary disease with (acute) exacerbation: Secondary | ICD-10-CM | POA: Diagnosis not present

## 2019-07-16 DIAGNOSIS — F064 Anxiety disorder due to known physiological condition: Secondary | ICD-10-CM | POA: Diagnosis not present

## 2019-07-16 DIAGNOSIS — R519 Headache, unspecified: Secondary | ICD-10-CM | POA: Diagnosis not present

## 2019-07-16 DIAGNOSIS — I10 Essential (primary) hypertension: Secondary | ICD-10-CM | POA: Diagnosis not present

## 2019-07-16 DIAGNOSIS — E782 Mixed hyperlipidemia: Secondary | ICD-10-CM | POA: Diagnosis not present

## 2019-07-16 DIAGNOSIS — E559 Vitamin D deficiency, unspecified: Secondary | ICD-10-CM | POA: Diagnosis not present

## 2019-07-16 DIAGNOSIS — R7309 Other abnormal glucose: Secondary | ICD-10-CM | POA: Diagnosis not present

## 2019-07-16 DIAGNOSIS — R42 Dizziness and giddiness: Secondary | ICD-10-CM | POA: Diagnosis not present

## 2019-07-19 DIAGNOSIS — I10 Essential (primary) hypertension: Secondary | ICD-10-CM | POA: Diagnosis not present

## 2019-07-19 DIAGNOSIS — R7309 Other abnormal glucose: Secondary | ICD-10-CM | POA: Diagnosis not present

## 2019-07-19 DIAGNOSIS — E785 Hyperlipidemia, unspecified: Secondary | ICD-10-CM | POA: Diagnosis not present

## 2019-07-19 DIAGNOSIS — E782 Mixed hyperlipidemia: Secondary | ICD-10-CM | POA: Diagnosis not present

## 2019-08-02 DIAGNOSIS — E876 Hypokalemia: Secondary | ICD-10-CM | POA: Diagnosis not present

## 2019-08-03 DIAGNOSIS — I10 Essential (primary) hypertension: Secondary | ICD-10-CM | POA: Diagnosis not present

## 2019-08-03 DIAGNOSIS — E785 Hyperlipidemia, unspecified: Secondary | ICD-10-CM | POA: Diagnosis not present

## 2019-08-03 DIAGNOSIS — Z0000 Encounter for general adult medical examination without abnormal findings: Secondary | ICD-10-CM | POA: Diagnosis not present

## 2019-08-04 DIAGNOSIS — Z780 Asymptomatic menopausal state: Secondary | ICD-10-CM | POA: Diagnosis not present

## 2019-08-04 DIAGNOSIS — M810 Age-related osteoporosis without current pathological fracture: Secondary | ICD-10-CM | POA: Diagnosis not present

## 2019-08-10 ENCOUNTER — Other Ambulatory Visit: Payer: Self-pay

## 2019-08-10 DIAGNOSIS — Z20822 Contact with and (suspected) exposure to covid-19: Secondary | ICD-10-CM

## 2019-08-12 LAB — NOVEL CORONAVIRUS, NAA: SARS-CoV-2, NAA: NOT DETECTED

## 2019-08-19 DIAGNOSIS — F064 Anxiety disorder due to known physiological condition: Secondary | ICD-10-CM | POA: Diagnosis not present

## 2019-08-19 DIAGNOSIS — E785 Hyperlipidemia, unspecified: Secondary | ICD-10-CM | POA: Diagnosis not present

## 2019-08-19 DIAGNOSIS — I10 Essential (primary) hypertension: Secondary | ICD-10-CM | POA: Diagnosis not present

## 2019-08-19 DIAGNOSIS — R42 Dizziness and giddiness: Secondary | ICD-10-CM | POA: Diagnosis not present

## 2019-09-21 DIAGNOSIS — I10 Essential (primary) hypertension: Secondary | ICD-10-CM | POA: Diagnosis not present

## 2019-09-23 ENCOUNTER — Other Ambulatory Visit: Payer: Self-pay | Admitting: *Deleted

## 2019-09-23 ENCOUNTER — Telehealth: Payer: Self-pay | Admitting: *Deleted

## 2019-09-23 DIAGNOSIS — R002 Palpitations: Secondary | ICD-10-CM

## 2019-09-23 NOTE — Telephone Encounter (Signed)
Patient enrolled for [employer] to ship a 30 day cardiac event monitor to her home.  Instructions included in the monitor kit. 

## 2019-10-04 ENCOUNTER — Ambulatory Visit (INDEPENDENT_AMBULATORY_CARE_PROVIDER_SITE_OTHER): Payer: PPO

## 2019-10-04 ENCOUNTER — Encounter: Payer: Self-pay | Admitting: Family Medicine

## 2019-10-04 DIAGNOSIS — R002 Palpitations: Secondary | ICD-10-CM

## 2019-10-05 DIAGNOSIS — R42 Dizziness and giddiness: Secondary | ICD-10-CM | POA: Diagnosis not present

## 2019-10-05 DIAGNOSIS — R0602 Shortness of breath: Secondary | ICD-10-CM | POA: Diagnosis not present

## 2019-10-05 DIAGNOSIS — R457 State of emotional shock and stress, unspecified: Secondary | ICD-10-CM | POA: Diagnosis not present

## 2019-10-05 DIAGNOSIS — I10 Essential (primary) hypertension: Secondary | ICD-10-CM | POA: Diagnosis not present

## 2019-10-08 ENCOUNTER — Encounter: Payer: Self-pay | Admitting: Family Medicine

## 2019-11-04 DIAGNOSIS — E785 Hyperlipidemia, unspecified: Secondary | ICD-10-CM | POA: Diagnosis not present

## 2019-11-04 DIAGNOSIS — L659 Nonscarring hair loss, unspecified: Secondary | ICD-10-CM | POA: Diagnosis not present

## 2019-11-04 DIAGNOSIS — F410 Panic disorder [episodic paroxysmal anxiety] without agoraphobia: Secondary | ICD-10-CM | POA: Diagnosis not present

## 2019-11-04 DIAGNOSIS — R7309 Other abnormal glucose: Secondary | ICD-10-CM | POA: Diagnosis not present

## 2019-11-04 DIAGNOSIS — E782 Mixed hyperlipidemia: Secondary | ICD-10-CM | POA: Diagnosis not present

## 2019-11-04 DIAGNOSIS — I10 Essential (primary) hypertension: Secondary | ICD-10-CM | POA: Diagnosis not present

## 2019-11-19 DIAGNOSIS — M816 Localized osteoporosis [Lequesne]: Secondary | ICD-10-CM | POA: Diagnosis not present

## 2019-11-19 DIAGNOSIS — I10 Essential (primary) hypertension: Secondary | ICD-10-CM | POA: Diagnosis not present

## 2019-11-19 DIAGNOSIS — F064 Anxiety disorder due to known physiological condition: Secondary | ICD-10-CM | POA: Diagnosis not present

## 2019-12-20 ENCOUNTER — Encounter (HOSPITAL_COMMUNITY): Payer: Self-pay | Admitting: *Deleted

## 2019-12-20 ENCOUNTER — Observation Stay (HOSPITAL_COMMUNITY)
Admission: EM | Admit: 2019-12-20 | Discharge: 2019-12-22 | Disposition: A | Discharge status: Home / Self Care | Payer: PPO | Attending: Family Medicine | Admitting: Family Medicine

## 2019-12-20 ENCOUNTER — Other Ambulatory Visit: Payer: Self-pay

## 2019-12-20 DIAGNOSIS — K5730 Diverticulosis of large intestine without perforation or abscess without bleeding: Secondary | ICD-10-CM | POA: Diagnosis not present

## 2019-12-20 DIAGNOSIS — Z888 Allergy status to other drugs, medicaments and biological substances status: Secondary | ICD-10-CM | POA: Insufficient documentation

## 2019-12-20 DIAGNOSIS — D638 Anemia in other chronic diseases classified elsewhere: Secondary | ICD-10-CM | POA: Diagnosis not present

## 2019-12-20 DIAGNOSIS — N281 Cyst of kidney, acquired: Secondary | ICD-10-CM | POA: Diagnosis not present

## 2019-12-20 DIAGNOSIS — R2689 Other abnormalities of gait and mobility: Secondary | ICD-10-CM | POA: Diagnosis not present

## 2019-12-20 DIAGNOSIS — I671 Cerebral aneurysm, nonruptured: Secondary | ICD-10-CM | POA: Diagnosis not present

## 2019-12-20 DIAGNOSIS — I700 Atherosclerosis of aorta: Secondary | ICD-10-CM | POA: Diagnosis not present

## 2019-12-20 DIAGNOSIS — R42 Dizziness and giddiness: Secondary | ICD-10-CM | POA: Diagnosis not present

## 2019-12-20 DIAGNOSIS — Z884 Allergy status to anesthetic agent status: Secondary | ICD-10-CM | POA: Insufficient documentation

## 2019-12-20 DIAGNOSIS — I6782 Cerebral ischemia: Secondary | ICD-10-CM | POA: Insufficient documentation

## 2019-12-20 DIAGNOSIS — N289 Disorder of kidney and ureter, unspecified: Secondary | ICD-10-CM

## 2019-12-20 DIAGNOSIS — I160 Hypertensive urgency: Secondary | ICD-10-CM | POA: Diagnosis present

## 2019-12-20 DIAGNOSIS — Z8249 Family history of ischemic heart disease and other diseases of the circulatory system: Secondary | ICD-10-CM | POA: Diagnosis not present

## 2019-12-20 DIAGNOSIS — I10 Essential (primary) hypertension: Secondary | ICD-10-CM | POA: Diagnosis not present

## 2019-12-20 DIAGNOSIS — M8580 Other specified disorders of bone density and structure, unspecified site: Secondary | ICD-10-CM | POA: Insufficient documentation

## 2019-12-20 DIAGNOSIS — E876 Hypokalemia: Secondary | ICD-10-CM | POA: Diagnosis present

## 2019-12-20 DIAGNOSIS — R5383 Other fatigue: Secondary | ICD-10-CM | POA: Insufficient documentation

## 2019-12-20 DIAGNOSIS — R634 Abnormal weight loss: Secondary | ICD-10-CM | POA: Insufficient documentation

## 2019-12-20 DIAGNOSIS — Z886 Allergy status to analgesic agent status: Secondary | ICD-10-CM | POA: Insufficient documentation

## 2019-12-20 DIAGNOSIS — R55 Syncope and collapse: Secondary | ICD-10-CM

## 2019-12-20 DIAGNOSIS — E7800 Pure hypercholesterolemia, unspecified: Secondary | ICD-10-CM | POA: Diagnosis not present

## 2019-12-20 DIAGNOSIS — Z8639 Personal history of other endocrine, nutritional and metabolic disease: Secondary | ICD-10-CM | POA: Diagnosis not present

## 2019-12-20 DIAGNOSIS — E785 Hyperlipidemia, unspecified: Secondary | ICD-10-CM | POA: Insufficient documentation

## 2019-12-20 DIAGNOSIS — Z20822 Contact with and (suspected) exposure to covid-19: Secondary | ICD-10-CM | POA: Insufficient documentation

## 2019-12-20 DIAGNOSIS — Z79899 Other long term (current) drug therapy: Secondary | ICD-10-CM | POA: Diagnosis not present

## 2019-12-20 DIAGNOSIS — K862 Cyst of pancreas: Secondary | ICD-10-CM | POA: Insufficient documentation

## 2019-12-20 LAB — CBC
HCT: 31.9 % — ABNORMAL LOW (ref 36.0–46.0)
Hemoglobin: 10.8 g/dL — ABNORMAL LOW (ref 12.0–15.0)
MCH: 28.4 pg (ref 26.0–34.0)
MCHC: 33.9 g/dL (ref 30.0–36.0)
MCV: 83.9 fL (ref 80.0–100.0)
Platelets: 196 10*3/uL (ref 150–400)
RBC: 3.8 MIL/uL — ABNORMAL LOW (ref 3.87–5.11)
RDW: 14.4 % (ref 11.5–15.5)
WBC: 7.9 10*3/uL (ref 4.0–10.5)
nRBC: 0 % (ref 0.0–0.2)

## 2019-12-20 LAB — BASIC METABOLIC PANEL
Anion gap: 10 (ref 5–15)
BUN: 12 mg/dL (ref 8–23)
CO2: 26 mmol/L (ref 22–32)
Calcium: 9.2 mg/dL (ref 8.9–10.3)
Chloride: 101 mmol/L (ref 98–111)
Creatinine, Ser: 1.03 mg/dL — ABNORMAL HIGH (ref 0.44–1.00)
GFR calc Af Amer: >60 mL/min (ref >60)
GFR calc non Af Amer: 55 mL/min — ABNORMAL LOW (ref >60)
Glucose, Bld: 110 mg/dL — ABNORMAL HIGH (ref 70–99)
Potassium: 3.4 mmol/L — ABNORMAL LOW (ref 3.5–5.1)
Sodium: 137 mmol/L (ref 135–145)

## 2019-12-20 LAB — URINALYSIS, ROUTINE W REFLEX MICROSCOPIC
Bilirubin Urine: NEGATIVE
Glucose, UA: NEGATIVE mg/dL
Hgb urine dipstick: NEGATIVE
Ketones, ur: NEGATIVE mg/dL
Leukocytes,Ua: NEGATIVE
Nitrite: NEGATIVE
Protein, ur: NEGATIVE mg/dL
Specific Gravity, Urine: 1.008 (ref 1.005–1.030)
pH: 6 (ref 5.0–8.0)

## 2019-12-20 LAB — CBG MONITORING, ED: Glucose-Capillary: 110 mg/dL — ABNORMAL HIGH (ref 70–99)

## 2019-12-20 MED ORDER — SODIUM CHLORIDE 0.9% FLUSH
3.0000 mL | Freq: Once | INTRAVENOUS | Status: AC
  Administered 2019-12-21: 09:00:00 3 mL via INTRAVENOUS

## 2019-12-20 NOTE — ED Triage Notes (Signed)
Pt reports that for over three weeks she has had nausea, dizziness, feels like she is going to pass out, headache, and having trouble remembering things.

## 2019-12-21 ENCOUNTER — Emergency Department (HOSPITAL_COMMUNITY): Payer: PPO

## 2019-12-21 ENCOUNTER — Observation Stay (HOSPITAL_COMMUNITY): Payer: PPO

## 2019-12-21 DIAGNOSIS — R55 Syncope and collapse: Secondary | ICD-10-CM

## 2019-12-21 DIAGNOSIS — E876 Hypokalemia: Secondary | ICD-10-CM

## 2019-12-21 DIAGNOSIS — Z8639 Personal history of other endocrine, nutritional and metabolic disease: Secondary | ICD-10-CM | POA: Diagnosis not present

## 2019-12-21 DIAGNOSIS — I160 Hypertensive urgency: Secondary | ICD-10-CM | POA: Diagnosis present

## 2019-12-21 DIAGNOSIS — R634 Abnormal weight loss: Secondary | ICD-10-CM | POA: Diagnosis present

## 2019-12-21 DIAGNOSIS — K862 Cyst of pancreas: Secondary | ICD-10-CM | POA: Diagnosis not present

## 2019-12-21 DIAGNOSIS — Z20822 Contact with and (suspected) exposure to covid-19: Secondary | ICD-10-CM | POA: Diagnosis not present

## 2019-12-21 DIAGNOSIS — N289 Disorder of kidney and ureter, unspecified: Secondary | ICD-10-CM

## 2019-12-21 DIAGNOSIS — K5730 Diverticulosis of large intestine without perforation or abscess without bleeding: Secondary | ICD-10-CM | POA: Diagnosis not present

## 2019-12-21 DIAGNOSIS — N281 Cyst of kidney, acquired: Secondary | ICD-10-CM | POA: Diagnosis not present

## 2019-12-21 DIAGNOSIS — I6523 Occlusion and stenosis of bilateral carotid arteries: Secondary | ICD-10-CM | POA: Diagnosis not present

## 2019-12-21 DIAGNOSIS — R5383 Other fatigue: Secondary | ICD-10-CM | POA: Diagnosis not present

## 2019-12-21 DIAGNOSIS — I671 Cerebral aneurysm, nonruptured: Secondary | ICD-10-CM

## 2019-12-21 DIAGNOSIS — R42 Dizziness and giddiness: Secondary | ICD-10-CM | POA: Diagnosis not present

## 2019-12-21 DIAGNOSIS — I6603 Occlusion and stenosis of bilateral middle cerebral arteries: Secondary | ICD-10-CM | POA: Diagnosis not present

## 2019-12-21 LAB — HEPATIC FUNCTION PANEL
ALT: 16 U/L (ref 0–44)
AST: 25 U/L (ref 15–41)
Albumin: 4.1 g/dL (ref 3.5–5.0)
Alkaline Phosphatase: 57 U/L (ref 38–126)
Bilirubin, Direct: 0.1 mg/dL (ref 0.0–0.2)
Indirect Bilirubin: 0.9 mg/dL (ref 0.3–0.9)
Total Bilirubin: 1 mg/dL (ref 0.3–1.2)
Total Protein: 7.5 g/dL (ref 6.5–8.1)

## 2019-12-21 LAB — TSH: TSH: 2.747 u[IU]/mL (ref 0.350–4.500)

## 2019-12-21 LAB — URINALYSIS, ROUTINE W REFLEX MICROSCOPIC
Bilirubin Urine: NEGATIVE
Glucose, UA: NEGATIVE mg/dL
Hgb urine dipstick: NEGATIVE
Ketones, ur: NEGATIVE mg/dL
Leukocytes,Ua: NEGATIVE
Nitrite: NEGATIVE
Protein, ur: NEGATIVE mg/dL
Specific Gravity, Urine: 1.014 (ref 1.005–1.030)
pH: 8 (ref 5.0–8.0)

## 2019-12-21 LAB — LIPASE, BLOOD: Lipase: 30 U/L (ref 11–51)

## 2019-12-21 LAB — MAGNESIUM: Magnesium: 1.9 mg/dL (ref 1.7–2.4)

## 2019-12-21 LAB — TROPONIN I (HIGH SENSITIVITY)
Troponin I (High Sensitivity): 7 ng/L (ref <18)
Troponin I (High Sensitivity): 5 ng/L (ref <18)

## 2019-12-21 LAB — SARS CORONAVIRUS 2 (TAT 6-24 HRS): SARS Coronavirus 2: NEGATIVE

## 2019-12-21 MED ORDER — HYDRALAZINE HCL 25 MG PO TABS
25.0000 mg | ORAL_TABLET | Freq: Two times a day (BID) | ORAL | Status: DC
  Filled 2019-12-21: qty 1

## 2019-12-21 MED ORDER — SODIUM CHLORIDE 0.9% FLUSH
3.0000 mL | Freq: Two times a day (BID) | INTRAVENOUS | Status: DC
  Administered 2019-12-21 (×2): 3 mL via INTRAVENOUS

## 2019-12-21 MED ORDER — ACETAMINOPHEN 650 MG RE SUPP: 650.0000 mg | Freq: Four times a day (QID) | RECTAL | Status: DC | PRN

## 2019-12-21 MED ORDER — ONDANSETRON HCL 4 MG/2ML IJ SOLN: 4.0000 mg | Freq: Four times a day (QID) | INTRAMUSCULAR | Status: DC | PRN

## 2019-12-21 MED ORDER — GADOBUTROL 1 MMOL/ML IV SOLN
4.8000 mL | Freq: Once | INTRAVENOUS | Status: AC | PRN
  Administered 2019-12-21: 15:00:00 4.8 mL via INTRAVENOUS

## 2019-12-21 MED ORDER — ALBUTEROL SULFATE (2.5 MG/3ML) 0.083% IN NEBU: 2.5000 mg | INHALATION_SOLUTION | Freq: Four times a day (QID) | RESPIRATORY_TRACT | Status: DC | PRN

## 2019-12-21 MED ORDER — IOHEXOL 300 MG/ML  SOLN
100.0000 mL | Freq: Once | INTRAMUSCULAR | Status: AC | PRN
  Administered 2019-12-21: 100 mL via INTRAVENOUS

## 2019-12-21 MED ORDER — ENOXAPARIN SODIUM 40 MG/0.4ML ~~LOC~~ SOLN
40.0000 mg | SUBCUTANEOUS | Status: DC
  Administered 2019-12-21: 40 mg via SUBCUTANEOUS
  Filled 2019-12-21 (×2): qty 0.4

## 2019-12-21 MED ORDER — HYDRALAZINE HCL 20 MG/ML IJ SOLN
10.0000 mg | INTRAMUSCULAR | Status: DC | PRN
  Administered 2019-12-21: 10 mg via INTRAVENOUS
  Filled 2019-12-21: qty 1

## 2019-12-21 MED ORDER — SODIUM CHLORIDE 0.9 % IV SOLN: INTRAVENOUS | Status: AC

## 2019-12-21 MED ORDER — ACETAMINOPHEN 325 MG PO TABS: 650.0000 mg | ORAL_TABLET | Freq: Four times a day (QID) | ORAL | Status: DC | PRN

## 2019-12-21 MED ORDER — SODIUM CHLORIDE 0.9 % IV BOLUS
500.0000 mL | Freq: Once | INTRAVENOUS | Status: AC
  Administered 2019-12-21: 500 mL via INTRAVENOUS

## 2019-12-21 MED ORDER — SODIUM CHLORIDE 0.9 % IV SOLN: Freq: Once | INTRAVENOUS | Status: AC

## 2019-12-21 MED ORDER — HYDRALAZINE HCL 25 MG PO TABS
25.0000 mg | ORAL_TABLET | Freq: Two times a day (BID) | ORAL | Status: DC
  Administered 2019-12-21 – 2019-12-22 (×3): 25 mg via ORAL
  Filled 2019-12-21 (×3): qty 1

## 2019-12-21 MED ORDER — ONDANSETRON HCL 4 MG PO TABS: 4.0000 mg | ORAL_TABLET | Freq: Four times a day (QID) | ORAL | Status: DC | PRN

## 2019-12-21 NOTE — ED Notes (Signed)
Attempted report 

## 2019-12-21 NOTE — Progress Notes (Signed)
MRI and MRA of the head neck did not show any acute signs of a stroke.  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  I had discussed patient's initial presentation with neurology for which they recommended MRI and MRA of the head neck.  May want to rediscuss findings with neurology in a.m.  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  PT/OT consult already in place.

## 2019-12-21 NOTE — ED Provider Notes (Signed)
[facility] EMERGENCY DEPARTMENT Provider Note   CSN: [id number] Arrival date & time: 12/20/19  2058     History Chief Complaint  Patient presents with  . Fatigue    Anne [name] is a 74 y.o. female presenting for evaluation of dizziness, weakness, lightheadedness.  Patient states that the past 4 to 6 weeks, she has noticed over 20 pound weight loss.  She reports intermittent headaches.  She reports constant dizziness, which sometimes is described as room spinning, but when she goes to stand up is described as lightheadedness as if she is about to pass out.  Patient states that sometimes when she goes to stand, she feels she is going to pass out and gets tunnel vision.  She then sits down and is extremely tired/fatigued.  She states she will sleep for up to 18 hours.  She reports intermittent chest pain, none currently.  Several weeks ago, she had severe shortness of breath was lasted for several minutes before resolving without intervention.  No recurrent shortness of breath.  She reports associated lower abdominal discomfort, which she describes as a pounding pain.  She has not followed up with her primary care doctor regarding the symptoms.  She sees Dr. Martinique with cardiology, last visit was several months ago.  She had a Holter monitor placed at that time, no arrhythmias were found.  She denies recent fevers, chills, cough, nausea, vomiting, urinary symptoms, abnormal bowel movements.  She lives at home by herself, has family in the area that checks on her frequently.  Additional history obtained from chart review.  Patient with a history of hyperlipidemia, hypertension, vertigo.   HPI     Past Medical History:  Diagnosis Date  . Complication of anesthesia    slow to wake up  . Diverticulosis   . Hiatal hernia   . Hypercholesterolemia   . Hypertension   . Osteopenia   . Vitamin D deficiency     Patient Active Problem List   Diagnosis Date Noted  .  Vertigo 12/21/2019  . Small bowel obstruction ([place]) 07/07/2015  . SBO (small bowel obstruction) ([place]) 07/07/2015  . 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 Back pain 10/21/2014  . Routine general medical examination at a health care facility 07/12/2014  . Hearing loss 09/27/2012  . Other screening mammogram 09/27/2012  . Pure hypercholesterolemia 09/25/2012  . HTN (hypertension) 10/01/2011    Past Surgical History:  Procedure Laterality Date  . ABDOMINAL HYSTERECTOMY     Partial  . fibrocystic breast excision    . LAPAROSCOPIC ILEOCECECTOMY  07/07/2015  . LAPAROTOMY N/A 07/07/2015   Procedure: EXPLORATORY LAPAROTOMY WITH ILEOCECECTOMY ;  Surgeon: Donnie Mesa, MD;  Location: [place];  Service: General;  Laterality: N/A;  . TUBAL LIGATION       OB History   No obstetric history on file.     Family History  Problem Relation Age of Onset  . Stroke Mother   . Hypertension Mother   . Stroke Brother   . Cancer Neg Hx   . Alcohol abuse Neg Hx   . Early death Neg Hx   . Hearing loss Neg Hx   . Heart disease Neg Hx   . Hyperlipidemia Neg Hx   . Kidney disease Neg Hx     Social History   Tobacco Use  . Smoking status: Never Smoker  . Smokeless tobacco: Never Used  Substance Use Topics  . Alcohol use: Yes    Comment: 3 times  a year  . Drug use: No    Home Medications Prior to Admission medications   Medication Sig Start Date End Date Taking? Authorizing Provider  Calcium 500 MG tablet Take 500 mg by mouth daily.    Yes [provider]  cholecalciferol (VITAMIN D) 1000 UNITS tablet Take 1,000 Units by mouth daily.   Yes [provider]  GARLIC PO Take 1 tablet by mouth daily.   Yes [provider]  magnesium gluconate (MAGONATE) 500 MG tablet Take 500 mg by mouth daily.   Yes [provider]  Multiple Vitamins-Minerals (MULTIVITAMIN WITH MINERALS) tablet Take 1 tablet by mouth daily.   Yes [provider]    triamterene-hydrochlorothiazide (MAXZIDE-25) 37.5-25 MG tablet Take 1 tablet by mouth daily. 10/14/19  Yes [provider]  amoxicillin-clavulanate (AUGMENTIN) 875-125 MG tablet Take 1 tablet by mouth every 12 (twelve) hours. Patient not taking: Reported on 12/21/2019 08/10/17   Volanda Napoleon, PA-C  HYDROcodone-acetaminophen (NORCO) 5-325 MG tablet Take 1-2 tablets by mouth every 4 (four) hours as needed for severe pain. Patient not taking: Reported on 12/21/2019 01/06/16   Sherwood Gambler, MD  methocarbamol (ROBAXIN) 500 MG tablet Take 1 tablet (500 mg total) by mouth 2 (two) times daily. Patient not taking: Reported on 12/21/2019 07/05/18   Henderly, Britni A, PA-C  naproxen sodium (ALEVE) 220 MG tablet Take 1 tablet (220 mg total) by mouth 2 (two) times daily with a meal. Patient not taking: Reported on 12/21/2019 01/06/16   Sherwood Gambler, MD  OVER THE COUNTER MEDICATION Take 1 tablet by mouth 3 (three) times daily. "BP monitor"    [provider]  potassium chloride (KLOR-CON) 10 MEQ tablet Take 20 mEq by mouth 2 (two) times daily. 08/19/19   [provider]    Allergies    Amiloride; Anesthetics, amide; Other; Asa [aspirin]; Diltiazem hcl; Eplerenone; Indomethacin; Naproxen; Norvasc [amlodipine besylate]; Tenex [guanfacine hcl]; Triamterene-hctz; Darcel Bayley hcl]; and Lisinopril  Review of Systems   Review of Systems  Constitutional: Positive for fatigue and unexpected weight change.  Respiratory: Positive for shortness of breath.   Cardiovascular: Positive for chest pain.  Gastrointestinal: Positive for abdominal pain.  Neurological: Positive for dizziness, weakness, light-headedness and headaches.  All other systems reviewed and are negative.   Physical Exam Updated Vital Signs BP (!) 159/74   Pulse 70   Temp 98.6 F (37 C) (Oral)   Resp 19   SpO2 99%   Physical Exam Vitals and nursing note reviewed.  Constitutional:      General: She is  not in acute distress.    Appearance: She is well-developed.     Comments: Elderly female who appears nontoxic  HENT:     Head: Normocephalic and atraumatic.  Eyes:     Extraocular Movements: Extraocular movements intact.     Conjunctiva/sclera: Conjunctivae normal.     Pupils: Pupils are equal, round, and reactive to light.     Comments: EOMI and PERRLA.  No nystagmus.  Cardiovascular:     Rate and Rhythm: Normal rate and regular rhythm.     Pulses: Normal pulses.  Pulmonary:     Effort: Pulmonary effort is normal. No respiratory distress.     Breath sounds: Normal breath sounds. No wheezing.  Abdominal:     General: There is no distension.     Palpations: Abdomen is soft. There is no mass.     Tenderness: There is no abdominal tenderness. There is no guarding or rebound.  Comments: Mild tenderness palpation of lower abdomen.  No rigidity, guarding, distention.  Negative rebound.  No peritonitis.  No CVA tenderness.  Musculoskeletal:        General: Normal range of motion.     Cervical back: Normal range of motion and neck supple.     Comments: Strength and sensation intact x4.  Skin:    General: Skin is warm and dry.     Capillary Refill: Capillary refill takes less than 2 seconds.  Neurological:     Mental Status: She is alert and oriented to person, place, and time.     GCS: GCS eye subscore is 4. GCS verbal subscore is 5. GCS motor subscore is 6.     Cranial Nerves: Cranial nerves are intact.     Sensory: Sensation is intact.     Motor: Motor function is intact. No weakness.     Coordination: Coordination normal. Finger-Nose-Finger Test normal.     Comments: No obvious neurologic deficits.  CN intact.  Nose to finger intact.  Fine movement and coronation intact.  Strength and sensation intact.     ED Results / Procedures / Treatments   Labs (all labs ordered are listed, but only abnormal results are displayed) Labs Reviewed  BASIC METABOLIC PANEL - Abnormal; Notable  for the following components:      Result Value   Potassium 3.4 (*)    Glucose, Bld 110 (*)    Creatinine, Ser 1.03 (*)    GFR calc non Af Amer 55 (*)    All other components within normal limits  CBC - Abnormal; Notable for the following components:   RBC 3.80 (*)    Hemoglobin 10.8 (*)    HCT 31.9 (*)    All other components within normal limits  URINALYSIS, ROUTINE W REFLEX MICROSCOPIC - Abnormal; Notable for the following components:   Color, Urine COLORLESS (*)    All other components within normal limits  CBG MONITORING, ED - Abnormal; Notable for the following components:   Glucose-Capillary 110 (*)    All other components within normal limits  SARS CORONAVIRUS 2 (TAT 6-24 HRS)  URINALYSIS, ROUTINE W REFLEX MICROSCOPIC  LIPASE, BLOOD  HEPATIC FUNCTION PANEL  TSH  MAGNESIUM  TROPONIN I (HIGH SENSITIVITY)  TROPONIN I (HIGH SENSITIVITY)    EKG EKG Interpretation  Date/Time:  Monday December 20 2019 21:23:10 EDT Ventricular Rate:  78 PR Interval:  184 QRS Duration: 84 QT Interval:  380 QTC Calculation: 433 R Axis:   0 Text Interpretation: Normal sinus rhythm Possible Anterior infarct , age undetermined Abnormal ECG No STEMI Confirmed by Octaviano Glow [phone]) on 12/21/2019 8:51:57 AM   Radiology CT Head Wo Contrast  Result Date: 12/21/2019 CLINICAL DATA:  Central vertigo. EXAM: CT HEAD WITHOUT CONTRAST TECHNIQUE: Contiguous axial images were obtained from the base of the skull through the vertex without intravenous contrast. COMPARISON:  01/06/2016 FINDINGS: Brain: No evidence of acute infarction, hemorrhage, hydrocephalus, extra-axial collection or mass lesion/mass effect. Vascular: Diffuse increased density of vessels after iodinated contrast for abdominal CT. Skull: Negative for fracture or bone lesion. Sinuses/Orbits: Minimal opacification of a right posterior ethmoid air cell. IMPRESSION: Negative head CT. Electronically Signed   By: Monte Fantasia M.D.   On:  12/21/2019 10:12   CT ABDOMEN PELVIS W CONTRAST  Result Date: 12/21/2019 CLINICAL DATA:  20 pound weight loss, lower abdominal pain history of ischemic bowel. EXAM: CT ABDOMEN AND PELVIS WITH CONTRAST TECHNIQUE: Multidetector CT imaging of the abdomen and  pelvis was performed using the standard protocol following bolus administration of intravenous contrast. CONTRAST:  141mL OMNIPAQUE IOHEXOL 300 MG/ML  SOLN COMPARISON:  July 07, 2015 FINDINGS: Lower chest: Basilar atelectasis. No consolidation. No pleural effusion. Hepatobiliary: Normal appearance of liver and gallbladder. No sign of biliary ductal dilation. Pancreas: Cystic lesion in the body of the pancreas measures 9 mm, when measured in a similar fashion on the prior exam this is stable at approximately 9 mm. (Image 25, series 3) A second cystic lesion approximately 12 mm. 11 mm on the prior exam also in the body of the pancreas. No main duct dilation or peripancreatic inflammation. Spleen: Spleen is normal size without focal lesion. Adrenals/Urinary Tract: Adrenal glands are normal. Renal cortical scarring. Small cyst arising from the lateral right kidney. Symmetric renal enhancement. No hydronephrosis. Stomach/Bowel: Stomach is decompressed limiting assessment. No evidence of small-bowel obstruction. Colonic diverticulosis. No signs of diverticulitis. Postoperative changes of ileocecal and small-bowel resection Vascular/Lymphatic: Calcified atherosclerotic plaque throughout the abdominal aorta extending in the iliac vessels. No adenopathy. No pelvic lymphadenopathy. Reproductive: Post hysterectomy. No adnexal mass. Other: No ascites. No mesenteric stranding. Bowel enhancement is preserved. Musculoskeletal: No acute musculoskeletal process. Sclerotic area in the left iliac bone with second area of lucency. Lucent area showing well-circumscribed margins. Sclerotic area geographic and along the sacroiliac joint. These findings are unchanged since 2016.  IMPRESSION: 1. No acute abnormality in the abdomen or pelvis. 2. Colonic diverticulosis without diverticulitis. 3. 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 Recommend follow up pre and post contrast MRI/MRCP or pancreatic protocol CT in 2 years. This recommendation follows ACR consensus guidelines: Management of Incidental Pancreatic Cysts: A White Paper of the ACR Incidental Findings Committee. J Am Coll Radiol [id number]. 4. Aortic atherosclerosis. Aortic Atherosclerosis (ICD10-I70.0). Electronically Signed   By: Zetta Bills M.D.   On: 12/21/2019 10:17    Procedures Procedures (including critical care time)  Medications Ordered in ED Medications  enoxaparin (LOVENOX) injection 40 mg (40 mg Subcutaneous Given 12/21/19 1146)  sodium chloride flush (NS) 0.9 % injection 3 mL (3 mLs Intravenous Given 12/21/19 1131)  acetaminophen (TYLENOL) tablet 650 mg (has no administration in time range)    Or  acetaminophen (TYLENOL) suppository 650 mg (has no administration in time range)  ondansetron (ZOFRAN) tablet 4 mg (has no administration in time range)    Or  ondansetron (ZOFRAN) injection 4 mg (has no administration in time range)  albuterol (PROVENTIL) (2.5 MG/3ML) 0.083% nebulizer solution 2.5 mg (has no administration in time range)  hydrALAZINE (APRESOLINE) injection 10 mg (10 mg Intravenous Given 12/21/19 1147)  hydrALAZINE (APRESOLINE) tablet 25 mg (has no administration in time range)  sodium chloride flush (NS) 0.9 % injection 3 mL (3 mLs Intravenous Given 12/21/19 0849)  sodium chloride 0.9 % bolus 500 mL (0 mLs Intravenous Stopped 12/21/19 1244)  iohexol (OMNIPAQUE) 300 MG/ML solution 100 mL (100 mLs Intravenous Contrast Given 12/21/19 0958)  0.9 %  sodium chloride infusion ( Intravenous New Bag/Given 12/21/19 1146)    ED Course  I have reviewed the triage vital signs and the nursing notes.  Pertinent labs & imaging results that were available during my care  of the patient were reviewed by me and considered in my medical decision making (see chart for details).    MDM Rules/Calculators/A&P                      Patient presenting for evaluation of dizziness,  lightheadedness, and fatigue.  On exam, patient appears nontoxic.  There are no obvious neurologic deficits on my exam.  I am concerned with patient's history of unintentional weight loss for possible cancer.  Also consider metabolic abnormality or electrolyte abnormality.  Consider anemia.  Consider stroke, although less likely with normal neuro exam.  Less likely arrhythmia as patient recently was evaluated with a Holter monitor.  Will obtain labs, CT head and abdomen, EKG, urine, and reassess.  EKG without STEMI.  CT head negative for acute findings.  CT abdomen pelvis shows stable pancreatic cyst, but no acute abnormality.  Labs show stable anemia at 10.  Creatinine is close to baseline at 1.  Urine without infection.  TSH normal.  Troponin normal.  Orthostatic vital signs showed no change in blood pressure or heart rate, however patient became extremely dizzy upon standing, needed RN assistance.  I am concerned about her ability to go home safely due to her dizziness and lightheadedness.  Case discussed with attending, Dr. Langston Masker evaluated the patient.  As patient has persistent symptoms, and I am concerned about her safety to go home, will call for admission.  Patient may benefit from possible echo and/or vascular imaging of the neck for further evaluation.  Discussed findings and plan with patient, who is agreeable.  Discussed with Dr. Tamala Julian and [facility]ad hospitalist service, patient to be admitted.  Final Clinical Impression(s) / ED Diagnoses Final diagnoses:  Dizziness  Lightheadedness  Fatigue, unspecified type  Weight loss    Rx / DC Orders ED Discharge Orders    None       Franchot Heidelberg, PA-C 12/21/19 1251    Wyvonnia Dusky, MD 12/21/19 2054

## 2019-12-21 NOTE — Progress Notes (Signed)
Pt arrived to floor via stretcher. Pt was oriented to room & call bell system. VS have been taken. Pt in no apparent distress at this time. CCMD has been notified, Pt has been placed on tele. 

## 2019-12-21 NOTE — Progress Notes (Addendum)
Pt states she takes triamterene-hctz 37.5-25 mg tablets at home daily for bp in the am. Pt doesn't want to switch medication or take multiple bp meds. Called and spoke with denise from pharmacy who informed that we do carry her home meds in the pharmacy and advised me to contact the pt's provider with her concerns. Will update pt. Spoke with provider and updated pt on reason for holding her home bp medication. Pt understanding and agrees with POC.

## 2019-12-21 NOTE — ED Notes (Signed)
Pt transported to MRI 

## 2019-12-21 NOTE — Progress Notes (Signed)
Pt states she doesn't want to regain her weight and that she lost the weight to help with her blood pressure. Pt states she doesn't want to take any ensure.
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
## 2019-12-21 NOTE — Progress Notes (Signed)
   12/21/19 1300  PT Visit Information  Last PT Received On 12/21/19  Reason Eval/Treat Not Completed Patient at procedure or test/unavailable  Pt has been taken to MRI, will reattempt at another time.  Mee Hives, PT MS Acute Rehab Dept. Number: [place] [facility] [place]

## 2019-12-22 DIAGNOSIS — R42 Dizziness and giddiness: Secondary | ICD-10-CM | POA: Diagnosis not present

## 2019-12-22 DIAGNOSIS — K862 Cyst of pancreas: Secondary | ICD-10-CM | POA: Diagnosis not present

## 2019-12-22 LAB — CBC
HCT: 32.6 % — ABNORMAL LOW (ref 36.0–46.0)
Hemoglobin: 10.9 g/dL — ABNORMAL LOW (ref 12.0–15.0)
MCH: 27.9 pg (ref 26.0–34.0)
MCHC: 33.4 g/dL (ref 30.0–36.0)
MCV: 83.6 fL (ref 80.0–100.0)
Platelets: 180 10*3/uL (ref 150–400)
RBC: 3.9 MIL/uL (ref 3.87–5.11)
RDW: 14.2 % (ref 11.5–15.5)
WBC: 4.4 10*3/uL (ref 4.0–10.5)
nRBC: 0 % (ref 0.0–0.2)

## 2019-12-22 LAB — BASIC METABOLIC PANEL
Anion gap: 12 (ref 5–15)
BUN: 10 mg/dL (ref 8–23)
CO2: 26 mmol/L (ref 22–32)
Calcium: 9 mg/dL (ref 8.9–10.3)
Chloride: 101 mmol/L (ref 98–111)
Creatinine, Ser: 0.88 mg/dL (ref 0.44–1.00)
GFR calc Af Amer: >60 mL/min (ref >60)
GFR calc non Af Amer: >60 mL/min (ref >60)
Glucose, Bld: 105 mg/dL — ABNORMAL HIGH (ref 70–99)
Potassium: 3.1 mmol/L — ABNORMAL LOW (ref 3.5–5.1)
Sodium: 139 mmol/L (ref 135–145)

## 2019-12-22 MED ORDER — HYDRALAZINE HCL 25 MG PO TABS: 25.0000 mg | ORAL_TABLET | Freq: Two times a day (BID) | ORAL | 0 refills | Status: DC

## 2019-12-22 MED ORDER — MECLIZINE HCL 25 MG PO TABS: 25.0000 mg | ORAL_TABLET | Freq: Three times a day (TID) | ORAL | 0 refills | Status: DC | PRN
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
## 2019-12-22 NOTE — TOC Transition Note (Signed)
Transition of Care [facility]) - CM/SW Discharge Note   Patient Details  Name: Anne [name] MRN: [id number] Date of Birth: [birth year]  Transition of Care [facility] CM/SW Contact:  Maryclare Labrador, RN Phone Number: 12/22/2019, 2:06 PM   Clinical Narrative:   PTA independent from home - her son is often at her home and can help her will any assistance if needed.  Pt confirms she has a PCP and denied barriers with paying for discharge meds.  Pt in agreement for rolling walker not rollator.  Pt chose [facility] - agency accepts referral.  Attending made referral via epic for outpt PT. Discharge order signed - no outstanding TOC needs - CM signing off       Barriers to Discharge: Continued Medical Work up   Patient Goals and CMS Choice Patient states their goals for this hospitalization and ongoing recovery are:: Pt states she is ready to get back home and away from the hosptial and all the sickness here CMS Medicare.gov Compare Post Acute Care list provided to:: Patient Choice offered to / list presented to : Patient  Discharge Placement                       Discharge Plan and Services                DME Arranged: Walker rolling DME Agency: [facility] Date DME Agency Contacted: 12/22/19 Time DME Agency Contacted: [phone] Representative spoke with at DME Agency: [place] ([place] Interventions     Readmission Risk Interventions No flowsheet data found.

## 2019-12-22 NOTE — Discharge Summary (Signed)
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 Colonic diverticulosis without diverticulitis. 3. 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 Recommend follow up pre and post contrast MRI/MRCP or pancreatic protocol CT in 2 years. This recommendation follows ACR consensus guidelines: Management of Incidental Pancreatic Cysts: A White Paper of the ACR Incidental Findings Committee. J Am Coll Radiol [id number]. 4. Aortic atherosclerosis. Aortic Atherosclerosis (ICD10-I70.0). Electronically Signed   By: Zetta Bills M.D.   On: 12/21/2019 10:17      Subjective: No nausea or vomiting, feels a combination of room spinning-type dizziness and wooziness. Has no focal numbness or weakness currently.  Discharge Exam: Vitals:   12/22/19 0800 12/22/19 1200  BP: (!) 156/87 (!) 143/90  Pulse: 75   Resp: 14 15  Temp:    SpO2: 98%    General: Pt is alert, awake, not in acute distress Cardiovascular: RRR, S1/S2 +, no rubs, no gallops Respiratory: CTA bilaterally, no wheezing, no rhonchi Abdominal: Soft, NT, ND, bowel sounds + Extremities: No edema, no cyanosis Neuro: No nystagmus, gait  slow with narrow base. Positive symptoms on PT's evaluation of left canal. No focal motor or sensory deficits in cranial or peripheral nerves bilaterally.  Labs: Basic Metabolic Panel: Recent Labs  Lab 12/20/19 2131 12/21/19 1119 12/22/19 0358  NA 137  --  139  K 3.4*  --  3.1*  CL 101  --  101  CO2 26  --  26  GLUCOSE 110*  --  105*  BUN 12  --  10  CREATININE 1.03*  --  0.88  CALCIUM 9.2  --  9.0  MG  --  1.9  --    Liver Function Tests: Recent Labs  Lab 12/21/19 0848  AST 25  ALT 16  ALKPHOS 57  BILITOT 1.0  PROT 7.5  ALBUMIN 4.1   Recent Labs  Lab 12/21/19 0848  LIPASE 30   No results for input(s): AMMONIA in the last 168 hours. CBC: Recent Labs  Lab 12/20/19 2131 12/22/19 0358  WBC 7.9 4.4  HGB 10.8* 10.9*  HCT 31.9* 32.6*  MCV 83.9 83.6  PLT 196 180   Cardiac Enzymes: No results for input(s): CKTOTAL, CKMB, CKMBINDEX, TROPONINI in the last 168 hours. BNP: Invalid input(s): POCBNP CBG: No results for input(s): GLUCAP in the  last 168 hours. D-Dimer No results for input(s): DDIMER in the last 72 hours. Hgb A1c No results for input(s): HGBA1C in the last 72 hours. Lipid Profile No results for input(s): CHOL, HDL, LDLCALC, TRIG, CHOLHDL, LDLDIRECT in the last 72 hours. Thyroid function studies No results for input(s): TSH, T4TOTAL, T3FREE, THYROIDAB in the last 72 hours.  Invalid input(s): FREET3 Anemia work up No results for input(s): VITAMINB12, FOLATE, FERRITIN, TIBC, IRON, RETICCTPCT in the last 72 hours. Urinalysis    Component Value Date/Time   COLORURINE COLORLESS (A) 12/21/2019 1108   APPEARANCEUR CLEAR 12/21/2019 1108   LABSPEC 1.014 12/21/2019 1108   PHURINE 8.0 12/21/2019 1108   GLUCOSEU NEGATIVE 12/21/2019 1108   GLUCOSEU NEGATIVE 09/30/2013 1255   HGBUR NEGATIVE 12/21/2019 1108   [place] 12/21/2019 1108   [place] 12/21/2019 1108   PROTEINUR NEGATIVE 12/21/2019 1108   UROBILINOGEN 0.2 07/12/2015 0939    NITRITE NEGATIVE 12/21/2019 1108   LEUKOCYTESUR NEGATIVE 12/21/2019 1108    Microbiology Recent Results (from the past 240 hour(s))  SARS CORONAVIRUS 2 (TAT 6-24 HRS) Nasopharyngeal Nasopharyngeal Swab     Status: None   Collection Time: 12/21/19 11:13 AM   Specimen: Nasopharyngeal Swab  Result Value Ref Range Status   SARS Coronavirus 2 NEGATIVE NEGATIVE Final    Comment: (NOTE) SARS-CoV-2 target nucleic acids are NOT DETECTED. The SARS-CoV-2 RNA is generally detectable in upper and lower respiratory specimens during the acute phase of infection. Negative results do not preclude SARS-CoV-2 infection, do not rule out co-infections with other pathogens, and should not be used as the sole basis for treatment or other patient management decisions. Negative results must be combined with clinical observations, patient history, and epidemiological information. The expected result is Negative. Fact Sheet for Patients: SugarRoll.be Fact Sheet for Healthcare Providers: https://www.woods-mathews.com/ This test is not yet approved or cleared by the [place] FDA and  has been authorized for detection and/or diagnosis of SARS-CoV-2 by FDA under an Emergency Use Authorization (EUA). This EUA will remain  in effect (meaning this test can be used) for the duration of the COVID-19 declaration under Section 56 4(b)(1) of the Act, 21 U.S.C. section 360bbb-3(b)(1), unless the authorization is terminated or revoked sooner. Performed at [facility], [place] [address] [place], [place] [address]     Time coordinating discharge: Approximately 40 minutes  Patrecia Pour, MD  [facility] 12/27/2019, 9:28 PM

## 2019-12-22 NOTE — Evaluation (Signed)
Physical Therapy Evaluation Patient Details Name: Anne [name] MRN: [id number] DOB: July 24, 1946 Today's Date: 12/22/2019   History of Present Illness  Pt is 74 yo female with PMH including HTN, HLD, and diverticulosis who presented with complaints of dizziness.  Pt admitted with HTN, vertigo, and renal insufficiency.   MRI/MRA was taken and did not reveal acute changes.  However, MD note regarding MRI results: "MRI and MRA of the head neck did not show any acute signs of a stroke.  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  I had discussed patient's initial presentation with neurology for which they recommended MRI and MRA of the head neck.  May want to rediscuss findings with neurology in a.m.  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  PT/OT consult already in place."   Clinical Impression  Pt admitted with above diagnosis. Pt did have some changes in MRI that could be contributing to vertigo per MD note. Additionally, pt with symptoms consistent with L canal BPPV - did not see nystagmus but had positive symptoms on L side.  She ambulated with cautious gait pattern but did not have LOB.  Will benefit from acute PT to address vertigo, balance, and habituation exercises.   Pt currently with functional limitations due to the deficits listed below (see PT Problem List). Pt will benefit from skilled PT to increase their independence and safety with mobility to allow discharge to the venue listed below.       Follow Up Recommendations Outpatient PT(Vestibular rehab)    Equipment Recommendations  Rolling walker with 5" wheels    Recommendations for Other Services       Precautions / Restrictions  Precautions Precautions: Fall Restrictions Weight Bearing Restrictions: No      Mobility  Bed Mobility Overal bed mobility: Independent             General bed mobility comments: increased time due to dizziness  Transfers Overall transfer level: Needs assistance Equipment used: None Transfers: Sit to/from Stand Sit to Stand: Min guard Stand pivot transfers: Min guard       General transfer comment: increased time and cues for focus point; min guard for safety  Ambulation/Gait Ambulation/Gait assistance: Min guard Gait Distance (Feet): 20 Feet Assistive device: None Gait Pattern/deviations: Step-through pattern;Shuffle;Decreased stride length;Wide base of support Gait velocity: decreased   General Gait Details: Pt reports ambulating earlier with OT in hall using RW.  Had pt ambulate without AD as this is baseline.  She demonstrated cautious gait due to dizziness but no LOB.  Pt was cued on focus point and segmental turns that she reports improved dizziness  Stairs            Wheelchair Mobility    Modified Rankin (Stroke Patients Only)       Balance Overall balance assessment: Needs assistance Sitting-balance support: Feet supported Sitting balance-Leahy Scale: Good     Standing balance support: No upper extremity supported Standing balance-Leahy Scale: Fair Standing balance comment: Pt swaying when standing still but no LOB; did not test further balance due to dizziness and unsteady                             Pertinent  Vitals/Pain Pain Assessment: No/denies pain Faces Pain Scale: Hurts a little bit Pain Location: head Pain Descriptors / Indicators: Aching Pain Intervention(s): Monitored during session;Other (comment)(RN notified)    Home Living Family/patient expects to be discharged to:: Private residence Living Arrangements: Alone Available Help at Discharge: Family;Available PRN/intermittently Type of Home: House Home Access:  Stairs to enter Entrance Stairs-Rails: Can reach both Entrance Stairs-Number of Steps: 3  Home Layout: Two level;Able to live on main level with bedroom/bathroom Home Equipment: Grab bars - tub/shower      Prior Function Level of Independence: Independent         Comments: Independent for all daily tasks and mobility without DME. Pt was active, a member of [employer] and [employer]     Hand Dominance   Dominant Hand: Right    Extremity/Trunk Assessment   Upper Extremity Assessment Upper Extremity Assessment: Defer to OT evaluation    Lower Extremity Assessment Lower Extremity Assessment: Overall WFL for tasks assessed    Cervical / Trunk Assessment Cervical / Trunk Assessment: Normal  Communication   Communication: No difficulties  Cognition Arousal/Alertness: Awake/alert Behavior During Therapy: WFL for tasks assessed/performed Overall Cognitive Status: Within Functional Limits for tasks assessed                                        General Comments General comments (skin integrity, edema, etc.):   BPs were stable with transfers and HR and O2 stable on RA.  Vestibular:  History: Reports dizziness started a couple weeks ago and gotten progressively worse.  Describes dizziness as room spinning. Reports worsens with any movement but there minimally at all times.  Reports better now than when she came in to hospital.  Reports episode years ago of dizziness that caused her to go to ED - she can't remember what diagnosis was but states may have started with "m" and was told to be cautious to keep fluid out of ears at that time (used ear plugs, shower cap, etc)  EOEM: intact, no nystagmus, did have + symptoms with fast lateral movements Gaze Stabilization: intact, no nystagmus, did have + symptoms with R/L movements [employer] R: no symptoms no nystagmus [employer] L: + symptoms that eased after 30 sec but no nystagmus  Performed Epleys manuevar  for L posterior canal BPPV  Educated pt on BPPV but also MD would be in to discuss MRI results at later time.  Pt was educated on PT recommendations for PT balance training, compensation techniques (focus points, use RW, and segmental turning), Epley's, and habituation exercise.  Provided pt with HEP. Recommend f/u with outpt therapy for vestibular rehab.       Exercises     Assessment/Plan    PT Assessment Patient needs continued PT services  PT Problem List Decreased strength;Decreased mobility;Decreased activity tolerance;Decreased balance;Decreased knowledge of use of DME;Other (comment)(dizziness)       PT Treatment Interventions DME instruction;Therapeutic activities;Gait training;Therapeutic exercise;Patient/family education;Stair training;Balance training;Neuromuscular re-education;Functional mobility training;Other (comment)(vestibular : habituation, balance, compensation, canalith repositioning)    PT Goals (Current goals can be found in the Care Plan section)  Acute Rehab PT Goals Patient Stated Goal: find out what is causing dizziness  PT Goal Formulation: With patient Time For Goal Achievement: 01/05/20 Potential to Achieve Goals: Good    Frequency Min 3X/week   Barriers to discharge Decreased caregiver support  Co-evaluation               AM-PAC PT "6 Clicks" Mobility  Outcome Measure Help needed turning from your back to your side while in a flat bed without using bedrails?: None Help needed moving from lying on your back to sitting on the side of a flat bed without using bedrails?: None Help needed moving to and from a bed to a chair (including a wheelchair)?: None Help needed standing up from a chair using your arms (e.g., wheelchair or bedside chair)?: None Help needed to walk in hospital room?: A Little Help needed climbing 3-5 steps with a railing? : A Little 6 Click Score: 22    End of Session Equipment Utilized During Treatment: Gait  belt Activity Tolerance: Patient tolerated treatment well Patient left: in bed;with call bell/phone within reach(sitting EOB to eat (reports she has been doing this and will call for assist if wants up)) Nurse Communication: Mobility status PT Visit Diagnosis: Other abnormalities of gait and mobility (R26.89);Dizziness and giddiness (R42)    Time: [id number] PT Time Calculation (min) (ACUTE ONLY): 48 min   Charges:   PT Evaluation $PT Eval Moderate Complexity: 1 Mod PT Treatments $Therapeutic Activity: 8-22 mins $Canalith Rep Proc: 8-22 mins        Maggie Font, PT Acute Rehab Services Pager [phone] [facility] [phone] [facility] [phone]   Karlton Lemon 12/22/2019, 12:28 PM

## 2019-12-22 NOTE — Plan of Care (Signed)
New pt

## 2019-12-22 NOTE — Progress Notes (Signed)
Patient given D/C instructions and walker. Patient verbalizes understanding. Patient with no complaints. Daughter at bedside and patient taken out to car via [place].

## 2019-12-23 LAB — HEMOGLOBIN A1C
Hgb A1c MFr Bld: 5.8 % — ABNORMAL HIGH (ref 4.8–5.6)
Mean Plasma Glucose: 120 mg/dL

## 2020-01-20 DIAGNOSIS — E785 Hyperlipidemia, unspecified: Secondary | ICD-10-CM | POA: Diagnosis not present

## 2020-01-20 DIAGNOSIS — E0800 Diabetes mellitus due to underlying condition with hyperosmolarity without nonketotic hyperglycemic-hyperosmolar coma (NKHHC): Secondary | ICD-10-CM | POA: Diagnosis not present

## 2020-01-20 DIAGNOSIS — J441 Chronic obstructive pulmonary disease with (acute) exacerbation: Secondary | ICD-10-CM | POA: Diagnosis not present

## 2020-01-20 DIAGNOSIS — R7309 Other abnormal glucose: Secondary | ICD-10-CM | POA: Diagnosis not present

## 2020-01-20 DIAGNOSIS — E559 Vitamin D deficiency, unspecified: Secondary | ICD-10-CM | POA: Diagnosis not present

## 2020-01-20 DIAGNOSIS — E782 Mixed hyperlipidemia: Secondary | ICD-10-CM | POA: Diagnosis not present

## 2020-01-20 DIAGNOSIS — I10 Essential (primary) hypertension: Secondary | ICD-10-CM | POA: Diagnosis not present

## 2020-01-20 DIAGNOSIS — F064 Anxiety disorder due to known physiological condition: Secondary | ICD-10-CM | POA: Diagnosis not present

## 2020-01-20 DIAGNOSIS — F410 Panic disorder [episodic paroxysmal anxiety] without agoraphobia: Secondary | ICD-10-CM | POA: Diagnosis not present

## 2020-01-20 DIAGNOSIS — E876 Hypokalemia: Secondary | ICD-10-CM | POA: Diagnosis not present

## 2020-01-20 DIAGNOSIS — L659 Nonscarring hair loss, unspecified: Secondary | ICD-10-CM | POA: Diagnosis not present

## 2020-01-20 DIAGNOSIS — R51 Headache with orthostatic component, not elsewhere classified: Secondary | ICD-10-CM | POA: Diagnosis not present

## 2020-03-06 ENCOUNTER — Emergency Department (HOSPITAL_COMMUNITY)
Admission: EM | Admit: 2020-03-06 | Discharge: 2020-03-06 | Disposition: A | Discharge status: Home / Self Care | Payer: PPO | Attending: Emergency Medicine | Admitting: Emergency Medicine

## 2020-03-06 ENCOUNTER — Emergency Department (HOSPITAL_COMMUNITY): Payer: PPO

## 2020-03-06 ENCOUNTER — Encounter (HOSPITAL_COMMUNITY): Payer: Self-pay

## 2020-03-06 DIAGNOSIS — J3489 Other specified disorders of nose and nasal sinuses: Secondary | ICD-10-CM | POA: Diagnosis not present

## 2020-03-06 DIAGNOSIS — I10 Essential (primary) hypertension: Secondary | ICD-10-CM | POA: Insufficient documentation

## 2020-03-06 DIAGNOSIS — R519 Headache, unspecified: Secondary | ICD-10-CM

## 2020-03-06 DIAGNOSIS — E119 Type 2 diabetes mellitus without complications: Secondary | ICD-10-CM | POA: Diagnosis not present

## 2020-03-06 DIAGNOSIS — I6782 Cerebral ischemia: Secondary | ICD-10-CM | POA: Diagnosis not present

## 2020-03-06 DIAGNOSIS — R42 Dizziness and giddiness: Secondary | ICD-10-CM

## 2020-03-06 DIAGNOSIS — J322 Chronic ethmoidal sinusitis: Secondary | ICD-10-CM | POA: Diagnosis not present

## 2020-03-06 DIAGNOSIS — R001 Bradycardia, unspecified: Secondary | ICD-10-CM | POA: Diagnosis not present

## 2020-03-06 DIAGNOSIS — Z79899 Other long term (current) drug therapy: Secondary | ICD-10-CM | POA: Insufficient documentation

## 2020-03-06 DIAGNOSIS — R29810 Facial weakness: Secondary | ICD-10-CM | POA: Diagnosis not present

## 2020-03-06 DIAGNOSIS — G4489 Other headache syndrome: Secondary | ICD-10-CM | POA: Diagnosis not present

## 2020-03-06 DIAGNOSIS — R110 Nausea: Secondary | ICD-10-CM | POA: Diagnosis not present

## 2020-03-06 DIAGNOSIS — G44219 Episodic tension-type headache, not intractable: Secondary | ICD-10-CM | POA: Insufficient documentation

## 2020-03-06 LAB — RAPID URINE DRUG SCREEN, HOSP PERFORMED
Amphetamines: NOT DETECTED
Barbiturates: NOT DETECTED
Benzodiazepines: NOT DETECTED
Cocaine: NOT DETECTED
Opiates: NOT DETECTED
Tetrahydrocannabinol: NOT DETECTED

## 2020-03-06 LAB — CBC
HCT: 35.3 % — ABNORMAL LOW (ref 36.0–46.0)
Hemoglobin: 11.9 g/dL — ABNORMAL LOW (ref 12.0–15.0)
MCH: 27.9 pg (ref 26.0–34.0)
MCHC: 33.7 g/dL (ref 30.0–36.0)
MCV: 82.9 fL (ref 80.0–100.0)
Platelets: 201 10*3/uL (ref 150–400)
RBC: 4.26 MIL/uL (ref 3.87–5.11)
RDW: 14 % (ref 11.5–15.5)
WBC: 3.6 10*3/uL — ABNORMAL LOW (ref 4.0–10.5)
nRBC: 0 % (ref 0.0–0.2)

## 2020-03-06 LAB — COMPREHENSIVE METABOLIC PANEL
ALT: 15 U/L (ref 0–44)
AST: 23 U/L (ref 15–41)
Albumin: 4.3 g/dL (ref 3.5–5.0)
Alkaline Phosphatase: 67 U/L (ref 38–126)
Anion gap: 9 (ref 5–15)
BUN: 12 mg/dL (ref 8–23)
CO2: 28 mmol/L (ref 22–32)
Calcium: 9.7 mg/dL (ref 8.9–10.3)
Chloride: 102 mmol/L (ref 98–111)
Creatinine, Ser: 0.99 mg/dL (ref 0.44–1.00)
GFR calc Af Amer: >60 mL/min (ref >60)
GFR calc non Af Amer: 57 mL/min — ABNORMAL LOW (ref >60)
Glucose, Bld: 92 mg/dL (ref 70–99)
Potassium: 3.6 mmol/L (ref 3.5–5.1)
Sodium: 139 mmol/L (ref 135–145)
Total Bilirubin: 0.6 mg/dL (ref 0.3–1.2)
Total Protein: 7.9 g/dL (ref 6.5–8.1)

## 2020-03-06 LAB — I-STAT CHEM 8, ED
BUN: 10 mg/dL (ref 8–23)
Calcium, Ion: 1.25 mmol/L (ref 1.15–1.40)
Chloride: 101 mmol/L (ref 98–111)
Creatinine, Ser: 0.9 mg/dL (ref 0.44–1.00)
Glucose, Bld: 89 mg/dL (ref 70–99)
HCT: 35 % — ABNORMAL LOW (ref 36.0–46.0)
Hemoglobin: 11.9 g/dL — ABNORMAL LOW (ref 12.0–15.0)
Potassium: 3.4 mmol/L — ABNORMAL LOW (ref 3.5–5.1)
Sodium: 140 mmol/L (ref 135–145)
TCO2: 27 mmol/L (ref 22–32)

## 2020-03-06 LAB — URINALYSIS, ROUTINE W REFLEX MICROSCOPIC
Bilirubin Urine: NEGATIVE
Glucose, UA: NEGATIVE mg/dL
Hgb urine dipstick: NEGATIVE
Ketones, ur: NEGATIVE mg/dL
Leukocytes,Ua: NEGATIVE
Nitrite: NEGATIVE
Protein, ur: NEGATIVE mg/dL
Specific Gravity, Urine: 1.003 — ABNORMAL LOW (ref 1.005–1.030)
pH: 8 (ref 5.0–8.0)

## 2020-03-06 LAB — ETHANOL: Alcohol, Ethyl (B): <10 mg/dL (ref <10)

## 2020-03-06 LAB — PROTIME-INR
INR: 1 (ref 0.8–1.2)
Prothrombin Time: 12.8 seconds (ref 11.4–15.2)

## 2020-03-06 LAB — CBG MONITORING, ED: Glucose-Capillary: 89 mg/dL (ref 70–99)

## 2020-03-06 LAB — APTT: aPTT: 29 seconds (ref 24–36)

## 2020-03-06 MED ORDER — SODIUM CHLORIDE 0.9% FLUSH
3.0000 mL | Freq: Once | INTRAVENOUS | Status: AC
  Administered 2020-03-06: 3 mL via INTRAVENOUS

## 2020-03-06 MED ORDER — LORAZEPAM 2 MG/ML IJ SOLN
0.5000 mg | Freq: Once | INTRAMUSCULAR | Status: AC
  Administered 2020-03-06: 0.5 mg via INTRAVENOUS
  Filled 2020-03-06: qty 1

## 2020-03-06 NOTE — ED Provider Notes (Signed)
[facility] DEPT Provider Note   CSN: [id number] Arrival date & time: 03/06/20  1251     History Chief Complaint  Patient presents with  . Hypertension  . Headache  . Nausea    Anne [name] is a 74 y.o. female.  Who presents emergency department with chief complaint of headaches, labile blood pressure and vertigo.  The was admitted for similar complaints of persistent vertigo back in April 2021.  She had an MRA and MRI of the brain.  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  Patient states that she is currently on Maxide 37-25 and hydralazine 25 mg.  She states that normally she takes her medications separately after she drinks a glass of warm water however yesterday she took both of them at the same time.  She sat down on her couch and about 30 minutes later she felt very bad.  She states that she felt dizzy, very lightheaded but did not had a headache.  She felt the room spinning.  She took her blood pressure and states that her systolic pressure dropped down to 811/91 systolic.  She states that she began feeling better about an hour later and her blood pressure came up to 120.  Later on she was in her laundry room and she started having a bad headache and had onset of room spinning dizziness, nausea, ataxia.  She states that she took her blood pressure goes above 180.  She said that her symptoms would resolve when her blood pressure would drop below 170 but her pressure was labile all day and so anytime it would go back above 180 she would get the same symptoms again.  This morning she had the same issues.  She only took her Maxide and did not take her hydralazine.  Her blood pressure got very high she had a throbbing headache along with  the room spinning dizziness.  She called a nurse hotline but told her she needed to come to the emergency department.  She noticed over the past 2 days that she has had bad left-sided hand pain and that her fingers have been difficult to control on the left.  Other than that she has had intermittent carpopedal spasms in the other extremities.  She denies current headache or room spinning dizziness.  She feels like she has had some change in vision over the past 2 days.  She denies chest pain, shortness of breath. HPI     Past Medical History:  Diagnosis Date  . Complication of anesthesia    slow to wake up  . Diverticulosis   . Hiatal hernia   . Hypercholesterolemia   . Hypertension   . Osteopenia   . Vitamin D deficiency     Patient Active Problem List   Diagnosis Date Noted  . Vertigo 12/21/2019  . Weight loss, non-intentional 12/21/2019  . Hypertensive urgency 12/21/2019  . Renal insufficiency 12/21/2019  . Pancreatic cyst 12/21/2019  . History of diabetes mellitus 12/21/2019  . Small bowel obstruction ([place]) 07/07/2015  . SBO (small bowel obstruction) ([place]) 07/07/2015  . 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 Back pain 10/21/2014  . Routine general medical examination at a health care facility 07/12/2014  . Hearing loss 09/27/2012  . Other screening mammogram 09/27/2012  .  Pure hypercholesterolemia 09/25/2012  . Hypokalemia 08/31/2012  . HTN (hypertension) 10/01/2011    Past Surgical History:  Procedure Laterality Date  . ABDOMINAL HYSTERECTOMY     Partial  . fibrocystic breast excision    . LAPAROSCOPIC ILEOCECECTOMY  07/07/2015  . LAPAROTOMY N/A 07/07/2015   Procedure: EXPLORATORY LAPAROTOMY WITH ILEOCECECTOMY ;  Surgeon: Donnie Mesa, MD;  Location: [facility];  Service: General;  Laterality: N/A;  . TUBAL LIGATION       OB History   No obstetric history on file.     Family History  Problem Relation Age of Onset  . Stroke Mother   . Hypertension Mother     . Stroke Brother   . Cancer Neg Hx   . Alcohol abuse Neg Hx   . Early death Neg Hx   . Hearing loss Neg Hx   . Heart disease Neg Hx   . Hyperlipidemia Neg Hx   . Kidney disease Neg Hx     Social History   Tobacco Use  . Smoking status: Never Smoker  . Smokeless tobacco: Never Used  Substance Use Topics  . Alcohol use: Yes    Comment: 3 times a year  . Drug use: No    Home Medications Prior to Admission medications   Medication Sig Start Date End Date Taking? Authorizing Provider  cholecalciferol (VITAMIN D) 1000 UNITS tablet Take 1,000 Units by mouth daily.   Yes [provider]  GARLIC PO Take 1 tablet by mouth daily.   Yes [provider]  hydrALAZINE (APRESOLINE) 25 MG tablet Take 1 tablet (25 mg total) by mouth 2 (two) times daily. 12/22/19  Yes Patrecia Pour, MD  magnesium 30 MG tablet Take 30 mg by mouth daily.   Yes [provider]  meclizine (ANTIVERT) 25 MG tablet Take 1 tablet (25 mg total) by mouth 3 (three) times daily as needed for dizziness. 12/22/19  Yes Patrecia Pour, MD  Multiple Vitamins-Minerals (MULTIVITAMIN WITH MINERALS) tablet Take 1 tablet by mouth daily.   Yes [provider]  triamterene-hydrochlorothiazide (MAXZIDE-25) 37.5-25 MG tablet Take 1 tablet by mouth daily. 10/14/19  Yes [provider]  Calcium 500 MG tablet Take 500 mg by mouth daily.  Patient not taking: Reported on 03/06/2020    [provider]  magnesium gluconate (MAGONATE) 500 MG tablet Take 500 mg by mouth daily. Patient not taking: Reported on 03/06/2020    [provider]  OVER THE COUNTER MEDICATION Take 1 tablet by mouth 3 (three) times daily. "BP monitor" Patient not taking: Reported on 03/06/2020    [provider]    Allergies    Amiloride; Anesthetics, amide; Other; Asa [aspirin]; Diltiazem hcl; Eplerenone; Indomethacin; Naproxen; Norvasc [amlodipine besylate]; Tenex [guanfacine hcl]; Triamterene-hctz; Darcel Bayley hcl]; and Lisinopril  Review of Systems   Review of Systems Ten systems reviewed and are negative for acute change, except as noted in the HPI.   Physical Exam Updated Vital Signs BP 137/83   Pulse 71   Temp 98.5 F (36.9 C) (Oral)   Resp 12   SpO2 98%   Physical Exam Vitals and nursing note reviewed.  Constitutional:      General: She is not in acute distress.    Appearance: She is well-developed. She is not diaphoretic.  HENT:     Head: Normocephalic and atraumatic.  Eyes:     General: No visual field deficit or scleral icterus.    Conjunctiva/sclera: Conjunctivae normal.  Cardiovascular:  Rate and Rhythm: Normal rate and regular rhythm.     Heart sounds: Normal heart sounds. No murmur heard.  No friction rub. No gallop.   Pulmonary:     Effort: Pulmonary effort is normal. No respiratory distress.     Breath sounds: Normal breath sounds.  Abdominal:     General: Bowel sounds are normal. There is no distension.     Palpations: Abdomen is soft. There is no mass.     Tenderness: There is no abdominal tenderness. There is no guarding.  Musculoskeletal:     Cervical back: Normal range of motion.  Skin:    General: Skin is warm and dry.  Neurological:     Mental Status: She is alert and oriented to person, place, and time.     GCS: GCS eye subscore is 4. GCS verbal subscore is 5. GCS motor subscore is 6.     Cranial Nerves: No cranial nerve deficit, dysarthria or facial asymmetry.     Motor: Weakness present.     Coordination: Coordination abnormal.     Deep Tendon Reflexes: Reflexes normal.     Comments: Speech is clear and goal oriented, follows commands Major Cranial nerves without deficit, no facial droop Weakness of the left Upper extremity Sensation abnormal Left UE normal  Moves extremities without ataxia, coordination intact Abnormal finger to nose on the left no pronator drift   Psychiatric:        Mood and Affect: Mood is anxious.         Behavior: Behavior normal.     ED Results / Procedures / Treatments   Labs (all labs ordered are listed, but only abnormal results are displayed) Labs Reviewed  COMPREHENSIVE METABOLIC PANEL - Abnormal; Notable for the following components:      Result Value   GFR calc non Af Amer 57 (*)    All other components within normal limits  CBC - Abnormal; Notable for the following components:   WBC 3.6 (*)    Hemoglobin 11.9 (*)    HCT 35.3 (*)    All other components within normal limits  URINALYSIS, ROUTINE W REFLEX MICROSCOPIC - Abnormal; Notable for the following components:   Color, Urine COLORLESS (*)    Specific Gravity, Urine 1.003 (*)    All other components within normal limits  I-STAT CHEM 8, ED - Abnormal; Notable for the following components:   Potassium 3.4 (*)    Hemoglobin 11.9 (*)    HCT 35.0 (*)    All other components within normal limits  ETHANOL  PROTIME-INR  APTT  RAPID URINE DRUG SCREEN, HOSP PERFORMED  CBG MONITORING, ED    EKG EKG Interpretation  Date/Time:  Monday March 06 2020 13:05:02 EDT Ventricular Rate:  81 PR Interval:    QRS Duration: 91 QT Interval:  365 QTC Calculation: 424 R Axis:   -15 Text Interpretation: Sinus rhythm Borderline left axis deviation RSR' in V1 or V2, probably normal variant No significant change since last tracing Confirmed by Dorie Rank [phone]) on 03/06/2020 1:15:52 PM   Radiology CT HEAD WO CONTRAST  Result Date: 03/06/2020 CLINICAL DATA:  Headache EXAM: CT HEAD WITHOUT CONTRAST TECHNIQUE: Contiguous axial images were obtained from the base of the skull through the vertex without intravenous contrast. COMPARISON:  12/21/2019 FINDINGS: Brain: There is no acute intracranial hemorrhage, mass effect, or edema. Gray-white differentiation is preserved. There is no extra-axial fluid collection. Ventricles and sulci are within normal limits in size and configuration. Vascular: There is  atherosclerotic calcification at the skull  base. Skull: Calvarium is unremarkable. Sinuses/Orbits: Right ethmoid mucosal thickening. Orbits are unremarkable. Other: None. IMPRESSION: No acute intracranial abnormality. Electronically Signed   By: Macy Mis M.D.   On: 03/06/2020 15:34   MR BRAIN WO CONTRAST  Result Date: 03/06/2020 CLINICAL DATA:  Headache and hypertension.  Nausea. EXAM: MRI HEAD WITHOUT CONTRAST TECHNIQUE: Multiplanar, multiecho pulse sequences of the brain and surrounding structures were obtained without intravenous contrast. COMPARISON:  Head CT same day.  MRI 12/21/2019 FINDINGS: Brain: Diffusion imaging does not show any acute or subacute infarction. There are chronic small-vessel ischemic changes throughout the pons. 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 Chronic small-vessel ischemic changes affect the thalami and the hemispheric white matter. No cortical or large vessel territory infarction. No mass lesion, hemorrhage, hydrocephalus or extra-axial collection. Vascular: Major vessels at the base of the brain show flow. Skull and upper cervical spine: Negative Sinuses/Orbits: Sinuses are clear except for some inflammatory change in the posterior ethmoid region on the right. Orbits are negative. Other: None IMPRESSION: No acute finding. 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 No significant change since April of this year. Electronically Signed   By: Nelson Chimes M.D.   On: 03/06/2020 18:21    Procedures Procedures (including critical care time)  Medications Ordered in ED Medications  sodium chloride flush (NS) 0.9 % injection 3 mL (3 mLs Intravenous Given 03/06/20 1311)  LORazepam (ATIVAN) injection 0.5 mg (0.5 mg Intravenous Given 03/06/20 1615)    ED Course  I have reviewed the triage vital signs and the nursing notes.  Pertinent labs & imaging results that were available  during my care of the patient were reviewed by me and considered in my medical decision making (see chart for details).    MDM Rules/Calculators/A&P                          UE:[id number] VS: HDS/ htn XB:JYNWGNF is gathered by patietn and emr. Previous records obtained and reviewed. DDX:The patient's complaint of vertigo involves an extensive number of diagnostic and treatment options, and is a complaint that carries with it a high risk of complications, morbidity, and potential mortality. Given the large differential diagnosis, medical decision making is of high complexity. The emergent differential diagnosis for acute vertigo low includes peripheral causes such as BPPV, barotrauma, ear foreign body, Mnire's disease, infectious causes such as lip bronchitis, vestibular neuritis or Ramsay Hunt syndrome.  Other emergent causes are central such as cerebellar stroke, vertebrobasilar insufficiency, neoplastic causes, vertebral artery dissection, MS, neurosyphilis or tuberculosis, epilepsy or migraine.  Other causes include anemia, hyperviscosity syndrome, alcohol or aminoglycoside use, renal failure, hypoglycemia and thyroid disease.  Labs: I ordered reviewed and interpreted labs which include UA which is negative for infection. UDS, ETOH, PT/Aptt/INR, CMP  WNL, CBC w/o significant abnormality. Imaging: I ordered and reviewed images which included CT head . I independently visualized and interpreted all imaging. There are no acute, significant findings on today's images. EKG: NSR 81 Consults:none AOZ:HYQMVHQ here with labile HTN, Taking it multiple times daily. MRA shows chronic ischemia to PONS and Thalami- awaiting MRI. Suspect she will be able to be discharged if negative to f/u with pcp for med adjustments.sign out to Dr. Tyrone Nine     Final Clinical Impression(s) / ED Diagnoses Final diagnoses:  Vertigo  Nonintractable episodic  headache, unspecified headache type    Rx / DC Orders ED Discharge  Orders    None       Margarita Mail, PA-C 03/07/20 1140    Dorie Rank, MD 03/08/20 [phone]

## 2020-03-06 NOTE — ED Provider Notes (Signed)
74 yo F with a chief complaints of hypertension.  Patient has been checking her blood pressure very frequently over the past 48 hours.  She was concerned because it would go up and then go down.  She had up coming into the hospital for evaluation after talking with the home health nurse.  She had a CT scan and is awaiting MRI.  Plan if this is negative will be to discharge her home.  I discussed results with the patient she is concerned because no one has figured out why her blood pressures been going up and down.  Sounds like she has been taking her antihypertensives haphazardly.  She has been taking her hydralazine and then holding it and then has been taking it together with other medications.  We will have her follow-up with her family doctor.  I suggested that she check her blood pressure less often.   Deno Etienne, DO 03/06/20 2103

## 2020-03-06 NOTE — ED Notes (Signed)
Spoke to MRI. ETA about 7:30 pm before they can come get patient for MRI.

## 2020-03-06 NOTE — ED Notes (Signed)
Patient transported to MRI 

## 2020-03-06 NOTE — Discharge Instructions (Signed)
Follow up with your family doc. Return for worsening symptoms.  °

## 2020-03-06 NOTE — ED Notes (Signed)
Pure wick has been placed. Suction set to 45mmHg.  

## 2020-03-06 NOTE — ED Triage Notes (Signed)
Patient here from home reporting hypertension x3 days with headache and nausea. Reports BPs 190s/100s.

## 2020-03-09 DIAGNOSIS — I10 Essential (primary) hypertension: Secondary | ICD-10-CM | POA: Diagnosis not present

## 2020-03-09 DIAGNOSIS — E876 Hypokalemia: Secondary | ICD-10-CM | POA: Diagnosis not present

## 2020-03-09 DIAGNOSIS — L659 Nonscarring hair loss, unspecified: Secondary | ICD-10-CM | POA: Diagnosis not present

## 2020-03-09 DIAGNOSIS — R634 Abnormal weight loss: Secondary | ICD-10-CM | POA: Diagnosis not present

## 2020-03-09 DIAGNOSIS — R7309 Other abnormal glucose: Secondary | ICD-10-CM | POA: Diagnosis not present

## 2020-03-09 DIAGNOSIS — Z6825 Body mass index (BMI) 25.0-25.9, adult: Secondary | ICD-10-CM | POA: Diagnosis not present

## 2020-03-09 DIAGNOSIS — E870 Hyperosmolality and hypernatremia: Secondary | ICD-10-CM | POA: Diagnosis not present

## 2020-03-09 DIAGNOSIS — E874 Mixed disorder of acid-base balance: Secondary | ICD-10-CM | POA: Diagnosis not present

## 2020-03-10 DIAGNOSIS — R7309 Other abnormal glucose: Secondary | ICD-10-CM | POA: Diagnosis not present

## 2020-03-10 DIAGNOSIS — I10 Essential (primary) hypertension: Secondary | ICD-10-CM | POA: Diagnosis not present

## 2020-03-10 DIAGNOSIS — E874 Mixed disorder of acid-base balance: Secondary | ICD-10-CM | POA: Diagnosis not present

## 2020-03-10 DIAGNOSIS — R634 Abnormal weight loss: Secondary | ICD-10-CM | POA: Diagnosis not present

## 2020-03-10 DIAGNOSIS — Z6825 Body mass index (BMI) 25.0-25.9, adult: Secondary | ICD-10-CM | POA: Diagnosis not present

## 2020-03-10 DIAGNOSIS — L659 Nonscarring hair loss, unspecified: Secondary | ICD-10-CM | POA: Diagnosis not present

## 2020-03-15 ENCOUNTER — Encounter (HOSPITAL_COMMUNITY): Payer: Self-pay | Admitting: Emergency Medicine

## 2020-03-15 ENCOUNTER — Other Ambulatory Visit: Payer: Self-pay

## 2020-03-15 ENCOUNTER — Inpatient Hospital Stay (HOSPITAL_COMMUNITY)
Admission: EM | Admit: 2020-03-15 | Discharge: 2020-03-17 | DRG: 247 | Disposition: A | Discharge status: Home / Self Care | Payer: PPO | Attending: Family Medicine | Admitting: Family Medicine

## 2020-03-15 ENCOUNTER — Emergency Department (HOSPITAL_COMMUNITY): Payer: PPO

## 2020-03-15 DIAGNOSIS — Z8616 Personal history of COVID-19: Secondary | ICD-10-CM

## 2020-03-15 DIAGNOSIS — E7800 Pure hypercholesterolemia, unspecified: Secondary | ICD-10-CM | POA: Diagnosis not present

## 2020-03-15 DIAGNOSIS — M8580 Other specified disorders of bone density and structure, unspecified site: Secondary | ICD-10-CM | POA: Diagnosis present

## 2020-03-15 DIAGNOSIS — J449 Chronic obstructive pulmonary disease, unspecified: Secondary | ICD-10-CM | POA: Diagnosis not present

## 2020-03-15 DIAGNOSIS — I10 Essential (primary) hypertension: Secondary | ICD-10-CM | POA: Diagnosis not present

## 2020-03-15 DIAGNOSIS — E785 Hyperlipidemia, unspecified: Secondary | ICD-10-CM | POA: Diagnosis not present

## 2020-03-15 DIAGNOSIS — Z9119 Patient's noncompliance with other medical treatment and regimen: Secondary | ICD-10-CM

## 2020-03-15 DIAGNOSIS — I2510 Atherosclerotic heart disease of native coronary artery without angina pectoris: Secondary | ICD-10-CM | POA: Diagnosis present

## 2020-03-15 DIAGNOSIS — Z955 Presence of coronary angioplasty implant and graft: Secondary | ICD-10-CM

## 2020-03-15 DIAGNOSIS — Z79899 Other long term (current) drug therapy: Secondary | ICD-10-CM

## 2020-03-15 DIAGNOSIS — I249 Acute ischemic heart disease, unspecified: Secondary | ICD-10-CM | POA: Diagnosis not present

## 2020-03-15 DIAGNOSIS — N179 Acute kidney failure, unspecified: Secondary | ICD-10-CM | POA: Diagnosis not present

## 2020-03-15 DIAGNOSIS — Z886 Allergy status to analgesic agent status: Secondary | ICD-10-CM | POA: Diagnosis not present

## 2020-03-15 DIAGNOSIS — M542 Cervicalgia: Secondary | ICD-10-CM | POA: Diagnosis not present

## 2020-03-15 DIAGNOSIS — R079 Chest pain, unspecified: Secondary | ICD-10-CM | POA: Diagnosis not present

## 2020-03-15 DIAGNOSIS — Z823 Family history of stroke: Secondary | ICD-10-CM | POA: Diagnosis not present

## 2020-03-15 DIAGNOSIS — E559 Vitamin D deficiency, unspecified: Secondary | ICD-10-CM | POA: Diagnosis present

## 2020-03-15 DIAGNOSIS — E876 Hypokalemia: Secondary | ICD-10-CM | POA: Diagnosis present

## 2020-03-15 DIAGNOSIS — Z888 Allergy status to other drugs, medicaments and biological substances status: Secondary | ICD-10-CM

## 2020-03-15 DIAGNOSIS — Z8249 Family history of ischemic heart disease and other diseases of the circulatory system: Secondary | ICD-10-CM | POA: Diagnosis not present

## 2020-03-15 DIAGNOSIS — I214 Non-ST elevation (NSTEMI) myocardial infarction: Secondary | ICD-10-CM | POA: Diagnosis not present

## 2020-03-15 DIAGNOSIS — E2609 Other primary hyperaldosteronism: Secondary | ICD-10-CM | POA: Diagnosis present

## 2020-03-15 DIAGNOSIS — R42 Dizziness and giddiness: Secondary | ICD-10-CM | POA: Diagnosis not present

## 2020-03-15 DIAGNOSIS — I361 Nonrheumatic tricuspid (valve) insufficiency: Secondary | ICD-10-CM | POA: Diagnosis not present

## 2020-03-15 DIAGNOSIS — R0789 Other chest pain: Secondary | ICD-10-CM | POA: Diagnosis not present

## 2020-03-15 DIAGNOSIS — Z20822 Contact with and (suspected) exposure to covid-19: Secondary | ICD-10-CM | POA: Diagnosis not present

## 2020-03-15 LAB — CBC
HCT: 33.8 % — ABNORMAL LOW (ref 36.0–46.0)
Hemoglobin: 10.9 g/dL — ABNORMAL LOW (ref 12.0–15.0)
MCH: 27.4 pg (ref 26.0–34.0)
MCHC: 32.2 g/dL (ref 30.0–36.0)
MCV: 84.9 fL (ref 80.0–100.0)
Platelets: 178 10*3/uL (ref 150–400)
RBC: 3.98 MIL/uL (ref 3.87–5.11)
RDW: 13.8 % (ref 11.5–15.5)
WBC: 2.9 10*3/uL — ABNORMAL LOW (ref 4.0–10.5)
nRBC: 0 % (ref 0.0–0.2)

## 2020-03-15 LAB — HEPATIC FUNCTION PANEL
ALT: 13 U/L (ref 0–44)
AST: 22 U/L (ref 15–41)
Albumin: 3.7 g/dL (ref 3.5–5.0)
Alkaline Phosphatase: 52 U/L (ref 38–126)
Bilirubin, Direct: <0.1 mg/dL (ref 0.0–0.2)
Total Bilirubin: 0.9 mg/dL (ref 0.3–1.2)
Total Protein: 6.8 g/dL (ref 6.5–8.1)

## 2020-03-15 LAB — BASIC METABOLIC PANEL
Anion gap: 9 (ref 5–15)
BUN: 8 mg/dL (ref 8–23)
CO2: 27 mmol/L (ref 22–32)
Calcium: 9.2 mg/dL (ref 8.9–10.3)
Chloride: 103 mmol/L (ref 98–111)
Creatinine, Ser: 0.92 mg/dL (ref 0.44–1.00)
GFR calc Af Amer: >60 mL/min (ref >60)
GFR calc non Af Amer: >60 mL/min (ref >60)
Glucose, Bld: 113 mg/dL — ABNORMAL HIGH (ref 70–99)
Potassium: 3.4 mmol/L — ABNORMAL LOW (ref 3.5–5.1)
Sodium: 139 mmol/L (ref 135–145)

## 2020-03-15 LAB — TROPONIN I (HIGH SENSITIVITY)
Troponin I (High Sensitivity): 34 ng/L — ABNORMAL HIGH (ref <18)
Troponin I (High Sensitivity): 501 ng/L (ref <18)
Troponin I (High Sensitivity): 654 ng/L (ref <18)

## 2020-03-15 LAB — CBC WITH DIFFERENTIAL/PLATELET
Abs Immature Granulocytes: 0 10*3/uL (ref 0.00–0.07)
Basophils Absolute: 0 10*3/uL (ref 0.0–0.1)
Basophils Relative: 1 %
Eosinophils Absolute: 0.1 10*3/uL (ref 0.0–0.5)
Eosinophils Relative: 3 %
HCT: 33.5 % — ABNORMAL LOW (ref 36.0–46.0)
Hemoglobin: 10.9 g/dL — ABNORMAL LOW (ref 12.0–15.0)
Immature Granulocytes: 0 %
Lymphocytes Relative: 36 %
Lymphs Abs: 1.1 10*3/uL (ref 0.7–4.0)
MCH: 27.7 pg (ref 26.0–34.0)
MCHC: 32.5 g/dL (ref 30.0–36.0)
MCV: 85 fL (ref 80.0–100.0)
Monocytes Absolute: 0.3 10*3/uL (ref 0.1–1.0)
Monocytes Relative: 9 %
Neutro Abs: 1.5 10*3/uL — ABNORMAL LOW (ref 1.7–7.7)
Neutrophils Relative %: 51 %
Platelets: 178 10*3/uL (ref 150–400)
RBC: 3.94 MIL/uL (ref 3.87–5.11)
RDW: 13.8 % (ref 11.5–15.5)
WBC: 3 10*3/uL — ABNORMAL LOW (ref 4.0–10.5)
nRBC: 0 % (ref 0.0–0.2)

## 2020-03-15 LAB — SARS CORONAVIRUS 2 BY RT PCR (HOSPITAL ORDER, PERFORMED IN ~~LOC~~ HOSPITAL LAB): SARS Coronavirus 2: NEGATIVE

## 2020-03-15 MED ORDER — CARVEDILOL 3.125 MG PO TABS
3.1250 mg | ORAL_TABLET | Freq: Two times a day (BID) | ORAL | Status: DC
  Administered 2020-03-15 – 2020-03-17 (×4): 3.125 mg via ORAL
  Filled 2020-03-15 (×4): qty 1

## 2020-03-15 MED ORDER — CALCIUM 500 MG PO TABS: 500.0000 mg | ORAL_TABLET | Freq: Every day | ORAL | Status: DC

## 2020-03-15 MED ORDER — SODIUM CHLORIDE 0.9% FLUSH
3.0000 mL | Freq: Once | INTRAVENOUS | Status: AC
  Administered 2020-03-16: 3 mL via INTRAVENOUS

## 2020-03-15 MED ORDER — LISINOPRIL 10 MG PO TABS: 10.0000 mg | ORAL_TABLET | Freq: Every day | ORAL | Status: DC

## 2020-03-15 MED ORDER — ASPIRIN 81 MG PO CHEW
324.0000 mg | CHEWABLE_TABLET | Freq: Once | ORAL | Status: AC
  Administered 2020-03-15: 324 mg via ORAL
  Filled 2020-03-15: qty 4

## 2020-03-15 MED ORDER — ASPIRIN 81 MG PO CHEW
324.0000 mg | CHEWABLE_TABLET | ORAL | Status: DC
Start: 2020-03-15 — End: 2020-03-15

## 2020-03-15 MED ORDER — ADULT MULTIVITAMIN W/MINERALS CH
1.0000 | ORAL_TABLET | Freq: Every day | ORAL | Status: DC
  Administered 2020-03-15 – 2020-03-17 (×3): 1 via ORAL
  Filled 2020-03-15 (×3): qty 1

## 2020-03-15 MED ORDER — HEPARIN (PORCINE) 25000 UT/250ML-% IV SOLN
800.0000 [IU]/h | INTRAVENOUS | Status: DC
  Administered 2020-03-15: 800 [IU]/h via INTRAVENOUS
  Filled 2020-03-15: qty 250

## 2020-03-15 MED ORDER — MECLIZINE HCL 25 MG PO TABS: 25.0000 mg | ORAL_TABLET | Freq: Three times a day (TID) | ORAL | Status: DC | PRN

## 2020-03-15 MED ORDER — HEPARIN BOLUS VIA INFUSION
4000.0000 [IU] | Freq: Once | INTRAVENOUS | Status: AC
  Administered 2020-03-15: 4000 [IU] via INTRAVENOUS
  Filled 2020-03-15: qty 4000

## 2020-03-15 MED ORDER — SODIUM CHLORIDE 0.9% FLUSH
3.0000 mL | Freq: Two times a day (BID) | INTRAVENOUS | Status: DC
  Administered 2020-03-16: 3 mL via INTRAVENOUS

## 2020-03-15 MED ORDER — ASPIRIN EC 81 MG PO TBEC: 81.0000 mg | DELAYED_RELEASE_TABLET | Freq: Every day | ORAL | Status: DC

## 2020-03-15 MED ORDER — NITROGLYCERIN 0.4 MG SL SUBL
0.4000 mg | SUBLINGUAL_TABLET | SUBLINGUAL | Status: DC | PRN
  Filled 2020-03-15: qty 1

## 2020-03-15 MED ORDER — ATORVASTATIN CALCIUM 40 MG PO TABS
40.0000 mg | ORAL_TABLET | Freq: Every day | ORAL | Status: DC
  Administered 2020-03-15 – 2020-03-17 (×4): 40 mg via ORAL
  Filled 2020-03-15 (×4): qty 1

## 2020-03-15 MED ORDER — LOSARTAN POTASSIUM 50 MG PO TABS
50.0000 mg | ORAL_TABLET | Freq: Every day | ORAL | Status: DC
  Administered 2020-03-15 – 2020-03-17 (×3): 50 mg via ORAL
  Filled 2020-03-15 (×3): qty 1

## 2020-03-15 MED ORDER — ONDANSETRON HCL 4 MG PO TABS
4.0000 mg | ORAL_TABLET | Freq: Once | ORAL | Status: AC
  Administered 2020-03-15: 4 mg via ORAL
  Filled 2020-03-15: qty 1

## 2020-03-15 MED ORDER — NITROGLYCERIN 0.4 MG/HR TD PT24
0.4000 mg | MEDICATED_PATCH | Freq: Every day | TRANSDERMAL | Status: DC
  Administered 2020-03-15: 0.4 mg via TRANSDERMAL
  Filled 2020-03-15 (×3): qty 1

## 2020-03-15 MED ORDER — HYDROCHLOROTHIAZIDE 12.5 MG PO CAPS
12.5000 mg | ORAL_CAPSULE | Freq: Every day | ORAL | Status: DC
  Administered 2020-03-15: 12.5 mg via ORAL
  Filled 2020-03-15: qty 1

## 2020-03-15 MED ORDER — MAGNESIUM GLUCONATE 500 MG PO TABS: 500.0000 mg | ORAL_TABLET | Freq: Every day | ORAL | Status: DC

## 2020-03-15 MED ORDER — ASPIRIN 300 MG RE SUPP: 300.0000 mg | RECTAL | Status: DC

## 2020-03-15 MED ORDER — POTASSIUM CHLORIDE CRYS ER 20 MEQ PO TBCR
40.0000 meq | EXTENDED_RELEASE_TABLET | Freq: Once | ORAL | Status: AC
  Administered 2020-03-15: 40 meq via ORAL
  Filled 2020-03-15: qty 2

## 2020-03-15 MED ORDER — MAGNESIUM 30 MG PO TABS: 30.0000 mg | ORAL_TABLET | Freq: Every day | ORAL | Status: DC

## 2020-03-15 MED ORDER — NITROGLYCERIN 0.4 MG SL SUBL
0.4000 mg | SUBLINGUAL_TABLET | SUBLINGUAL | Status: DC | PRN
  Administered 2020-03-15: 0.4 mg via SUBLINGUAL
  Filled 2020-03-15: qty 1

## 2020-03-15 MED ORDER — VITAMIN D 25 MCG (1000 UNIT) PO TABS: 1000.0000 [IU] | ORAL_TABLET | Freq: Every day | ORAL | Status: DC

## 2020-03-15 MED ORDER — ONDANSETRON HCL 4 MG/2ML IJ SOLN
4.0000 mg | Freq: Four times a day (QID) | INTRAMUSCULAR | Status: DC | PRN
  Administered 2020-03-16: 4 mg via INTRAVENOUS
  Filled 2020-03-15: qty 2

## 2020-03-15 MED ORDER — ACETAMINOPHEN 325 MG PO TABS
650.0000 mg | ORAL_TABLET | ORAL | Status: DC | PRN
  Filled 2020-03-15: qty 2

## 2020-03-15 NOTE — Consult Note (Signed)
Cardiology Consult note:   Patient ID: [name] MRN: [id number]; DOB: 1946/06/07   Admission date: 03/15/2020  Primary Care Provider: Lucianne Lei, MD [facility] Cardiologist: Dr. Martinique   Chief Complaint:  Chest pain   Patient Profile:   Anne [name] is a 74 y.o. female with hypertension, hyperlipidemia, tachycardia and vertigo who is being seen today for the evaluation of Chest pain at the request of No ref. provider found.  Patient has longstanding history of uncontrolled hypertension with multiple drug intolerance.  Last seen by Dr. Martinique in 2015.  Admitted April 2021 with headache and nausea.  Found to have vertigo.  MRI shows No acute or focal abnormality to explain the patient's symptoms on a background of periventricular and subcortical white matter changes bilaterally are moderately advanced for age.  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  Previously tried "potassium chloride" leading to hair loss.  Patient reports longstanding history of tachycardia which resolved with bearing down like having a bowel movement.  Recently worsened.  Occurring 3-4 times per week.  Seen in ER 03/06/20 for headache and nausea in setting of elevated blood pressure.  MR of brain IMPRESSION: No acute finding. 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 No significant change since April of this year.  History of Present Illness:   Anne [name] was in usual state of health up until this morning at 4 AM woke up with severe back pain radiating to lower sternal area.  It felt like someone pressing.  Then it radiated to her shoulder and left arm.  She had associated nausea and vomiting.  However felt like room spinning as well.  EMS was called and given sublingual nitroglycerin x1.  Her pain eventually resolved.  Currently chest pain-free.  She reports not taking her  antihypertensive regimen for greater than 1 week.  She is trying nature of her medications.  Potassium 3.4.  WBC 2.9.  RBC 3.94.  Hemoglobin 10.9.  High-sensitivity troponin 34.  Chest x-ray clear.  Patient reported received walk outside yesterday for quite a while without chest pain and shortness of breath.  Reports of worsening tachycardia which resolves with bearing down.  No orthopnea, PND, syncope, lower extremity edema or melena.  Mother had history of CHF. Younger brother and sister diagnosed with CAD and had stenting Another younger brother had a stroke at age 93  Past Medical History:  Diagnosis Date  . Complication of anesthesia    slow to wake up  . Diverticulosis   . Hiatal hernia   . Hypercholesterolemia   . Hypertension   . Osteopenia   . Vitamin D deficiency     Past Surgical History:  Procedure Laterality Date  . ABDOMINAL HYSTERECTOMY     Partial  . fibrocystic breast excision    . LAPAROSCOPIC ILEOCECECTOMY  07/07/2015  . LAPAROTOMY N/A 07/07/2015   Procedure: EXPLORATORY LAPAROTOMY WITH ILEOCECECTOMY ;  Surgeon: Donnie Mesa, MD;  Location: [place];  Service: General;  Laterality: N/A;  . TUBAL LIGATION       Medications Prior to Admission: Prior to Admission medications   Medication Sig Start Date End Date Taking? Authorizing Provider  Calcium 500 MG tablet Take 500 mg by mouth daily.  Patient not taking: Reported on 03/06/2020    [provider]  cholecalciferol (VITAMIN D) 1000 UNITS tablet Take 1,000 Units by mouth daily.    [provider]  GARLIC PO  Take 1 tablet by mouth daily.    [provider]  hydrALAZINE (APRESOLINE) 25 MG tablet Take 1 tablet (25 mg total) by mouth 2 (two) times daily. 12/22/19   Patrecia Pour, MD  magnesium 30 MG tablet Take 30 mg by mouth daily.    [provider]  magnesium gluconate (MAGONATE) 500 MG tablet Take 500 mg by mouth daily. Patient not taking: Reported on 03/06/2020    [provider]  meclizine (ANTIVERT) 25 MG tablet Take 1 tablet (25 mg total) by mouth 3 (three) times daily as needed for dizziness. 12/22/19   Patrecia Pour, MD  Multiple Vitamins-Minerals (MULTIVITAMIN WITH MINERALS) tablet Take 1 tablet by mouth daily.    [provider]  OVER THE COUNTER MEDICATION Take 1 tablet by mouth 3 (three) times daily. "BP monitor" Patient not taking: Reported on 03/06/2020    [provider]  triamterene-hydrochlorothiazide (MAXZIDE-25) 37.5-25 MG tablet Take 1 tablet by mouth daily. 10/14/19   [provider]     Allergies:    Allergies  Allergen Reactions  . Amiloride Other (See Comments)    Hair loss  . Anesthetics, Amide Other (See Comments)    Lethargic and very slow reanimation lasting a full day or so  . Other Other (See Comments)    Very hard to awake from anaesthetic with surgery.  Diona Fanti [Aspirin] Other (See Comments)    Causes stomach issues  . Diltiazem Hcl Nausea And Vomiting    Hospitalized when it occurred   . Eplerenone Other (See Comments)    Hair loss  . Indomethacin Other (See Comments)    esophagitis  . Naproxen Other (See Comments)    Gastric intolerance  . Norvasc [Amlodipine Besylate] Swelling    Severe edema / Headaches   . Tenex [Guanfacine Hcl] Other (See Comments)    Chronic insomnia   . Triamterene-Hctz Other (See Comments)    Acute renal failure, hypokalemia   . Hytrin [Terazosin Hcl] Other (See Comments)    Pt states she can not take this med but can not relate any side effect or incident that occurred  . Lisinopril Other (See Comments)    Unknown, pt says she "can't take it", cough/hypotension and thirst   Social History:   Social History   Socioeconomic History  . Marital status: Married    Spouse name: Not on file  . Number of children: 2  . Years of education: Not on file  . Highest education level: Not on file  Occupational History  . Occupation: retired  Tobacco Use  .  Smoking status: Never Smoker  . Smokeless tobacco: Never Used  Substance and Sexual Activity  . Alcohol use: Yes    Comment: 3 times a year  . Drug use: No  . Sexual activity: Yes    Birth control/protection: Surgical  Other Topics Concern  . Not on file  Social History Narrative  . Not on file   Social Determinants of Health   Financial Resource Strain:   . Difficulty of Paying Living Expenses:   Food Insecurity:   . Worried About Charity fundraiser in the Last Year:   . Arboriculturist in the Last Year:   Transportation Needs:   . Film/video editor (Medical):   Marland Kitchen Lack of Transportation (Non-Medical):   Physical Activity:   . Days of Exercise per Week:   . Minutes of Exercise per Session:   Stress:   . Feeling  of Stress :   Social Connections:   . Frequency of Communication with Friends and Family:   . Frequency of Social Gatherings with Friends and Family:   . Attends Religious Services:   . Active Member of Clubs or Organizations:   . Attends Archivist Meetings:   Marland Kitchen Marital Status:   Intimate Partner Violence:   . Fear of Current or Ex-Partner:   . Emotionally Abused:   Marland Kitchen Physically Abused:   . Sexually Abused:     Family History:   The patient's family history includes Hypertension in her mother; Stroke in her brother and mother. There is no history of Cancer, Alcohol abuse, Early death, Hearing loss, Heart disease, Hyperlipidemia, or Kidney disease.    ROS:  Please see the history of present illness.  All other ROS reviewed and negative.     Physical Exam/Data:   Vitals:   03/15/20 0937 03/15/20 0947 03/15/20 0955 03/15/20 1015  BP: (!) 184/86 (!) 182/87 (!) 151/84 (!) 155/82  Pulse: 75 75 67 72  Resp: 14 14 15  (!) 23  Temp:      TempSrc:      SpO2: 100% 100% 99% 99%    Intake/Output Summary (Last 24 hours) at 03/15/2020 1123 Last data filed at 03/15/2020 1103 Gross per 24 hour  Intake --  Output 1000 ml  Net -1000 ml   Last 3  Weights 12/21/2019 07/07/2015 07/07/2015  Weight (lbs) 147 lb 14.9 oz 105 lb 3.2 oz 138 lb  Weight (kg) 67.1 kg 47.718 kg 62.596 kg     There is no height or weight on file to calculate BMI.  General:  Well nourished, well developed, in no acute distress HEENT: normal Lymph: no adenopathy Neck: no JVD Endocrine:  No thryomegaly Vascular: No carotid bruits; FA pulses 2+ bilaterally without bruits  Cardiac:  normal S1, S2; RRR; no murmur  Lungs:  clear to auscultation bilaterally, no wheezing, rhonchi or rales  Abd: soft, nontender, no hepatomegaly  Ext: no edema Musculoskeletal:  No deformities, BUE and BLE strength normal and equal Skin: warm and dry  Neuro:  CNs 2-12 intact, no focal abnormalities noted Psych:  Normal affect    EKG:  The ECG that was done today  was personally reviewed and demonstrates NSR  Relevant CV Studies:  Echo 09/2011 Left ventricle: The cavity size was normal. Wall thickness  was normal. Systolic function was normal. The estimated  ejection fraction was in the range of 60% to 65%. Wall  motion was normal; there were no regional wall motion  abnormalities. Left ventricular diastolic function  parameters were normal.  - Pulmonary arteries: PA peak pressure: 76mm Hg (S).    Laboratory Data:  High Sensitivity Troponin:   Recent Labs  Lab 03/15/20 0602  TROPONINIHS 34*      Chemistry Recent Labs  Lab 03/15/20 0602  NA 139  K 3.4*  CL 103  CO2 27  GLUCOSE 113*  BUN 8  CREATININE 0.92  CALCIUM 9.2  GFRNONAA >60  GFRAA >60  ANIONGAP 9    Recent Labs  Lab 03/15/20 0602  PROT 6.8  ALBUMIN 3.7  AST 22  ALT 13  ALKPHOS 52  BILITOT 0.9   Hematology Recent Labs  Lab 03/15/20 0602  WBC 3.0*  2.9*  RBC 3.94  3.98  HGB 10.9*  10.9*  HCT 33.5*  33.8*  MCV 85.0  84.9  MCH 27.7  27.4  MCHC 32.5  32.2  RDW 13.8  13.8  PLT 178  178   Radiology/Studies:  DG Chest 2 View  Result Date: 03/15/2020 CLINICAL DATA:   Chest pain EXAM: CHEST - 2 VIEW COMPARISON:  07/31/2018 FINDINGS: Normal heart size and mediastinal contours. No acute infiltrate or edema. No effusion or pneumothorax. Thoracic scoliosis. No acute osseous findings. IMPRESSION: No active cardiopulmonary disease. Electronically Signed   By: Monte Fantasia M.D.   On: 03/15/2020 06:26    HEAR Score (for undifferentiated chest pain):  HEAR Score: 4     Assessment and Plan:   1.  Chest pain with minimally elevated troponin -Woke up from sleep with severe back pain radiating to her lower sternal area then it radiated to left shoulder and left arm.  Associated with nausea, vomiting and room spinning in setting of history of vertigo.  Given sublingual nitroglycerin with eventual resolution of pain.  EKG without acute changes.  High-sensitivity troponin minimally up at 34.   -Her presentation does not sound like ACS>> wait repeat troponin. -Differential includes aortic dissection  2.  Uncontrolled hypertension -Longstanding history.  Intolerance to multiple antihypertensive.  She is not taking her Maxide and hydralazine for greater than 1 week.  Trying initial therapy. -Blood pressure elevated -Given history of persistent elevated high blood pressure and hypokalemia rule out endocrine disorder or renal artery stenosis -Recommended teaching service admission for further evaluation  3.  Hypokalemia -Reports longstanding history.  Reports hair loss on potassium chloride supplement.  4.  History of vertigo  5.  Tachycardia -Patient reports longstanding history of tachycardia which resolved with bearing down like having a bowel movement.  Recently worsened.  Occurring 3-4 times per week. ? SVT.  Monitor earlier this year without arrhythmia.  -May add beta-blocker  Dr. Martinique to see.   For questions or updates, please contact [place] Please consult [email or web address] for contact info under     Jarrett Soho, PA  03/15/2020 11:23 AM

## 2020-03-15 NOTE — Progress Notes (Signed)
ANTICOAGULATION CONSULT NOTE - Initial Consult  Pharmacy Consult for Heparin Indication: chest pain/ACS  Allergies  Allergen Reactions  . Amiloride Other (See Comments)    Hair loss  . Anesthetics, Amide Other (See Comments)    Lethargic and very slow reanimation lasting a full day or so  . Other Other (See Comments)    Very hard to awake from anaesthetic with surgery.  Diona Fanti [Aspirin] Other (See Comments)    Causes stomach issues  . Diltiazem Hcl Nausea And Vomiting    Hospitalized when it occurred   . Eplerenone Other (See Comments)    Hair loss  . Indomethacin Other (See Comments)    esophagitis  . Naproxen Other (See Comments)    Gastric intolerance  . Norvasc [Amlodipine Besylate] Swelling    Severe edema / Headaches   . Tenex [Guanfacine Hcl] Other (See Comments)    Chronic insomnia   . Triamterene-Hctz Other (See Comments)    Acute renal failure, hypokalemia   . Hytrin [Terazosin Hcl] Other (See Comments)    Pt states she can not take this med but can not relate any side effect or incident that occurred  . Lisinopril Other (See Comments)    Unknown, pt says she "can't take it", cough/hypotension and thirst    Patient Measurements: Height: 5\' 5"  (165.1 cm) Weight: 67.1 kg (147 lb 14.9 oz) IBW/kg (Calculated) : 57 Heparin Dosing Weight: 147lb  Vital Signs: Temp: 98.3 F (36.8 C) (07/07 0558) Temp Source: Oral (07/07 0558) BP: 155/82 (07/07 1015) Pulse Rate: 72 (07/07 1015)  Labs: Recent Labs    03/15/20 0602 03/15/20 1020  HGB 10.9*  10.9*  --   HCT 33.5*  33.8*  --   PLT 178  178  --   CREATININE 0.92  --   TROPONINIHS 34* 501*    Estimated Creatinine Clearance: 49.7 mL/min (by C-G formula based on SCr of 0.92 mg/dL).   Medical History: Past Medical History:  Diagnosis Date  . Complication of anesthesia    slow to wake up  . Diverticulosis   . Hiatal hernia   . Hypercholesterolemia   . Hypertension   . Osteopenia   . Vitamin D  deficiency     Medications:  Scheduled:  . atorvastatin  40 mg Oral Daily  . heparin  4,000 Units Intravenous Once  . nitroGLYCERIN  0.4 mg Transdermal Daily  . sodium chloride flush  3 mL Intravenous Once  . sodium chloride flush  3 mL Intravenous Q12H    Assessment: Patient is a 74yo female who presents with chest pain. Pharmacy has been asked to dose heparin. Patient was not on anticoag PTA.  Troponin 501, Hg 10.9, HCT 33.5, PLT 178  Goal of Therapy:  Heparin level 0.3-0.7 units/ml Monitor platelets by anticoagulation protocol: Yes   Plan:  Give 4000 units bolus x 1  Then give 800 units/hr Check 8 hour heparin level Monitor daily heparin level & CBC  Beckey Rutter 03/15/2020,1:00 PM

## 2020-03-15 NOTE — ED Provider Notes (Signed)
[facility] EMERGENCY DEPARTMENT Provider Note   CSN: [id number] Arrival date & time: 03/15/20  0542     History Chief Complaint  Patient presents with  . Chest Pain    Anne [name] is a 74 y.o. female.  Anne [name] is a 74 y/o AA female with a PMHx of hypertension, HLD, hypokalemia who was brought in by EMS for chest pressure that radiated to her right arm. The patient was sleeping and was awoken with the chest pressure at 0400 this am. She sat up in bed and felt dizzy and had one episode of vomiting that was clear. Denies any blood in her vomit. She states the pain is deep and felt as though it started in her back. She describes the pain as constant and relieved by nitroglycerin given to her by EMS. She denies anything worsening the pain. She describes the arm pain as throbbing. She denies having this pain the past. She denies chest pain being reproducible when she pushes on the area.  She denies any associated symptoms of fever, chills, cough, SOB, abdominal pain, diarrhea, leg pain.   Anne [name] had no other complaints at my time of examination  The history is provided by the patient.  Chest Pain Pain location:  Substernal area Pain quality: pressure   Pain radiates to:  L shoulder and L arm (left hand) Pain severity:  No pain Duration:  4 hours Timing:  Constant Progression:  Partially resolved Chronicity:  New Context: not breathing, not lifting and not movement   Context comment:  Sleeping Relieved by:  Nitroglycerin Worsened by:  Nothing Ineffective treatments:  Certain positions and rest Associated symptoms: back pain, dizziness, nausea and vomiting   Associated symptoms: no abdominal pain, no cough, no fever, no headache and no shortness of breath   Risk factors: hypertension   Risk factors: no smoking     HPI: A 74 year old patient with a history of hypertension presents for evaluation of chest pain. Initial onset of pain was more than 6 hours  ago. The patient's chest pain is described as heaviness/pressure/tightness and is not worse with exertion. The patient's chest pain is middle- or left-sided, is not well-localized, is not sharp and does not radiate to the arms/jaw/neck. The patient does not complain of nausea and denies diaphoresis. The patient has a family history of coronary artery disease in a first-degree relative with onset less than age 85. The patient has no history of stroke, has no history of peripheral artery disease, has not smoked in the past 90 days, denies any history of treated diabetes, has no history of hypercholesterolemia and does not have an elevated BMI (>=30).   Past Medical History:  Diagnosis Date  . Complication of anesthesia    slow to wake up  . Diverticulosis   . Hiatal hernia   . Hypercholesterolemia   . Hypertension   . Osteopenia   . Vitamin D deficiency     Patient Active Problem List   Diagnosis Date Noted  . Vertigo 12/21/2019  . Weight loss, non-intentional 12/21/2019  . Hypertensive urgency 12/21/2019  . Renal insufficiency 12/21/2019  . Pancreatic cyst 12/21/2019  . History of diabetes mellitus 12/21/2019  . Small bowel obstruction ([place]) 07/07/2015  . SBO (small bowel obstruction) ([place]) 07/07/2015  . 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 Back pain 10/21/2014  . Routine general medical examination at a health care facility 07/12/2014  . Hearing loss 09/27/2012  . Other screening mammogram 09/27/2012  .  Pure hypercholesterolemia 09/25/2012  . Hypokalemia 08/31/2012  . HTN (hypertension) 10/01/2011    Past Surgical History:  Procedure Laterality Date  . ABDOMINAL HYSTERECTOMY     Partial  . fibrocystic breast excision    . LAPAROSCOPIC ILEOCECECTOMY  07/07/2015  . LAPAROTOMY N/A 07/07/2015   Procedure: EXPLORATORY LAPAROTOMY WITH ILEOCECECTOMY ;  Surgeon: Donnie Mesa, MD;  Location: [facility];  Service: General;  Laterality: N/A;  . TUBAL LIGATION       OB History     No obstetric history on file.     Family History  Problem Relation Age of Onset  . Stroke Mother   . Hypertension Mother   . Stroke Brother   . Cancer Neg Hx   . Alcohol abuse Neg Hx   . Early death Neg Hx   . Hearing loss Neg Hx   . Heart disease Neg Hx   . Hyperlipidemia Neg Hx   . Kidney disease Neg Hx     Social History   Tobacco Use  . Smoking status: Never Smoker  . Smokeless tobacco: Never Used  Substance Use Topics  . Alcohol use: Yes    Comment: 3 times a year  . Drug use: No    Home Medications Prior to Admission medications   Medication Sig Start Date End Date Taking? Authorizing Provider  Calcium 500 MG tablet Take 500 mg by mouth daily.  Patient not taking: Reported on 03/06/2020    [provider]  cholecalciferol (VITAMIN D) 1000 UNITS tablet Take 1,000 Units by mouth daily.    [provider]  GARLIC PO Take 1 tablet by mouth daily.    [provider]  hydrALAZINE (APRESOLINE) 25 MG tablet Take 1 tablet (25 mg total) by mouth 2 (two) times daily. 12/22/19   Patrecia Pour, MD  magnesium 30 MG tablet Take 30 mg by mouth daily.    [provider]  magnesium gluconate (MAGONATE) 500 MG tablet Take 500 mg by mouth daily. Patient not taking: Reported on 03/06/2020    [provider]  meclizine (ANTIVERT) 25 MG tablet Take 1 tablet (25 mg total) by mouth 3 (three) times daily as needed for dizziness. 12/22/19   Patrecia Pour, MD  Multiple Vitamins-Minerals (MULTIVITAMIN WITH MINERALS) tablet Take 1 tablet by mouth daily.    [provider]  OVER THE COUNTER MEDICATION Take 1 tablet by mouth 3 (three) times daily. "BP monitor" Patient not taking: Reported on 03/06/2020    [provider]  triamterene-hydrochlorothiazide (MAXZIDE-25) 37.5-25 MG tablet Take 1 tablet by mouth daily. 10/14/19   [provider]    Allergies    Amiloride; Anesthetics, amide; Other; Asa [aspirin]; Diltiazem hcl;  Eplerenone; Indomethacin; Naproxen; Norvasc [amlodipine besylate]; Tenex [guanfacine hcl]; Triamterene-hctz; Evans Lance [terazosin hcl]; and Lisinopril  Review of Systems   Review of Systems  Constitutional: Negative for chills and fever.  Respiratory: Negative for cough and shortness of breath.   Cardiovascular: Positive for chest pain.  Gastrointestinal: Positive for nausea and vomiting. Negative for abdominal pain and diarrhea.  Genitourinary: Negative for difficulty urinating.  Musculoskeletal: Positive for back pain. Negative for neck pain.  Neurological: Positive for dizziness. Negative for headaches.  All other systems reviewed and are negative.   Physical Exam Updated Vital Signs BP (!) 155/82   Pulse 72   Temp 98.3 F (36.8 C) (Oral)   Resp (!) 23   SpO2 99%   Physical Exam Vitals and nursing note reviewed.  Constitutional:  General: She is not in acute distress.    Appearance: She is well-developed. She is not ill-appearing, toxic-appearing or diaphoretic.  HENT:     Head: Atraumatic.  Eyes:     Pupils: Pupils are equal, round, and reactive to light.  Cardiovascular:     Rate and Rhythm: Normal rate and regular rhythm.     Heart sounds: Normal heart sounds. No murmur heard.  No friction rub. No gallop.   Pulmonary:     Effort: Pulmonary effort is normal. No accessory muscle usage or respiratory distress.     Breath sounds: Normal breath sounds. No stridor. No decreased breath sounds, wheezing or rhonchi.  Chest:     Chest wall: No deformity.  Abdominal:     Palpations: Abdomen is soft. There is no hepatomegaly.     Tenderness: There is no abdominal tenderness. There is no guarding or rebound.  Musculoskeletal:        General: Normal range of motion.     Cervical back: Normal range of motion and neck supple.     Right lower leg: Edema (trace) present.     Left lower leg: Edema (trace) present.     Comments: No tenderness to palpation of the patient's sternum    Neurological:     General: No focal deficit present.     Mental Status: She is alert and oriented to person, place, and time.  Psychiatric:        Mood and Affect: Mood normal.        Behavior: Behavior normal.    Bilateral blood pressures checked, equal bilaterally at 180.   ED Results / Procedures / Treatments   Labs (all labs ordered are listed, but only abnormal results are displayed) Labs Reviewed  BASIC METABOLIC PANEL - Abnormal; Notable for the following components:      Result Value   Potassium 3.4 (*)    Glucose, Bld 113 (*)    All other components within normal limits  CBC - Abnormal; Notable for the following components:   WBC 2.9 (*)    Hemoglobin 10.9 (*)    HCT 33.8 (*)    All other components within normal limits  CBC WITH DIFFERENTIAL/PLATELET - Abnormal; Notable for the following components:   WBC 3.0 (*)    Hemoglobin 10.9 (*)    HCT 33.5 (*)    Neutro Abs 1.5 (*)    All other components within normal limits  TROPONIN I (HIGH SENSITIVITY) - Abnormal; Notable for the following components:   Troponin I (High Sensitivity) 34 (*)    All other components within normal limits  TROPONIN I (HIGH SENSITIVITY) - Abnormal; Notable for the following components:   Troponin I (High Sensitivity) 501 (*)    All other components within normal limits  SARS CORONAVIRUS 2 BY RT PCR (HOSPITAL ORDER, [facility])  HEPATIC FUNCTION PANEL  TROPONIN I (HIGH SENSITIVITY)  TROPONIN I (HIGH SENSITIVITY)    EKG EKG Interpretation  Date/Time:  Wednesday March 15 2020 05:50:39 EDT Ventricular Rate:  64 PR Interval:  184 QRS Duration: 86 QT Interval:  400 QTC Calculation: 412 R Axis:   -10 Text Interpretation: Normal sinus rhythm Possible Anterior infarct , age undetermined Abnormal ECG No significant change since last tracing Confirmed by Gareth Morgan [phone]) on 03/15/2020 7:46:01 AM   Radiology DG Chest 2 View  Result Date:  03/15/2020 CLINICAL DATA:  Chest pain EXAM: CHEST - 2 VIEW COMPARISON:  07/31/2018 FINDINGS: Normal heart  size and mediastinal contours. No acute infiltrate or edema. No effusion or pneumothorax. Thoracic scoliosis. No acute osseous findings. IMPRESSION: No active cardiopulmonary disease. Electronically Signed   By: Monte Fantasia M.D.   On: 03/15/2020 06:26    Procedures Procedures (including critical care time)  Medications Ordered in ED Medications  sodium chloride flush (NS) 0.9 % injection 3 mL (has no administration in time range)  nitroGLYCERIN (NITROSTAT) SL tablet 0.4 mg (0.4 mg Sublingual Given 03/15/20 0943)  nitroGLYCERIN (NITRODUR - Dosed in mg/24 hr) patch 0.4 mg (has no administration in time range)  sodium chloride flush (NS) 0.9 % injection 3 mL (has no administration in time range)  atorvastatin (LIPITOR) tablet 40 mg (has no administration in time range)  aspirin chewable tablet 324 mg (324 mg Oral Given 03/15/20 0937)  ondansetron (ZOFRAN) tablet 4 mg (4 mg Oral Given 03/15/20 0102)    ED Course  I have reviewed the triage vital signs and the nursing notes.  Pertinent labs & imaging results that were available during my care of the patient were reviewed by me and considered in my medical decision making (see chart for details).  Clinical Course as of Mar 16 1255  Wed Mar 15, 2020  0744 Troponin I (High Sensitivity)(!): 34 [VK]  [phone] Troponin I (High Sensitivity)(!) [VK]    Clinical Course User Index [VK] Riesa Pope, MD   [phone] Discussed patient's aspirin allergy with her, she notes vomiting with ingestion. Will order zofran 4mg  tab prior to administering aspirin. Also Dr. Billy Fischer and I discussed with patient plan to admit to either cardiology or hospitalist group for further cardiac workup due to suspected acute coronary syndrome. She agrees with plan.   1209 Second troponin I of 500, Heparin ordered per pharmacy and cardiology updated. Repeat EKG ordered.  Cardiology PA discussed plan to take patient to Cath Lab tomorrow.   MDM Rules/Calculators/A&P HEAR Score: 4                         Anne [name] is a 74 y/o AA female who was BIB EMS for chest pressure and throbbing left arm pain that awoke her from sleep this morning. The chest pressure was relieved when the patient was given nitroglycerin by EMS. Her EKG revealed no changes from prior EKG. Troponin I was elevated at 34 (prior troponin of 5 from 12/21/19). Chest X-ray displayed normal heart size and mediastinal contours. No acute cardiopulmonary disease. Suspected acute coronary, will consult cardiology.   Chest Pressure - Pressure relieved by nitroglycerin - Troponin I of 34. Prior troponin of 5 from 12/21/19.  - EKG unchanged from prior EKG, no acute changes noted - Chest x-ray revealed no dilation of the aorta, lowering my suspicion for potential dissection.  - Patient's clinical picture of acute chest pressure/back pain radiating down her arm and elevated troponin I, suspected acute coronary syndrome. Will consult cardiology for possible admission.  - Serial troponins ordered for every 2 hours. If next troponin is elevated, will consider beginning heparin - Nitro .4mg  tab ordered PRN for any continued chest pressure/pain - Asprin 324mg  ordered  - Patient notes upset stomach with aspirin, zofran 4 mg ordered to be given prior to aspirin  Final Clinical Impression(s) / ED Diagnoses Final diagnoses:  ACS (acute coronary syndrome) ([place])  NSTEMI (non-ST elevated myocardial infarction) ([place])    Rx / DC Orders ED Discharge Orders    None       Denielle Bayard,  MD 03/15/20 1504    Gareth Morgan, MD 03/16/20 2251

## 2020-03-15 NOTE — Progress Notes (Signed)
Hs troponin 34>>501.  Cancel Coronary Ct. Plan cath tomorrow (on room for add on today).  The patient understands that risks include but are not limited to stroke (1 in 1000), death (1 in 36), kidney failure [usually temporary] (1 in 500), bleeding (1 in 200), allergic reaction [possibly serious] (1 in 200), and agrees to proceed.   Start IV heparin, nitro patch (if ongoing pain>> change to nitro gtt), and statin. She already got ASA 324mg  today. Will give ASA 81mg  qd starting tomorrow. Get Echo today. Cycle enzyme.   BP control per admitting team. She is resistant to starting medications. See consult note.

## 2020-03-15 NOTE — H&P (Signed)
History and Physical    Anne [name] MPN:[id number] DOB: [birth year] DOA: 03/15/2020  PCP: Lucianne Lei, MD (Confirm with patient/family/NH records and if not entered, this has to be entered at [facility] point of entry) Patient coming from: Home  I have personally briefly reviewed patient's old medical records in [place]  Chief Complaint: Chest pain  HPI: Anne [name] is a 74 y.o. female with medical history significant of HTN, HLD on diet modifications, presented with new onset chest pain.  Symptoms started this morning, pressure-like chest pain woke patient up, intensity was 7-8 over 10, starting from the back then gradually shifted to retrosternal, then with the bed to left shoulder and left upper arm.  Constant for about half an hour, until MS arrived and given 1 sublingual nitroglycerin which relieved the pain.  However, later experienced 2-3 more episodes similar intensity and locations associated with short of breath, denied any palpitations or feeling of nauseous vomit or sweating. ED Course: Trop 34>501, CT shows no significant ST-T changes.  Calcium 3.4, creatinine 2.9.  X-ray negative for acute infiltrates  Review of Systems: As per HPI otherwise 10 point review of systems negative.    Past Medical History:  Diagnosis Date   Complication of anesthesia    slow to wake up   Diverticulosis    Hiatal hernia    Hypercholesterolemia    Hypertension    Osteopenia    Vitamin D deficiency     Past Surgical History:  Procedure Laterality Date   ABDOMINAL HYSTERECTOMY     Partial   fibrocystic breast excision     LAPAROSCOPIC ILEOCECECTOMY  07/07/2015   LAPAROTOMY N/A 07/07/2015   Procedure: EXPLORATORY LAPAROTOMY WITH ILEOCECECTOMY ;  Surgeon: Donnie Mesa, MD;  Location: [facility];  Service: General;  Laterality: N/A;   TUBAL LIGATION       reports that she has never smoked. She has never used smokeless tobacco. She reports current alcohol use. She reports  that she does not use drugs.  Allergies  Allergen Reactions   Amiloride Other (See Comments)    Hair loss   Anesthetics, Amide Other (See Comments)    Lethargic and very slow reanimation lasting a full day or so   Other Other (See Comments)    Very hard to awake from anaesthetic with surgery.   Asa [Aspirin] Other (See Comments)    Causes stomach issues   Diltiazem Hcl Nausea And Vomiting    Hospitalized when it occurred    Eplerenone Other (See Comments)    Hair loss   Indomethacin Other (See Comments)    esophagitis   Naproxen Other (See Comments)    Gastric intolerance   Norvasc [Amlodipine Besylate] Swelling    Severe edema / Headaches    Tenex [Guanfacine Hcl] Other (See Comments)    Chronic insomnia    Triamterene-Hctz Other (See Comments)    Acute renal failure, hypokalemia    Potassium Chloride Other (See Comments)    Severe hair loss, baldness   Hytrin [Terazosin Hcl] Other (See Comments)    Pt states she can not take this med but can not relate any side effect or incident that occurred   Lisinopril Other (See Comments)    Unknown, pt says she "can't take it", cough/hypotension and thirst    Family History  Problem Relation Age of Onset   Stroke Mother    Hypertension Mother    Stroke Brother    Cancer Neg Hx    Alcohol  abuse Neg Hx    Early death Neg Hx    Hearing loss Neg Hx    Heart disease Neg Hx    Hyperlipidemia Neg Hx    Kidney disease Neg Hx      Prior to Admission medications   Medication Sig Start Date End Date Taking? Authorizing Provider  B Complex-C (B-COMPLEX WITH VITAMIN C) tablet Take 1 tablet by mouth daily.   Yes [provider]  GARLIC PO Take 2 tablets by mouth daily.    Yes [provider]  Magnesium 300 MG CAPS Take 300 mg by mouth daily.   Yes [provider]  Multiple Vitamins-Minerals (MULTIVITAMIN WITH MINERALS) tablet Take 1 tablet by mouth daily.   Yes [provider]  OVER THE COUNTER MEDICATION Take 2,000 Units by mouth daily. Vitamin D "vegetable blend"   Yes [provider]  hydrALAZINE (APRESOLINE) 25 MG tablet Take 1 tablet (25 mg total) by mouth 2 (two) times daily. Patient not taking: Reported on 03/15/2020 12/22/19   Patrecia Pour, MD  meclizine (ANTIVERT) 25 MG tablet Take 1 tablet (25 mg total) by mouth 3 (three) times daily as needed for dizziness. Patient not taking: Reported on 03/15/2020 12/22/19   Patrecia Pour, MD  triamterene-hydrochlorothiazide (MAXZIDE-25) 37.5-25 MG tablet Take 1 tablet by mouth daily. Patient not taking: Reported on 03/15/2020 10/14/19   [provider]    Physical Exam: Vitals:   03/15/20 1015 03/15/20 1200 03/15/20 1445 03/15/20 1500  BP: (!) 155/82  (!) 168/84 (!) 167/87  Pulse: 72  71 69  Resp: (!) 23  14 16   Temp:      TempSrc:      SpO2: 99%  100% 100%  Weight:  67.1 kg    Height:  5\' 5"  (1.651 m)      Constitutional: NAD, calm, comfortable Vitals:   03/15/20 1015 03/15/20 1200 03/15/20 1445 03/15/20 1500  BP: (!) 155/82  (!) 168/84 (!) 167/87  Pulse: 72  71 69  Resp: (!) 23  14 16   Temp:      TempSrc:      SpO2: 99%  100% 100%  Weight:  67.1 kg    Height:  5\' 5"  (1.651 m)     Eyes: PERRL, lids and conjunctivae normal ENMT: Mucous membranes are moist. Posterior pharynx clear of any exudate or lesions.Normal dentition.  Neck: normal, supple, no masses, no thyromegaly Respiratory: clear to auscultation bilaterally, no wheezing, no crackles. Normal respiratory effort. No accessory muscle use.  Cardiovascular: Regular rate and rhythm, no murmurs / rubs / gallops. No extremity edema. 2+ pedal pulses. No carotid bruits.  Abdomen: no tenderness, no masses palpated. No hepatosplenomegaly. Bowel sounds positive.  Musculoskeletal: no clubbing / cyanosis. No joint deformity upper and lower extremities. Good ROM, no contractures. Normal muscle tone.  Skin: no rashes, lesions, ulcers.  No induration Neurologic: CN 2-12 grossly intact. Sensation intact, DTR normal. Strength 5/5 in all 4.  Psychiatric: Normal judgment and insight. Alert and oriented x 3. Normal mood.     Labs on Admission: I have personally reviewed following labs and imaging studies  CBC: Recent Labs  Lab 03/15/20 0602  WBC 3.0*   2.9*  NEUTROABS 1.5*  HGB 10.9*   10.9*  HCT 33.5*   33.8*  MCV 85.0   84.9  PLT 178   476   Basic Metabolic Panel: Recent Labs  Lab 03/15/20 0602  NA 139  K 3.4*  CL 103  CO2 27  GLUCOSE 113*  BUN 8  CREATININE 0.92  CALCIUM 9.2   GFR: Estimated Creatinine Clearance: 49.7 mL/min (by C-G formula based on SCr of 0.92 mg/dL). Liver Function Tests: Recent Labs  Lab 03/15/20 0602  AST 22  ALT 13  ALKPHOS 52  BILITOT 0.9  PROT 6.8  ALBUMIN 3.7   No results for input(s): LIPASE, AMYLASE in the last 168 hours. No results for input(s): AMMONIA in the last 168 hours. Coagulation Profile: No results for input(s): INR, PROTIME in the last 168 hours. Cardiac Enzymes: No results for input(s): CKTOTAL, CKMB, CKMBINDEX, TROPONINI in the last 168 hours. BNP (last 3 results) No results for input(s): PROBNP in the last 8760 hours. HbA1C: No results for input(s): HGBA1C in the last 72 hours. CBG: No results for input(s): GLUCAP in the last 168 hours. Lipid Profile: No results for input(s): CHOL, HDL, LDLCALC, TRIG, CHOLHDL, LDLDIRECT in the last 72 hours. Thyroid Function Tests: No results for input(s): TSH, T4TOTAL, FREET4, T3FREE, THYROIDAB in the last 72 hours. Anemia Panel: No results for input(s): VITAMINB12, FOLATE, FERRITIN, TIBC, IRON, RETICCTPCT in the last 72 hours. Urine analysis:    Component Value Date/Time   COLORURINE COLORLESS (A) 03/06/2020 1320   APPEARANCEUR CLEAR 03/06/2020 1320   LABSPEC 1.003 (L) 03/06/2020 1320   PHURINE 8.0 03/06/2020 1320   GLUCOSEU NEGATIVE 03/06/2020 1320   GLUCOSEU NEGATIVE 09/30/2013 1255   HGBUR NEGATIVE  03/06/2020 1320   BILIRUBINUR NEGATIVE 03/06/2020 1320   KETONESUR NEGATIVE 03/06/2020 1320   PROTEINUR NEGATIVE 03/06/2020 1320   UROBILINOGEN 0.2 07/12/2015 0939   NITRITE NEGATIVE 03/06/2020 1320   LEUKOCYTESUR NEGATIVE 03/06/2020 1320    Radiological Exams on Admission: DG Chest 2 View  Result Date: 03/15/2020 CLINICAL DATA:  Chest pain EXAM: CHEST - 2 VIEW COMPARISON:  07/31/2018 FINDINGS: Normal heart size and mediastinal contours. No acute infiltrate or edema. No effusion or pneumothorax. Thoracic scoliosis. No acute osseous findings. IMPRESSION: No active cardiopulmonary disease. Electronically Signed   By: Monte Fantasia M.D.   On: 03/15/2020 06:26    EKG: Independently reviewed.  T wave flattening on V4 to V6 compared EKG done 2 months ago  Assessment/Plan Active Problems:   NSTEMI (non-ST elevated myocardial infarction) ([place])  (please populate well all problems here in Problem List. (For example, if patient is on BP meds at home and you resume or decide to hold them, it is a problem that needs to be her. Same for CAD, COPD, HLD and so on)  Non-ST elevation MI -Start ACS meds including aspirin, Coreg, ARB, statin, and Heparin drip -Repeat EKG and troponin in the morning, cardiology plan for cath tomorrow, n.p.o. after midnight  Uncontrolled hypertension -Change her regimen, discontinue triamterene, start ARB, discontinue hydralazine, start Coreg -Titrate according to response  HLD -Start statin DVT prophylaxis: Heparin drip Code Status: Full code Family Communication: None at bedside Disposition Plan: Likely will need 1 to 2 days hospital stay for cardiac cath and other cardiac work-up. Consults called: Cardiology Admission status: PCU   Lequita Halt MD [facility] Pager [phone]   03/15/2020, 3:13 PM

## 2020-03-15 NOTE — ED Notes (Signed)
Pt talking on the phone with her son  Pt unhappy about the large size of the pillow pt given a small pillow  She appeared to  Be ok with that size pillow  Unhappy about staying  In the ed

## 2020-03-15 NOTE — ED Triage Notes (Addendum)
Pt presents to ED BIB [facility]. Pt c/o CP that began around 0500. Radiates to L arm and neck. Pt c/o n/v. Nitro x1 did relieve some pain. Pt reports it felt like something was "thumping" in her chest. No aspirin d/t allergy. Hx HTN.  HR- 70 -NSR 154/95 18G LAC

## 2020-03-16 ENCOUNTER — Inpatient Hospital Stay (HOSPITAL_COMMUNITY): Payer: PPO

## 2020-03-16 ENCOUNTER — Encounter (HOSPITAL_COMMUNITY)
Admission: EM | Disposition: A | Discharge status: Home / Self Care | Payer: Self-pay | Source: Home / Self Care | Attending: Family Medicine

## 2020-03-16 DIAGNOSIS — I361 Nonrheumatic tricuspid (valve) insufficiency: Secondary | ICD-10-CM

## 2020-03-16 DIAGNOSIS — I2510 Atherosclerotic heart disease of native coronary artery without angina pectoris: Secondary | ICD-10-CM

## 2020-03-16 HISTORY — PX: CORONARY STENT INTERVENTION: CATH118234

## 2020-03-16 HISTORY — PX: LEFT HEART CATH AND CORONARY ANGIOGRAPHY: CATH118249

## 2020-03-16 LAB — ECHOCARDIOGRAM COMPLETE
Height: 65 in
Weight: 2366.9 oz

## 2020-03-16 LAB — CBC
HCT: 33 % — ABNORMAL LOW (ref 36.0–46.0)
Hemoglobin: 11 g/dL — ABNORMAL LOW (ref 12.0–15.0)
MCH: 28.5 pg (ref 26.0–34.0)
MCHC: 33.3 g/dL (ref 30.0–36.0)
MCV: 85.5 fL (ref 80.0–100.0)
Platelets: 172 10*3/uL (ref 150–400)
RBC: 3.86 MIL/uL — ABNORMAL LOW (ref 3.87–5.11)
RDW: 13.9 % (ref 11.5–15.5)
WBC: 4 10*3/uL (ref 4.0–10.5)
nRBC: 0 % (ref 0.0–0.2)

## 2020-03-16 LAB — BASIC METABOLIC PANEL
Anion gap: 11 (ref 5–15)
BUN: 9 mg/dL (ref 8–23)
CO2: 22 mmol/L (ref 22–32)
Calcium: 8.9 mg/dL (ref 8.9–10.3)
Chloride: 107 mmol/L (ref 98–111)
Creatinine, Ser: 0.94 mg/dL (ref 0.44–1.00)
GFR calc Af Amer: >60 mL/min (ref >60)
GFR calc non Af Amer: >60 mL/min (ref >60)
Glucose, Bld: 101 mg/dL — ABNORMAL HIGH (ref 70–99)
Potassium: 3.9 mmol/L (ref 3.5–5.1)
Sodium: 140 mmol/L (ref 135–145)

## 2020-03-16 LAB — MAGNESIUM: Magnesium: 2 mg/dL (ref 1.7–2.4)

## 2020-03-16 LAB — TROPONIN I (HIGH SENSITIVITY): Troponin I (High Sensitivity): 180 ng/L (ref <18)

## 2020-03-16 LAB — LIPID PANEL
Cholesterol: 195 mg/dL (ref 0–200)
HDL: 61 mg/dL (ref >40)
LDL Cholesterol: 123 mg/dL — ABNORMAL HIGH (ref 0–99)
Total CHOL/HDL Ratio: 3.2 RATIO
Triglycerides: 57 mg/dL (ref <150)
VLDL: 11 mg/dL (ref 0–40)

## 2020-03-16 LAB — GLUCOSE, CAPILLARY: Glucose-Capillary: 132 mg/dL — ABNORMAL HIGH (ref 70–99)

## 2020-03-16 LAB — POCT ACTIVATED CLOTTING TIME: Activated Clotting Time: 324 seconds

## 2020-03-16 LAB — HEPARIN LEVEL (UNFRACTIONATED): Heparin Unfractionated: 0.58 IU/mL (ref 0.30–0.70)

## 2020-03-16 SURGERY — LEFT HEART CATH AND CORONARY ANGIOGRAPHY
Anesthesia: LOCAL

## 2020-03-16 MED ORDER — SODIUM CHLORIDE 0.9 % WEIGHT BASED INFUSION: 1.0000 mL/kg/h | INTRAVENOUS | Status: DC

## 2020-03-16 MED ORDER — NITROGLYCERIN IN D5W 200-5 MCG/ML-% IV SOLN: 0.0000 ug/min | INTRAVENOUS | Status: DC

## 2020-03-16 MED ORDER — SODIUM CHLORIDE 0.9 % WEIGHT BASED INFUSION
3.0000 mL/kg/h | INTRAVENOUS | Status: DC
  Administered 2020-03-16: 3 mL/kg/h via INTRAVENOUS

## 2020-03-16 MED ORDER — HYDRALAZINE HCL 20 MG/ML IJ SOLN
INTRAMUSCULAR | Status: AC
  Filled 2020-03-16: qty 1

## 2020-03-16 MED ORDER — CLOPIDOGREL BISULFATE 300 MG PO TABS
ORAL_TABLET | ORAL | Status: DC | PRN
  Administered 2020-03-16: 600 mg via ORAL

## 2020-03-16 MED ORDER — LIDOCAINE HCL (PF) 1 % IJ SOLN
INTRAMUSCULAR | Status: AC
  Filled 2020-03-16: qty 30

## 2020-03-16 MED ORDER — LIDOCAINE HCL (PF) 1 % IJ SOLN
INTRAMUSCULAR | Status: DC | PRN
  Administered 2020-03-16: 2 mL

## 2020-03-16 MED ORDER — LABETALOL HCL 5 MG/ML IV SOLN: 10.0000 mg | INTRAVENOUS | Status: AC | PRN

## 2020-03-16 MED ORDER — VERAPAMIL HCL 2.5 MG/ML IV SOLN
INTRAVENOUS | Status: AC
  Filled 2020-03-16: qty 2

## 2020-03-16 MED ORDER — SODIUM CHLORIDE 0.9% FLUSH: 3.0000 mL | INTRAVENOUS | Status: DC | PRN

## 2020-03-16 MED ORDER — MIDAZOLAM HCL 2 MG/2ML IJ SOLN
INTRAMUSCULAR | Status: AC
  Filled 2020-03-16: qty 2

## 2020-03-16 MED ORDER — FAMOTIDINE IN NACL 20-0.9 MG/50ML-% IV SOLN
INTRAVENOUS | Status: AC | PRN
  Administered 2020-03-16: 20 mg via INTRAVENOUS

## 2020-03-16 MED ORDER — SODIUM CHLORIDE 0.9 % IV SOLN: 250.0000 mL | INTRAVENOUS | Status: DC | PRN

## 2020-03-16 MED ORDER — FENTANYL CITRATE (PF) 100 MCG/2ML IJ SOLN
INTRAMUSCULAR | Status: AC
  Filled 2020-03-16: qty 2

## 2020-03-16 MED ORDER — ASPIRIN EC 81 MG PO TBEC
81.0000 mg | DELAYED_RELEASE_TABLET | Freq: Every day | ORAL | Status: DC
  Administered 2020-03-17: 81 mg via ORAL
  Filled 2020-03-16: qty 1

## 2020-03-16 MED ORDER — FAMOTIDINE IN NACL 20-0.9 MG/50ML-% IV SOLN
INTRAVENOUS | Status: AC
  Filled 2020-03-16: qty 50

## 2020-03-16 MED ORDER — HYDRALAZINE HCL 20 MG/ML IJ SOLN: 10.0000 mg | INTRAMUSCULAR | Status: AC | PRN

## 2020-03-16 MED ORDER — CLOPIDOGREL BISULFATE 75 MG PO TABS
75.0000 mg | ORAL_TABLET | Freq: Every day | ORAL | Status: DC
  Administered 2020-03-17: 75 mg via ORAL
  Filled 2020-03-16: qty 1

## 2020-03-16 MED ORDER — ASPIRIN 81 MG PO CHEW: 81.0000 mg | CHEWABLE_TABLET | ORAL | Status: DC

## 2020-03-16 MED ORDER — SODIUM CHLORIDE 0.9 % WEIGHT BASED INFUSION: 3.0000 mL/kg/h | INTRAVENOUS | Status: DC

## 2020-03-16 MED ORDER — HEPARIN SODIUM (PORCINE) 1000 UNIT/ML IJ SOLN
INTRAMUSCULAR | Status: AC
  Filled 2020-03-16: qty 1

## 2020-03-16 MED ORDER — VERAPAMIL HCL 2.5 MG/ML IV SOLN
INTRAVENOUS | Status: DC | PRN
  Administered 2020-03-16: 10 mL via INTRA_ARTERIAL

## 2020-03-16 MED ORDER — NITROGLYCERIN 1 MG/10 ML FOR IR/CATH LAB
INTRA_ARTERIAL | Status: AC
  Filled 2020-03-16: qty 10

## 2020-03-16 MED ORDER — FENTANYL CITRATE (PF) 100 MCG/2ML IJ SOLN
INTRAMUSCULAR | Status: DC | PRN
  Administered 2020-03-16: 50 ug via INTRAVENOUS

## 2020-03-16 MED ORDER — SODIUM CHLORIDE 0.9% FLUSH
3.0000 mL | Freq: Two times a day (BID) | INTRAVENOUS | Status: DC
  Administered 2020-03-17: 3 mL via INTRAVENOUS

## 2020-03-16 MED ORDER — HEPARIN (PORCINE) IN NACL 1000-0.9 UT/500ML-% IV SOLN
INTRAVENOUS | Status: AC
  Filled 2020-03-16: qty 1000

## 2020-03-16 MED ORDER — SODIUM CHLORIDE 0.9 % WEIGHT BASED INFUSION
1.0000 mL/kg/h | INTRAVENOUS | Status: DC
  Administered 2020-03-16: 1 mL/kg/h via INTRAVENOUS

## 2020-03-16 MED ORDER — MIDAZOLAM HCL 2 MG/2ML IJ SOLN
INTRAMUSCULAR | Status: DC | PRN
  Administered 2020-03-16: 2 mg via INTRAVENOUS

## 2020-03-16 MED ORDER — HEPARIN (PORCINE) IN NACL 1000-0.9 UT/500ML-% IV SOLN
INTRAVENOUS | Status: DC | PRN
  Administered 2020-03-16 (×2): 500 mL

## 2020-03-16 MED ORDER — CLOPIDOGREL BISULFATE 300 MG PO TABS
ORAL_TABLET | ORAL | Status: AC
  Filled 2020-03-16: qty 2

## 2020-03-16 MED ORDER — HEPARIN SODIUM (PORCINE) 1000 UNIT/ML IJ SOLN
INTRAMUSCULAR | Status: DC | PRN
  Administered 2020-03-16: 6500 [IU] via INTRAVENOUS
  Administered 2020-03-16: 3500 [IU] via INTRAVENOUS

## 2020-03-16 MED ORDER — NITROGLYCERIN IN D5W 200-5 MCG/ML-% IV SOLN
INTRAVENOUS | Status: AC
  Administered 2020-03-16: 5 ug/min via INTRAVENOUS
  Filled 2020-03-16: qty 250

## 2020-03-16 MED ORDER — IOHEXOL 350 MG/ML SOLN
INTRAVENOUS | Status: DC | PRN
  Administered 2020-03-16: 95 mL

## 2020-03-16 MED ORDER — SODIUM CHLORIDE 0.9 % IV SOLN: INTRAVENOUS | Status: AC

## 2020-03-16 MED ORDER — HYDRALAZINE HCL 20 MG/ML IJ SOLN
INTRAMUSCULAR | Status: DC | PRN
  Administered 2020-03-16: 10 mg via INTRAVENOUS

## 2020-03-16 MED ORDER — ASPIRIN 81 MG PO CHEW
81.0000 mg | CHEWABLE_TABLET | ORAL | Status: AC
  Administered 2020-03-16: 81 mg via ORAL
  Filled 2020-03-16: qty 1

## 2020-03-16 SURGICAL SUPPLY — 17 items
BALLN SAPPHIRE 2.0X12 (BALLOONS) ×2
BALLN SAPPHIRE ~~LOC~~ 3.75X8 (BALLOONS) ×1 IMPLANT
BALLOON SAPPHIRE 2.0X12 (BALLOONS) IMPLANT
CATH 5FR JL3.5 JR4 ANG PIG MP (CATHETERS) ×1 IMPLANT
CATH VISTA GUIDE 6FR XBLAD3.5 (CATHETERS) ×1 IMPLANT
DEVICE RAD COMP TR BAND LRG (VASCULAR PRODUCTS) ×1 IMPLANT
GLIDESHEATH SLEND SS 6F .021 (SHEATH) ×1 IMPLANT
GUIDEWIRE INQWIRE 1.5J.035X260 (WIRE) IMPLANT
INQWIRE 1.5J .035X260CM (WIRE) ×2
KIT ENCORE 26 ADVANTAGE (KITS) ×1 IMPLANT
KIT HEART LEFT (KITS) ×2 IMPLANT
PACK CARDIAC CATHETERIZATION (CUSTOM PROCEDURE TRAY) ×2 IMPLANT
STENT SYNERGY XD 3.50X12 (Permanent Stent) IMPLANT
SYNERGY XD 3.50X12 (Permanent Stent) ×2 IMPLANT
TRANSDUCER W/STOPCOCK (MISCELLANEOUS) ×2 IMPLANT
TUBING CIL FLEX 10 FLL-RA (TUBING) ×2 IMPLANT
WIRE COUGAR XT STRL 190CM (WIRE) ×1 IMPLANT

## 2020-03-16 NOTE — Progress Notes (Signed)
[facility]  PROGRESS NOTE  Anne [name] ZYS:[id number] DOB: October 23, 1945 DOA: 03/15/2020 PCP: Lucianne Lei, MD   Brief HPI:   74 year old female with medical history of hypertension, hyperlipidemia on diet modification, presented with new onset chest pain. EKG showed T wave flattening in V4 to V6 compared to EKG done 2 months ago. Troponin went up from 34-501. Cardiology was consulted. Patient started on IV heparin for NSTEMI.    Subjective   Patient seen and examined, had episode of chest pain last night, started on IV nitroglycerin.   Assessment/Plan:     1. NSTEMI-patient started on IV heparin, aspirin, Coreg, statin. Also started on nitroglycerin gtt. yesterday for chest pain. Cardiology has seen the patient and plan for left heart cath today. 2. Uncontrolled hypertension-blood pressure has been uncontrolled likely from poor compliance. Triamterene was discontinued, patient started on losartan, Coreg. 3. Hyperlipidemia-started on atorvastatin.    SpO2: 100 %   COVID-19 Labs  No results for input(s): DDIMER, FERRITIN, LDH, CRP in the last 72 hours.  Lab Results  Component Value Date   SARSCOV2NAA NEGATIVE 03/15/2020   [place] NEGATIVE 12/21/2019   [place] Not Detected 08/10/2019     CBG: No results for input(s): GLUCAP in the last 168 hours.  CBC: Recent Labs  Lab 03/15/20 0602 03/16/20 0517  WBC 3.0*  2.9* 4.0  NEUTROABS 1.5*  --   HGB 10.9*  10.9* 11.0*  HCT 33.5*  33.8* 33.0*  MCV 85.0  84.9 85.5  PLT 178  178 932    Basic Metabolic Panel: Recent Labs  Lab 03/15/20 0602 03/16/20 0517 03/16/20 0617  NA 139 140  --   K 3.4* 3.9  --   CL 103 107  --   CO2 27 22  --   GLUCOSE 113* 101*  --   BUN 8 9  --   CREATININE 0.92 0.94  --   CALCIUM 9.2 8.9  --   MG  --   --  2.0     Liver Function Tests: Recent Labs  Lab 03/15/20 0602  AST 22  ALT 13  ALKPHOS 52  BILITOT 0.9  PROT 6.8  ALBUMIN 3.7        DVT  prophylaxis: Heparin drip  Code Status: Full code  Family Communication: No family at bedside    Status is: Inpatient  Dispo: The patient is from: Home              Anticipated d/c is to: Home              Anticipated d/c date is: 03/18/2020              Patient currently not medically stable for discharge  Barrier to discharge-plan for cardiac cath today.        Scheduled medications:  . [MAR Hold] aspirin EC  81 mg Oral Daily  . [MAR Hold] atorvastatin  40 mg Oral Daily  . [MAR Hold] carvedilol  3.125 mg Oral BID WC  . [MAR Hold] losartan  50 mg Oral Daily  . [MAR Hold] multivitamin with minerals  1 tablet Oral Daily  . [MAR Hold] sodium chloride flush  3 mL Intravenous Q12H    Consultants:  Cardiology  Procedures:  None  Antibiotics:   Anti-infectives (From admission, onward)   None       Objective   Vitals:   03/16/20 1145 03/16/20 1215 03/16/20 1245 03/16/20 1256  BP:  (!) 171/143 (!) 163/81   Pulse:  64 79 65   Resp: 12 (!) 22 13   Temp:    98.8 F (37.1 C)  TempSrc:    Oral  SpO2: 98% 98% 100%   Weight:      Height:        Intake/Output Summary (Last 24 hours) at 03/16/2020 1348 Last data filed at 03/16/2020 1304 Gross per 24 hour  Intake 180.05 ml  Output 900 ml  Net -719.95 ml    07/06 1901 - 07/08 0700 In: -  Out: 1900 [Urine:1900]  Filed Weights   03/15/20 1200  Weight: 67.1 kg    Physical Examination:    General: Appears in no acute distress  Cardiovascular: S1-S2, regular, no murmur auscultated  Respiratory: Clear to auscultation bilaterally  Abdomen: Abdomen is soft, nontender, no organomegaly  Extremities: No edema in the lower extremities  Neurologic: Alert, oriented x3, intact insight and judgment    Data Reviewed:   Recent Results (from the past 240 hour(s))  SARS Coronavirus 2 by RT PCR (hospital order, performed in [place] hospital lab) Nasopharyngeal Nasopharyngeal Swab     Status: None    Collection Time: 03/15/20 10:06 AM   Specimen: Nasopharyngeal Swab  Result Value Ref Range Status   SARS Coronavirus 2 NEGATIVE NEGATIVE Final    Comment: (NOTE) SARS-CoV-2 target nucleic acids are NOT DETECTED.  The SARS-CoV-2 RNA is generally detectable in upper and lower respiratory specimens during the acute phase of infection. The lowest concentration of SARS-CoV-2 viral copies this assay can detect is 250 copies / mL. A negative result does not preclude SARS-CoV-2 infection and should not be used as the sole basis for treatment or other patient management decisions.  A negative result may occur with improper specimen collection / handling, submission of specimen other than nasopharyngeal swab, presence of viral mutation(s) within the areas targeted by this assay, and inadequate number of viral copies (<250 copies / mL). A negative result must be combined with clinical observations, patient history, and epidemiological information.  Fact Sheet for Patients:   StrictlyIdeas.no  Fact Sheet for Healthcare Providers: BankingDealers.co.za  This test is not yet approved or  cleared by the [place] FDA and has been authorized for detection and/or diagnosis of SARS-CoV-2 by FDA under an Emergency Use Authorization (EUA).  This EUA will remain in effect (meaning this test can be used) for the duration of the COVID-19 declaration under Section 564(b)(1) of the Act, 21 U.S.C. section 360bbb-3(b)(1), unless the authorization is terminated or revoked sooner.  Performed at [facility], [place] [address] [place], [place] [address]     No results for input(s): LIPASE, AMYLASE in the last 168 hours. No results for input(s): AMMONIA in the last 168 hours.  Cardiac Enzymes: No results for input(s): CKTOTAL, CKMB, CKMBINDEX, TROPONINI in the last 168 hours. BNP (last 3 results) No results for input(s): BNP in the last 8760  hours.  ProBNP (last 3 results) No results for input(s): PROBNP in the last 8760 hours.  Studies:  DG Chest 2 View  Result Date: 03/15/2020 CLINICAL DATA:  Chest pain EXAM: CHEST - 2 VIEW COMPARISON:  07/31/2018 FINDINGS: Normal heart size and mediastinal contours. No acute infiltrate or edema. No effusion or pneumothorax. Thoracic scoliosis. No acute osseous findings. IMPRESSION: No active cardiopulmonary disease. Electronically Signed   By: Monte Fantasia M.D.   On: 03/15/2020 06:26       [place]   [facility] If 7PM-7AM, please contact night-coverage at [email or web address],  Office  [phone]   03/16/2020, 1:48 PM  LOS: 1 day

## 2020-03-16 NOTE — ED Notes (Signed)
Upon assessment of patient, Nitroglycerin drip was off. Restarted Nitroglycerin drip.

## 2020-03-16 NOTE — ED Notes (Signed)
Pt unable to sleep due to "machines beeping and its too cold and my back hurts."  RN adjusted pump, gave pt new warm blankets and found a pillow to place under her back.  She verbalized improvement.

## 2020-03-16 NOTE — ED Notes (Signed)
Pt reports "something is going on."  States her left hand has shooting pains, she has become nauseas and the room is spinning.  RN ran another EKG, showed it to the ED provider.  Admitting provider paged by Unit Secretary.     Pt has become very anxious.  Now reports a left sided headache.  Reports she is "all of a sudden very cold and I'm shaking."  RN did not observe her shaking.

## 2020-03-16 NOTE — ED Notes (Signed)
Pt reports nausea is better since RN gave Zofran.  States she has a "hint of the chest pain coming back."  Pt gives the pain a 2 out 10.

## 2020-03-16 NOTE — Progress Notes (Signed)
Patient arrived to floor and made comfortable in her room. Patient placed on telemetry and verified.  Patient in no acute distress. Patient denies any pain at this time.

## 2020-03-16 NOTE — ED Notes (Signed)
RN woke pt to check her status.  She stated she is feeling better.

## 2020-03-16 NOTE — Progress Notes (Signed)
Progress Note  Patient Name: Anne [name] Date of Encounter: 03/16/2020  [facility] Cardiologist: Laqueena Hinchey [place] MD  Subjective   Patient had recurrent back/chest pain this am and pain in left hand. Resolved with IV Ntg. Now pain free.   Inpatient Medications    Scheduled Meds: . [START ON 03/17/2020] aspirin EC  81 mg Oral Daily  . atorvastatin  40 mg Oral Daily  . carvedilol  3.125 mg Oral BID WC  . losartan  50 mg Oral Daily  . multivitamin with minerals  1 tablet Oral Daily  . sodium chloride flush  3 mL Intravenous Once  . sodium chloride flush  3 mL Intravenous Q12H   Continuous Infusions: . sodium chloride    . heparin 800 Units/hr (03/15/20 1537)  . nitroGLYCERIN 5 mcg/min (03/16/20 0725)   PRN Meds: acetaminophen, nitroGLYCERIN, ondansetron (ZOFRAN) IV   Vital Signs    Vitals:   03/16/20 0300 03/16/20 0330 03/16/20 0400 03/16/20 0503  BP: 129/75 140/75 133/67   Pulse: 66 67 (!) 58 75  Resp: 12 14 13  (!) 28  Temp:      TempSrc:      SpO2: 98% 99% 100% 100%  Weight:      Height:        Intake/Output Summary (Last 24 hours) at 03/16/2020 0759 Last data filed at 03/15/2020 1830 Gross per 24 hour  Intake --  Output 1900 ml  Net -1900 ml   Last 3 Weights 03/15/2020 12/21/2019 07/07/2015  Weight (lbs) 147 lb 14.9 oz 147 lb 14.9 oz 105 lb 3.2 oz  Weight (kg) 67.101 kg 67.1 kg 47.718 kg      Telemetry    NSR- Personally Reviewed  ECG    NSR with new inferolateral T wave inversion c/w ischemia - Personally Reviewed  Physical Exam   GEN: No acute distress.   Neck: No JVD Cardiac: RRR, no murmurs, rubs, or gallops.  Respiratory: Clear to auscultation bilaterally. GI: Soft, nontender, non-distended  MS: No edema; No deformity. Neuro:  Nonfocal  Psych: Normal affect   Labs    High Sensitivity Troponin:   Recent Labs  Lab 03/15/20 0602 03/15/20 1020 03/15/20 1547 03/16/20 0617  TROPONINIHS 34* 501* 654* 180*      Chemistry Recent Labs   Lab 03/15/20 0602  NA 139  K 3.4*  CL 103  CO2 27  GLUCOSE 113*  BUN 8  CREATININE 0.92  CALCIUM 9.2  PROT 6.8  ALBUMIN 3.7  AST 22  ALT 13  ALKPHOS 52  BILITOT 0.9  GFRNONAA >60  GFRAA >60  ANIONGAP 9     Hematology Recent Labs  Lab 03/15/20 0602 03/16/20 0517  WBC 3.0*  2.9* 4.0  RBC 3.94  3.98 3.86*  HGB 10.9*  10.9* 11.0*  HCT 33.5*  33.8* 33.0*  MCV 85.0  84.9 85.5  MCH 27.7  27.4 28.5  MCHC 32.5  32.2 33.3  RDW 13.8  13.8 13.9  PLT 178  178 172    BNPNo results for input(s): BNP, PROBNP in the last 168 hours.   DDimer No results for input(s): DDIMER in the last 168 hours.   Radiology    DG Chest 2 View  Result Date: 03/15/2020 CLINICAL DATA:  Chest pain EXAM: CHEST - 2 VIEW COMPARISON:  07/31/2018 FINDINGS: Normal heart size and mediastinal contours. No acute infiltrate or edema. No effusion or pneumothorax. Thoracic scoliosis. No acute osseous findings. IMPRESSION: No active cardiopulmonary disease. Electronically Signed  By: Monte Fantasia M.D.   On: 03/15/2020 06:26    Cardiac Studies   Echo pending  Patient Profile     74 y.o. female with chronic severe HTN, hypercholesterolemia, hypokalemia and noncompliance admitted with NSTEMI  Assessment & Plan    1. NSTEMI. Classic symptoms on presentation. Recurrent pain this am resolved with IV Ntg. Peak troponin 654. Ecg with new inferolateral T wave inversion. Now on ASA, statin, Coreg, IV heparin and IV Ntg. Echo pending. Plan Cardiac cath with PCI today. The procedure and risks were reviewed including but not limited to death, myocardial infarction, stroke, arrythmias, bleeding, transfusion, emergency surgery, dye allergy, or renal dysfunction. The patient voices understanding and is agreeable to proceed. Explained that if stent is placed it is critical that she take DAPT as prescribed. She voices agreement to do so. 2. HTN. Severe uncontrolled. Much improved this am. Compliance has been a  major issue. 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 Serum PRA levels ordered. Now BP well controlled on low dose Coreg, losartan and IV Ntg. Will hold HCTZ due to hypokalemia.  3. Hypercholesterolemia. LDL 123. Goal < 70. Started on lipitor 40 mg daily. 4. Noncompliance.      For questions or updates, please contact [place] Please consult [email or web address] for contact info under        Signed, Elbert Spickler [place], MD  03/16/2020, 7:59 AM

## 2020-03-16 NOTE — Progress Notes (Signed)
Patient called because she was feeling nauseous.  Patient started vomiting her dinner up.  Gave her some zofran but patient remains not feeling well.  Patient cardiac rhythm went to sinus bradycardia when she vomited but it did not stay in bradycardia.  Spoke with central telemetry regarding this as well.

## 2020-03-16 NOTE — ED Notes (Signed)
Requested a hospital bed per RN Jennifer--Daisy Lites

## 2020-03-16 NOTE — ED Notes (Signed)
Pt placed on hospital bed for comfort.  She felt warm to the touch however her oral temp was 98.3  Pt states she is comfortable at this time.  Registration bedside.

## 2020-03-16 NOTE — Progress Notes (Signed)
Patient resting in no acute distress. Patient has no complaints.  Patient ordered her dinner tray with help.

## 2020-03-16 NOTE — Interval H&P Note (Signed)
History and Physical Interval Note:  03/16/2020 1:14 PM  Anne [name]  has presented today for surgery, with the diagnosis of chest pain.  The various methods of treatment have been discussed with the patient and family. After consideration of risks, benefits and other options for treatment, the patient has consented to  Procedure(s): LEFT HEART CATH AND CORONARY ANGIOGRAPHY (N/A) as a surgical intervention.  The patient's history has been reviewed, patient examined, no change in status, stable for surgery.  I have reviewed the patient's chart and labs.  Questions were answered to the patient's satisfaction.    Cath Lab Visit (complete for each Cath Lab visit)  Clinical Evaluation Leading to the Procedure:   ACS: Yes.    Non-ACS:    Anginal Classification: CCS III  Anti-ischemic medical therapy: No Therapy  Non-Invasive Test Results: No non-invasive testing performed  Prior CABG: No previous CABG        Lauree Chandler

## 2020-03-16 NOTE — ED Notes (Signed)
Pt c/o of back pain that goes to her chest.  NT got another EKG  RN paged cards who called back.  Ordered Nitro drip.

## 2020-03-16 NOTE — ED Notes (Signed)
Pt IV pump going off.  While RN attended to the pump, pt woke up.  Stated she could not sleep b/c of the light.  While the room lights were off there was light coming from the window in the door.  Pt asked for a sleep mask.  RN offered a washcloth to place over her eyes.  Pt was happy w/ solution.

## 2020-03-16 NOTE — H&P (View-Only) (Signed)
Progress Note  Patient Name: Anne [name] Date of Encounter: 03/16/2020  [facility] Cardiologist: Shaul Trautman [place] MD  Subjective   Patient had recurrent back/chest pain this am and pain in left hand. Resolved with IV Ntg. Now pain free.   Inpatient Medications    Scheduled Meds: . [START ON 03/17/2020] aspirin EC  81 mg Oral Daily  . atorvastatin  40 mg Oral Daily  . carvedilol  3.125 mg Oral BID WC  . losartan  50 mg Oral Daily  . multivitamin with minerals  1 tablet Oral Daily  . sodium chloride flush  3 mL Intravenous Once  . sodium chloride flush  3 mL Intravenous Q12H   Continuous Infusions: . sodium chloride    . heparin 800 Units/hr (03/15/20 1537)  . nitroGLYCERIN 5 mcg/min (03/16/20 0725)   PRN Meds: acetaminophen, nitroGLYCERIN, ondansetron (ZOFRAN) IV   Vital Signs    Vitals:   03/16/20 0300 03/16/20 0330 03/16/20 0400 03/16/20 0503  BP: 129/75 140/75 133/67   Pulse: 66 67 (!) 58 75  Resp: 12 14 13  (!) 28  Temp:      TempSrc:      SpO2: 98% 99% 100% 100%  Weight:      Height:        Intake/Output Summary (Last 24 hours) at 03/16/2020 0759 Last data filed at 03/15/2020 1830 Gross per 24 hour  Intake --  Output 1900 ml  Net -1900 ml   Last 3 Weights 03/15/2020 12/21/2019 07/07/2015  Weight (lbs) 147 lb 14.9 oz 147 lb 14.9 oz 105 lb 3.2 oz  Weight (kg) 67.101 kg 67.1 kg 47.718 kg      Telemetry    NSR- Personally Reviewed  ECG    NSR with new inferolateral T wave inversion c/w ischemia - Personally Reviewed  Physical Exam   GEN: No acute distress.   Neck: No JVD Cardiac: RRR, no murmurs, rubs, or gallops.  Respiratory: Clear to auscultation bilaterally. GI: Soft, nontender, non-distended  MS: No edema; No deformity. Neuro:  Nonfocal  Psych: Normal affect   Labs    High Sensitivity Troponin:   Recent Labs  Lab 03/15/20 0602 03/15/20 1020 03/15/20 1547 03/16/20 0617  TROPONINIHS 34* 501* 654* 180*      Chemistry Recent Labs   Lab 03/15/20 0602  NA 139  K 3.4*  CL 103  CO2 27  GLUCOSE 113*  BUN 8  CREATININE 0.92  CALCIUM 9.2  PROT 6.8  ALBUMIN 3.7  AST 22  ALT 13  ALKPHOS 52  BILITOT 0.9  GFRNONAA >60  GFRAA >60  ANIONGAP 9     Hematology Recent Labs  Lab 03/15/20 0602 03/16/20 0517  WBC 3.0*  2.9* 4.0  RBC 3.94  3.98 3.86*  HGB 10.9*  10.9* 11.0*  HCT 33.5*  33.8* 33.0*  MCV 85.0  84.9 85.5  MCH 27.7  27.4 28.5  MCHC 32.5  32.2 33.3  RDW 13.8  13.8 13.9  PLT 178  178 172    BNPNo results for input(s): BNP, PROBNP in the last 168 hours.   DDimer No results for input(s): DDIMER in the last 168 hours.   Radiology    DG Chest 2 View  Result Date: 03/15/2020 CLINICAL DATA:  Chest pain EXAM: CHEST - 2 VIEW COMPARISON:  07/31/2018 FINDINGS: Normal heart size and mediastinal contours. No acute infiltrate or edema. No effusion or pneumothorax. Thoracic scoliosis. No acute osseous findings. IMPRESSION: No active cardiopulmonary disease. Electronically Signed  By: Monte Fantasia M.D.   On: 03/15/2020 06:26    Cardiac Studies   Echo pending  Patient Profile     74 y.o. female with chronic severe HTN, hypercholesterolemia, hypokalemia and noncompliance admitted with NSTEMI  Assessment & Plan    1. NSTEMI. Classic symptoms on presentation. Recurrent pain this am resolved with IV Ntg. Peak troponin 654. Ecg with new inferolateral T wave inversion. Now on ASA, statin, Coreg, IV heparin and IV Ntg. Echo pending. Plan Cardiac cath with PCI today. The procedure and risks were reviewed including but not limited to death, myocardial infarction, stroke, arrythmias, bleeding, transfusion, emergency surgery, dye allergy, or renal dysfunction. The patient voices understanding and is agreeable to proceed. Explained that if stent is placed it is critical that she take DAPT as prescribed. She voices agreement to do so. 2. HTN. Severe uncontrolled. Much improved this am. Compliance has been a  major issue. 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 Serum PRA levels ordered. Now BP well controlled on low dose Coreg, losartan and IV Ntg. Will hold HCTZ due to hypokalemia.  3. Hypercholesterolemia. LDL 123. Goal < 70. Started on lipitor 40 mg daily. 4. Noncompliance.      For questions or updates, please contact [place] Please consult [email or web address] for contact info under        Signed, Makayah Pauli [place], MD  03/16/2020, 7:59 AM

## 2020-03-16 NOTE — Progress Notes (Signed)
[place] for Heparin Indication: chest pain/ACS  Allergies  Allergen Reactions  . Amiloride Other (See Comments)    Hair loss  . Anesthetics, Amide Other (See Comments)    Lethargic and very slow reanimation lasting a full day or so  . Other Other (See Comments)    Very hard to awake from anaesthetic with surgery.  Diona Fanti [Aspirin] Other (See Comments)    Causes stomach issues  . Diltiazem Hcl Nausea And Vomiting    Hospitalized when it occurred   . Eplerenone Other (See Comments)    Hair loss  . Indomethacin Other (See Comments)    esophagitis  . Naproxen Other (See Comments)    Gastric intolerance  . Norvasc [Amlodipine Besylate] Swelling    Severe edema / Headaches   . Tenex [Guanfacine Hcl] Other (See Comments)    Chronic insomnia   . Triamterene-Hctz Other (See Comments)    Acute renal failure, hypokalemia   . Potassium Chloride Other (See Comments)    Severe hair loss, baldness  . Hytrin [Terazosin Hcl] Other (See Comments)    Pt states she can not take this med but can not relate any side effect or incident that occurred  . Lisinopril Other (See Comments)    Unknown, pt says she "can't take it", cough/hypotension and thirst    Patient Measurements: Height: 5\' 5"  (165.1 cm) Weight: 67.1 kg (147 lb 14.9 oz) IBW/kg (Calculated) : 57 Heparin Dosing Weight: 147lb  Vital Signs: Temp: 98.2 F (36.8 C) (07/07 2215) BP: 133/67 (07/08 0400) Pulse Rate: 75 (07/08 0503)  Labs: Recent Labs    03/15/20 0602 03/15/20 1020 03/15/20 1547 03/16/20 0517  HGB 10.9*  10.9*  --   --   --   HCT 33.5*  33.8*  --   --   --   PLT 178  178  --   --   --   HEPARINUNFRC  --   --   --  0.58  CREATININE 0.92  --   --   --   TROPONINIHS 34* 501* 654*  --     Estimated Creatinine Clearance: 49.7 mL/min (by C-G formula based on SCr of 0.92 mg/dL).   Assessment: Patient is a 74yo female who presents with chest pain. Pharmacy has been  asked to dose heparin. Patient was not on anticoag PTA.  Heparin level therapeutic (0.58) on gtt at 800 units/hr. No bleeding noted.  Goal of Therapy:  Heparin level 0.3-0.7 units/ml Monitor platelets by anticoagulation protocol: Yes   Plan:  Continue heparin 800 units/hr Will f/u 6 hr confirmatory heparin level  Sherlon Handing, PharmD, BCPS Please see amion for complete clinical pharmacist phone list 03/16/2020,6:41 AM

## 2020-03-16 NOTE — ED Notes (Signed)
PAGED [facility] TO RN JENNIFER--Neyah Ellerman

## 2020-03-16 NOTE — ED Notes (Signed)
Pt appears to be resting comfortably.  No signs of distress at this time.

## 2020-03-16 NOTE — Progress Notes (Signed)
  Echocardiogram 2D Echocardiogram has been performed.  Anne [name] Acelynn Dejonge 03/16/2020, 10:06 AM

## 2020-03-17 ENCOUNTER — Encounter (HOSPITAL_COMMUNITY): Payer: Self-pay | Admitting: Cardiovascular Disease

## 2020-03-17 DIAGNOSIS — E2609 Other primary hyperaldosteronism: Secondary | ICD-10-CM

## 2020-03-17 DIAGNOSIS — Z955 Presence of coronary angioplasty implant and graft: Secondary | ICD-10-CM

## 2020-03-17 LAB — CBC
HCT: 32 % — ABNORMAL LOW (ref 36.0–46.0)
Hemoglobin: 10.7 g/dL — ABNORMAL LOW (ref 12.0–15.0)
MCH: 27.8 pg (ref 26.0–34.0)
MCHC: 33.4 g/dL (ref 30.0–36.0)
MCV: 83.1 fL (ref 80.0–100.0)
Platelets: 169 10*3/uL (ref 150–400)
RBC: 3.85 MIL/uL — ABNORMAL LOW (ref 3.87–5.11)
RDW: 13.7 % (ref 11.5–15.5)
WBC: 6 10*3/uL (ref 4.0–10.5)
nRBC: 0 % (ref 0.0–0.2)

## 2020-03-17 LAB — BASIC METABOLIC PANEL
Anion gap: 8 (ref 5–15)
BUN: 12 mg/dL (ref 8–23)
CO2: 25 mmol/L (ref 22–32)
Calcium: 9 mg/dL (ref 8.9–10.3)
Chloride: 107 mmol/L (ref 98–111)
Creatinine, Ser: 1.22 mg/dL — ABNORMAL HIGH (ref 0.44–1.00)
GFR calc Af Amer: 51 mL/min — ABNORMAL LOW (ref >60)
GFR calc non Af Amer: 44 mL/min — ABNORMAL LOW (ref >60)
Glucose, Bld: 108 mg/dL — ABNORMAL HIGH (ref 70–99)
Potassium: 3.5 mmol/L (ref 3.5–5.1)
Sodium: 140 mmol/L (ref 135–145)

## 2020-03-17 MED ORDER — CARVEDILOL 3.125 MG PO TABS: 3.1250 mg | ORAL_TABLET | Freq: Two times a day (BID) | ORAL | 2 refills | Status: DC

## 2020-03-17 MED ORDER — ASPIRIN 81 MG PO TBEC: 81.0000 mg | DELAYED_RELEASE_TABLET | Freq: Every day | ORAL | 11 refills | Status: DC

## 2020-03-17 MED ORDER — ATORVASTATIN CALCIUM 80 MG PO TABS: 80.0000 mg | ORAL_TABLET | Freq: Every day | ORAL | 3 refills | Status: DC

## 2020-03-17 MED ORDER — CLOPIDOGREL BISULFATE 75 MG PO TABS: 75.0000 mg | ORAL_TABLET | Freq: Every day | ORAL | 3 refills | Status: DC

## 2020-03-17 MED ORDER — ATORVASTATIN CALCIUM 80 MG PO TABS: 80.0000 mg | ORAL_TABLET | Freq: Every day | ORAL | Status: DC

## 2020-03-17 MED ORDER — SPIRONOLACTONE 25 MG PO TABS
25.0000 mg | ORAL_TABLET | Freq: Every day | ORAL | 11 refills | Status: DC
Start: 2020-03-17 — End: 2020-05-31

## 2020-03-17 NOTE — Discharge Instructions (Signed)
Heart Attack A heart attack occurs when blood and oxygen supply to the heart is cut off. A heart attack causes damage to the heart that cannot be fixed. A heart attack is also called a myocardial infarction, or MI. If you think you are having a heart attack, do not wait to see if the symptoms will go away. Get medical help right away. What are the causes? This condition may be caused by:  A fatty substance (plaque) in the blood vessels (arteries). This can block the flow of blood to the heart.  A blood clot in the blood vessels that go to the heart. The blood clot blocks blood flow.  Low blood pressure.  An abnormal heartbeat.  Some diseases, such as problems in red blood cells (anemia)orproblems in breathing (respiratory failure).  Tightening (spasm) of a blood vessel that cuts off blood to the heart.  A tear in a blood vessel of the heart.  High blood pressure. What increases the risk? The following factors may make you more likely to develop this condition:  Aging. The older you are, the higher your risk.  Having a personal or family history of chest pain, heart attack, stroke, or narrowing of the arteries in the legs, arms, head, or stomach (peripheral artery disease).  Being female.  Smoking.  Not getting regular exercise.  Being overweight or obese.  Having high blood pressure.  Having high cholesterol.  Having diabetes.  Drinking too much alcohol.  Using illegal drugs, such as cocaine or methamphetamine. What are the signs or symptoms? Symptoms of this condition include:  Chest pain. It may feel like: ? Crushing or squeezing. ? Tightness, pressure, fullness, or heaviness.  Pain in the arm, neck, jaw, back, or upper body.  Shortness of breath.  Heartburn.  Upset stomach (indigestion).  Feeling like you may vomit (nauseous).  Cold sweats.  Feeling tired.  Sudden light-headedness. How is this treated? A heart attack must be treated as soon as  possible. Treatment may include:  Medicines to: ? Break up or dissolve blood clots. ? Thin blood and help prevent blood clots. ? Treat blood pressure. ? Improve blood flow to the heart. ? Reduce pain. ? Reduce cholesterol.  Procedures to widen a blocked artery and keep it open.  Open heart surgery.  Receiving oxygen.  Making your heart strong again (cardiac rehabilitation) through exercise, education, and counseling. Follow these instructions at home: Medicines  Take over-the-counter and prescription medicines only as told by your doctor. You may need to take medicine: ? To keep your blood from clotting too easily. ? To control blood pressure. ? To lower cholesterol. ? To control heart rhythms.  Do not take these medicines unless your doctor says it is okay: ? NSAIDs, such as ibuprofen. ? Supplements that have vitamin A, vitamin E, or both. ? Hormone replacement therapy that has estrogen with or without progestin. Lifestyle      Do not use any products that have nicotine or tobacco, such as cigarettes, e-cigarettes, and chewing tobacco. If you need help quitting, ask your doctor.  Avoid secondhand smoke.  Exercise regularly. Ask your doctor about a cardiac rehab program.  Eat heart-healthy foods. Your doctor will tell you what foods to eat.  Stay at a healthy weight.  Lower your stress level.  Do not use illegal drugs. Alcohol use  Do not drink alcohol if: ? Your doctor tells you not to drink. ? You are pregnant, may be pregnant, or are planning to become pregnant.    If you drink alcohol: ? Limit how much you use to:  0-1 drink a day for women.  0-2 drinks a day for men. ? Know how much alcohol is in your drink. In the [place] one drink equals one 12 oz bottle of beer (355 mL), one 5 oz glass of wine (148 mL), or one 1 oz glass of hard liquor (44 mL). General instructions  Work with your doctor to treat other problems you may have, such as diabetes or high  blood pressure.  Get screened for depression. Get treatment if needed.  Keep your vaccines up to date. Get the flu shot (influenza vaccine) every year.  Keep all follow-up visits as told by your doctor. This is important. Contact a doctor if:  You feel very sad.  You have trouble doing your daily activities. Get help right away if:  You have sudden, unexplained discomfort in your chest, arms, back, neck, jaw, or upper body.  You have shortness of breath.  You have sudden sweating or clammy skin.  You feel like you may vomit.  You vomit.  You feel tired or weak.  You get light-headed or dizzy.  You feel your heart beating fast.  You feel your heart skipping beats.  You have blood pressure that is higher than 180/120. These symptoms may be an emergency. Do not wait to see if the symptoms will go away. Get medical help right away. Call your local emergency services (911 in the [place] Do not drive yourself to the hospital. Summary  A heart attack occurs when blood and oxygen supply to the heart is cut off.  Do not take NSAIDs unless your doctor says it is okay.  Do not smoke. Avoid secondhand smoke.  Exercise regularly. Ask your doctor about a cardiac rehab program. This information is not intended to replace advice given to you by your health care provider. Make sure you discuss any questions you have with your health care provider. Document Revised: 12/07/2018 Document Reviewed: 12/07/2018 Elsevier Patient Education  2020 [employer]  

## 2020-03-17 NOTE — Progress Notes (Signed)
Progress Note  Patient Name: Anne [name] Date of Encounter: 03/17/2020  [facility] Cardiologist: Peter Martinique, MD   Subjective   Feeling well. No chest pain, sob or palpitations.   Inpatient Medications    Scheduled Meds: . aspirin EC  81 mg Oral Daily  . atorvastatin  40 mg Oral Daily  . carvedilol  3.125 mg Oral BID WC  . clopidogrel  75 mg Oral Q breakfast  . losartan  50 mg Oral Daily  . multivitamin with minerals  1 tablet Oral Daily  . sodium chloride flush  3 mL Intravenous Q12H   Continuous Infusions: . sodium chloride     PRN Meds: sodium chloride, acetaminophen, nitroGLYCERIN, ondansetron (ZOFRAN) IV, sodium chloride flush   Vital Signs    Vitals:   03/16/20 2016 03/17/20 0016 03/17/20 0546 03/17/20 0804  BP:    134/81  Pulse:    70  Resp:    (!) 22  Temp: 99 F (37.2 C) 97.9 F (36.6 C) 98.8 F (37.1 C) 99 F (37.2 C)  TempSrc: Oral Oral Oral Oral  SpO2:    100%  Weight:   69.7 kg   Height:        Intake/Output Summary (Last 24 hours) at 03/17/2020 0952 Last data filed at 03/17/2020 0644 Gross per 24 hour  Intake 900.05 ml  Output 1000 ml  Net -99.95 ml   Last 3 Weights 03/17/2020 03/16/2020 03/15/2020  Weight (lbs) 153 lb 10.6 oz 154 lb 1.6 oz 147 lb 14.9 oz  Weight (kg) 69.7 kg 69.9 kg 67.101 kg      Telemetry    NSR - Personally Reviewed  ECG    SR with anterior inferior lateral TWI - Personally Reviewed  Physical Exam   GEN: No acute distress.   Neck: No JVD Cardiac: RRR, no murmurs, rubs, or gallops. Right radial cath site without hematoma  Respiratory: Clear to auscultation bilaterally. GI: Soft, nontender, non-distended  MS: No edema; No deformity. Neuro:  Nonfocal  Psych: Normal affect   Labs    High Sensitivity Troponin:   Recent Labs  Lab 03/15/20 0602 03/15/20 1020 03/15/20 1547 03/16/20 0617  TROPONINIHS 34* 501* 654* 180*      Chemistry Recent Labs  Lab 03/15/20 0602 03/16/20 0517 03/17/20 0404  NA  139 140 140  K 3.4* 3.9 3.5  CL 103 107 107  CO2 27 22 25   GLUCOSE 113* 101* 108*  BUN 8 9 12   CREATININE 0.92 0.94 1.22*  CALCIUM 9.2 8.9 9.0  PROT 6.8  --   --   ALBUMIN 3.7  --   --   AST 22  --   --   ALT 13  --   --   ALKPHOS 52  --   --   BILITOT 0.9  --   --   GFRNONAA >60 >60 44*  GFRAA >60 >60 51*  ANIONGAP 9 11 8      Hematology Recent Labs  Lab 03/15/20 0602 03/16/20 0517 03/17/20 0404  WBC 3.0*  2.9* 4.0 6.0  RBC 3.94  3.98 3.86* 3.85*  HGB 10.9*  10.9* 11.0* 10.7*  HCT 33.5*  33.8* 33.0* 32.0*  MCV 85.0  84.9 85.5 83.1  MCH 27.7  27.4 28.5 27.8  MCHC 32.5  32.2 33.3 33.4  RDW 13.8  13.8 13.9 13.7  PLT 178  178 172 169    Radiology    CARDIAC CATHETERIZATION  Result Date: 03/16/2020  Prox RCA lesion is  20% stenosed.  Mid Cx to Dist Cx lesion is 20% stenosed.  Prox LAD lesion is 95% stenosed.  A drug-eluting stent was successfully placed using a SYNERGY XD 3.50X12.  Post intervention, there is a 0% residual stenosis.  1. Severe stenosis proximal LAD 2. Successful PTCA/DES x 1 proximal LAD 3. Mild non-obstructive disease in the Circumflex and RCA Recommendations: DAPT with ASA and Plavix for one year. Continue statin and beta blocker. Likely d/c home tomorrow.   ECHOCARDIOGRAM COMPLETE  Result Date: 03/16/2020    ECHOCARDIOGRAM REPORT   Patient Name:   Anne [name] Date of Exam: 03/16/2020 Medical Rec #:  [id number]        Height:       65.0 in Accession #:    [id number]       Weight:       147.9 lb Date of Birth:  03-15-1946        BSA:          1.740 m Patient Age:    74 years         BP:           144/82 mmHg Patient Gender: F                HR:           66 bpm. Exam Location:  Inpatient Procedure: 2D Echo, Cardiac Doppler and Color Doppler Indications:    Acute Coronary Syndrome I24.9  History:        Patient has prior history of Echocardiogram examinations, most                 recent 10/01/2011. Risk Factors:Hypertension and Dyslipidemia.   Sonographer:    Tiffany Dance Referring Phys: [id number] [place]  1. Left ventricular ejection fraction, by estimation, is 55 to 60%. The left ventricle has normal function. The left ventricle has no regional wall motion abnormalities. There is mild left ventricular hypertrophy. Left ventricular diastolic parameters are consistent with Grade I diastolic dysfunction (impaired relaxation).  2. Right ventricular systolic function is normal. The right ventricular size is normal. There is normal pulmonary artery systolic pressure. The estimated right ventricular systolic pressure is 41.6 mmHg.  3. Left atrial size was mildly dilated.  4. The mitral valve is normal in structure. No evidence of mitral valve regurgitation. No evidence of mitral stenosis.  5. The aortic valve is tricuspid. Aortic valve regurgitation is not visualized. No aortic stenosis is present.  6. The inferior vena cava is normal in size with greater than 50% respiratory variability, suggesting right atrial pressure of 3 mmHg. FINDINGS  Left Ventricle: Left ventricular ejection fraction, by estimation, is 55 to 60%. The left ventricle has normal function. The left ventricle has no regional wall motion abnormalities. The left ventricular internal cavity size was normal in size. There is  mild left ventricular hypertrophy. Left ventricular diastolic parameters are consistent with Grade I diastolic dysfunction (impaired relaxation). Right Ventricle: The right ventricular size is normal. No increase in right ventricular wall thickness. Right ventricular systolic function is normal. There is normal pulmonary artery systolic pressure. The tricuspid regurgitant velocity is 2.20 m/s, and  with an assumed right atrial pressure of 3 mmHg, the estimated right ventricular systolic pressure is 60.6 mmHg. Left Atrium: Left atrial size was mildly dilated. Right Atrium: Right atrial size was normal in size. Pericardium: There is no evidence of  pericardial effusion. Mitral Valve: The mitral valve is normal in structure. No evidence  of mitral valve regurgitation. No evidence of mitral valve stenosis. Tricuspid Valve: The tricuspid valve is normal in structure. Tricuspid valve regurgitation is mild. Aortic Valve: The aortic valve is tricuspid. Aortic valve regurgitation is not visualized. No aortic stenosis is present. Pulmonic Valve: The pulmonic valve was normal in structure. Pulmonic valve regurgitation is not visualized. Aorta: The aortic root is normal in size and structure. Venous: The inferior vena cava is normal in size with greater than 50% respiratory variability, suggesting right atrial pressure of 3 mmHg. IAS/Shunts: No atrial level shunt detected by color flow Doppler.  LEFT VENTRICLE PLAX 2D LVIDd:         4.20 cm  Diastology LVIDs:         3.30 cm  LV e' lateral:   6.00 cm/s LV PW:         0.90 cm  LV E/e' lateral: 11.5 LV IVS:        0.90 cm  LV e' medial:    5.11 cm/s LVOT diam:     1.80 cm  LV E/e' medial:  13.5 LV SV:         52 LV SV Index:   30 LVOT Area:     2.54 cm  RIGHT VENTRICLE            IVC RV Basal diam:  2.90 cm    IVC diam: 1.30 cm RV S prime:     8.76 cm/s TAPSE (M-mode): 2.0 cm LEFT ATRIUM             Index       RIGHT ATRIUM           Index LA diam:        3.40 cm 1.95 cm/m  RA Area:     13.80 cm LA Vol (A2C):   48.5 ml 27.87 ml/m RA Volume:   36.10 ml  20.75 ml/m LA Vol (A4C):   64.6 ml 37.12 ml/m LA Biplane Vol: 56.9 ml 32.70 ml/m  AORTIC VALVE LVOT Vmax:   89.70 cm/s LVOT Vmean:  58.300 cm/s LVOT VTI:    0.205 m  AORTA Ao Root diam: 3.20 cm Ao Asc diam:  3.50 cm MITRAL VALVE               TRICUSPID VALVE MV Area (PHT): 3.12 cm    TR Peak grad:   19.4 mmHg MV Decel Time: 243 msec    TR Vmax:        220.00 cm/s MV E velocity: 69.00 cm/s MV A velocity: 86.60 cm/s  SHUNTS MV E/A ratio:  0.80        Systemic VTI:  0.20 m                            Systemic Diam: 1.80 cm Loralie Champagne MD Electronically signed by Loralie Champagne MD Signature Date/Time: 03/16/2020/3:09:05 PM    Final     Cardiac Studies   Cath 03/16/20 CORONARY STENT INTERVENTION  LEFT HEART CATH AND CORONARY ANGIOGRAPHY  Conclusion    Prox RCA lesion is 20% stenosed.  Mid Cx to Dist Cx lesion is 20% stenosed.  Prox LAD lesion is 95% stenosed.  A drug-eluting stent was successfully placed using a SYNERGY XD 3.50X12.  Post intervention, there is a 0% residual stenosis.   1. Severe stenosis proximal LAD 2. Successful PTCA/DES x 1 proximal LAD 3. Mild non-obstructive disease in the Circumflex and RCA  Recommendations: DAPT with ASA and Plavix for one year. Continue statin and beta blocker. Likely d/c home tomorrow.   Diagnostic Dominance: Right  Intervention     Echo 03/16/20 IMPRESSIONS  1. Left ventricular ejection fraction, by estimation, is 55 to 60%. The  left ventricle has normal function. The left ventricle has no regional  wall motion abnormalities. There is mild left ventricular hypertrophy.  Left ventricular diastolic parameters  are consistent with Grade I diastolic dysfunction (impaired relaxation).  2. Right ventricular systolic function is normal. The right ventricular  size is normal. There is normal pulmonary artery systolic pressure. The  estimated right ventricular systolic pressure is 09.6 mmHg.  3. Left atrial size was mildly dilated.  4. The mitral valve is normal in structure. No evidence of mitral valve  regurgitation. No evidence of mitral stenosis.  5. The aortic valve is tricuspid. Aortic valve regurgitation is not  visualized. No aortic stenosis is present.  6. The inferior vena cava is normal in size with greater than 50%  respiratory variability, suggesting right atrial pressure of 3 mmHg.   Patient Profile     74 y.o. female with chronic severe HTN, hypercholesterolemia, hypokalemia and noncompliance admitted with NSTEMI.   Assessment & Plan    1. NSTEMI - Hs-troponin peaked 654.  She had classic anginal symptoms. Echo showed preserved LVEF at 55-60%. Cath with severe proximal LAD stenosis s/p PTCA and DES. Continue ASA and Plavix for at least 1 yr. Continue statin and BB.   2. HTN - BP normalized on coreg and losartan.  3. Primary hyperaldectorism  - Ration as > 40 per primary team. Pending lab here - Recommended starting Spironolactone, Can reduce dose of Losartan and other antihypertensive agent  4. AKI - SCr minimally up post PCI - encourage hydration - Will need BMET with PCP in 1 week with follow up  5. HLD - 03/16/2020: Cholesterol 195; HDL 61; LDL Cholesterol 123; Triglycerides 57; VLDL 11  - Increase Lipitor to 80mg  qd - LDL goal less than 70 - Lipid panel and LFTs in 8 weeks   Okay to discharge today.   For questions or updates, please contact [place] Please consult [email or web address] for contact info under        SignedLeanor Kail, PA  03/17/2020, 9:52 AM

## 2020-03-17 NOTE — Discharge Summary (Addendum)
Physician Discharge Summary  Anne [name] DVV:[id number] DOB: 06-Apr-1946 DOA: 03/15/2020  PCP: Lucianne Lei, MD  Admit date: 03/15/2020 Discharge date: 03/17/2020  Time spent: 50 minutes  Recommendations for Outpatient Follow-up:  Follow-up PCP in 1 week  BMP in 1 week to check renal function.  Follow-up cardiology as outpatient  Discharge Diagnoses:  Active Problems:   NSTEMI (non-ST elevated myocardial infarction) [facility]) Hypertension Primary aldosteronism  Discharge Condition: Stable  Diet recommendation: Heart healthy diet  Filed Weights   03/15/20 1200 03/16/20 1444 03/17/20 0546  Weight: 67.1 kg 69.9 kg 69.7 kg    History of present illness:  74 year old female with medical history of hypertension, hyperlipidemia on diet modification, presented with new onset chest pain. EKG showed T wave flattening in V4 to V6 compared to EKG done 2 months ago. Troponin went up from 34-501. Cardiology was consulted. Patient started on IV heparin for NSTEMI.   Hospital Course:  NSTEMI s/p stent placement to LAD-patient underwent cardiac cath which showed severe proximal LAD stenosis s/p PTCA and DES.  Patient started on aspirin and Plavix at least for a year.  Continue Coreg, statin.  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  We will start her on spironolactone 25 mg p.o. daily.  Losartan has been discontinued.  Hypertension-started on Coreg 3.125 mg p.o. twice daily, spironolactone 25 mg p.o. daily.  She will follow up with PCP in 1 week.  Acute kidney injury-patient's baseline creatinine 0.94, went up to 1.22 after cardiac catheterization.  She will need to follow-up with PCP in 1 week to check BMP.  Procedures:  Cardiac catheterization  Prox RCA lesion is 20% stenosed.  Mid Cx to Dist Cx lesion is 20% stenosed.  Prox LAD lesion is 95% stenosed.  A drug-eluting stent was successfully placed using a SYNERGY XD 3.50X12.  Post  intervention, there is a 0% residual stenosis.  1. Severe stenosis proximal LAD 2. Successful PTCA/DES x 1 proximal LAD 3. Mild non-obstructive disease in the Circumflex and RCA  Recommendations: DAPT with ASA and Plavix for one year. Continue statin and beta blocker. Likely d/c home tomorrow.   Consultations:  Cardiology  Discharge Exam: Vitals:   03/17/20 0546 03/17/20 0804  BP:  134/81  Pulse:  70  Resp:  (!) 22  Temp: 98.8 F (37.1 C) 99 F (37.2 C)  SpO2:  100%    General: Appears in no acute distress Cardiovascular: S1-S2, regular Respiratory: Clear to auscultation bilaterally  Discharge Instructions   Discharge Instructions    Amb Referral to Cardiac Rehabilitation   Complete by: As directed    Diagnosis:  NSTEMI Coronary Stents     After initial evaluation and assessments completed: Virtual Based Care may be provided alone or in conjunction with Phase 2 Cardiac Rehab based on patient barriers.: Yes   Diet - low sodium heart healthy   Complete by: As directed    Increase activity slowly   Complete by: As directed      Allergies as of 03/17/2020      Reactions   Amiloride Other (See Comments)   Hair loss   Anesthetics, Amide Other (See Comments)   Lethargic and very slow reanimation lasting a full day or so   Other Other (See Comments)   Very hard to awake from anaesthetic with surgery.   Asa [aspirin] Other (See Comments)   Causes stomach issues   Diltiazem Hcl Nausea And Vomiting   Hospitalized when it occurred   Eplerenone Other (  See Comments)   Hair loss   Indomethacin Other (See Comments)   esophagitis   Naproxen Other (See Comments)   Gastric intolerance   Norvasc [amlodipine Besylate] Swelling   Severe edema / Headaches   Tenex [guanfacine Hcl] Other (See Comments)   Chronic insomnia   Triamterene-hctz Other (See Comments)   Acute renal failure, hypokalemia   Potassium Chloride Other (See Comments)   Severe hair loss, baldness    Hytrin [terazosin Hcl] Other (See Comments)   Pt states she can not take this med but can not relate any side effect or incident that occurred   Lisinopril Other (See Comments)   Unknown, pt says she "can't take it", cough/hypotension and thirst      Medication List    STOP taking these medications   hydrALAZINE 25 MG tablet Commonly known as: APRESOLINE   triamterene-hydrochlorothiazide 37.5-25 MG tablet Commonly known as: MAXZIDE-25     TAKE these medications   aspirin 81 MG EC tablet Take 1 tablet (81 mg total) by mouth daily. Swallow whole. Start taking on: March 18, 2020   atorvastatin 80 MG tablet Commonly known as: LIPITOR Take 1 tablet (80 mg total) by mouth daily. Start taking on: March 18, 2020   B-complex with vitamin C tablet Take 1 tablet by mouth daily.   carvedilol 3.125 MG tablet Commonly known as: COREG Take 1 tablet (3.125 mg total) by mouth 2 (two) times daily with a meal.   clopidogrel 75 MG tablet Commonly known as: PLAVIX Take 1 tablet (75 mg total) by mouth daily with breakfast. Start taking on: March 18, 1609   GARLIC PO Take 2 tablets by mouth daily.   Magnesium 300 MG Caps Take 300 mg by mouth daily.   meclizine 25 MG tablet Commonly known as: ANTIVERT Take 1 tablet (25 mg total) by mouth 3 (three) times daily as needed for dizziness.   multivitamin with minerals tablet Take 1 tablet by mouth daily.   OVER THE COUNTER MEDICATION Take 2,000 Units by mouth daily. Vitamin D "vegetable blend"   spironolactone 25 MG tablet Commonly known as: Aldactone Take 1 tablet (25 mg total) by mouth daily.      Allergies  Allergen Reactions  . Amiloride Other (See Comments)    Hair loss  . Anesthetics, Amide Other (See Comments)    Lethargic and very slow reanimation lasting a full day or so  . Other Other (See Comments)    Very hard to awake from anaesthetic with surgery.  Diona Fanti [Aspirin] Other (See Comments)    Causes stomach issues  .  Diltiazem Hcl Nausea And Vomiting    Hospitalized when it occurred   . Eplerenone Other (See Comments)    Hair loss  . Indomethacin Other (See Comments)    esophagitis  . Naproxen Other (See Comments)    Gastric intolerance  . Norvasc [Amlodipine Besylate] Swelling    Severe edema / Headaches   . Tenex [Guanfacine Hcl] Other (See Comments)    Chronic insomnia   . Triamterene-Hctz Other (See Comments)    Acute renal failure, hypokalemia   . Potassium Chloride Other (See Comments)    Severe hair loss, baldness  . Hytrin [Terazosin Hcl] Other (See Comments)    Pt states she can not take this med but can not relate any side effect or incident that occurred  . Lisinopril Other (See Comments)    Unknown, pt says she "can't take it", cough/hypotension and thirst    Follow-up  Information    Lendon Colonel, NP Follow up on 03/30/2020.   Specialties: Nurse Practitioner, Radiology, Cardiology Why: @9 :45am for hospital follow up virtually phone/Video Contact information: [address] [place] [address] [phone]        Lucianne Lei, MD Follow up in 1 week(s).   Specialty: Family Medicine Why: Need BMP,  blood work to check kidney function in one week Contact information: [address] [place] [address] [phone]        Martinique, Peter M, MD .   Specialty: Cardiology Contact information: [address] [place] [address] [phone]                The results of significant diagnostics from this hospitalization (including imaging, microbiology, ancillary and laboratory) are listed below for reference.    Significant Diagnostic Studies: DG Chest 2 View  Result Date: 03/15/2020 CLINICAL DATA:  Chest pain EXAM: CHEST - 2 VIEW COMPARISON:  07/31/2018 FINDINGS: Normal heart size and mediastinal contours. No acute infiltrate or edema. No effusion or pneumothorax. Thoracic scoliosis. No acute osseous findings. IMPRESSION:  No active cardiopulmonary disease. Electronically Signed   By: Monte Fantasia M.D.   On: 03/15/2020 06:26   CT HEAD WO CONTRAST  Result Date: 03/06/2020 CLINICAL DATA:  Headache EXAM: CT HEAD WITHOUT CONTRAST TECHNIQUE: Contiguous axial images were obtained from the base of the skull through the vertex without intravenous contrast. COMPARISON:  12/21/2019 FINDINGS: Brain: There is no acute intracranial hemorrhage, mass effect, or edema. Gray-white differentiation is preserved. There is no extra-axial fluid collection. Ventricles and sulci are within normal limits in size and configuration. Vascular: There is atherosclerotic calcification at the skull base. Skull: Calvarium is unremarkable. Sinuses/Orbits: Right ethmoid mucosal thickening. Orbits are unremarkable. Other: None. IMPRESSION: No acute intracranial abnormality. Electronically Signed   By: Macy Mis M.D.   On: 03/06/2020 15:34   MR BRAIN WO CONTRAST  Result Date: 03/06/2020 CLINICAL DATA:  Headache and hypertension.  Nausea. EXAM: MRI HEAD WITHOUT CONTRAST TECHNIQUE: Multiplanar, multiecho pulse sequences of the brain and surrounding structures were obtained without intravenous contrast. COMPARISON:  Head CT same day.  MRI 12/21/2019 FINDINGS: Brain: Diffusion imaging does not show any acute or subacute infarction. There are chronic small-vessel ischemic changes throughout the pons. 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 Chronic small-vessel ischemic changes affect the thalami and the hemispheric white matter. No cortical or large vessel territory infarction. No mass lesion, hemorrhage, hydrocephalus or extra-axial collection. Vascular: Major vessels at the base of the brain show flow. Skull and upper cervical spine: Negative Sinuses/Orbits: Sinuses are clear except for some inflammatory change in the posterior ethmoid region on the right. Orbits are negative. Other: None IMPRESSION: No acute finding. Extensive chronic  small-vessel ischemic changes of the pons and thalami with lesser chronic ischemic changes of the cerebral hemispheric white matter and cerebellum. No significant change since April of this year. Electronically Signed   By: Nelson Chimes M.D.   On: 03/06/2020 18:21   CARDIAC CATHETERIZATION  Result Date: 03/16/2020  Prox RCA lesion is 20% stenosed.  Mid Cx to Dist Cx lesion is 20% stenosed.  Prox LAD lesion is 95% stenosed.  A drug-eluting stent was successfully placed using a SYNERGY XD 3.50X12.  Post intervention, there is a 0% residual stenosis.  1. Severe stenosis proximal LAD 2. Successful PTCA/DES x 1 proximal LAD 3. Mild non-obstructive disease in the Circumflex and RCA Recommendations: DAPT with ASA  and Plavix for one year. Continue statin and beta blocker. Likely d/c home tomorrow.   ECHOCARDIOGRAM COMPLETE  Result Date: 03/16/2020    ECHOCARDIOGRAM REPORT   Patient Name:   Anne [name] Date of Exam: 03/16/2020 Medical Rec #:  [id number]        Height:       65.0 in Accession #:    [id number]       Weight:       147.9 lb Date of Birth:  03/05/1946        BSA:          1.740 m Patient Age:    74 years         BP:           144/82 mmHg Patient Gender: F                HR:           66 bpm. Exam Location:  Inpatient Procedure: 2D Echo, Cardiac Doppler and Color Doppler Indications:    Acute Coronary Syndrome I24.9  History:        Patient has prior history of Echocardiogram examinations, most                 recent 10/01/2011. Risk Factors:Hypertension and Dyslipidemia.  Sonographer:    Tiffany Dance Referring Phys: [id number] O'Brien  1. Left ventricular ejection fraction, by estimation, is 55 to 60%. The left ventricle has normal function. The left ventricle has no regional wall motion abnormalities. There is mild left ventricular hypertrophy. Left ventricular diastolic parameters are consistent with Grade I diastolic dysfunction (impaired relaxation).  2. Right ventricular  systolic function is normal. The right ventricular size is normal. There is normal pulmonary artery systolic pressure. The estimated right ventricular systolic pressure is 70.2 mmHg.  3. Left atrial size was mildly dilated.  4. The mitral valve is normal in structure. No evidence of mitral valve regurgitation. No evidence of mitral stenosis.  5. The aortic valve is tricuspid. Aortic valve regurgitation is not visualized. No aortic stenosis is present.  6. The inferior vena cava is normal in size with greater than 50% respiratory variability, suggesting right atrial pressure of 3 mmHg. FINDINGS  Left Ventricle: Left ventricular ejection fraction, by estimation, is 55 to 60%. The left ventricle has normal function. The left ventricle has no regional wall motion abnormalities. The left ventricular internal cavity size was normal in size. There is  mild left ventricular hypertrophy. Left ventricular diastolic parameters are consistent with Grade I diastolic dysfunction (impaired relaxation). Right Ventricle: The right ventricular size is normal. No increase in right ventricular wall thickness. Right ventricular systolic function is normal. There is normal pulmonary artery systolic pressure. The tricuspid regurgitant velocity is 2.20 m/s, and  with an assumed right atrial pressure of 3 mmHg, the estimated right ventricular systolic pressure is 63.7 mmHg. Left Atrium: Left atrial size was mildly dilated. Right Atrium: Right atrial size was normal in size. Pericardium: There is no evidence of pericardial effusion. Mitral Valve: The mitral valve is normal in structure. No evidence of mitral valve regurgitation. No evidence of mitral valve stenosis. Tricuspid Valve: The tricuspid valve is normal in structure. Tricuspid valve regurgitation is mild. Aortic Valve: The aortic valve is tricuspid. Aortic valve regurgitation is not visualized. No aortic stenosis is present. Pulmonic Valve: The pulmonic valve was normal in  structure. Pulmonic valve regurgitation is not visualized. Aorta: The aortic root is normal in size and  structure. Venous: The inferior vena cava is normal in size with greater than 50% respiratory variability, suggesting right atrial pressure of 3 mmHg. IAS/Shunts: No atrial level shunt detected by color flow Doppler.  LEFT VENTRICLE PLAX 2D LVIDd:         4.20 cm  Diastology LVIDs:         3.30 cm  LV e' lateral:   6.00 cm/s LV PW:         0.90 cm  LV E/e' lateral: 11.5 LV IVS:        0.90 cm  LV e' medial:    5.11 cm/s LVOT diam:     1.80 cm  LV E/e' medial:  13.5 LV SV:         52 LV SV Index:   30 LVOT Area:     2.54 cm  RIGHT VENTRICLE            IVC RV Basal diam:  2.90 cm    IVC diam: 1.30 cm RV S prime:     8.76 cm/s TAPSE (M-mode): 2.0 cm LEFT ATRIUM             Index       RIGHT ATRIUM           Index LA diam:        3.40 cm 1.95 cm/m  RA Area:     13.80 cm LA Vol (A2C):   48.5 ml 27.87 ml/m RA Volume:   36.10 ml  20.75 ml/m LA Vol (A4C):   64.6 ml 37.12 ml/m LA Biplane Vol: 56.9 ml 32.70 ml/m  AORTIC VALVE LVOT Vmax:   89.70 cm/s LVOT Vmean:  58.300 cm/s LVOT VTI:    0.205 m  AORTA Ao Root diam: 3.20 cm Ao Asc diam:  3.50 cm MITRAL VALVE               TRICUSPID VALVE MV Area (PHT): 3.12 cm    TR Peak grad:   19.4 mmHg MV Decel Time: 243 msec    TR Vmax:        220.00 cm/s MV E velocity: 69.00 cm/s MV A velocity: 86.60 cm/s  SHUNTS MV E/A ratio:  0.80        Systemic VTI:  0.20 m                            Systemic Diam: 1.80 cm Loralie Champagne MD Electronically signed by Loralie Champagne MD Signature Date/Time: 03/16/2020/3:09:05 PM    Final     Microbiology: Recent Results (from the past 240 hour(s))  SARS Coronavirus 2 by RT PCR (hospital order, performed in [facility] hospital lab) Nasopharyngeal Nasopharyngeal Swab     Status: None   Collection Time: 03/15/20 10:06 AM   Specimen: Nasopharyngeal Swab  Result Value Ref Range Status   SARS Coronavirus 2 NEGATIVE NEGATIVE Final    Comment:  (NOTE) SARS-CoV-2 target nucleic acids are NOT DETECTED.  The SARS-CoV-2 RNA is generally detectable in upper and lower respiratory specimens during the acute phase of infection. The lowest concentration of SARS-CoV-2 viral copies this assay can detect is 250 copies / mL. A negative result does not preclude SARS-CoV-2 infection and should not be used as the sole basis for treatment or other patient management decisions.  A negative result may occur with improper specimen collection / handling, submission of specimen other than nasopharyngeal swab, presence of viral mutation(s) within the areas  targeted by this assay, and inadequate number of viral copies (<250 copies / mL). A negative result must be combined with clinical observations, patient history, and epidemiological information.  Fact Sheet for Patients:   StrictlyIdeas.no  Fact Sheet for Healthcare Providers: BankingDealers.co.za  This test is not yet approved or  cleared by the [place] FDA and has been authorized for detection and/or diagnosis of SARS-CoV-2 by FDA under an Emergency Use Authorization (EUA).  This EUA will remain in effect (meaning this test can be used) for the duration of the COVID-19 declaration under Section 564(b)(1) of the Act, 21 U.S.C. section 360bbb-3(b)(1), unless the authorization is terminated or revoked sooner.  Performed at [facility], [place] [address], [place], [place] [address]      Labs: Basic Metabolic Panel: Recent Labs  Lab 03/15/20 0602 03/16/20 0517 03/16/20 0617 03/17/20 0404  NA 139 140  --  140  K 3.4* 3.9  --  3.5  CL 103 107  --  107  CO2 27 22  --  25  GLUCOSE 113* 101*  --  108*  BUN 8 9  --  12  CREATININE 0.92 0.94  --  1.22*  CALCIUM 9.2 8.9  --  9.0  MG  --   --  2.0  --    Liver Function Tests: Recent Labs  Lab 03/15/20 0602  AST 22  ALT 13  ALKPHOS 52  BILITOT 0.9  PROT 6.8  ALBUMIN 3.7    No results for input(s): LIPASE, AMYLASE in the last 168 hours. No results for input(s): AMMONIA in the last 168 hours. CBC: Recent Labs  Lab 03/15/20 0602 03/16/20 0517 03/17/20 0404  WBC 3.0*  2.9* 4.0 6.0  NEUTROABS 1.5*  --   --   HGB 10.9*  10.9* 11.0* 10.7*  HCT 33.5*  33.8* 33.0* 32.0*  MCV 85.0  84.9 85.5 83.1  PLT 178  178 172 169    CBG: Recent Labs  Lab 03/16/20 1813  GLUCAP 132*       Signed:  Oswald Hillock MD.  [facility] 03/17/2020, 12:50 PM

## 2020-03-17 NOTE — Progress Notes (Signed)
CARDIAC REHAB PHASE I   PRE:  Rate/Rhythm: 89 SR  BP:  Supine: 164/88  Sitting:   Standing:    SaO2: 99%RA  MODE:  Ambulation: 470 ft   POST:  Rate/Rhythm: 107 ST  BP:  Supine:   Sitting: 127/86  Standing:    SaO2: 100%RA 0900-1020 Pt walked 470 ft on RA with rolling walker. Pt has walker at home if needed. Tolerated well. No CP. MI education completed with pt who voiced understanding. Stressed importance of plavix with stent. Reviewed NTG use, MI restrictions, walking for ex, heart healthy food choices and watching sodium, and CRP 2. Pt very interested in attending CRP 2. Referred to [place].    Graylon Good, RN BSN  03/17/2020 10:15 AM

## 2020-03-19 ENCOUNTER — Telehealth: Payer: Self-pay | Admitting: Physician Assistant

## 2020-03-19 NOTE — Telephone Encounter (Signed)
    Patient called to the on call service to report a high BP. She is very afraid she is going to have another heart attack. BP is labile. I made some suggestions which angered her because she said Dr. Martinique told her to strictly follow the medications listed on her discharge med list. I told her to just continue monitor at this time. She will call us back if Bp remains high.    Angelena Form PA-C  [facility]

## 2020-03-20 DIAGNOSIS — Z6825 Body mass index (BMI) 25.0-25.9, adult: Secondary | ICD-10-CM | POA: Diagnosis not present

## 2020-03-20 DIAGNOSIS — I10 Essential (primary) hypertension: Secondary | ICD-10-CM | POA: Diagnosis not present

## 2020-03-20 DIAGNOSIS — E781 Pure hyperglyceridemia: Secondary | ICD-10-CM | POA: Diagnosis not present

## 2020-03-20 DIAGNOSIS — F064 Anxiety disorder due to known physiological condition: Secondary | ICD-10-CM | POA: Diagnosis not present

## 2020-03-20 DIAGNOSIS — I214 Non-ST elevation (NSTEMI) myocardial infarction: Secondary | ICD-10-CM | POA: Diagnosis not present

## 2020-03-20 DIAGNOSIS — E782 Mixed hyperlipidemia: Secondary | ICD-10-CM | POA: Diagnosis not present

## 2020-03-20 DIAGNOSIS — F410 Panic disorder [episodic paroxysmal anxiety] without agoraphobia: Secondary | ICD-10-CM | POA: Diagnosis not present

## 2020-03-22 ENCOUNTER — Telehealth (HOSPITAL_COMMUNITY): Payer: Self-pay

## 2020-03-22 LAB — ALDOSTERONE + RENIN ACTIVITY W/ RATIO
ALDO / PRA Ratio: >83.2 — ABNORMAL HIGH (ref 0.0–30.0)
Aldosterone: 13.9 ng/dL (ref 0.0–30.0)
PRA LC/MS/MS: <0.167 ng/mL/hr — ABNORMAL LOW (ref 0.167–5.380)

## 2020-03-22 NOTE — Telephone Encounter (Signed)
Pt insurance is active and benefits verified through [employer]. Co-pay $10.00, DED $0.00/$0.00 met, out of pocket $3,100.00/$132.42 met, co-insurance 0%. No pre-authorization required. Janay/HTA, 03/22/20 @ 10:07AM, XLK#[id number]  Will contact patient to see if she is interested in the Cardiac Rehab Program. If interested, patient will need to complete follow up appt. Once completed, patient will be contacted for scheduling upon review by the RN Navigator.

## 2020-03-22 NOTE — Telephone Encounter (Signed)
Called patient to see if she is interested in the Cardiac Rehab Program. Patient expressed interest. Explained scheduling process and went over insurance, patient verbalized understanding. Will contact patient for scheduling once f/u has been completed. 

## 2020-03-23 ENCOUNTER — Telehealth: Payer: Self-pay | Admitting: Adult Health

## 2020-03-23 NOTE — Telephone Encounter (Signed)
Then I would go ahead and increase aldactone to 50 mg daily until she sees Curt Bears. This is the best medication to counter her primary hyperaldosteronism.  Alayha Babineaux Martinique MD, [facility]

## 2020-03-23 NOTE — Telephone Encounter (Signed)
Spoke with pt as well as nurse Suanne Marker for a long time Pt has concerns re elevated B/P ranging 178-181/89/98  HR running 69-75 in the am Pt is also complaining with h/a and dizziness when dizziness occurs this is usually in the pm when B/P is lower Per pt B/p is running 123/65 anywhere from 8:00 pm and later Pt also is complaining of "no control sensation " to left hand pt states it feels funny and is unable to use pt notes this is not new and was occurring while in hospital Appt with Jory Sims NP for Monday 03/27/20 at 9:15 am Will forward to Dr Martinique for review and recommendations./cy

## 2020-03-23 NOTE — Telephone Encounter (Signed)
Per pt has been taking meds as prescribed and has not missed any doses Pt also followed up with Dr Criss Rosales on Monday 03/20/20 and had labs done as well and per pt was told the following evening kidneys were good ./cy

## 2020-03-23 NOTE — Telephone Encounter (Signed)
I agree with recommendation but she ultimately needs to be seen to assess compliance with meds. She also sees Dr Criss Rosales so I don't know if she has seen her yet or had lab work done. If she had labs done we need to get a copy.  Anne Nyce Martinique MD, [facility]

## 2020-03-23 NOTE — Telephone Encounter (Signed)
New Message   Pt c/o BP issue: STAT if pt c/o blurred vision, one-sided weakness or slurred speech  1. What are your last 5 BP readings? 178/98, 181/89 HR 69   2. Are you having any other symptoms (ex. Dizziness, headache, blurred vision, passed out)? Dizziness and headache   3. What is your BP issue? Pt is calling with Faythe Dingwall from [facility]. Pt is concerned with her BP readings, she is feeling dizziness and has a headache. She says she has been having left hand problems and feels like she cannot control her fingers  Pt is wondering if he can be seen in the office today   Please advise

## 2020-03-23 NOTE — Telephone Encounter (Signed)
Pt aware of recommendations and agrees with plan ./cy 

## 2020-03-25 ENCOUNTER — Other Ambulatory Visit: Payer: Self-pay

## 2020-03-25 ENCOUNTER — Encounter (HOSPITAL_COMMUNITY): Payer: Self-pay | Admitting: *Deleted

## 2020-03-25 ENCOUNTER — Emergency Department (HOSPITAL_COMMUNITY)
Admission: EM | Admit: 2020-03-25 | Discharge: 2020-03-25 | Disposition: A | Discharge status: Home / Self Care | Payer: PPO | Attending: Emergency Medicine | Admitting: Emergency Medicine

## 2020-03-25 ENCOUNTER — Emergency Department (HOSPITAL_COMMUNITY): Payer: PPO

## 2020-03-25 DIAGNOSIS — R001 Bradycardia, unspecified: Secondary | ICD-10-CM | POA: Diagnosis not present

## 2020-03-25 DIAGNOSIS — R079 Chest pain, unspecified: Secondary | ICD-10-CM | POA: Diagnosis not present

## 2020-03-25 DIAGNOSIS — I700 Atherosclerosis of aorta: Secondary | ICD-10-CM | POA: Diagnosis not present

## 2020-03-25 DIAGNOSIS — I2510 Atherosclerotic heart disease of native coronary artery without angina pectoris: Secondary | ICD-10-CM | POA: Diagnosis not present

## 2020-03-25 DIAGNOSIS — M549 Dorsalgia, unspecified: Secondary | ICD-10-CM | POA: Insufficient documentation

## 2020-03-25 DIAGNOSIS — Z7982 Long term (current) use of aspirin: Secondary | ICD-10-CM | POA: Insufficient documentation

## 2020-03-25 DIAGNOSIS — E119 Type 2 diabetes mellitus without complications: Secondary | ICD-10-CM | POA: Insufficient documentation

## 2020-03-25 DIAGNOSIS — Z79899 Other long term (current) drug therapy: Secondary | ICD-10-CM | POA: Diagnosis not present

## 2020-03-25 DIAGNOSIS — R0789 Other chest pain: Secondary | ICD-10-CM | POA: Diagnosis not present

## 2020-03-25 DIAGNOSIS — I10 Essential (primary) hypertension: Secondary | ICD-10-CM | POA: Diagnosis not present

## 2020-03-25 DIAGNOSIS — M25511 Pain in right shoulder: Secondary | ICD-10-CM | POA: Diagnosis present

## 2020-03-25 DIAGNOSIS — R52 Pain, unspecified: Secondary | ICD-10-CM | POA: Diagnosis not present

## 2020-03-25 DIAGNOSIS — K8689 Other specified diseases of pancreas: Secondary | ICD-10-CM | POA: Diagnosis not present

## 2020-03-25 DIAGNOSIS — I517 Cardiomegaly: Secondary | ICD-10-CM | POA: Diagnosis not present

## 2020-03-25 DIAGNOSIS — N281 Cyst of kidney, acquired: Secondary | ICD-10-CM | POA: Diagnosis not present

## 2020-03-25 DIAGNOSIS — K862 Cyst of pancreas: Secondary | ICD-10-CM | POA: Diagnosis not present

## 2020-03-25 DIAGNOSIS — M546 Pain in thoracic spine: Secondary | ICD-10-CM | POA: Diagnosis not present

## 2020-03-25 DIAGNOSIS — J9811 Atelectasis: Secondary | ICD-10-CM | POA: Diagnosis not present

## 2020-03-25 LAB — CBC WITH DIFFERENTIAL/PLATELET
Abs Immature Granulocytes: 0.02 10*3/uL (ref 0.00–0.07)
Basophils Absolute: 0 10*3/uL (ref 0.0–0.1)
Basophils Relative: 0 %
Eosinophils Absolute: 0.1 10*3/uL (ref 0.0–0.5)
Eosinophils Relative: 2 %
HCT: 34.4 % — ABNORMAL LOW (ref 36.0–46.0)
Hemoglobin: 11.2 g/dL — ABNORMAL LOW (ref 12.0–15.0)
Immature Granulocytes: 0 %
Lymphocytes Relative: 21 %
Lymphs Abs: 1.1 10*3/uL (ref 0.7–4.0)
MCH: 27.4 pg (ref 26.0–34.0)
MCHC: 32.6 g/dL (ref 30.0–36.0)
MCV: 84.1 fL (ref 80.0–100.0)
Monocytes Absolute: 0.5 10*3/uL (ref 0.1–1.0)
Monocytes Relative: 10 %
Neutro Abs: 3.4 10*3/uL (ref 1.7–7.7)
Neutrophils Relative %: 67 %
Platelets: 205 10*3/uL (ref 150–400)
RBC: 4.09 MIL/uL (ref 3.87–5.11)
RDW: 13.5 % (ref 11.5–15.5)
WBC: 5.1 10*3/uL (ref 4.0–10.5)
nRBC: 0 % (ref 0.0–0.2)

## 2020-03-25 LAB — BASIC METABOLIC PANEL
Anion gap: 8 (ref 5–15)
BUN: 12 mg/dL (ref 8–23)
CO2: 26 mmol/L (ref 22–32)
Calcium: 9.4 mg/dL (ref 8.9–10.3)
Chloride: 106 mmol/L (ref 98–111)
Creatinine, Ser: 1.06 mg/dL — ABNORMAL HIGH (ref 0.44–1.00)
GFR calc Af Amer: >60 mL/min (ref >60)
GFR calc non Af Amer: 52 mL/min — ABNORMAL LOW (ref >60)
Glucose, Bld: 100 mg/dL — ABNORMAL HIGH (ref 70–99)
Potassium: 4.3 mmol/L (ref 3.5–5.1)
Sodium: 140 mmol/L (ref 135–145)

## 2020-03-25 LAB — TROPONIN I (HIGH SENSITIVITY)
Troponin I (High Sensitivity): 4 ng/L (ref <18)
Troponin I (High Sensitivity): 4 ng/L (ref <18)

## 2020-03-25 MED ORDER — IOHEXOL 350 MG/ML SOLN
80.0000 mL | Freq: Once | INTRAVENOUS | Status: AC | PRN
  Administered 2020-03-25: 80 mL via INTRAVENOUS

## 2020-03-25 NOTE — ED Notes (Signed)
Patient transported to CT 

## 2020-03-25 NOTE — ED Triage Notes (Signed)
The pt arrived [place] [facility] from home  Pt c/o posterior chest pain nausea lt arm and lr hand feels strange  No sob no chest pain  Pt had a 81mg  aspirin  By ems  Recent admission with chest pain and stent a and o x4

## 2020-03-25 NOTE — ED Provider Notes (Signed)
[facility] EMERGENCY DEPARTMENT Provider Note   CSN: [id number] Arrival date & time: 03/25/20  7510   History Chief Complaint  Patient presents with   Back Pain    Anne [name] is a 74 y.o. female.  The history is provided by the patient.  Back Pain She has a history of hypertension, hyperlipidemia, coronary artery disease, renal insufficiency and comes in complaining of pain in the right infrascapular area which started about 8 PM.  Pain is dull and she rates it at 7/10.  Nothing makes it better, nothing makes it worse.  She denies associated dyspnea, nausea, diaphoresis.  She is about 10 days status post placement of a cardiac stent.  Past Medical History:  Diagnosis Date   Complication of anesthesia    slow to wake up   Diverticulosis    Hiatal hernia    Hypercholesterolemia    Hypertension    Osteopenia    Vitamin D deficiency     Patient Active Problem List   Diagnosis Date Noted   NSTEMI (non-ST elevated myocardial infarction) ([place]) 03/15/2020   ACS (acute coronary syndrome) ([place])    Vertigo 12/21/2019   Weight loss, non-intentional 12/21/2019   Hypertensive urgency 12/21/2019   Renal insufficiency 12/21/2019   Pancreatic cyst 12/21/2019   History of diabetes mellitus 12/21/2019   Small bowel obstruction ([place]) 07/07/2015   SBO (small bowel obstruction) ([place]) 07/07/2015   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   Back pain 10/21/2014   Routine general medical examination at a health care facility 07/12/2014   Hearing loss 09/27/2012   Other screening mammogram 09/27/2012   Pure hypercholesterolemia 09/25/2012   Hypokalemia 08/31/2012   HTN (hypertension) 10/01/2011    Past Surgical History:  Procedure Laterality Date   ABDOMINAL HYSTERECTOMY     Partial   CORONARY STENT INTERVENTION N/A 03/16/2020   Procedure: CORONARY STENT INTERVENTION;  Surgeon: Burnell Blanks, MD;  Location: [place] [facility];  Service: Cardiovascular;  Laterality: N/A;   fibrocystic breast excision     LAPAROSCOPIC ILEOCECECTOMY  07/07/2015   LAPAROTOMY N/A 07/07/2015   Procedure: EXPLORATORY LAPAROTOMY WITH ILEOCECECTOMY ;  Surgeon: Donnie Mesa, MD;  Location: [place];  Service: General;  Laterality: N/A;   LEFT HEART CATH AND CORONARY ANGIOGRAPHY N/A 03/16/2020   Procedure: LEFT HEART CATH AND CORONARY ANGIOGRAPHY;  Surgeon: Burnell Blanks, MD;  Location: [place] [facility];  Service: Cardiovascular;  Laterality: N/A;   TUBAL LIGATION       OB History   No obstetric history on file.     Family History  Problem Relation Age of Onset   Stroke Mother    Hypertension Mother    Stroke Brother    Cancer Neg Hx    Alcohol abuse Neg Hx    Early death Neg Hx    Hearing loss Neg Hx    Heart disease Neg Hx    Hyperlipidemia Neg Hx    Kidney disease Neg Hx     Social History   Tobacco Use   Smoking status: Never Smoker   Smokeless tobacco: Never Used  Substance Use Topics   Alcohol use: Yes    Comment: 3 times a year   Drug use: No    Home Medications Prior to Admission medications   Medication Sig Start Date End Date Taking? Authorizing Provider  aspirin EC 81 MG EC tablet Take 1 tablet (81 mg total) by mouth daily. Swallow whole. 03/18/20   Eleonore Chiquito  S, MD  atorvastatin (LIPITOR) 80 MG tablet Take 1 tablet (80 mg total) by mouth daily. 03/18/20   Oswald Hillock, MD  B Complex-C (B-COMPLEX WITH VITAMIN C) tablet Take 1 tablet by mouth daily.    [provider]  carvedilol (COREG) 3.125 MG tablet Take 1 tablet (3.125 mg total) by mouth 2 (two) times daily with a meal. 03/17/20   Oswald Hillock, MD  clopidogrel (PLAVIX) 75 MG tablet Take 1 tablet (75 mg total) by mouth daily with breakfast. 03/18/20   Oswald Hillock, MD  GARLIC PO Take 2 tablets by mouth daily.     [provider]  Magnesium 300 MG CAPS Take 300 mg by mouth daily.    [provider]   meclizine (ANTIVERT) 25 MG tablet Take 1 tablet (25 mg total) by mouth 3 (three) times daily as needed for dizziness. Patient not taking: Reported on 03/15/2020 12/22/19   Patrecia Pour, MD  Multiple Vitamins-Minerals (MULTIVITAMIN WITH MINERALS) tablet Take 1 tablet by mouth daily.    [provider]  OVER THE COUNTER MEDICATION Take 2,000 Units by mouth daily. Vitamin D "vegetable blend"    [provider]  spironolactone (ALDACTONE) 25 MG tablet Take 1 tablet (25 mg total) by mouth daily. 03/17/20 03/17/21  Oswald Hillock, MD    Allergies    Amiloride; Anesthetics, amide; Other; Asa [aspirin]; Diltiazem hcl; Eplerenone; Indomethacin; Naproxen; Norvasc [amlodipine besylate]; Tenex [guanfacine hcl]; Triamterene-hctz; Potassium chloride; Hytrin [terazosin hcl]; and Lisinopril  Review of Systems   Review of Systems  Musculoskeletal: Positive for back pain.  All other systems reviewed and are negative.   Physical Exam Updated Vital Signs BP (!) 153/88 (BP Location: Right Arm)    Pulse 63    Temp 98.6 F (37 C) (Oral)    Resp 11    Ht 5\' 5"  (1.651 m)    Wt 69.7 kg    SpO2 98%    BMI 25.57 kg/m   Physical Exam Vitals and nursing note reviewed.   74 year old female, resting comfortably and in no acute distress. Vital signs are significant for elevated blood pressure. Oxygen saturation is 98%, which is normal. Head is normocephalic and atraumatic. PERRLA, EOMI. Oropharynx is clear. Neck is nontender and supple without adenopathy or JVD. Back has rather well localized tenderness in the right infrascapular area just lateral to the spinal column.  There is no CVA tenderness. Lungs are clear without rales, wheezes, or rhonchi. Chest is nontender. Heart has regular rate and rhythm without murmur. Abdomen is soft, flat, nontender without masses or hepatosplenomegaly and peristalsis is normoactive. Extremities have no cyanosis or edema, full range of motion is present. Skin is warm  and dry without rash. Neurologic: Mental status is normal, cranial nerves are intact, there are no motor or sensory deficits.  ED Results / Procedures / Treatments   Labs (all labs ordered are listed, but only abnormal results are displayed) Labs Reviewed - No data to display  EKG None  Radiology No results found.  Procedures Procedures  Medications Ordered in ED Medications - No data to display  ED Course  I have reviewed the triage vital signs and the nursing notes.  Pertinent labs & imaging results that were available during my care of the patient were reviewed by me and considered in my medical decision making (see chart for details).  MDM Rules/Calculators/A&P Upper back pain which is probably musculoskeletal in origin.  Elevated blood pressure.  Old  records reviewed confirming cardiac catheterization on 7/8 with placement of a stent in the proximal LAD.  There were also minor stenoses involving the RCA, middle circumflex, distal circumflex with none of those greater than 20% stenosis.  Blood pressure in both arms was checked and there was a 14 mm difference in systolic blood pressure (right arm 178/95, left arm 164/84).  Given known history of atherosclerotic disease and significant blood pressure difference, will send for CT angiogram to rule out thoracic aortic aneurysm.  Of note, CT of abdomen and pelvis on 12/21/2019 showed aortic atherosclerosis without any abdominal aneurysm.  Labs and CT scan are still pending.  Case is signed out to Dr. Vanita Panda.  Final Clinical Impression(s) / ED Diagnoses Final diagnoses:  Upper back pain on right side  Elevated blood pressure reading with diagnosis of hypertension    Rx / DC Orders ED Discharge Orders    None       Delora Fuel, MD 21/62/44 [phone]

## 2020-03-25 NOTE — ED Provider Notes (Signed)
11:38 AM Care of the patient assumed at signout.  CT angiography was pending, and the results are now reassuring, available. On exam the patient is awake, alert, speaking clearly, in no distress. I conveyed the CT imaging results, the patient notes that she has a follow-up in 2 days with her physician.  Given the absence of distress, hemodynamic instability, she was discharged in stable condition.   Carmin Muskrat, MD 03/25/20 1139

## 2020-03-25 NOTE — Discharge Instructions (Signed)
As discussed, your evaluation today has been largely reassuring.  But, it is important that you monitor your condition carefully, and do not hesitate to return to the ED if you develop new, or concerning changes in your condition. ? ?Otherwise, please follow-up with your physician for appropriate ongoing care. ? ?

## 2020-03-25 NOTE — ED Notes (Signed)
Patient verbalizes understanding of discharge instructions. Opportunity for questioning and answers were provided. Armband removed by staff, pt discharged from ED to home 

## 2020-03-25 NOTE — ED Notes (Signed)
Placed a external cath patient is resting with call bell in reach 

## 2020-03-26 NOTE — Progress Notes (Signed)
Cardiology Office Note   Date:  03/27/2020   ID:  Anne [name], DOB 14-Apr-1946, MRN [id number]  PCP:  Lucianne Lei, MD  Cardiologist:  Dr.Jordan   Chief Complaint  Patient presents with  . Edema    left hand     History of Present Illness: Anne [name] is a 74 y.o.  female who presents for posthospitalization follow-up with known history of chronic severe hypertension, hypercholesterolemia, hypokalemia, medical noncompliance, who was initially admitted with NSTEMI.  She had a cardiac catheterization which revealed severe proximal LAD stenosis status post PTCA and drug-eluting stent.  She was placed on dual antiplatelet therapy with aspirin and Plavix to continue for 1 year, she was also continued on statin therapy and beta-blocker.   Her blood pressure did normalize with beta-blocker and losartan.  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    Also noted to have acute kidney injury status post PCI she was encouraged to have hydration and will need a follow-up today if not completed by PCP.  She will need follow-up lipids and LFTs in 8 weeks.  She called our office on 03/23/2020 with complaints of elevated blood pressure requesting an appointment.  The patient had called 3 days earlier with same complaints and was told to continue her current medication regimen with adjustments however she did not wish to adjust her medications.  Dr. Martinique recommended to increase Aldactone to 50 mg daily until seen today in the office.  She was seen in the ED on 03/25/2020 for complaints of back pain which was felt to be musculoskeletal in etiology.  CT of the abdomen pelvis which had been performed and April 2021 showed aortic atherosclerosis without any abdominal aneurysm.  She comes today with multiple questions and concerns.  She has been having weakness in her left hand since MI. She can move her hand but has complaints of decreased coordination and and strength in that hand.  She  returned to the ED on 03/25/2020 with complaints in the infrascapular area, described as dull 7/10.  She denied any associated dyspnea or diaphoresis.  No nausea.  She was found to be hypertensive with right arm 178/95, left arm 164/84.  CT scan was completed to rule out thoracic aortic aneurysm.  CT did not reveal any evidence of aortic aneurysm.  Trace fluid within the pelvic cul-de-sac, tiny cystic lesions within the pancreas as seen previously.  She was released and is here today for post hospital follow-up and ED follow-up.  She also have multiple questions concerning her catheterization, her medications, and hyperaldosteronism.  She has recently been placed on spironolactone due to hypokalemia.  She denies chest pain, dyspnea on exertion, but she does feel unsteady on her feet.  She has been walking every day adding a minute to her walk as directed.  She is also interested in cardiac rehab.   Past Medical History:  Diagnosis Date  . Complication of anesthesia    slow to wake up  . Diverticulosis   . Hiatal hernia   . Hypercholesterolemia   . Hypertension   . Osteopenia   . Vitamin D deficiency     Past Surgical History:  Procedure Laterality Date  . ABDOMINAL HYSTERECTOMY     Partial  . CORONARY STENT INTERVENTION N/A 03/16/2020   Procedure: CORONARY STENT INTERVENTION;  Surgeon: Burnell Blanks, MD;  Location: [place] [facility];  Service: Cardiovascular;  Laterality: N/A;  . fibrocystic breast excision    . LAPAROSCOPIC ILEOCECECTOMY  07/07/2015  . LAPAROTOMY N/A 07/07/2015   Procedure: EXPLORATORY LAPAROTOMY WITH ILEOCECECTOMY ;  Surgeon: Donnie Mesa, MD;  Location: [place];  Service: General;  Laterality: N/A;  . LEFT HEART CATH AND CORONARY ANGIOGRAPHY N/A 03/16/2020   Procedure: LEFT HEART CATH AND CORONARY ANGIOGRAPHY;  Surgeon: Burnell Blanks, MD;  Location: [place] [facility];  Service: Cardiovascular;  Laterality: N/A;  . TUBAL LIGATION       Current  Outpatient Medications  Medication Sig Dispense Refill  . aspirin EC 81 MG EC tablet Take 1 tablet (81 mg total) by mouth daily. Swallow whole. 30 tablet 11  . atorvastatin (LIPITOR) 80 MG tablet Take 1 tablet (80 mg total) by mouth daily. 30 tablet 3  . B Complex-C (B-COMPLEX WITH VITAMIN C) tablet Take 1 tablet by mouth daily.    . carvedilol (COREG) 3.125 MG tablet Take 1 tablet (3.125 mg total) by mouth 2 (two) times daily with a meal. 60 tablet 2  . clopidogrel (PLAVIX) 75 MG tablet Take 1 tablet (75 mg total) by mouth daily with breakfast. 30 tablet 3  . GARLIC PO Take 2 tablets by mouth daily.     . Magnesium 300 MG CAPS Take 300 mg by mouth daily.    . meclizine (ANTIVERT) 25 MG tablet Take 1 tablet (25 mg total) by mouth 3 (three) times daily as needed for dizziness. 30 tablet 0  . Multiple Vitamins-Minerals (MULTIVITAMIN WITH MINERALS) tablet Take 1 tablet by mouth daily.    Marland Kitchen OVER THE COUNTER MEDICATION Take 2,000 Units by mouth daily. Vitamin D "vegetable blend"    . spironolactone (ALDACTONE) 25 MG tablet Take 1 tablet (25 mg total) by mouth daily. 30 tablet 11   No current facility-administered medications for this visit.    Allergies:   Amiloride; Anesthetics, amide; Other; Asa [aspirin]; Diltiazem hcl; Eplerenone; Indomethacin; Naproxen; Norvasc [amlodipine besylate]; Tenex [guanfacine hcl]; Triamterene-hctz; Potassium chloride; Hytrin [terazosin hcl]; and Lisinopril    Social History:  The patient  reports that she has never smoked. She has never used smokeless tobacco. She reports current alcohol use. She reports that she does not use drugs.   Family History:  The patient's family history includes Hypertension in her mother; Stroke in her brother and mother.    ROS: All other systems are reviewed and negative. Unless otherwise mentioned in H&P    PHYSICAL EXAM: VS:  BP 132/82 (BP Location: Left Arm, Patient Position: Sitting, Cuff Size: Normal)   Pulse 81   Ht 5\' 5"   (1.651 m)   Wt 146 lb 6.4 oz (66.4 kg)   SpO2 95%   BMI 24.36 kg/m  , BMI Body mass index is 24.36 kg/m. GEN: Well nourished, well developed, in no acute distress HEENT: normal Neck: no JVD, carotid bruits, or masses Cardiac: RRR; no murmurs, rubs, or gallops,no edema  Respiratory:  Clear to auscultation bilaterally, normal work of breathing GI: soft, nontender, nondistended, + BS MS: no deformity or atrophy, hand grips are unequal. Skin: warm and dry, no rash Neuro:  Left sided grip is weaker than right side. Good sensation able to move fingers and wrist. No discoloration.  Psych: euthymic mood, full affect, slightly anxious   EKG: Not completed this office visit.    Recent Labs: 12/21/2019: TSH 2.747 03/15/2020: ALT 13 03/16/2020: Magnesium 2.0 03/25/2020: BUN 12; Creatinine, Ser 1.06; Hemoglobin 11.2; Platelets 205; Potassium 4.3; Sodium 140    Lipid Panel    Component Value Date/Time   CHOL 195  03/16/2020 0517   TRIG 57 03/16/2020 0517   HDL 61 03/16/2020 0517   CHOLHDL 3.2 03/16/2020 0517   VLDL 11 03/16/2020 0517   LDLCALC 123 (H) 03/16/2020 0517   LDLDIRECT 158.7 09/30/2013 1255      Wt Readings from Last 3 Encounters:  03/27/20 146 lb 6.4 oz (66.4 kg)  03/25/20 153 lb 10.6 oz (69.7 kg)  03/17/20 153 lb 10.6 oz (69.7 kg)      Other studies Reviewed: LHC 03/16/2020  Prox RCA lesion is 20% stenosed.  Mid Cx to Dist Cx lesion is 20% stenosed.  Prox LAD lesion is 95% stenosed.  A drug-eluting stent was successfully placed using a SYNERGY XD 3.50X12.  Post intervention, there is a 0% residual stenosis.   1. Severe stenosis proximal LAD 2. Successful PTCA/DES x 1 proximal LAD 3. Mild non-obstructive disease in the Circumflex and RCA  Recommendations: DAPT with ASA and Plavix for one year. Continue statin and beta blocker.     ASSESSMENT AND PLAN:  1.  Coronary artery disease: Status post NSTEMI.  Catheterization revealing severe proximal LAD stenosis,  requiring PTCA and drug-eluting stent.  She offers no further complaints of chest pain or shortness of breath.  She is medically compliant at this time and will remain on dual antiplatelet therapy for a minimum of 1 year, and secondary prevention with statin therapy.  2.  Left hand weakness: She did have this occur during hospitalization.  This was negative for acute CVA.  She was found to have extensive chronic small vessel ischemic changes of the pons, the thalami, with lesser chronic ischemic changes of the cerebral hemispheric white matter and cerebellum.  It was unchanged since her prior CT scan in April 2021.  She may need to have a nerve conduction study versus further evaluation if this persists.  She did state that it is getting some better but it is taking a long time.  She will discuss this further with her PCP on follow-up.  3.  Hypertension: Blood pressure was severely elevated on arrival to the ED initially, with an additional ED visit 10 days after discharge due to hypertension and subscapular pain.Marland Kitchen  She will continue on carvedilol 3.125 mg twice daily and spironolactone 25 mg daily.  BMET will be drawn today. BP is stable today's visit.   4.  Hyperaldosteronism. CT scan of abdomen and pelvis.on 12/21/2019 did not find adenoma.  She has been placed on spironolactone to aldosterone blocking properties. She will follow up with PCP for ongoing work up. Checking BMET for potassium level.   5. Hyperlipidemia: She will continue on atorvastatin 80 mg daily.  Follow-up lipids and LFTs should be completed in 6 weeks.  Labs/ tests ordered today include: BMET   Current medicines are reviewed at length with the patient today.  I have spent 40 minutes  dedicated to the care of this patient on the date of this encounter to include pre-visit review of records, assessment, management and diagnostic testing,with shared decision making.   Phill Myron. West Pugh, ANP, [facility]   03/27/2020 10:11 AM      [facility] [address] Office [phone] Fax [phone]  Notice: This dictation was prepared with Dragon dictation along with smaller phrase technology. Any transcriptional errors that result from this process are unintentional and may not be corrected upon review.

## 2020-03-27 ENCOUNTER — Encounter: Payer: Self-pay | Admitting: Adult Health

## 2020-03-27 ENCOUNTER — Telehealth: Payer: PPO | Admitting: Adult Health

## 2020-03-27 ENCOUNTER — Other Ambulatory Visit: Payer: Self-pay

## 2020-03-27 VITALS — BP 132/82 | HR 81 | Ht 65.0 in | Wt 146.4 lb

## 2020-03-27 DIAGNOSIS — Z79899 Other long term (current) drug therapy: Secondary | ICD-10-CM | POA: Diagnosis not present

## 2020-03-27 DIAGNOSIS — I10 Essential (primary) hypertension: Secondary | ICD-10-CM

## 2020-03-27 DIAGNOSIS — E269 Hyperaldosteronism, unspecified: Secondary | ICD-10-CM

## 2020-03-27 DIAGNOSIS — I2510 Atherosclerotic heart disease of native coronary artery without angina pectoris: Secondary | ICD-10-CM | POA: Diagnosis not present

## 2020-03-27 DIAGNOSIS — E7800 Pure hypercholesterolemia, unspecified: Secondary | ICD-10-CM | POA: Diagnosis not present

## 2020-03-27 NOTE — Patient Instructions (Signed)
Medication Instructions:  Continue current medications  *If you need a refill on your cardiac medications before your next appointment, please call your pharmacy*   Lab Work: BMP Today  If you have labs (blood work) drawn today and your tests are completely normal, you will receive your results only by: Marland Kitchen MyChart Message (if you have MyChart) OR . A paper copy in the mail If you have any lab test that is abnormal or we need to change your treatment, we will call you to review the results.   Testing/Procedures: None Ordered   Follow-Up: At [facility], you and your health needs are our priority.  As part of our continuing mission to provide you with exceptional heart care, we have created designated Provider Care Teams.  These Care Teams include your primary Cardiologist (physician) and Advanced Practice Providers (APPs -  Physician Assistants and Nurse Practitioners) who all work together to provide you with the care you need, when you need it.  We recommend signing up for the patient portal called "MyChart".  Sign up information is provided on this After Visit Summary.  MyChart is used to connect with patients for Virtual Visits (Telemedicine).  Patients are able to view lab/test results, encounter notes, upcoming appointments, etc.  Non-urgent messages can be sent to your provider as well.   To learn more about what you can do with MyChart, go to [email or web address].    Your next appointment:   1 month(s)  The format for your next appointment:   In Person  Provider:   You may see Peter Martinique, MD or one of the following Advanced Practice Providers on your designated Care Team:    Almyra Deforest, PA-C  Fabian Sharp, PA-C or   Roby Lofts, [place]

## 2020-03-28 LAB — BASIC METABOLIC PANEL
BUN/Creatinine Ratio: 13 (ref 12–28)
BUN: 12 mg/dL (ref 8–27)
CO2: 21 mmol/L (ref 20–29)
Calcium: 9.6 mg/dL (ref 8.7–10.3)
Chloride: 102 mmol/L (ref 96–106)
Creatinine, Ser: 0.96 mg/dL (ref 0.57–1.00)
GFR calc Af Amer: 68 mL/min/{1.73_m2} (ref >59)
GFR calc non Af Amer: 59 mL/min/{1.73_m2} — ABNORMAL LOW (ref >59)
Glucose: 86 mg/dL (ref 65–99)
Potassium: 4.8 mmol/L (ref 3.5–5.2)
Sodium: 140 mmol/L (ref 134–144)

## 2020-03-30 ENCOUNTER — Telehealth: Payer: PPO | Admitting: Adult Health

## 2020-03-30 ENCOUNTER — Telehealth: Payer: Self-pay | Admitting: Cardiology

## 2020-03-30 DIAGNOSIS — M545 Low back pain: Secondary | ICD-10-CM | POA: Diagnosis not present

## 2020-03-30 MED ORDER — NITROGLYCERIN 0.4 MG SL SUBL
0.4000 mg | SUBLINGUAL_TABLET | SUBLINGUAL | 3 refills | Status: DC | PRN
Start: 2020-03-30 — End: 2021-03-01

## 2020-03-30 NOTE — Telephone Encounter (Signed)
Okay to fill nitrostat 0.4 mg #25.  1 tab prn chest pain, may repeat x 2.  Call 911 if need 3rd dose.

## 2020-03-30 NOTE — Addendum Note (Signed)
Addended by: Valeta Harms on: 03/30/2020 04:25 PM   Modules accepted: Orders

## 2020-03-30 NOTE — Telephone Encounter (Signed)
Rhonda RN from [facility] [facility] is calling because Palmer said she was told by someone at the hospital during her hospitalization for a heart attack and stent placement, that she would have nitro. She is wondering if she needs to be on it, and if so, can she get it. If any questions for Suanne Marker, she can be reached at [phone].

## 2020-03-30 NOTE — Telephone Encounter (Signed)
Called and spoke with pt, she reports that the nurse had discussed her having nitro when she left the hospital and she did not realize it was not on her discharge list until [place] from [employer] caught it. Reviewed [facility] recommendations for nitrostat with pt who verbalized understanding with addition of medication as well as instructions on how to take medication and when to call 911. She would like medication send to [facility] on [address]. No other questions from pt at this time. Will send in prescription

## 2020-04-07 ENCOUNTER — Telehealth (HOSPITAL_COMMUNITY): Payer: Self-pay

## 2020-04-07 NOTE — Telephone Encounter (Signed)
Called patient to see if she was interested in participating in the Cardiac Rehab Program. Patient stated yes. Patient will come in for orientation on 05/02/20 @ 8AM and will attend the 845AM exercise class.  Estate agent.

## 2020-04-10 ENCOUNTER — Ambulatory Visit: Payer: PPO | Admitting: Physical Therapy

## 2020-04-14 DIAGNOSIS — M545 Low back pain: Secondary | ICD-10-CM | POA: Diagnosis not present

## 2020-04-18 ENCOUNTER — Telehealth: Payer: Self-pay | Admitting: Adult Health

## 2020-04-18 ENCOUNTER — Encounter (HOSPITAL_COMMUNITY): Payer: Self-pay | Admitting: Emergency Medicine

## 2020-04-18 ENCOUNTER — Emergency Department (HOSPITAL_COMMUNITY): Payer: PPO

## 2020-04-18 ENCOUNTER — Other Ambulatory Visit: Payer: Self-pay

## 2020-04-18 ENCOUNTER — Emergency Department (HOSPITAL_COMMUNITY)
Admission: EM | Admit: 2020-04-18 | Discharge: 2020-04-19 | Disposition: A | Discharge status: Home / Self Care | Payer: PPO | Attending: Emergency Medicine | Admitting: Emergency Medicine

## 2020-04-18 DIAGNOSIS — Z5321 Procedure and treatment not carried out due to patient leaving prior to being seen by health care provider: Secondary | ICD-10-CM | POA: Insufficient documentation

## 2020-04-18 DIAGNOSIS — R55 Syncope and collapse: Secondary | ICD-10-CM | POA: Diagnosis not present

## 2020-04-18 DIAGNOSIS — R42 Dizziness and giddiness: Secondary | ICD-10-CM | POA: Insufficient documentation

## 2020-04-18 DIAGNOSIS — I517 Cardiomegaly: Secondary | ICD-10-CM | POA: Diagnosis not present

## 2020-04-18 LAB — BASIC METABOLIC PANEL
Anion gap: 10 (ref 5–15)
BUN: 7 mg/dL — ABNORMAL LOW (ref 8–23)
CO2: 25 mmol/L (ref 22–32)
Calcium: 9.5 mg/dL (ref 8.9–10.3)
Chloride: 105 mmol/L (ref 98–111)
Creatinine, Ser: 0.88 mg/dL (ref 0.44–1.00)
GFR calc Af Amer: >60 mL/min (ref >60)
GFR calc non Af Amer: >60 mL/min (ref >60)
Glucose, Bld: 91 mg/dL (ref 70–99)
Potassium: 3.9 mmol/L (ref 3.5–5.1)
Sodium: 140 mmol/L (ref 135–145)

## 2020-04-18 LAB — CBC
HCT: 31.9 % — ABNORMAL LOW (ref 36.0–46.0)
Hemoglobin: 10.4 g/dL — ABNORMAL LOW (ref 12.0–15.0)
MCH: 27.4 pg (ref 26.0–34.0)
MCHC: 32.6 g/dL (ref 30.0–36.0)
MCV: 83.9 fL (ref 80.0–100.0)
Platelets: 182 10*3/uL (ref 150–400)
RBC: 3.8 MIL/uL — ABNORMAL LOW (ref 3.87–5.11)
RDW: 14.1 % (ref 11.5–15.5)
WBC: 3.6 10*3/uL — ABNORMAL LOW (ref 4.0–10.5)
nRBC: 0 % (ref 0.0–0.2)

## 2020-04-18 LAB — TROPONIN I (HIGH SENSITIVITY)
Troponin I (High Sensitivity): 5 ng/L (ref <18)
Troponin I (High Sensitivity): 5 ng/L (ref <18)

## 2020-04-18 NOTE — ED Triage Notes (Signed)
Pt arrives to ED via POV with c/o of lightheaded and woke up this am "sweaty" Pt states she when got up she was feeling ok just sweaty. Pt states she walked into the kitchen to get water when she began to feel dizzy and like she may pass out. Pt then took BP and noticed and in 391S systolic. Pt then took one Nitroglycerin and dropped to 150's. Pt states around lunch this happened again with feelings of lightheaded and took another nitro tablet. Pt currently is sitting in wheelchair and states she does feel dizzy. Pt denies any chest pain.

## 2020-04-18 NOTE — Telephone Encounter (Signed)
    STAT if patient feels like he/she is going to faint   1) Are you dizzy now? Yes  2) Do you feel faint or have you passed out? She been feeling she about to pass out  3) Do you have any other symptoms? Lightheadedness, weakness  4) Have you checked your HR and BP (record if available)? 199/98, 155/90, 151/85, 182/90   Pt said her legs been swollen, this morning after going to the bathroom she felt really dizzy and feels she about to pass out her BP 199/98 HR 99 she said she took nitroglycerin and waited 5 mins her BP became 155/90. She ate breakfast and she felt weak again and feeling faint she check her BP again it went back to 182/90.

## 2020-04-18 NOTE — ED Notes (Signed)
Pt decided to leave AMA.  

## 2020-04-18 NOTE — Telephone Encounter (Signed)
Spoke with patient of Dr. Martinique who c/o elevated BP, dizziness and headache. She has history of heart disease  She has taken NTG prn twice daily for high BP - she said she received this info at hospital discharge to use for high BP/headache She denies chest pain  Early this morning, she felt like she would pass out, BP was 199/98 and HR 99 - took NTG at this time - sat and rested 6-7 minutes and BP dropped to 155/90 Ate oatmeal and then took medications - checked BP again 151/85 Patient has not exerted herself today, been resting She reports headache, dizziness while sitting and BP is back up to 673 systolic right before phone call Current BP is 197/103 - took another NTG She contemplated calling EMS but did not, when her BP decreased  She states she is having to urinate every 10 minutes  She also reports swelling in her feet  Advised she call EMS - she said with her insurance, she would have to pay EMS bill of $300 if they do not transport her to hospital  Advised she should not drive to hospital herself, with feeling like she will pass out.   Advised that she go to [facility] for an evaluation - she will have someone drive her  Notified triage nurse at [facility] ED

## 2020-04-19 DIAGNOSIS — E785 Hyperlipidemia, unspecified: Secondary | ICD-10-CM | POA: Diagnosis not present

## 2020-04-19 DIAGNOSIS — I10 Essential (primary) hypertension: Secondary | ICD-10-CM | POA: Diagnosis not present

## 2020-04-19 DIAGNOSIS — I252 Old myocardial infarction: Secondary | ICD-10-CM | POA: Diagnosis not present

## 2020-04-21 ENCOUNTER — Telehealth: Payer: PPO | Admitting: Adult Health

## 2020-04-25 ENCOUNTER — Telehealth (HOSPITAL_COMMUNITY): Payer: Self-pay | Admitting: Pharmacist

## 2020-04-26 ENCOUNTER — Telehealth (HOSPITAL_COMMUNITY): Payer: Self-pay

## 2020-04-26 NOTE — Telephone Encounter (Signed)
Cardiac Rehab Note:  Unsuccessful telephone encounter to [facility] S. Andal to confirm cardiac rehab orientation appointment for Tuesday 05/02/20 at 8:00 am. [employer] compliant VM message left requesting call back to [phone].  Brendi Mccarroll E. Rollene Rotunda RN, BSN [place]. [facility]  Cardiac and Pulmonary Rehabilitation Phone: [phone] Fax: [phone]

## 2020-04-27 NOTE — Telephone Encounter (Signed)
Cardiac Rehab - Pharmacy Resident Documentation   Patient unable to be reached after three call attempts. Please complete allergy verification and medication review during patient's cardiac rehab appointment.   Fara Olden, PharmD PGY-1 Ambulatory Care Pharmacy Resident 04/27/2020 5:19 PM

## 2020-04-27 NOTE — Progress Notes (Signed)
Cardiology Office Note   Date:  05/01/2020   ID:  Anne [name], DOB [birth year], MRN [id number]  PCP:  Johna Roles, PA  Cardiologist:   Arend Bahl Martinique, MD   Chief Complaint  Patient presents with  . Coronary Artery Disease  . Hypertension      History of Present Illness: Anne [name] is a 74 y.o. 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 She was admitted in early July with NSTEMI. Peak troponin 490. Cardiac cath showed a high grade proximal LAD stenosis that was treated with DES. She also was severely hypertensive. 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 She was placed on DAPT and aldactone. The patient does have a history of noncompliance.   She returned to the ED on 03/25/2020 with complaints in the infrascapular area, described as dull 7/10.  She denied any associated dyspnea or diaphoresis.  No nausea.  She was found to be hypertensive with right arm 178/95, left arm 164/84.  CT scan was completed to rule out thoracic aortic aneurysm.  CT did not reveal any evidence of aortic aneurysm.  Trace fluid within the pelvic cul-de-sac, tiny cystic lesions within the pancreas as seen previously.   She went back to the ED on 8/10. States her BP shot up to crazy high levels. Felt she was going to black out. Took sl Ntg with improvement in BP. Labs in ED were OK including troponin x 2. She left before being seen.   Currently she is without chest pain or SOB. She has some intermittent hand numbness and pain and with this has difficult time controlling her fingers. She is still fixated on the issue of hair loss with her medication.    Past Medical History:  Diagnosis Date  . Complication of anesthesia    slow to wake up  . Diverticulosis   . Hiatal hernia   . Hypercholesterolemia   . Hypertension   . Osteopenia   . Vitamin D deficiency     Past Surgical History:  Procedure  Laterality Date  . ABDOMINAL HYSTERECTOMY     Partial  . CORONARY STENT INTERVENTION N/A 03/16/2020   Procedure: CORONARY STENT INTERVENTION;  Surgeon: Burnell Blanks, MD;  Location: [facility];  Service: Cardiovascular;  Laterality: N/A;  . fibrocystic breast excision    . LAPAROSCOPIC ILEOCECECTOMY  07/07/2015  . LAPAROTOMY N/A 07/07/2015   Procedure: EXPLORATORY LAPAROTOMY WITH ILEOCECECTOMY ;  Surgeon: Donnie Mesa, MD;  Location: [facility];  Service: General;  Laterality: N/A;  . LEFT HEART CATH AND CORONARY ANGIOGRAPHY N/A 03/16/2020   Procedure: LEFT HEART CATH AND CORONARY ANGIOGRAPHY;  Surgeon: Burnell Blanks, MD;  Location: [facility];  Service: Cardiovascular;  Laterality: N/A;  . TUBAL LIGATION       Current Outpatient Medications  Medication Sig Dispense Refill  . aspirin EC 81 MG EC tablet Take 1 tablet (81 mg total) by mouth daily. Swallow whole. 30 tablet 11  . atorvastatin (LIPITOR) 80 MG tablet Take 1 tablet (80 mg total) by mouth daily. 30 tablet 3  . B Complex-C (B-COMPLEX WITH VITAMIN C) tablet Take 1 tablet by mouth daily.    . carvedilol (COREG) 3.125 MG tablet Take 1 tablet (3.125 mg total) by mouth 2 (two) times daily with a meal. 60 tablet 2  . clopidogrel (PLAVIX) 75 MG tablet Take 1 tablet (75 mg total) by mouth daily with breakfast.  30 tablet 3  . GARLIC PO Take 2 tablets by mouth daily.     . Magnesium 200 MG TABS Take 200 tablets by mouth.    . Multiple Vitamins-Minerals (MULTIVITAMIN WITH MINERALS) tablet Take 1 tablet by mouth daily.    . nitroGLYCERIN (NITROSTAT) 0.4 MG SL tablet Place 1 tablet (0.4 mg total) under the tongue every 5 (five) minutes as needed for chest pain. May repeat 2 times, if 3rd dose needed call 911 25 tablet 3  . spironolactone (ALDACTONE) 25 MG tablet Take 1 tablet (25 mg total) by mouth daily. 30 tablet 11   No current facility-administered medications for this visit.    Allergies:   Amiloride;  Anesthetics, amide; Other; Asa [aspirin]; Diltiazem hcl; Eplerenone; Indomethacin; Naproxen; Norvasc [amlodipine besylate]; Tenex [guanfacine hcl]; Triamterene-hctz; Potassium chloride; Hytrin [terazosin hcl]; and Lisinopril    Social History:  The patient  reports that she has never smoked. She has never used smokeless tobacco. She reports current alcohol use. She reports that she does not use drugs.   Family History:  The patient's family history includes Hypertension in her mother; Stroke in her brother and mother.    ROS:  Please see the history of present illness.   Otherwise, review of systems are positive for none.   All other systems are reviewed and negative.    PHYSICAL EXAM: VS:  BP (!) 148/90   Pulse 76   Ht 5' 5.75" (1.67 m)   Wt 144 lb 6.4 oz (65.5 kg)   SpO2 97%   BMI 23.48 kg/m  , BMI Body mass index is 23.48 kg/m. GEN: Well nourished, well developed, in no acute distress  HEENT: normal  Neck: no JVD, carotid bruits, or masses Cardiac: RRR; no murmurs, rubs, or gallops,no edema  Respiratory:  clear to auscultation bilaterally, normal work of breathing GI: soft, nontender, nondistended, + BS MS: no deformity or atrophy  Skin: warm and dry, no rash Neuro:  Strength and sensation are intact Psych: euthymic mood, full affect   EKG:  EKG is not ordered today. The ekg ordered today demonstrates N/A   Recent Labs: 12/21/2019: TSH 2.747 03/15/2020: ALT 13 03/16/2020: Magnesium 2.0 04/18/2020: BUN 7; Creatinine, Ser 0.88; Hemoglobin 10.4; Platelets 182; Potassium 3.9; Sodium 140    Lipid Panel    Component Value Date/Time   CHOL 195 03/16/2020 0517   TRIG 57 03/16/2020 0517   HDL 61 03/16/2020 0517   CHOLHDL 3.2 03/16/2020 0517   VLDL 11 03/16/2020 0517   LDLCALC 123 (H) 03/16/2020 0517   LDLDIRECT 158.7 09/30/2013 1255      Wt Readings from Last 3 Encounters:  05/01/20 144 lb 6.4 oz (65.5 kg)  03/27/20 146 lb 6.4 oz (66.4 kg)  03/25/20 153 lb 10.6 oz (69.7  kg)      Other studies Reviewed: Additional studies/ records that were reviewed today include:   Echo 03/16/20: IMPRESSIONS    1. Left ventricular ejection fraction, by estimation, is 55 to 60%. The  left ventricle has normal function. The left ventricle has no regional  wall motion abnormalities. There is mild left ventricular hypertrophy.  Left ventricular diastolic parameters  are consistent with Grade I diastolic dysfunction (impaired relaxation).  2. Right ventricular systolic function is normal. The right ventricular  size is normal. There is normal pulmonary artery systolic pressure. The  estimated right ventricular systolic pressure is 55.3 mmHg.  3. Left atrial size was mildly dilated.  4. The mitral valve is normal in structure.  No evidence of mitral valve  regurgitation. No evidence of mitral stenosis.  5. The aortic valve is tricuspid. Aortic valve regurgitation is not  visualized. No aortic stenosis is present.  6. The inferior vena cava is normal in size with greater than 50%  respiratory variability, suggesting right atrial pressure of 3 mmHg.  Cardiac cath 03/16/20:  CORONARY STENT INTERVENTION  LEFT HEART CATH AND CORONARY ANGIOGRAPHY  Conclusion    Prox RCA lesion is 20% stenosed.  Mid Cx to Dist Cx lesion is 20% stenosed.  Prox LAD lesion is 95% stenosed.  A drug-eluting stent was successfully placed using a SYNERGY XD 3.50X12.  Post intervention, there is a 0% residual stenosis.   1. Severe stenosis proximal LAD 2. Successful PTCA/DES x 1 proximal LAD 3. Mild non-obstructive disease in the Circumflex and RCA  Recommendations: DAPT with ASA and Plavix for one year. Continue statin and beta blocker. Likely d/c home tomorrow.      ASSESSMENT AND PLAN:  1.  CAD s/p NSTEMI in July. Had stenting of high grade proximal LAD stenosis with DES. Needs DAPT for one year. On high dose statin. Need to optimize BP control. Cardiac Rehab.  2. 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 She is currently on spironolactone 25 mg daily and tolerating it well. Potassium levels have improved. I still feel that her current dose is not adequate to treat her primary hyperaldosteronism. I would favor increasing her spironolactone to 50 mg daily and eventually to 100 mg daily. Given her hair loss we also discussed switching to eplerenone. I am fine with this as well but would still need to be higher dose. She mentions Dr Elenore Paddy has suggested referral to endocrinology and I am all for this.  3. HLD now on high dose statin. Labs followed by primary care. Goal LDL < 70.    Current medicines are reviewed at length with the patient today.  The patient does not have concerns regarding medicines.  The following changes have been made:  no change- patient unwilling to change until sees Endocrinology  Labs/ tests ordered today include:  No orders of the defined types were placed in this encounter.    Disposition:   FU with me in 3 months  Signed, Orpha Dain Martinique, MD  05/01/2020 5:27 PM    [facility] [address] [place], [place], [address] Phone [phone], Fax [phone]

## 2020-05-01 ENCOUNTER — Ambulatory Visit: Payer: PPO | Admitting: Cardiology

## 2020-05-01 ENCOUNTER — Encounter: Payer: Self-pay | Admitting: Cardiology

## 2020-05-01 ENCOUNTER — Other Ambulatory Visit: Payer: Self-pay

## 2020-05-01 VITALS — BP 148/90 | HR 76 | Ht 65.75 in | Wt 144.4 lb

## 2020-05-01 DIAGNOSIS — I2510 Atherosclerotic heart disease of native coronary artery without angina pectoris: Secondary | ICD-10-CM

## 2020-05-01 DIAGNOSIS — E7800 Pure hypercholesterolemia, unspecified: Secondary | ICD-10-CM

## 2020-05-01 DIAGNOSIS — E269 Hyperaldosteronism, unspecified: Secondary | ICD-10-CM | POA: Diagnosis not present

## 2020-05-01 DIAGNOSIS — I152 Hypertension secondary to endocrine disorders: Secondary | ICD-10-CM

## 2020-05-02 ENCOUNTER — Encounter (HOSPITAL_COMMUNITY)
Admission: RE | Admit: 2020-05-02 | Discharge: 2020-05-02 | Disposition: A | Discharge status: Home / Self Care | Payer: PPO | Source: Ambulatory Visit | Attending: Cardiology | Admitting: Cardiology

## 2020-05-02 ENCOUNTER — Encounter (HOSPITAL_COMMUNITY): Payer: Self-pay

## 2020-05-02 VITALS — BP 156/92 | HR 80 | Resp 18 | Ht 65.0 in | Wt 139.6 lb

## 2020-05-02 DIAGNOSIS — I214 Non-ST elevation (NSTEMI) myocardial infarction: Secondary | ICD-10-CM

## 2020-05-02 DIAGNOSIS — Z955 Presence of coronary angioplasty implant and graft: Secondary | ICD-10-CM

## 2020-05-02 NOTE — Progress Notes (Signed)
Cardiac Individual Treatment Plan  Patient Details  Name: Anne [name] MRN: [id number] Date of Birth: 10-23-1945 Referring Provider:     CARDIAC REHAB PHASE II ORIENTATION from 05/02/2020 in [place]  Referring Provider Martinique, Peter M, MD      Initial Encounter Date:    CARDIAC REHAB PHASE II ORIENTATION from 05/02/2020 in [place]  Date 05/02/20      Visit Diagnosis: NSTEMI (non-ST elevated myocardial infarction) [facility])  Status post coronary artery stent placement  Patient's Home Medications on Admission:  Current Outpatient Medications:  .  aspirin EC 81 MG EC tablet, Take 1 tablet (81 mg total) by mouth daily. Swallow whole., Disp: 30 tablet, Rfl: 11 .  atorvastatin (LIPITOR) 80 MG tablet, Take 1 tablet (80 mg total) by mouth daily., Disp: 30 tablet, Rfl: 3 .  B Complex-C (B-COMPLEX WITH VITAMIN C) tablet, Take 1 tablet by mouth daily., Disp: , Rfl:  .  carvedilol (COREG) 3.125 MG tablet, Take 1 tablet (3.125 mg total) by mouth 2 (two) times daily with a meal., Disp: 60 tablet, Rfl: 2 .  clopidogrel (PLAVIX) 75 MG tablet, Take 1 tablet (75 mg total) by mouth daily with breakfast., Disp: 30 tablet, Rfl: 3 .  GARLIC PO, Take 2 tablets by mouth daily. , Disp: , Rfl:  .  Magnesium 200 MG TABS, Take 200 tablets by mouth., Disp: , Rfl:  .  Multiple Vitamins-Minerals (MULTIVITAMIN WITH MINERALS) tablet, Take 1 tablet by mouth daily., Disp: , Rfl:  .  spironolactone (ALDACTONE) 25 MG tablet, Take 1 tablet (25 mg total) by mouth daily., Disp: 30 tablet, Rfl: 11 .  nitroGLYCERIN (NITROSTAT) 0.4 MG SL tablet, Place 1 tablet (0.4 mg total) under the tongue every 5 (five) minutes as needed for chest pain. May repeat 2 times, if 3rd dose needed call 911 (Patient not taking: Reported on 05/02/2020), Disp: 25 tablet, Rfl: 3  Past Medical History: Past Medical History:  Diagnosis Date  . Complication of anesthesia    slow to  wake up  . Diverticulosis   . Hiatal hernia   . Hypercholesterolemia   . Hypertension   . Osteopenia   . Vitamin D deficiency     Tobacco Use: Social History   Tobacco Use  Smoking Status Never Smoker  Smokeless Tobacco Never Used    Labs: Recent Review Flowsheet Data    Labs for ITP Cardiac and Pulmonary Rehab Latest Ref Rng & Units 09/30/2013 06/10/2014 12/22/2019 03/06/2020 03/16/2020   Cholestrol 0 - 200 mg/dL 225(H) 234(H) - - 195   LDLCALC 0 - 99 mg/dL - 168(H) - - 123(H)   LDLDIRECT mg/dL 158.7 - - - -   HDL >40 mg/dL 57.20 56.00 - - 61   Trlycerides <150 mg/dL 69.0 50.0 - - 57   Hemoglobin A1c 4.8 - 5.6 % - - 5.8(H) - -   TCO2 22 - 32 mmol/L - - - 27 -      Capillary Blood Glucose: Lab Results  Component Value Date   GLUCAP 132 (H) 03/16/2020   GLUCAP 89 03/06/2020   GLUCAP 110 (H) 12/20/2019   GLUCAP 98 04/06/2014     Exercise Target Goals: Exercise Program Goal: Individual exercise prescription set using results from initial 6 min walk test and THRR while considering  patient's activity barriers and safety.   Exercise Prescription Goal: Initial exercise prescription builds to 30-45 minutes a day of aerobic activity, 2-3 days per week.  Home exercise guidelines will be given to patient during program as part of exercise prescription that the participant will acknowledge.  Activity Barriers & Risk Stratification:  Activity Barriers & Cardiac Risk Stratification - 05/02/20 0829      Activity Barriers & Cardiac Risk Stratification   Activity Barriers Assistive Device;Deconditioning;Balance Concerns    Cardiac Risk Stratification High           6 Minute Walk:  6 Minute Walk    Row Name 05/02/20 1148         6 Minute Walk   Phase Initial     Distance 583 feet     Walk Time 6 minutes     # of Rest Breaks 0     MPH 1.1     METS 1.71     RPE 7     Perceived Dyspnea  0     VO2 Peak 6     Symptoms No     Resting HR 86 bpm     Resting BP 156/92      Resting Oxygen Saturation  99 %     Exercise Oxygen Saturation  during 6 min walk 99 %     Max Ex. HR 82 bpm     Max Ex. BP 156/86     2 Minute Post BP 158/88            Oxygen Initial Assessment:   Oxygen Re-Evaluation:   Oxygen Discharge (Final Oxygen Re-Evaluation):   Initial Exercise Prescription:  Initial Exercise Prescription - 05/02/20 1100      Date of Initial Exercise RX and Referring Provider   Date 05/02/20    Referring Provider Martinique, Peter M, MD    Expected Discharge Date 06/30/20      Recumbant Bike   Level 1    Minutes 15    METs 1.5      NuStep   Level 1    SPM 85    Minutes 15    METs 1.5      Prescription Details   Frequency (times per week) 3    Duration Progress to 30 minutes of continuous aerobic without signs/symptoms of physical distress      Intensity   THRR 40-80% of Max Heartrate 59-118    Ratings of Perceived Exertion 11-13    Perceived Dyspnea 0-4      Progression   Progression Continue to progress workloads to maintain intensity without signs/symptoms of physical distress.      Resistance Training   Training Prescription Yes    Weight 2lbs    Reps 10-15           Perform Capillary Blood Glucose checks as needed.  Exercise Prescription Changes:   Exercise Comments:   Exercise Goals and Review:   Exercise Goals    Row Name 05/02/20 0829             Exercise Goals   Increase Physical Activity Yes       Intervention Provide advice, education, support and counseling about physical activity/exercise needs.;Develop an individualized exercise prescription for aerobic and resistive training based on initial evaluation findings, risk stratification, comorbidities and participant's personal goals.       Expected Outcomes Short Term: Attend rehab on a regular basis to increase amount of physical activity.;Long Term: Exercising regularly at least 3-5 days a week.;Long Term: Add in home exercise to make exercise part of  routine and to increase amount of physical activity.  Increase Strength and Stamina Yes       Intervention Provide advice, education, support and counseling about physical activity/exercise needs.;Develop an individualized exercise prescription for aerobic and resistive training based on initial evaluation findings, risk stratification, comorbidities and participant's personal goals.       Expected Outcomes Short Term: Increase workloads from initial exercise prescription for resistance, speed, and METs.;Short Term: Perform resistance training exercises routinely during rehab and add in resistance training at home;Long Term: Improve cardiorespiratory fitness, muscular endurance and strength as measured by increased METs and functional capacity (6MWT)       Able to understand and use rate of perceived exertion (RPE) scale Yes       Intervention Provide education and explanation on how to use RPE scale       Expected Outcomes Short Term: Able to use RPE daily in rehab to express subjective intensity level;Long Term:  Able to use RPE to guide intensity level when exercising independently       Knowledge and understanding of Target Heart Rate Range (THRR) Yes       Intervention Provide education and explanation of THRR including how the numbers were predicted and where they are located for reference       Expected Outcomes Short Term: Able to state/look up THRR;Long Term: Able to use THRR to govern intensity when exercising independently;Short Term: Able to use daily as guideline for intensity in rehab       Able to check pulse independently Yes       Intervention Provide education and demonstration on how to check pulse in carotid and radial arteries.;Review the importance of being able to check your own pulse for safety during independent exercise       Expected Outcomes Short Term: Able to explain why pulse checking is important during independent exercise;Long Term: Able to check pulse independently  and accurately       Understanding of Exercise Prescription Yes       Intervention Provide education, explanation, and written materials on patient's individual exercise prescription       Expected Outcomes Short Term: Able to explain program exercise prescription;Long Term: Able to explain home exercise prescription to exercise independently              Exercise Goals Re-Evaluation :   Discharge Exercise Prescription (Final Exercise Prescription Changes):   Nutrition:  Target Goals: Understanding of nutrition guidelines, daily intake of sodium 1500mg , cholesterol 200mg , calories 30% from fat and 7% or less from saturated fats, daily to have 5 or more servings of fruits and vegetables.  Biometrics:  Pre Biometrics - 05/02/20 0800      Pre Biometrics   Waist Circumference 29 inches    Hip Circumference 38.75 inches    Waist to Hip Ratio 0.75 %    Triceps Skinfold 16 mm    % Body Fat 31.7 %    Grip Strength 35.5 kg    Flexibility 16 in    Single Leg Stand 1.38 seconds            Nutrition Therapy Plan and Nutrition Goals:   Nutrition Assessments:   Nutrition Goals Re-Evaluation:   Nutrition Goals Re-Evaluation:   Nutrition Goals Discharge (Final Nutrition Goals Re-Evaluation):   Psychosocial: Target Goals: Acknowledge presence or absence of significant depression and/or stress, maximize coping skills, provide positive support system. Participant is able to verbalize types and ability to use techniques and skills needed for reducing stress and depression.  Initial Review &  Psychosocial Screening:  Initial Psych Review & Screening - 05/02/20 0837      Initial Review   Current issues with None Identified      Family Dynamics   Good Support System? Yes    Comments Ms. Knoche presents to her cardiac rehab orientation appointment with a positive attitude and outlook. She endorses a very strong support system including her medical providers, church family,  friends, and family (children, grandchildren, husband). She is a reverand and strongly relies on her faith, prayer, and bible reading as a mode of stress reduction and peace. No psychosocial barriers to self health management or participation in cardiac rehab identified. No interventions needed at this time. Will continue to monitor.      Barriers   Psychosocial barriers to participate in program There are no identifiable barriers or psychosocial needs.      Screening Interventions   Interventions Encouraged to exercise           Quality of Life Scores:  Quality of Life - 05/02/20 1150      Quality of Life   Select Quality of Life      Quality of Life Scores   Health/Function Pre 23.14 %    Socioeconomic Pre 29 %    Psych/Spiritual Pre 24.86 %    Family Pre 27.6 %    GLOBAL Pre 25.31 %          Scores of 19 and below usually indicate a poorer quality of life in these areas.  A difference of  2-3 points is a clinically meaningful difference.  A difference of 2-3 points in the total score of the Quality of Life Index has been associated with significant improvement in overall quality of life, self-image, physical symptoms, and general health in studies assessing change in quality of life.  PHQ-9: Recent Review Flowsheet Data    Depression screen [facility] 2/9 05/02/2020   Decreased Interest 0   Down, Depressed, Hopeless 0   PHQ - 2 Score 0     Interpretation of Total Score  Total Score Depression Severity:  1-4 = Minimal depression, 5-9 = Mild depression, 10-14 = Moderate depression, 15-19 = Moderately severe depression, 20-27 = Severe depression   Psychosocial Evaluation and Intervention:   Psychosocial Re-Evaluation:   Psychosocial Discharge (Final Psychosocial Re-Evaluation):   Vocational Rehabilitation: Provide vocational rehab assistance to qualifying candidates.   Vocational Rehab Evaluation & Intervention:  Vocational Rehab - 05/02/20 0841      Initial  Vocational Rehab Evaluation & Intervention   Assessment shows need for Vocational Rehabilitation No           Education: Education Goals: Education classes will be provided on a weekly basis, covering required topics. Participant will state understanding/return demonstration of topics presented.  Learning Barriers/Preferences:   Education Topics: Count Your Pulse:  -Group instruction provided by verbal instruction, demonstration, patient participation and written materials to support subject.  Instructors address importance of being able to find your pulse and how to count your pulse when at home without a heart monitor.  Patients get hands on experience counting their pulse with staff help and individually.   Heart Attack, Angina, and Risk Factor Modification:  -Group instruction provided by verbal instruction, video, and written materials to support subject.  Instructors address signs and symptoms of angina and heart attacks.    Also discuss risk factors for heart disease and how to make changes to improve heart health risk factors.   Functional Fitness:  -Group instruction  provided by verbal instruction, demonstration, patient participation, and written materials to support subject.  Instructors address safety measures for doing things around the house.  Discuss how to get up and down off the floor, how to pick things up properly, how to safely get out of a chair without assistance, and balance training.   Meditation and Mindfulness:  -Group instruction provided by verbal instruction, patient participation, and written materials to support subject.  Instructor addresses importance of mindfulness and meditation practice to help reduce stress and improve awareness.  Instructor also leads participants through a meditation exercise.    Stretching for Flexibility and Mobility:  -Group instruction provided by verbal instruction, patient participation, and written materials to support  subject.  Instructors lead participants through series of stretches that are designed to increase flexibility thus improving mobility.  These stretches are additional exercise for major muscle groups that are typically performed during regular warm up and cool down.   Hands Only CPR:  -Group verbal, video, and participation provides a basic overview of AHA guidelines for community CPR. Role-play of emergencies allow participants the opportunity to practice calling for help and chest compression technique with discussion of AED use.   Hypertension: -Group verbal and written instruction that provides a basic overview of hypertension including the most recent diagnostic guidelines, risk factor reduction with self-care instructions and medication management.    Nutrition I class: Heart Healthy Eating:  -Group instruction provided by PowerPoint slides, verbal discussion, and written materials to support subject matter. The instructor gives an explanation and review of the Therapeutic Lifestyle Changes diet recommendations, which includes a discussion on lipid goals, dietary fat, sodium, fiber, plant stanol/sterol esters, sugar, and the components of a well-balanced, healthy diet.   Nutrition II class: Lifestyle Skills:  -Group instruction provided by PowerPoint slides, verbal discussion, and written materials to support subject matter. The instructor gives an explanation and review of label reading, grocery shopping for heart health, heart healthy recipe modifications, and ways to make healthier choices when eating out.   Diabetes Question & Answer:  -Group instruction provided by PowerPoint slides, verbal discussion, and written materials to support subject matter. The instructor gives an explanation and review of diabetes co-morbidities, pre- and post-prandial blood glucose goals, pre-exercise blood glucose goals, signs, symptoms, and treatment of hypoglycemia and hyperglycemia, and foot care  basics.   Diabetes Blitz:  -Group instruction provided by PowerPoint slides, verbal discussion, and written materials to support subject matter. The instructor gives an explanation and review of the physiology behind type 1 and type 2 diabetes, diabetes medications and rational behind using different medications, pre- and post-prandial blood glucose recommendations and Hemoglobin A1c goals, diabetes diet, and exercise including blood glucose guidelines for exercising safely.    Portion Distortion:  -Group instruction provided by PowerPoint slides, verbal discussion, written materials, and food models to support subject matter. The instructor gives an explanation of serving size versus portion size, changes in portions sizes over the last 20 years, and what consists of a serving from each food group.   Stress Management:  -Group instruction provided by verbal instruction, video, and written materials to support subject matter.  Instructors review role of stress in heart disease and how to cope with stress positively.     Exercising on Your Own:  -Group instruction provided by verbal instruction, power point, and written materials to support subject.  Instructors discuss benefits of exercise, components of exercise, frequency and intensity of exercise, and end points for exercise.  Also discuss  use of nitroglycerin and activating EMS.  Review options of places to exercise outside of rehab.  Review guidelines for sex with heart disease.   Cardiac Drugs I:  -Group instruction provided by verbal instruction and written materials to support subject.  Instructor reviews cardiac drug classes: antiplatelets, anticoagulants, beta blockers, and statins.  Instructor discusses reasons, side effects, and lifestyle considerations for each drug class.   Cardiac Drugs II:  -Group instruction provided by verbal instruction and written materials to support subject.  Instructor reviews cardiac drug classes:  angiotensin converting enzyme inhibitors (ACE-I), angiotensin II receptor blockers (ARBs), nitrates, and calcium channel blockers.  Instructor discusses reasons, side effects, and lifestyle considerations for each drug class.   Anatomy and Physiology of the Circulatory System:  Group verbal and written instruction and models provide basic cardiac anatomy and physiology, with the coronary electrical and arterial systems. Review of: AMI, Angina, Valve disease, Heart Failure, Peripheral Artery Disease, Cardiac Arrhythmia, Pacemakers, and the ICD.   Other Education:  -Group or individual verbal, written, or video instructions that support the educational goals of the cardiac rehab program.   Holiday Eating Survival Tips:  -Group instruction provided by PowerPoint slides, verbal discussion, and written materials to support subject matter. The instructor gives patients tips, tricks, and techniques to help them not only survive but enjoy the holidays despite the onslaught of food that accompanies the holidays.   Knowledge Questionnaire Score:  Knowledge Questionnaire Score - 05/02/20 1151      Knowledge Questionnaire Score   Pre Score 23/24           Core Components/Risk Factors/Patient Goals at Admission:  Personal Goals and Risk Factors at Admission - 05/02/20 1151      Core Components/Risk Factors/Patient Goals on Admission   Hypertension Yes    Intervention Provide education on lifestyle modifcations including regular physical activity/exercise, weight management, moderate sodium restriction and increased consumption of fresh fruit, vegetables, and low fat dairy, alcohol moderation, and smoking cessation.;Monitor prescription use compliance.    Expected Outcomes Short Term: Continued assessment and intervention until BP is < 140/35mm HG in hypertensive participants. < 130/27mm HG in hypertensive participants with diabetes, heart failure or chronic kidney disease.;Long Term: Maintenance of  blood pressure at goal levels.    Lipids Yes    Intervention Provide education and support for participant on nutrition & aerobic/resistive exercise along with prescribed medications to achieve LDL 70mg , HDL >40mg .    Expected Outcomes Short Term: Participant states understanding of desired cholesterol values and is compliant with medications prescribed. Participant is following exercise prescription and nutrition guidelines.;Long Term: Cholesterol controlled with medications as prescribed, with individualized exercise RX and with personalized nutrition plan. Value goals: LDL < 70mg , HDL > 40 mg.           Core Components/Risk Factors/Patient Goals Review:    Core Components/Risk Factors/Patient Goals at Discharge (Final Review):    ITP Comments:  ITP Comments    Row Name 05/02/20 0754           ITP Comments Dr. Fransico Him, Medical Director Cardiac Re[facility]              Comments: Patient attended orientation on 05/02/2020 to review rules and guidelines for program.  Completed 6 minute walk test, Intitial ITP, and exercise prescription.  VSS. Telemetry-NSR.  Asymptomatic. Safety measures and social distancing in place per CDC guidelines. Of note, patient wishes to not have her medical information including vital signs or RPE spoken aloud  during group exercise. She has provided a written and signed statement of such.

## 2020-05-03 ENCOUNTER — Ambulatory Visit: Payer: PPO | Admitting: Cardiology

## 2020-05-08 ENCOUNTER — Other Ambulatory Visit: Payer: Self-pay

## 2020-05-08 ENCOUNTER — Encounter (HOSPITAL_COMMUNITY)
Admission: RE | Admit: 2020-05-08 | Discharge: 2020-05-08 | Disposition: A | Discharge status: Home / Self Care | Payer: PPO | Source: Ambulatory Visit | Attending: Cardiology | Admitting: Cardiology

## 2020-05-08 DIAGNOSIS — Z955 Presence of coronary angioplasty implant and graft: Secondary | ICD-10-CM

## 2020-05-08 DIAGNOSIS — I214 Non-ST elevation (NSTEMI) myocardial infarction: Secondary | ICD-10-CM | POA: Diagnosis not present

## 2020-05-08 NOTE — Progress Notes (Signed)
Daily Session Note  Patient Details  Name: Anne [name] MRN: [id number] Date of Birth: 06-03-1946 Referring Provider:     [place] from 05/02/2020 in [place]  Referring Provider [name], Anne M, MD      Encounter Date: 05/08/2020  Check In:  Session Check In - 05/08/20 0915      Check-In   Supervising physician immediately available to respond to emergencies [facility] immediately available    Physician(s) Dr. Maylene Roes    Location [facility]    Staff Present Trish Fountain, RN, Deland Pretty, MS, ACSM CEP, Exercise Physiologist;David Lilyan Punt, MS, EP-C, CCRP;Anicia Leuthold, RN, Mosie Epstein, MS,ACSM CEP, Exercise Physiologist    Virtual Visit No    Medication changes reported     No    Fall or balance concerns reported    Yes    Comments walks with a walker    Tobacco Cessation No Change    Warm-up and Cool-down Performed on first and last piece of equipment    Resistance Training Performed Yes    VAD Patient? No    PAD/SET Patient? No      Pain Assessment   Currently in Pain? No/denies    Multiple Pain Sites No           Capillary Blood Glucose: No results found for this or any previous visit (from the past 24 hour(s)).   Exercise Prescription Changes - 05/08/20 1000      Response to Exercise   Blood Pressure (Admit) 130/70    Blood Pressure (Exercise) 124/64    Blood Pressure (Exit) 140/80    Heart Rate (Admit) 87 bpm    Heart Rate (Exercise) 91 bpm    Heart Rate (Exit) 81 bpm    Rating of Perceived Exertion (Exercise) 12    Perceived Dyspnea (Exercise) 0    Symptoms None     Comments Pt's first day of exercise    Duration Progress to 10 minutes continuous walking  at current work load and total walking time to 30-45 min    Intensity THRR unchanged      Progression   Progression Continue to progress workloads to maintain intensity without signs/symptoms of physical  distress.    Average METs 1.9      Resistance Training   Training Prescription Yes    Weight 2lbs    Reps 10-15    Time 10 Minutes      Interval Training   Interval Training No      Recumbant Bike   Level 1    Minutes 15    METs 2      NuStep   Level 1    SPM 90    Minutes 15    METs 1.9           Social History   Tobacco Use  Smoking Status Never Smoker  Smokeless Tobacco Never Used    Goals Met:  No report of cardiac concerns or symptoms Strength training completed today  Goals Unmet:  Not Applicable  Comments: Anne [name] started cardiac rehab today.  Pt tolerated light exercise without difficulty. VSS, telemetry-Sinus Rhythm, asymptomatic.Anne [name] uses a rollator when walking.  Medication list reconciled. Pt denies barriers to medicaiton compliance.  PSYCHOSOCIAL ASSESSMENT:  PHQ-0. Pt exhibits positive coping skills, hopeful outlook with supportive family. No psychosocial needs identified at this time, no psychosocial interventions necessary.    Pt enjoys reading, playing the piano and keyboard, decorating  and sewing.   Pt oriented to exercise equipment and routine.    Understanding verbalized.Anne Pall, RN,BSN 05/08/2020 2:18 PM   Dr. Fransico Him is Medical Director for Cardiac Rehab at [facility].

## 2020-05-09 DIAGNOSIS — I10 Essential (primary) hypertension: Secondary | ICD-10-CM | POA: Diagnosis not present

## 2020-05-09 DIAGNOSIS — R002 Palpitations: Secondary | ICD-10-CM | POA: Diagnosis not present

## 2020-05-09 DIAGNOSIS — Z1211 Encounter for screening for malignant neoplasm of colon: Secondary | ICD-10-CM | POA: Diagnosis not present

## 2020-05-09 DIAGNOSIS — M8580 Other specified disorders of bone density and structure, unspecified site: Secondary | ICD-10-CM | POA: Diagnosis not present

## 2020-05-09 DIAGNOSIS — I252 Old myocardial infarction: Secondary | ICD-10-CM | POA: Diagnosis not present

## 2020-05-09 DIAGNOSIS — Z0000 Encounter for general adult medical examination without abnormal findings: Secondary | ICD-10-CM | POA: Diagnosis not present

## 2020-05-09 DIAGNOSIS — E7800 Pure hypercholesterolemia, unspecified: Secondary | ICD-10-CM | POA: Diagnosis not present

## 2020-05-10 ENCOUNTER — Other Ambulatory Visit: Payer: Self-pay

## 2020-05-10 ENCOUNTER — Encounter (HOSPITAL_COMMUNITY)
Admission: RE | Admit: 2020-05-10 | Discharge: 2020-05-10 | Disposition: A | Discharge status: Home / Self Care | Payer: PPO | Source: Ambulatory Visit | Attending: Cardiology | Admitting: Cardiology

## 2020-05-10 DIAGNOSIS — I214 Non-ST elevation (NSTEMI) myocardial infarction: Secondary | ICD-10-CM | POA: Diagnosis not present

## 2020-05-10 DIAGNOSIS — Z955 Presence of coronary angioplasty implant and graft: Secondary | ICD-10-CM | POA: Diagnosis not present

## 2020-05-12 ENCOUNTER — Other Ambulatory Visit: Payer: Self-pay

## 2020-05-12 ENCOUNTER — Encounter (HOSPITAL_COMMUNITY)
Admission: RE | Admit: 2020-05-12 | Discharge: 2020-05-12 | Disposition: A | Discharge status: Home / Self Care | Payer: PPO | Source: Ambulatory Visit | Attending: Cardiology | Admitting: Cardiology

## 2020-05-12 VITALS — Wt 139.6 lb

## 2020-05-12 DIAGNOSIS — I214 Non-ST elevation (NSTEMI) myocardial infarction: Secondary | ICD-10-CM

## 2020-05-12 DIAGNOSIS — Z955 Presence of coronary angioplasty implant and graft: Secondary | ICD-10-CM

## 2020-05-17 ENCOUNTER — Encounter (HOSPITAL_COMMUNITY): Payer: PPO

## 2020-05-17 DIAGNOSIS — E876 Hypokalemia: Secondary | ICD-10-CM | POA: Diagnosis not present

## 2020-05-17 DIAGNOSIS — E2689 Other hyperaldosteronism: Secondary | ICD-10-CM | POA: Diagnosis not present

## 2020-05-17 DIAGNOSIS — Z823 Family history of stroke: Secondary | ICD-10-CM | POA: Diagnosis not present

## 2020-05-17 DIAGNOSIS — I10 Essential (primary) hypertension: Secondary | ICD-10-CM | POA: Diagnosis not present

## 2020-05-17 DIAGNOSIS — I252 Old myocardial infarction: Secondary | ICD-10-CM | POA: Diagnosis not present

## 2020-05-17 NOTE — Progress Notes (Signed)
Cardiac Individual Treatment Plan  Patient Details  Name: Anne [name] MRN: [id number] Date of Birth: [birth year] Referring Provider:     CARDIAC REHAB PHASE II ORIENTATION from 05/02/2020 in [place]  Referring Provider Martinique, Peter M, MD      Initial Encounter Date:    CARDIAC REHAB PHASE II ORIENTATION from 05/02/2020 in [facility]  Date 05/02/20      Visit Diagnosis: NSTEMI (non-ST elevated myocardial infarction) [facility])  Status post coronary artery stent placement  Patient's Home Medications on Admission:  Current Outpatient Medications:  .  aspirin EC 81 MG EC tablet, Take 1 tablet (81 mg total) by mouth daily. Swallow whole., Disp: 30 tablet, Rfl: 11 .  atorvastatin (LIPITOR) 80 MG tablet, Take 1 tablet (80 mg total) by mouth daily., Disp: 30 tablet, Rfl: 3 .  B Complex-C (B-COMPLEX WITH VITAMIN C) tablet, Take 1 tablet by mouth daily., Disp: , Rfl:  .  carvedilol (COREG) 3.125 MG tablet, Take 1 tablet (3.125 mg total) by mouth 2 (two) times daily with a meal., Disp: 60 tablet, Rfl: 2 .  clopidogrel (PLAVIX) 75 MG tablet, Take 1 tablet (75 mg total) by mouth daily with breakfast., Disp: 30 tablet, Rfl: 3 .  GARLIC PO, Take 2 tablets by mouth daily. , Disp: , Rfl:  .  Magnesium 200 MG TABS, Take 200 tablets by mouth., Disp: , Rfl:  .  Multiple Vitamins-Minerals (MULTIVITAMIN WITH MINERALS) tablet, Take 1 tablet by mouth daily., Disp: , Rfl:  .  nitroGLYCERIN (NITROSTAT) 0.4 MG SL tablet, Place 1 tablet (0.4 mg total) under the tongue every 5 (five) minutes as needed for chest pain. May repeat 2 times, if 3rd dose needed call 911 (Patient not taking: Reported on 05/02/2020), Disp: 25 tablet, Rfl: 3 .  spironolactone (ALDACTONE) 25 MG tablet, Take 1 tablet (25 mg total) by mouth daily., Disp: 30 tablet, Rfl: 11  Past Medical History: Past Medical History:  Diagnosis Date  . Complication of anesthesia    slow to  wake up  . Diverticulosis   . Hiatal hernia   . Hypercholesterolemia   . Hypertension   . Osteopenia   . Vitamin D deficiency     Tobacco Use: Social History   Tobacco Use  Smoking Status Never Smoker  Smokeless Tobacco Never Used    Labs: Recent Review Flowsheet Data    Labs for ITP Cardiac and Pulmonary Rehab Latest Ref Rng & Units 09/30/2013 06/10/2014 12/22/2019 03/06/2020 03/16/2020   Cholestrol 0 - 200 mg/dL 225(H) 234(H) - - 195   LDLCALC 0 - 99 mg/dL - 168(H) - - 123(H)   LDLDIRECT mg/dL 158.7 - - - -   HDL >40 mg/dL 57.20 56.00 - - 61   Trlycerides <150 mg/dL 69.0 50.0 - - 57   Hemoglobin A1c 4.8 - 5.6 % - - 5.8(H) - -   TCO2 22 - 32 mmol/L - - - 27 -      Capillary Blood Glucose: Lab Results  Component Value Date   GLUCAP 132 (H) 03/16/2020   GLUCAP 89 03/06/2020   GLUCAP 110 (H) 12/20/2019   GLUCAP 98 04/06/2014     Exercise Target Goals: Exercise Program Goal: Individual exercise prescription set using results from initial 6 min walk test and THRR while considering  patient's activity barriers and safety.   Exercise Prescription Goal: Starting with aerobic activity 30 plus minutes a day, 3 days per week for initial exercise  prescription. Provide home exercise prescription and guidelines that participant acknowledges understanding prior to discharge.  Activity Barriers & Risk Stratification:  Activity Barriers & Cardiac Risk Stratification - 05/02/20 0829      Activity Barriers & Cardiac Risk Stratification   Activity Barriers Assistive Device;Deconditioning;Balance Concerns    Cardiac Risk Stratification High           6 Minute Walk:  6 Minute Walk    Row Name 05/02/20 1148         6 Minute Walk   Phase Initial     Distance 583 feet     Walk Time 6 minutes     # of Rest Breaks 0     MPH 1.1     METS 1.71     RPE 7     Perceived Dyspnea  0     VO2 Peak 6     Symptoms No     Resting HR 86 bpm     Resting BP 156/92     Resting Oxygen  Saturation  99 %     Exercise Oxygen Saturation  during 6 min walk 99 %     Max Ex. HR 82 bpm     Max Ex. BP 156/86     2 Minute Post BP 158/88            Oxygen Initial Assessment:   Oxygen Re-Evaluation:   Oxygen Discharge (Final Oxygen Re-Evaluation):   Initial Exercise Prescription:  Initial Exercise Prescription - 05/02/20 1100      Date of Initial Exercise RX and Referring Provider   Date 05/02/20    Referring Provider Martinique, Peter M, MD    Expected Discharge Date 06/30/20      Recumbant Bike   Level 1    Minutes 15    METs 1.5      NuStep   Level 1    SPM 85    Minutes 15    METs 1.5      Prescription Details   Frequency (times per week) 3    Duration Progress to 30 minutes of continuous aerobic without signs/symptoms of physical distress      Intensity   THRR 40-80% of Max Heartrate 59-118    Ratings of Perceived Exertion 11-13    Perceived Dyspnea 0-4      Progression   Progression Continue to progress workloads to maintain intensity without signs/symptoms of physical distress.      Resistance Training   Training Prescription Yes    Weight 2lbs    Reps 10-15           Perform Capillary Blood Glucose checks as needed.  Exercise Prescription Changes:   Exercise Prescription Changes    Row Name 05/08/20 1000             Response to Exercise   Blood Pressure (Admit) 130/70       Blood Pressure (Exercise) 124/64       Blood Pressure (Exit) 140/80       Heart Rate (Admit) 87 bpm       Heart Rate (Exercise) 91 bpm       Heart Rate (Exit) 81 bpm       Rating of Perceived Exertion (Exercise) 12       Perceived Dyspnea (Exercise) 0       Symptoms None        Comments Pt's first day of exercise       Duration Progress to 10  minutes continuous walking  at current work load and total walking time to 30-45 min       Intensity THRR unchanged         Progression   Progression Continue to progress workloads to maintain intensity without  signs/symptoms of physical distress.       Average METs 1.9         Resistance Training   Training Prescription Yes       Weight 2lbs       Reps 10-15       Time 10 Minutes         Interval Training   Interval Training No         Recumbant Bike   Level 1       Minutes 15       METs 2         NuStep   Level 1       SPM 90       Minutes 15       METs 1.9              Exercise Comments:   Exercise Comments    Row Name 05/08/20 1009           Exercise Comments Pt's first day of exercise. Pt responded well to exercise prescription. Oriented pt to exercise equipment. Will continue to monitor and progress pt as tolerated.              Exercise Goals and Review:   Exercise Goals    Row Name 05/02/20 0829             Exercise Goals   Increase Physical Activity Yes       Intervention Provide advice, education, support and counseling about physical activity/exercise needs.;Develop an individualized exercise prescription for aerobic and resistive training based on initial evaluation findings, risk stratification, comorbidities and participant's personal goals.       Expected Outcomes Short Term: Attend rehab on a regular basis to increase amount of physical activity.;Long Term: Exercising regularly at least 3-5 days a week.;Long Term: Add in home exercise to make exercise part of routine and to increase amount of physical activity.       Increase Strength and Stamina Yes       Intervention Provide advice, education, support and counseling about physical activity/exercise needs.;Develop an individualized exercise prescription for aerobic and resistive training based on initial evaluation findings, risk stratification, comorbidities and participant's personal goals.       Expected Outcomes Short Term: Increase workloads from initial exercise prescription for resistance, speed, and METs.;Short Term: Perform resistance training exercises routinely during rehab and add in  resistance training at home;Long Term: Improve cardiorespiratory fitness, muscular endurance and strength as measured by increased METs and functional capacity (6MWT)       Able to understand and use rate of perceived exertion (RPE) scale Yes       Intervention Provide education and explanation on how to use RPE scale       Expected Outcomes Short Term: Able to use RPE daily in rehab to express subjective intensity level;Long Term:  Able to use RPE to guide intensity level when exercising independently       Knowledge and understanding of Target Heart Rate Range (THRR) Yes       Intervention Provide education and explanation of THRR including how the numbers were predicted and where they are located for reference       Expected  Outcomes Short Term: Able to state/look up THRR;Long Term: Able to use THRR to govern intensity when exercising independently;Short Term: Able to use daily as guideline for intensity in rehab       Able to check pulse independently Yes       Intervention Provide education and demonstration on how to check pulse in carotid and radial arteries.;Review the importance of being able to check your own pulse for safety during independent exercise       Expected Outcomes Short Term: Able to explain why pulse checking is important during independent exercise;Long Term: Able to check pulse independently and accurately       Understanding of Exercise Prescription Yes       Intervention Provide education, explanation, and written materials on patient's individual exercise prescription       Expected Outcomes Short Term: Able to explain program exercise prescription;Long Term: Able to explain home exercise prescription to exercise independently              Exercise Goals Re-Evaluation :  Exercise Goals Re-Evaluation    Row Name 05/08/20 1011             Exercise Goal Re-Evaluation   Exercise Goals Review Increase Physical Activity;Increase Strength and Stamina;Knowledge and  understanding of Target Heart Rate Range (THRR);Able to understand and use rate of perceived exertion (RPE) scale       Comments Pt's first day of exercise. Pt able to exercise for 30 minutes with minimal difficulty. Will continue to monitor.       Expected Outcomes Pt will continue to increase cardiovascular strength and stamina.               Discharge Exercise Prescription (Final Exercise Prescription Changes):  Exercise Prescription Changes - 05/08/20 1000      Response to Exercise   Blood Pressure (Admit) 130/70    Blood Pressure (Exercise) 124/64    Blood Pressure (Exit) 140/80    Heart Rate (Admit) 87 bpm    Heart Rate (Exercise) 91 bpm    Heart Rate (Exit) 81 bpm    Rating of Perceived Exertion (Exercise) 12    Perceived Dyspnea (Exercise) 0    Symptoms None     Comments Pt's first day of exercise    Duration Progress to 10 minutes continuous walking  at current work load and total walking time to 30-45 min    Intensity THRR unchanged      Progression   Progression Continue to progress workloads to maintain intensity without signs/symptoms of physical distress.    Average METs 1.9      Resistance Training   Training Prescription Yes    Weight 2lbs    Reps 10-15    Time 10 Minutes      Interval Training   Interval Training No      Recumbant Bike   Level 1    Minutes 15    METs 2      NuStep   Level 1    SPM 90    Minutes 15    METs 1.9           Nutrition:  Target Goals: Understanding of nutrition guidelines, daily intake of sodium 1500mg , cholesterol 200mg , calories 30% from fat and 7% or less from saturated fats, daily to have 5 or more servings of fruits and vegetables.  Biometrics:  Pre Biometrics - 05/02/20 0800      Pre Biometrics   Waist Circumference 29 inches  Hip Circumference 38.75 inches    Waist to Hip Ratio 0.75 %    Triceps Skinfold 16 mm    % Body Fat 31.7 %    Grip Strength 35.5 kg    Flexibility 16 in    Single Leg Stand  1.38 seconds            Nutrition Therapy Plan and Nutrition Goals:   Nutrition Assessments:   Nutrition Goals Re-Evaluation:  Nutrition Goals Re-Evaluation    [place] Name 05/16/20 0953             Goals   Current Weight 139 lb 8.8 oz (63.3 kg)              Nutrition Goals Discharge (Final Nutrition Goals Re-Evaluation):  Nutrition Goals Re-Evaluation - 05/16/20 0953      Goals   Current Weight 139 lb 8.8 oz (63.3 kg)           Psychosocial: Target Goals: Acknowledge presence or absence of significant depression and/or stress, maximize coping skills, provide positive support system. Participant is able to verbalize types and ability to use techniques and skills needed for reducing stress and depression.  Initial Review & Psychosocial Screening:  Initial Psych Review & Screening - 05/02/20 0837      Initial Review   Current issues with None Identified      Family Dynamics   Good Support System? Yes    Comments Ms. Venti presents to her cardiac rehab orientation appointment with a positive attitude and outlook. She endorses a very strong support system including her medical providers, church family, friends, and family (children, grandchildren, husband). She is a reverand and strongly relies on her faith, prayer, and bible reading as a mode of stress reduction and peace. No psychosocial barriers to self health management or participation in cardiac rehab identified. No interventions needed at this time. Will continue to monitor.      Barriers   Psychosocial barriers to participate in program There are no identifiable barriers or psychosocial needs.      Screening Interventions   Interventions Encouraged to exercise           Quality of Life Scores:  Quality of Life - 05/02/20 1150      Quality of Life   Select Quality of Life      Quality of Life Scores   Health/Function Pre 23.14 %    Socioeconomic Pre 29 %    Psych/Spiritual Pre 24.86 %    Family  Pre 27.6 %    GLOBAL Pre 25.31 %          Scores of 19 and below usually indicate a poorer quality of life in these areas.  A difference of  2-3 points is a clinically meaningful difference.  A difference of 2-3 points in the total score of the Quality of Life Index has been associated with significant improvement in overall quality of life, self-image, physical symptoms, and general health in studies assessing change in quality of life.  PHQ-9: Recent Review Flowsheet Data    Depression screen [facility] 2/9 05/02/2020   Decreased Interest 0   Down, Depressed, Hopeless 0   PHQ - 2 Score 0     Interpretation of Total Score  Total Score Depression Severity:  1-4 = Minimal depression, 5-9 = Mild depression, 10-14 = Moderate depression, 15-19 = Moderately severe depression, 20-27 = Severe depression   Psychosocial Evaluation and Intervention:   Psychosocial Re-Evaluation:  Psychosocial Re-Evaluation  [place] Name 05/08/20 1425             Psychosocial Re-Evaluation   Current issues with None Identified       Interventions Encouraged to attend Cardiac Rehabilitation for the exercise       Continue Psychosocial Services  No Follow up required              Psychosocial Discharge (Final Psychosocial Re-Evaluation):  Psychosocial Re-Evaluation - 05/08/20 1425      Psychosocial Re-Evaluation   Current issues with None Identified    Interventions Encouraged to attend Cardiac Rehabilitation for the exercise    Continue Psychosocial Services  No Follow up required           Vocational Rehabilitation: Provide vocational rehab assistance to qualifying candidates.   Vocational Rehab Evaluation & Intervention:  Vocational Rehab - 05/02/20 0841      Initial Vocational Rehab Evaluation & Intervention   Assessment shows need for Vocational Rehabilitation No           Education: Education Goals: Education classes will be provided on a weekly basis, covering required topics.  Participant will state understanding/return demonstration of topics presented.  Learning Barriers/Preferences:   Education Topics: Hypertension, Hypertension Reduction -Define heart disease and high blood pressure. Discus how high blood pressure affects the body and ways to reduce high blood pressure.   Exercise and Your Heart -Discuss why it is important to exercise, the FITT principles of exercise, normal and abnormal responses to exercise, and how to exercise safely.   Angina -Discuss definition of angina, causes of angina, treatment of angina, and how to decrease risk of having angina.   Cardiac Medications -Review what the following cardiac medications are used for, how they affect the body, and side effects that may occur when taking the medications.  Medications include Aspirin, Beta blockers, calcium channel blockers, ACE Inhibitors, angiotensin receptor blockers, diuretics, digoxin, and antihyperlipidemics.   Congestive Heart Failure -Discuss the definition of CHF, how to live with CHF, the signs and symptoms of CHF, and how keep track of weight and sodium intake.   Heart Disease and Intimacy -Discus the effect sexual activity has on the heart, how changes occur during intimacy as we age, and safety during sexual activity.   Smoking Cessation / COPD -Discuss different methods to quit smoking, the health benefits of quitting smoking, and the definition of COPD.   Nutrition I: Fats -Discuss the types of cholesterol, what cholesterol does to the heart, and how cholesterol levels can be controlled.   Nutrition II: Labels -Discuss the different components of food labels and how to read food label   Heart Parts/Heart Disease and PAD -Discuss the anatomy of the heart, the pathway of blood circulation through the heart, and these are affected by heart disease.   Stress I: Signs and Symptoms -Discuss the causes of stress, how stress may lead to anxiety and depression,  and ways to limit stress.   Stress II: Relaxation -Discuss different types of relaxation techniques to limit stress.   Warning Signs of Stroke / TIA -Discuss definition of a stroke, what the signs and symptoms are of a stroke, and how to identify when someone is having stroke.   Knowledge Questionnaire Score:  Knowledge Questionnaire Score - 05/02/20 1151      Knowledge Questionnaire Score   Pre Score 23/24           Core Components/Risk Factors/Patient Goals at Admission:  Personal Goals and Risk Factors at Admission -  05/02/20 1151      Core Components/Risk Factors/Patient Goals on Admission   Hypertension Yes    Intervention Provide education on lifestyle modifcations including regular physical activity/exercise, weight management, moderate sodium restriction and increased consumption of fresh fruit, vegetables, and low fat dairy, alcohol moderation, and smoking cessation.;Monitor prescription use compliance.    Expected Outcomes Short Term: Continued assessment and intervention until BP is < 140/66mm HG in hypertensive participants. < 130/66mm HG in hypertensive participants with diabetes, heart failure or chronic kidney disease.;Long Term: Maintenance of blood pressure at goal levels.    Lipids Yes    Intervention Provide education and support for participant on nutrition & aerobic/resistive exercise along with prescribed medications to achieve LDL 70mg , HDL >40mg .    Expected Outcomes Short Term: Participant states understanding of desired cholesterol values and is compliant with medications prescribed. Participant is following exercise prescription and nutrition guidelines.;Long Term: Cholesterol controlled with medications as prescribed, with individualized exercise RX and with personalized nutrition plan. Value goals: LDL < 70mg , HDL > 40 mg.           Core Components/Risk Factors/Patient Goals Review:   Goals and Risk Factor Review    Row Name 05/08/20 1420 05/16/20  1546           Core Components/Risk Factors/Patient Goals Review   Personal Goals Review Lipids;Hypertension Lipids;Hypertension      Review Mrs Tatum started exercise on 05/08/20 and did well with exercise at a light level Mrs Minkin's vital signs have been stable. Patient is off to a good start to exercise at a light level      Expected Outcomes Mrs Kook will continue to participate in phase 2 exercise for exercise, nutrition and lifestyle modifications Mrs Rochel will continue to participate in phase 2 exercise for exercise, nutrition and lifestyle modifications             Core Components/Risk Factors/Patient Goals at Discharge (Final Review):   Goals and Risk Factor Review - 05/16/20 1546      Core Components/Risk Factors/Patient Goals Review   Personal Goals Review Lipids;Hypertension    Review Mrs Ailey's vital signs have been stable. Patient is off to a good start to exercise at a light level    Expected Outcomes Mrs Melder will continue to participate in phase 2 exercise for exercise, nutrition and lifestyle modifications           ITP Comments:  ITP Comments    Row Name 05/02/20 0754 05/16/20 1545         ITP Comments Dr. Fransico Him, Medical Director Cardiac Rehab [place] 30 Day ITP Review. Sheilia is off to a good start to exercise at phase 2 cardiac rehab. Patient has good participation and attendance so far             Comments: See ITP comments.Barnet Pall, RN,BSN 05/18/2020 3:27 PM

## 2020-05-19 ENCOUNTER — Encounter (HOSPITAL_COMMUNITY): Payer: PPO

## 2020-05-19 ENCOUNTER — Telehealth (HOSPITAL_COMMUNITY): Payer: Self-pay | Admitting: *Deleted

## 2020-05-22 ENCOUNTER — Encounter (HOSPITAL_COMMUNITY): Payer: PPO

## 2020-05-24 ENCOUNTER — Encounter (HOSPITAL_COMMUNITY): Payer: PPO

## 2020-05-25 DIAGNOSIS — E876 Hypokalemia: Secondary | ICD-10-CM | POA: Diagnosis not present

## 2020-05-25 DIAGNOSIS — I10 Essential (primary) hypertension: Secondary | ICD-10-CM | POA: Diagnosis not present

## 2020-05-26 ENCOUNTER — Encounter (HOSPITAL_COMMUNITY): Payer: PPO

## 2020-05-29 ENCOUNTER — Encounter (HOSPITAL_COMMUNITY): Payer: PPO

## 2020-05-29 ENCOUNTER — Telehealth (HOSPITAL_COMMUNITY): Payer: Self-pay | Admitting: *Deleted

## 2020-05-29 NOTE — Telephone Encounter (Signed)
Left message to call cardiac rehab. Will discharge as the patient was last in attendance on 9/03/2. Barnet Pall, RN,BSN 05/29/2020 11:35 AM

## 2020-05-30 ENCOUNTER — Telehealth: Payer: Self-pay | Admitting: Cardiology

## 2020-05-30 NOTE — Telephone Encounter (Signed)
Left message to call office

## 2020-05-30 NOTE — Telephone Encounter (Signed)
Pt c/o BP issue: STAT if pt c/o blurred vision, one-sided weakness or slurred speech  1. What are your last 5 BP readings? 111/69, 107/66, 103/67 - these were taking since 3:58pm today. She took it while on the phone, I was 134/94 HR 93  2. Are you having any other symptoms (ex. Dizziness, headache, blurred vision, passed out)? Dizziness, headaches, fatigue and pain under left shoulder. She states that her spironolactone (ALDACTONE) 25 MG tablet was doubled by her Endocrinologist. She is now taking 50mg   3. What is your BP issue? BP is dropping

## 2020-05-31 ENCOUNTER — Encounter (HOSPITAL_COMMUNITY): Payer: PPO

## 2020-05-31 MED ORDER — SPIRONOLACTONE 25 MG PO TABS: 50.0000 mg | ORAL_TABLET | Freq: Every day | ORAL | 11 refills | Status: DC

## 2020-05-31 NOTE — Telephone Encounter (Signed)
Spoke with pt and rhonda, the patient has increased her spironolactone to 50 mg daily for about 2 weeks now. She has noticed her bp is running low. She will have episodes where her bp will be 103/66 and at other times will be normal. She is wanting to know when she should call the office with a low bp. Tried to explain to the patient that number is different for different people but she insists there is a number and she wants to know when dr Martinique wants her to call with her low bp. Her dizziness can be with standing or sitting. Aware dr Martinique is not in the office but will forward for his review.

## 2020-05-31 NOTE — Telephone Encounter (Signed)
Left message for pt to call.

## 2020-05-31 NOTE — Telephone Encounter (Signed)
I would not be concerned unless BP less than 532 systolic  Andrian Urbach Martinique MD, [facility]

## 2020-06-01 DIAGNOSIS — E876 Hypokalemia: Secondary | ICD-10-CM | POA: Diagnosis not present

## 2020-06-01 DIAGNOSIS — I10 Essential (primary) hypertension: Secondary | ICD-10-CM | POA: Diagnosis not present

## 2020-06-01 DIAGNOSIS — E2689 Other hyperaldosteronism: Secondary | ICD-10-CM | POA: Diagnosis not present

## 2020-06-01 NOTE — Addendum Note (Signed)
Encounter addended by: Carma Lair on: 06/01/2020 4:43 PM  Actions taken: Flowsheet data copied forward, Flowsheet accepted

## 2020-06-02 ENCOUNTER — Encounter (HOSPITAL_COMMUNITY): Payer: PPO

## 2020-06-02 NOTE — Telephone Encounter (Signed)
Spoke to patient Dr.Jordan's advice given.Patient was upset.Stated she already saw endocrinologist and he decreased Aldactone to 50 mg daily.B/P yesterday 128/74.She will keep appointment already scheduled with Dr.Jordan 07/20/20 at 3:20 pm.

## 2020-06-05 ENCOUNTER — Encounter (HOSPITAL_COMMUNITY): Payer: PPO

## 2020-06-06 ENCOUNTER — Other Ambulatory Visit: Payer: Self-pay | Admitting: Cardiology

## 2020-06-06 ENCOUNTER — Telehealth (HOSPITAL_COMMUNITY): Payer: Self-pay | Admitting: *Deleted

## 2020-06-06 NOTE — Telephone Encounter (Signed)
Spoke with Mrs Nicoll she received the letter that I sent her. Mrs Nyland says she did not realize that I had called her from cardiac rehab when she did not see the main phone number from the department on her phone. Mrs Heatwole says that she would like to return to the program when she is feeling better as she had some medical issues that caused her not to attend.. I asked the patient to contact Dr Doug Sou office as she has been discharged from the program and her referral has been closed. Patient states understanding.Barnet Pall, RN,BSN 06/06/2020 1:49 PM

## 2020-06-07 ENCOUNTER — Encounter (HOSPITAL_COMMUNITY): Payer: PPO

## 2020-06-08 ENCOUNTER — Other Ambulatory Visit: Payer: Self-pay | Admitting: Cardiology

## 2020-06-09 ENCOUNTER — Encounter (HOSPITAL_COMMUNITY): Payer: PPO

## 2020-06-12 ENCOUNTER — Encounter (HOSPITAL_COMMUNITY): Payer: PPO

## 2020-06-14 ENCOUNTER — Encounter (HOSPITAL_COMMUNITY): Payer: PPO

## 2020-06-16 ENCOUNTER — Encounter (HOSPITAL_COMMUNITY): Payer: PPO

## 2020-06-19 ENCOUNTER — Encounter (HOSPITAL_COMMUNITY): Payer: PPO

## 2020-06-20 DIAGNOSIS — E2689 Other hyperaldosteronism: Secondary | ICD-10-CM | POA: Diagnosis not present

## 2020-06-20 DIAGNOSIS — I10 Essential (primary) hypertension: Secondary | ICD-10-CM | POA: Diagnosis not present

## 2020-06-20 DIAGNOSIS — M79604 Pain in right leg: Secondary | ICD-10-CM | POA: Diagnosis not present

## 2020-06-20 DIAGNOSIS — D509 Iron deficiency anemia, unspecified: Secondary | ICD-10-CM | POA: Diagnosis not present

## 2020-06-20 DIAGNOSIS — E876 Hypokalemia: Secondary | ICD-10-CM | POA: Diagnosis not present

## 2020-06-20 DIAGNOSIS — T148XXA Other injury of unspecified body region, initial encounter: Secondary | ICD-10-CM | POA: Diagnosis not present

## 2020-06-21 ENCOUNTER — Encounter (HOSPITAL_COMMUNITY): Payer: PPO

## 2020-06-22 DIAGNOSIS — D509 Iron deficiency anemia, unspecified: Secondary | ICD-10-CM | POA: Diagnosis not present

## 2020-06-23 ENCOUNTER — Encounter (HOSPITAL_COMMUNITY): Payer: PPO

## 2020-06-26 ENCOUNTER — Encounter (HOSPITAL_COMMUNITY): Payer: PPO

## 2020-06-28 ENCOUNTER — Encounter (HOSPITAL_COMMUNITY): Payer: PPO

## 2020-06-28 DIAGNOSIS — Z23 Encounter for immunization: Secondary | ICD-10-CM | POA: Diagnosis not present

## 2020-06-30 ENCOUNTER — Encounter (HOSPITAL_COMMUNITY): Payer: PPO

## 2020-07-03 ENCOUNTER — Ambulatory Visit: Payer: PPO | Attending: Internal Medicine

## 2020-07-03 DIAGNOSIS — Z23 Encounter for immunization: Secondary | ICD-10-CM

## 2020-07-03 NOTE — Progress Notes (Signed)
   Covid-19 Vaccination Clinic  Name:  Anne [name]    MRN: [id number] DOB: April 03, 1946  07/03/2020  [name] was observed post Covid-19 immunization for 15 minutes without incident. She was provided with Vaccine Information Sheet and instruction to access the V-Safe system.   [name] was instructed to call 911 with any severe reactions post vaccine: Marland Kitchen Difficulty breathing  . Swelling of face and throat  . A fast heartbeat  . A bad rash all over body  . Dizziness and weakness

## 2020-07-10 ENCOUNTER — Telehealth: Payer: Self-pay | Admitting: Cardiology

## 2020-07-10 NOTE — Telephone Encounter (Signed)
Pt states she was brushing her teeth yesterday morning when she began to bleed "profusely" from her gumline. Pt states she applied pressure and packed her inner lip with cotton. Pt estimates it took over an hour to get bleeding under control. Pt states the only thing that is different about her routine is that she has had her COVID booster and seasonal flu shot within the next 2 weeks. Pt states she has been on current medication since July 9.   Reached out to pharmacist, who advised that pt not take any herbal supplements such as fish oils that may exacerbate bleeding risk. Pt is to maintain her current medication regimen, including aspirin and Plavix. Pt also counseled to seek evaluation by a dentist, and she relates that she is scheduled for a dental exam/cleaning on 11/22. Pt states she would like Dr. Doug Sou input on any precautions related to dental care on her current medications at her next appointment on 07/20/20. Pt advised that an earlier appointment at our office is likely not necessary if bleeding from gums remains an isolated incident, but was instructed to seek urgent evaluation for any additional episodes of prolonged bleeding from gums.   Pt verbalizes understanding and agrees with plan. Will forward to MD for review.

## 2020-07-10 NOTE — Telephone Encounter (Signed)
° ° °  Rhonda RN from [employer] [facility], she said pt called them because pt had bleeding gums early yesterday. She said pt had difficulty stopping the bleeding, after applying pressure for couple of minute it finally stopped, she wanted to make sure Dr. Martinique is aware and also if Dr. Martinique would like to see her sooner than 11/11. She said to call the pt back

## 2020-07-12 DIAGNOSIS — H8111 Benign paroxysmal vertigo, right ear: Secondary | ICD-10-CM | POA: Diagnosis not present

## 2020-07-12 DIAGNOSIS — K068 Other specified disorders of gingiva and edentulous alveolar ridge: Secondary | ICD-10-CM | POA: Diagnosis not present

## 2020-07-12 NOTE — Telephone Encounter (Signed)
Dr. Martinique is currently away this week.  Agree with plan.

## 2020-07-18 NOTE — Progress Notes (Signed)
Cardiology Office Note   Date:  07/20/2020   ID:  Anne [name], DOB [birth year], MRN [id number]  PCP:  Johna Roles, PA  Cardiologist:   Loni Delbridge Martinique, MD   Chief Complaint  Patient presents with  . Follow-up    3 months.      History of Present Illness: Anne [name] is a 74 y.o. 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 She was admitted in early July with NSTEMI. Peak troponin 490. Cardiac cath showed a high grade proximal LAD stenosis that was treated with DES. She also was severely hypertensive. 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 She was placed on DAPT and aldactone. The patient does have a history of noncompliance.   She returned to the ED on 03/25/2020 with complaints in the infrascapular area, described as dull 7/10.  She denied any associated dyspnea or diaphoresis.  No nausea.  She was found to be hypertensive with right arm 178/95, left arm 164/84.  CT scan was completed to rule out thoracic aortic aneurysm.  CT did not reveal any evidence of aortic aneurysm.  Trace fluid within the pelvic cul-de-sac, tiny cystic lesions within the pancreas as seen previously.   On follow up today she is doing well  without chest pain or SOB. Her hand soreness has resolved. She did develop vertigo a couple of weeks ago that is improved with vestibular training. She did have some bleeding from her gums 2 weeks ago that resolved.  No hair loss. Is concerned her memory is not as good.     Past Medical History:  Diagnosis Date  . Complication of anesthesia    slow to wake up  . Diverticulosis   . Hiatal hernia   . Hypercholesterolemia   . Hypertension   . Osteopenia   . Vitamin D deficiency     Past Surgical History:  Procedure Laterality Date  . ABDOMINAL HYSTERECTOMY     Partial  . CORONARY STENT INTERVENTION N/A 03/16/2020   Procedure: CORONARY STENT INTERVENTION;  Surgeon:  Burnell Blanks, MD;  Location: [facility];  Service: Cardiovascular;  Laterality: N/A;  . fibrocystic breast excision    . LAPAROSCOPIC ILEOCECECTOMY  07/07/2015  . LAPAROTOMY N/A 07/07/2015   Procedure: EXPLORATORY LAPAROTOMY WITH ILEOCECECTOMY ;  Surgeon: Donnie Mesa, MD;  Location: [facility];  Service: General;  Laterality: N/A;  . LEFT HEART CATH AND CORONARY ANGIOGRAPHY N/A 03/16/2020   Procedure: LEFT HEART CATH AND CORONARY ANGIOGRAPHY;  Surgeon: Burnell Blanks, MD;  Location: [facility];  Service: Cardiovascular;  Laterality: N/A;  . TUBAL LIGATION       Current Outpatient Medications  Medication Sig Dispense Refill  . aspirin 81 MG EC tablet TAKE 1 TABLET (81 MG TOTAL) BY MOUTH DAILY. SWALLOW WHOLE. 90 tablet 3  . atorvastatin (LIPITOR) 80 MG tablet TAKE 1 TABLET BY MOUTH EVERY DAY 90 tablet 3  . B Complex-C (B-COMPLEX WITH VITAMIN C) tablet Take 1 tablet by mouth daily.    . carvedilol (COREG) 3.125 MG tablet TAKE 1 TABLET (3.125 MG TOTAL) BY MOUTH 2 (TWO) TIMES DAILY WITH A MEAL. 180 tablet 3  . clopidogrel (PLAVIX) 75 MG tablet TAKE 1 TABLET (75 MG TOTAL) BY MOUTH DAILY WITH BREAKFAST. 90 tablet 3  . GARLIC PO Take 2 tablets by mouth daily.     . Magnesium 200 MG TABS Take 200 tablets by mouth.    Marland Kitchen  Multiple Vitamins-Minerals (MULTIVITAMIN WITH MINERALS) tablet Take 1 tablet by mouth daily.    Marland Kitchen spironolactone (ALDACTONE) 25 MG tablet Take 2 tablets (50 mg total) by mouth daily. 30 tablet 11  . nitroGLYCERIN (NITROSTAT) 0.4 MG SL tablet Place 1 tablet (0.4 mg total) under the tongue every 5 (five) minutes as needed for chest pain. May repeat 2 times, if 3rd dose needed call 911 (Patient not taking: Reported on 05/02/2020) 25 tablet 3   No current facility-administered medications for this visit.    Allergies:   Amiloride; Anesthetics, amide; Other; Prilocaine; Amlodipine besylate; Asa [aspirin]; Diltiazem hcl; Diltiazem hcl; Eplerenone;  Indomethacin; Naproxen; Norvasc [amlodipine besylate]; Tenex [guanfacine hcl]; Triamterene-hctz; Potassium chloride; Hytrin [terazosin hcl]; and Lisinopril    Social History:  The patient  reports that she has never smoked. She has never used smokeless tobacco. She reports current alcohol use. She reports that she does not use drugs.   Family History:  The patient's family history includes Hypertension in her mother; Stroke in her brother and mother.    ROS:  Please see the history of present illness.   Otherwise, review of systems are positive for none.   All other systems are reviewed and negative.    PHYSICAL EXAM: VS:  BP 138/70 (BP Location: Left Arm, Patient Position: Sitting, Cuff Size: Normal)   Pulse 74   Ht 5' 5.75" (1.67 m)   Wt 136 lb (61.7 kg)   BMI 22.12 kg/m  , BMI Body mass index is 22.12 kg/m. GEN: Well nourished, well developed, in no acute distress  HEENT: normal  Neck: no JVD, carotid bruits, or masses Cardiac: RRR; no murmurs, rubs, or gallops,no edema  Respiratory:  clear to auscultation bilaterally, normal work of breathing GI: soft, nontender, nondistended, + BS MS: no deformity or atrophy  Skin: warm and dry, no rash Neuro:  Strength and sensation are intact Psych: euthymic mood, full affect   EKG:  EKG is not ordered today. The ekg ordered today demonstrates N/A   Recent Labs: 12/21/2019: TSH 2.747 03/15/2020: ALT 13 03/16/2020: Magnesium 2.0 04/18/2020: BUN 7; Creatinine, Ser 0.88; Hemoglobin 10.4; Platelets 182; Potassium 3.9; Sodium 140    Lipid Panel    Component Value Date/Time   CHOL 195 03/16/2020 0517   TRIG 57 03/16/2020 0517   HDL 61 03/16/2020 0517   CHOLHDL 3.2 03/16/2020 0517   VLDL 11 03/16/2020 0517   LDLCALC 123 (H) 03/16/2020 0517   LDLDIRECT 158.7 09/30/2013 1255    Dated 06/20/20: creatinine 1.06. potassium 4.8.  Wt Readings from Last 3 Encounters:  07/20/20 136 lb (61.7 kg)  05/16/20 139 lb 8.8 oz (63.3 kg)  05/02/20  139 lb 8.8 oz (63.3 kg)      Other studies Reviewed: Additional studies/ records that were reviewed today include:   Echo 03/16/20: IMPRESSIONS    1. Left ventricular ejection fraction, by estimation, is 55 to 60%. The  left ventricle has normal function. The left ventricle has no regional  wall motion abnormalities. There is mild left ventricular hypertrophy.  Left ventricular diastolic parameters  are consistent with Grade I diastolic dysfunction (impaired relaxation).  2. Right ventricular systolic function is normal. The right ventricular  size is normal. There is normal pulmonary artery systolic pressure. The  estimated right ventricular systolic pressure is 41.9 mmHg.  3. Left atrial size was mildly dilated.  4. The mitral valve is normal in structure. No evidence of mitral valve  regurgitation. No evidence of mitral stenosis.  5.  The aortic valve is tricuspid. Aortic valve regurgitation is not  visualized. No aortic stenosis is present.  6. The inferior vena cava is normal in size with greater than 50%  respiratory variability, suggesting right atrial pressure of 3 mmHg.  Cardiac cath 03/16/20:  CORONARY STENT INTERVENTION  LEFT HEART CATH AND CORONARY ANGIOGRAPHY  Conclusion    Prox RCA lesion is 20% stenosed.  Mid Cx to Dist Cx lesion is 20% stenosed.  Prox LAD lesion is 95% stenosed.  A drug-eluting stent was successfully placed using a SYNERGY XD 3.50X12.  Post intervention, there is a 0% residual stenosis.   1. Severe stenosis proximal LAD 2. Successful PTCA/DES x 1 proximal LAD 3. Mild non-obstructive disease in the Circumflex and RCA  Recommendations: DAPT with ASA and Plavix for one year. Continue statin and beta blocker. Likely d/c home tomorrow.      ASSESSMENT AND PLAN:  1.  CAD s/p NSTEMI in July. Had stenting of high grade proximal LAD stenosis with DES. Needs DAPT for one year. She does have some mild bruising. On high dose statin. Heart  healthy diet and regular aerobic activity.  2. HTN uncontrolled secondary to primary hyperaldosteronism. Now followed by Dr Buddy Duty. BP is markedly improved on aldactone.    3. HLD now on high dose statin. Labs followed by primary care. Goal LDL < 70. Recommend checking a lipid panel and hepatic profile. Given Rx for labs to be drawn at primary care.     Labs/ tests ordered today include:  No orders of the defined types were placed in this encounter.    Disposition:   FU with me in 6 months  Signed, Albino Bufford Martinique, MD  07/20/2020 3:46 PM    [facility] [address] [place], [place], [address] Phone [phone], Fax [phone]

## 2020-07-20 ENCOUNTER — Other Ambulatory Visit: Payer: Self-pay

## 2020-07-20 ENCOUNTER — Encounter: Payer: Self-pay | Admitting: Cardiology

## 2020-07-20 ENCOUNTER — Ambulatory Visit: Payer: PPO | Admitting: Cardiology

## 2020-07-20 VITALS — BP 138/70 | HR 74 | Ht 65.75 in | Wt 136.0 lb

## 2020-07-20 DIAGNOSIS — E269 Hyperaldosteronism, unspecified: Secondary | ICD-10-CM

## 2020-07-20 DIAGNOSIS — I152 Hypertension secondary to endocrine disorders: Secondary | ICD-10-CM | POA: Diagnosis not present

## 2020-07-20 DIAGNOSIS — I2510 Atherosclerotic heart disease of native coronary artery without angina pectoris: Secondary | ICD-10-CM

## 2020-07-20 DIAGNOSIS — E7800 Pure hypercholesterolemia, unspecified: Secondary | ICD-10-CM | POA: Diagnosis not present

## 2020-07-27 DIAGNOSIS — E7800 Pure hypercholesterolemia, unspecified: Secondary | ICD-10-CM | POA: Diagnosis not present

## 2020-08-23 ENCOUNTER — Telehealth: Payer: Self-pay | Admitting: Cardiology

## 2020-08-23 NOTE — Telephone Encounter (Signed)
Ok with me  Anne Steffensmeier MD, FACC  

## 2020-08-23 NOTE — Telephone Encounter (Signed)
Anne [name] is calling requesting a Provider Switch from Dr. Martinique to Dr. Harrell Gave due to her having more open availability and wanting a female provider. Please advise.

## 2020-08-25 ENCOUNTER — Telehealth: Payer: Self-pay | Admitting: Cardiology

## 2020-08-25 DIAGNOSIS — R42 Dizziness and giddiness: Secondary | ICD-10-CM | POA: Diagnosis not present

## 2020-08-25 DIAGNOSIS — R252 Cramp and spasm: Secondary | ICD-10-CM | POA: Diagnosis not present

## 2020-08-25 NOTE — Telephone Encounter (Signed)
Returned call to patient of Dr. Martinique - who recently requested change to Dr. Harrell Gave (08/23/20 phone note). Dr. Martinique has OK'ed change, pending Dr. Harrell Gave approval.  Patient reports cramping in her hands, cramping in her leg muscles, states her toes separate.  She said she has pain her back. She also reports dizziness, feels like she will pass out.  She has also not checked her BP/HR recently, although states she has a means to do so. She then states she has had the best readings she has had since age 74. She had heart attack in July - states her body has not adjusted, symptoms are worsening -- feels she may be overmedicated, said carvedilol makes her weak, makes her feel like she will faint.  She comments on her total cholesterol (from PCP) which is now 129 -- her numbers are the best she has seen since age 74 She also states she has lost about 30lbs.  She also states she has constant diarrhea. She reports this is due to her medications - states she takes 5 meds at one time so is unsure what is causing it. She states the diarrhea is uncontrollable, has to wear pads due to this.   Patient did express concerns about the change in provider process, accessibility..  She states she was one of the first [facility] members, deserves better treatment. She states she is concerned that she is not seeing her cardiologist frequent enough - explained she was last seen 1 month ago (07/20/20) and was advised to follow up in 3 months. Since this visit, she requested a provider change and she is upset that she has not received a response about this and her call was on 12/15. Explained that there was no medical concern attached to the 12/15 phone note, just a provide change request -  I read to her verbatim the note from the operator which patient states did not paint her concerns in an accurate light - she states based on that message, Dr. Harrell Gave would in no way accept her as a patient, states the operator  "sabotaged her" by not recording her symptoms, medical concerns. Patient also states she called in yesterday but there is no phone note regarding this, only phone note is 12/15 -- patient states she told the operator her medical concerns that I am discussing with her now on Wednesday & Thursday  She states she likes Dr. Martinique as a doctor, but he does not have much availability.  I apologized to patient for what sounds like breakdown in communication, timeliness of reply concerning provider change request. Explained that Dr. Harrell Gave has not declined accepting her as a patient, just has not reviewed the message.   Attempted to redirect patient back to medical concerns: - weight loss -  - diarrhea -  - dizziness -  - cramping - could this be r/t statin? Unfortunately I was unable to attempt to address these as I offered to send a message to Dr. Martinique to review/address her concerns, but she states Dr. Martinique has already released her so she declined. Explained that Dr. Harrell Gave (who she has requested) cannot provide medical advice at this point, as she has not seen her yet.   I advised I can send this message with her concerns to our practice adminstrator to contact her to discuss further as I am unable to assist at this juncture. She is amendable to this. I will route to Mickel Baas   Total time on call: 40 minutes

## 2020-08-25 NOTE — Telephone Encounter (Signed)
STAT if patient feels like he/she is going to faint   1) Are you dizzy now? yes  2) Do you feel faint or have you passed out? States she gets to the borderline of passing out, and she has to go to sleep, states she does feel faint now    3) Do you have any other symptoms? Weakness, pains in back, cramping  4) Have you checked your HR and BP (record if available)? No   Patient states she has been having severe dizziness, weakness, and pain in her back. She states she also gets cramping in her fingers, feet, legs, and toes. She states when she has a heart attack it started as pain in her back. Patient asked for an appointment as soon as possible with a PA, since she is waiting on a provider switch to see Dr. Harrell Gave. I did not see anything available until January and she was very frustrated. She states that she is going to lay dawn, because she feels like she will faint.

## 2020-09-03 NOTE — Telephone Encounter (Signed)
Ok by me

## 2020-10-09 DIAGNOSIS — R42 Dizziness and giddiness: Secondary | ICD-10-CM | POA: Diagnosis not present

## 2020-10-09 DIAGNOSIS — I10 Essential (primary) hypertension: Secondary | ICD-10-CM | POA: Diagnosis not present

## 2020-11-08 DIAGNOSIS — R002 Palpitations: Secondary | ICD-10-CM | POA: Diagnosis not present

## 2020-11-08 DIAGNOSIS — E7800 Pure hypercholesterolemia, unspecified: Secondary | ICD-10-CM | POA: Diagnosis not present

## 2020-11-08 DIAGNOSIS — I252 Old myocardial infarction: Secondary | ICD-10-CM | POA: Diagnosis not present

## 2020-11-08 DIAGNOSIS — I10 Essential (primary) hypertension: Secondary | ICD-10-CM | POA: Diagnosis not present

## 2020-11-08 DIAGNOSIS — Z5181 Encounter for therapeutic drug level monitoring: Secondary | ICD-10-CM | POA: Diagnosis not present

## 2020-11-13 DIAGNOSIS — R002 Palpitations: Secondary | ICD-10-CM | POA: Diagnosis not present

## 2020-11-13 DIAGNOSIS — D229 Melanocytic nevi, unspecified: Secondary | ICD-10-CM | POA: Diagnosis not present

## 2020-11-13 DIAGNOSIS — R634 Abnormal weight loss: Secondary | ICD-10-CM | POA: Diagnosis not present

## 2020-11-13 DIAGNOSIS — L821 Other seborrheic keratosis: Secondary | ICD-10-CM | POA: Diagnosis not present

## 2020-12-06 DIAGNOSIS — R002 Palpitations: Secondary | ICD-10-CM | POA: Diagnosis not present

## 2020-12-07 DIAGNOSIS — R002 Palpitations: Secondary | ICD-10-CM | POA: Diagnosis not present

## 2021-01-11 ENCOUNTER — Other Ambulatory Visit: Payer: Self-pay | Admitting: Physician Assistant

## 2021-01-11 DIAGNOSIS — Z1231 Encounter for screening mammogram for malignant neoplasm of breast: Secondary | ICD-10-CM

## 2021-01-15 DIAGNOSIS — R194 Change in bowel habit: Secondary | ICD-10-CM | POA: Diagnosis not present

## 2021-01-15 DIAGNOSIS — Z1211 Encounter for screening for malignant neoplasm of colon: Secondary | ICD-10-CM | POA: Diagnosis not present

## 2021-01-15 DIAGNOSIS — N644 Mastodynia: Secondary | ICD-10-CM | POA: Diagnosis not present

## 2021-01-15 DIAGNOSIS — K5900 Constipation, unspecified: Secondary | ICD-10-CM | POA: Diagnosis not present

## 2021-01-17 ENCOUNTER — Ambulatory Visit
Admission: RE | Admit: 2021-01-17 | Discharge: 2021-01-17 | Disposition: A | Discharge status: Home / Self Care | Payer: PPO | Source: Ambulatory Visit | Attending: Gastroenterology | Admitting: Gastroenterology

## 2021-01-17 ENCOUNTER — Telehealth: Payer: Self-pay | Admitting: *Deleted

## 2021-01-17 ENCOUNTER — Other Ambulatory Visit: Payer: Self-pay | Admitting: Gastroenterology

## 2021-01-17 DIAGNOSIS — Z1211 Encounter for screening for malignant neoplasm of colon: Secondary | ICD-10-CM | POA: Diagnosis not present

## 2021-01-17 DIAGNOSIS — M419 Scoliosis, unspecified: Secondary | ICD-10-CM | POA: Diagnosis not present

## 2021-01-17 DIAGNOSIS — K5900 Constipation, unspecified: Secondary | ICD-10-CM | POA: Diagnosis not present

## 2021-01-17 DIAGNOSIS — I700 Atherosclerosis of aorta: Secondary | ICD-10-CM | POA: Diagnosis not present

## 2021-01-17 DIAGNOSIS — R110 Nausea: Secondary | ICD-10-CM | POA: Diagnosis not present

## 2021-01-17 NOTE — Telephone Encounter (Signed)
   [facility] Pre-operative Risk Assessment    Patient Name: KALEB LINQUIST  DOB: [birth year]  MRN: [id number]   HEARTCARE STAFF: - Please ensure there is not already an duplicate clearance open for this procedure. - Under Visit Info/Reason for Call, type in Other and utilize the format Clearance MM/DD/YY or Clearance TBD. Do not use dashes or single digits. - If request is for dental extraction, please clarify the # of teeth to be extracted.  Request for surgical clearance:  1. What type of surgery is being performed? COLONOSCOPY   2. When is this surgery scheduled? 04/13/21   3. What type of clearance is required (medical clearance vs. Pharmacy clearance to hold med vs. Both)? MEDICAL  4. Are there any medications that need to be held prior to surgery and how long? PLAVIX    5. Practice name and name of physician performing surgery? [facility]; DR. MAGOD   6. What is the office phone number? [phone]   7.   What is the office fax number? [phone]  8.   Anesthesia type (None, local, MAC, general) ? PROPOFOL   Julaine Hua 01/17/2021, 2:15 PM  _________________________________________________________________   (provider comments below)

## 2021-01-17 NOTE — Telephone Encounter (Signed)
    Anne [name] DOB:  1946/03/11  MRN:  [id number]   Primary Cardiologist: Peter Martinique, MD, cleared to transition care to Dr. Harrell Gave  Chart reviewed as part of pre-operative protocol coverage.   Patient has a history of CAD s/p NSTEMI 03/2020 with PCI/DES to LAD. Recommended for DAPT x1 year.   Now planning for colonoscopy 04/13/21  Dr. Martinique, any objection to holding plavix 5 days prior to her colonoscopy as she will be over a year out from her NSTEMI? Sending your way since Dr. Harrell Gave has not established care with the patient yet. Please route your response back to [facility] PREOP. Thanks in advance for your help!  Abigail Butts, PA-C 01/17/2021, 5:08 PM

## 2021-01-18 NOTE — Telephone Encounter (Signed)
Left message for pt to call the office to schedule a pre op appt with Dr. Al Pimple or APP.

## 2021-01-18 NOTE — Telephone Encounter (Signed)
Yes OK to hold Plavix for 5 days for colonoscopy  Kandie Keiper Martinique MD, [facility]

## 2021-01-18 NOTE — Telephone Encounter (Signed)
   Name: Anne [name]  DOB: Sep 24, 1945  MRN: [id number]  Primary Cardiologist: Buford Dresser, MD  Chart reviewed as part of pre-operative protocol coverage. Because of [name]'s past medical history and time since last visit, she will require a follow-up visit in order to better assess preoperative cardiovascular risk.  She was last seen 07/20/20 and recommended for 6 month follow up. She has transitioned her care to Dr. Harrell Gave but not yet had an appointment.   Please schedule appointment with Dr. Harrell Gave prior to colonoscopy 04/13/21. She may be seen at [facility] office or at [facility] office after Dr. Harrell Gave moves to that location in July.  Pre-op covering staff: - Please schedule appointment and call patient to inform them. If patient already had an upcoming appointment within acceptable timeframe, please add "pre-op clearance" to the appointment notes so provider is aware. - Please contact requesting surgeon's office via preferred method (i.e, phone, fax) to inform them of need for appointment prior to surgery.  If applicable, this message will also be routed to pharmacy pool and/or primary cardiologist for input on holding anticoagulant/antiplatelet agent as requested below so that this information is available to the clearing provider at time of patient's appointment.   Loel Dubonnet, NP  01/18/2021, 2:01 PM

## 2021-01-19 ENCOUNTER — Other Ambulatory Visit: Payer: Self-pay | Admitting: Physician Assistant

## 2021-01-19 DIAGNOSIS — N644 Mastodynia: Secondary | ICD-10-CM

## 2021-01-19 NOTE — Telephone Encounter (Signed)
Left message x 2 for pt to call back to schedule a pre op appt. Left message though her procedure is not until 04/2021 provider schedules are booking up quickly.

## 2021-01-19 NOTE — Telephone Encounter (Signed)
Pt has called back and has been scheduled for her pre op appt 03/27/21 @ 9 am with Dr. Andree Elk at the [facility] location. I will send clearance note to MD for upcoming appt and send FYI to requesting office pt has appt 03/27/21.

## 2021-01-19 NOTE — Telephone Encounter (Signed)
Clearance to be addressed at upcoming office visit. Referring provider aware. Will remove from preop pool.   Loel Dubonnet, NP

## 2021-01-29 ENCOUNTER — Other Ambulatory Visit: Payer: Self-pay | Admitting: Physician Assistant

## 2021-01-29 ENCOUNTER — Other Ambulatory Visit: Payer: Self-pay

## 2021-01-29 ENCOUNTER — Ambulatory Visit
Admission: RE | Admit: 2021-01-29 | Discharge: 2021-01-29 | Disposition: A | Discharge status: Home / Self Care | Payer: PPO | Source: Ambulatory Visit | Attending: Physician Assistant | Admitting: Physician Assistant

## 2021-01-29 ENCOUNTER — Ambulatory Visit: Admission: RE | Admit: 2021-01-29 | Payer: PPO | Source: Ambulatory Visit

## 2021-01-29 DIAGNOSIS — N644 Mastodynia: Secondary | ICD-10-CM | POA: Diagnosis not present

## 2021-01-29 DIAGNOSIS — R922 Inconclusive mammogram: Secondary | ICD-10-CM | POA: Diagnosis not present

## 2021-01-31 IMAGING — CT CT ANGIO CHEST-ABD-PELV FOR DISSECTION W/ AND WO/W CM
2 of 7 series · 14 of 46 positions shown, 16 images · IV contrast (OMNI 350)
Comparison: 03/15/2020 chest radiograph. CT of the abdomen and
pelvis of 12/21/2019.

CLINICAL DATA: Posterior chest pain. Thoracic aortic aneurysm
suspected. Status post cardiac catheterization and stent placement
on [DATE].

EXAM:
CT ANGIOGRAPHY CHEST, ABDOMEN AND PELVIS
TECHNIQUE: Multidetector CT imaging through the chest, abdomen and pelvis was
performed using the standard protocol during bolus administration of
intravenous contrast. Multiplanar reconstructed images and MIPs were
obtained and reviewed to evaluate the vascular anatomy.
CONTRAST:  80mL OMNIPAQUE IOHEXOL 350 MG/ML SOLN

[Series 7: dissection 2mm · axial · 0.73mm/px · z∈[-412,+128]mm · 11 of 304 slices shown, 13 images]
[im 17/304  soft-tissue]
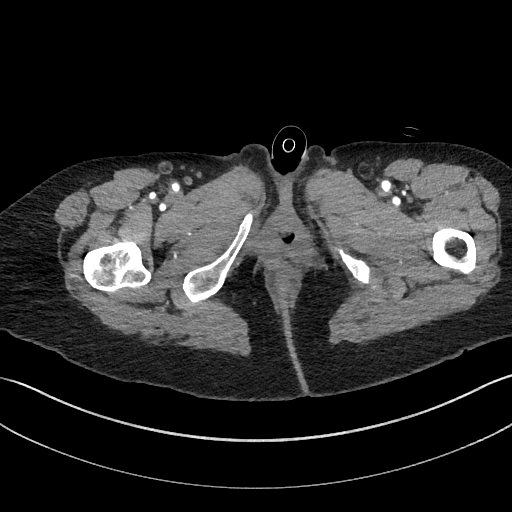
[im 17/304  bone]
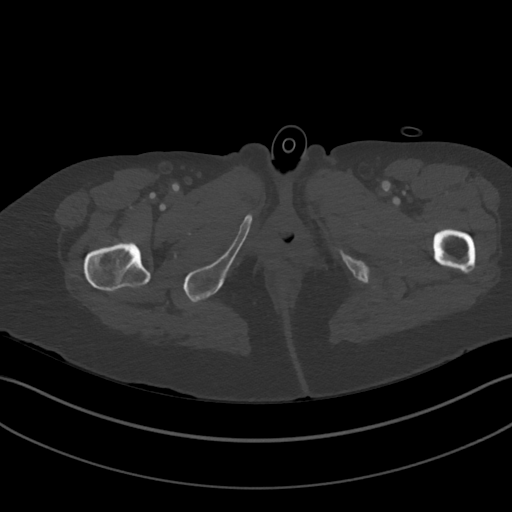
[im 51/304  soft-tissue]
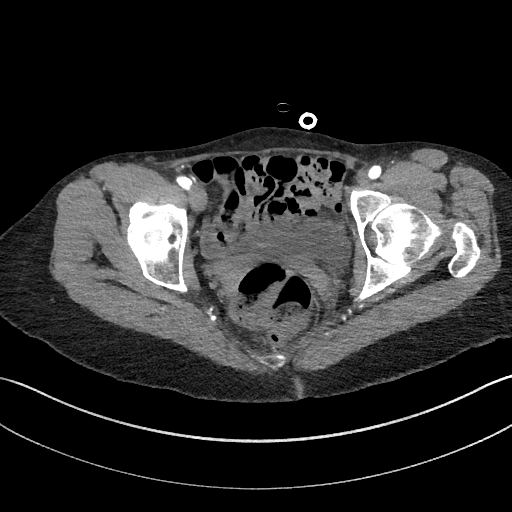
[im 68/304  soft-tissue]
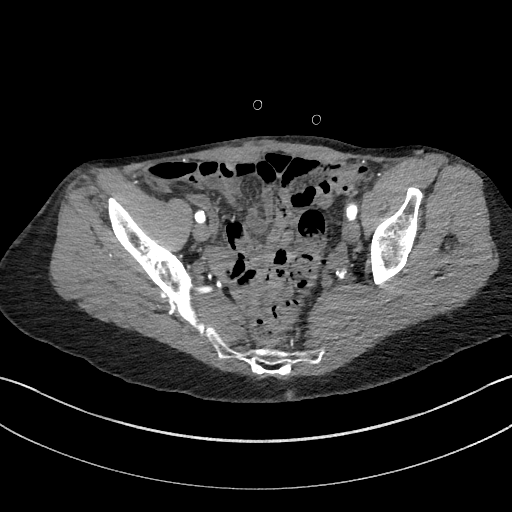
[im 102/304  soft-tissue]
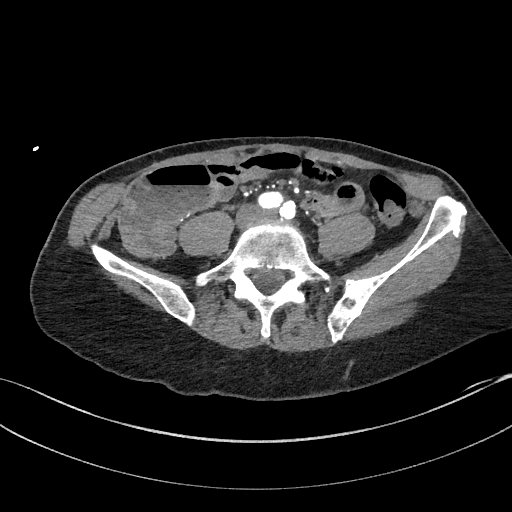
[im 118/304  soft-tissue]
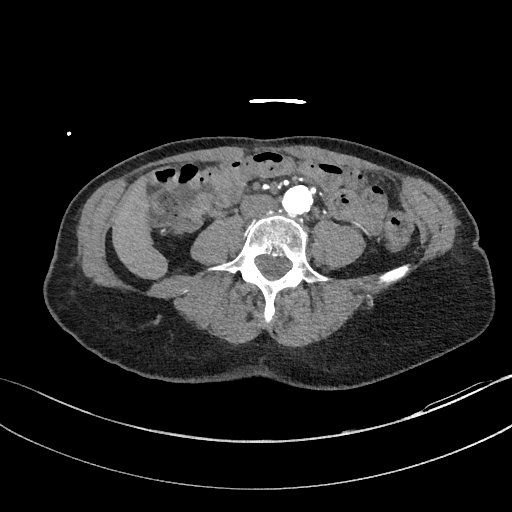
[im 152/304  soft-tissue]
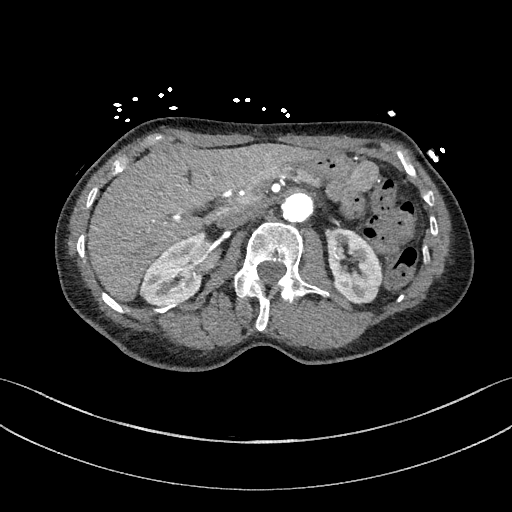
[im 186/304  soft-tissue]
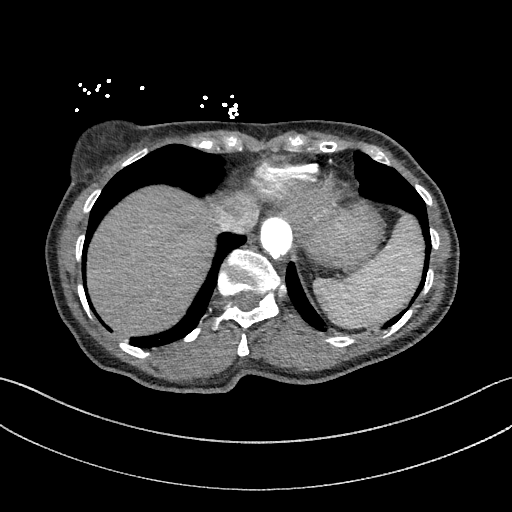
[im 203/304  soft-tissue]
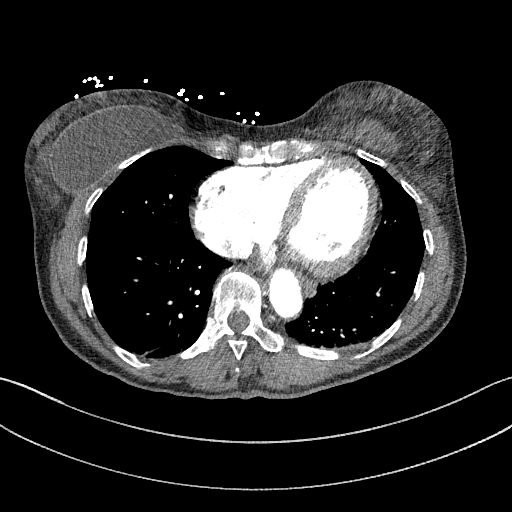
[im 236/304  soft-tissue]
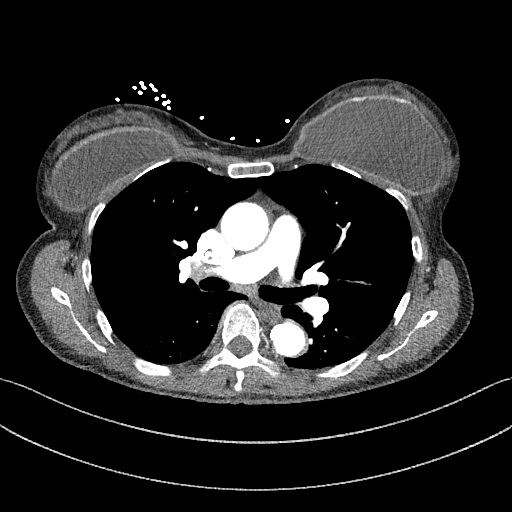
[im 236/304  bone]
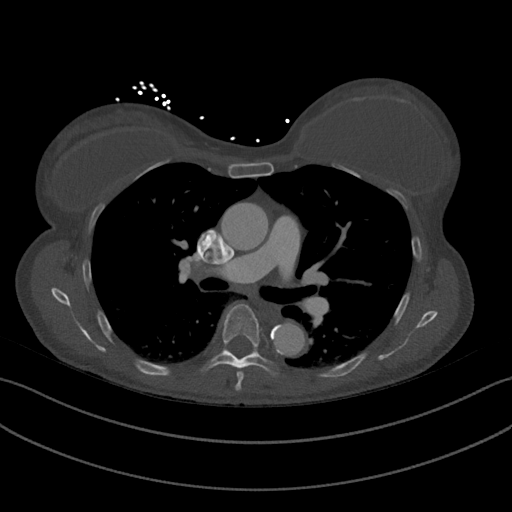
[im 253/304  soft-tissue]
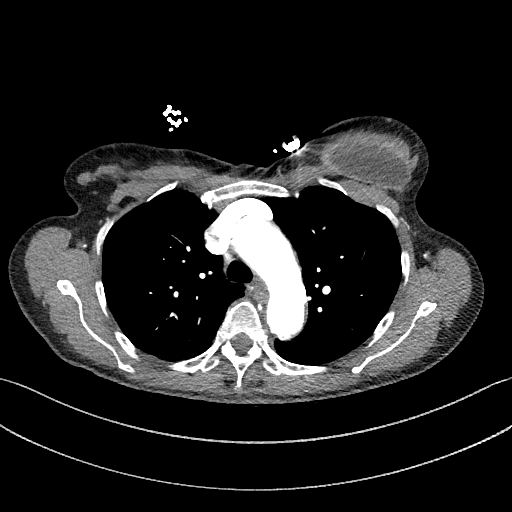
[im 287/304  soft-tissue]
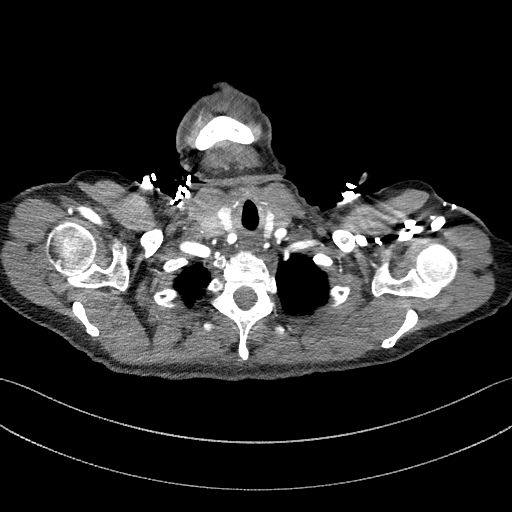

[Series 10: dissection 2mm cor · coronal · 0.75mm/px · 3 of 98 slices shown]
[im 25/98  soft-tissue]
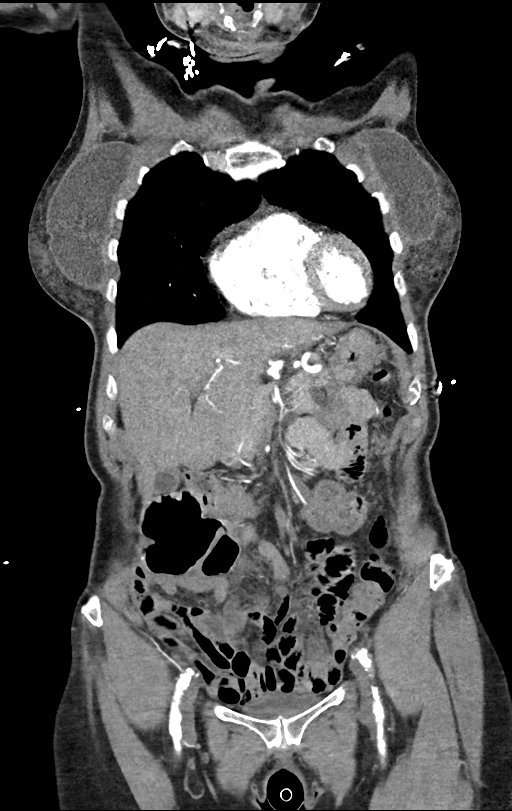
[im 49/98  soft-tissue]
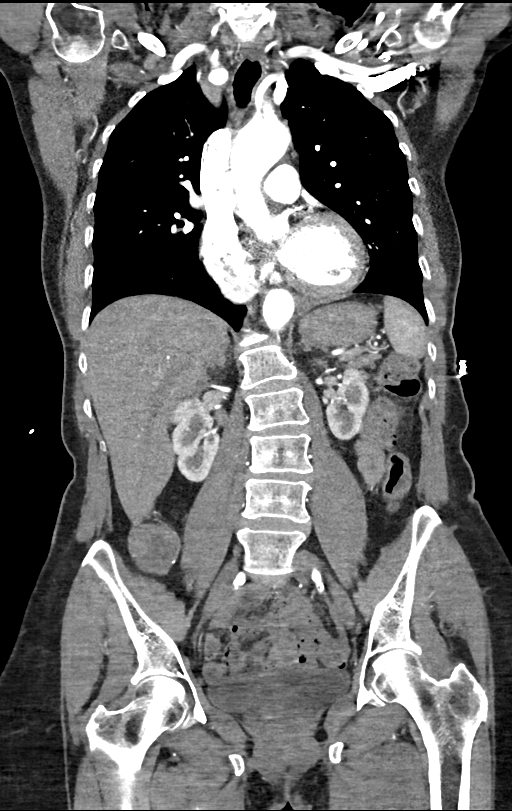
[im 73/98  soft-tissue]
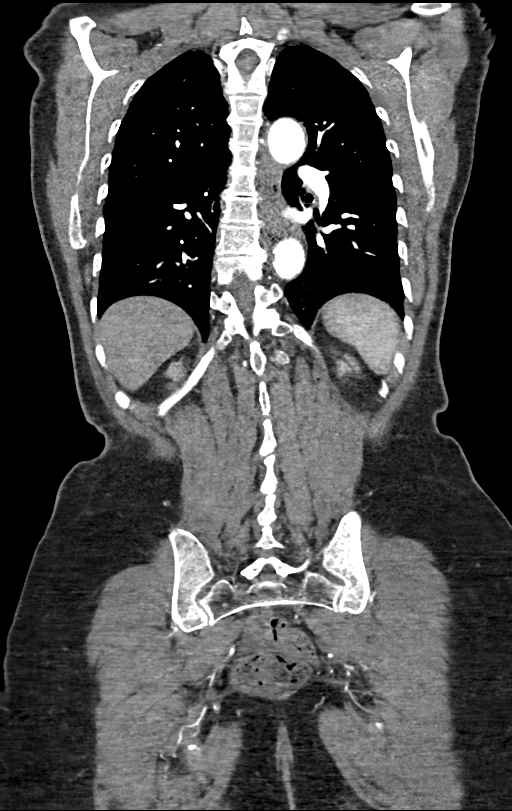

[14 of 46 positions shown; findings below may reference images not displayed]

FINDINGS: CTA CHEST FINDINGS

Cardiovascular: No intramural hematoma on noncontrast imaging.
Aortic atherosclerosis. No aortic dissection. Mild cardiomegaly,
without pericardial effusion. Multivessel coronary artery
atherosclerosis. No pulmonary embolism.

Mediastinum/Nodes: No mediastinal or hilar adenopathy.

Lungs/Pleura: No pleural fluid.  Bibasilar subsegmental atelectasis.

Musculoskeletal: No acute osseous abnormality. Bilateral breast
implants.

Review of the MIP images confirms the above findings.

CTA ABDOMEN AND PELVIS FINDINGS

VASCULAR

Aorta: Aortic atherosclerosis. Normal aortic caliber, without
dissection.

Celiac: Widely patent.

SMA: Within normal limits.

Renals: Single renal arteries, without significant stenosis.

IMA: Patent.

Inflow: Atherosclerosis within the iliac vasculature, without
significant stenosis.

Veins: Not well evaluated.

Review of the MIP images confirms the above findings.

NON-VASCULAR

Hepatobiliary: Normal liver. Normal gallbladder, without biliary
ductal dilatation.

Pancreas: Cystic pancreatic body lesions of up to 11 mm have been
detailed previously and are similar. No duct dilatation or acute
inflammation.

Spleen: Normal in size, without focal abnormality.

Adrenals/Urinary Tract: Normal adrenal glands. Mild bilateral renal
cortical scarring. Lower pole right renal 1.0 cm lesion measures
slightly greater than fluid density today, but 19 HU on the prior
exam, favoring a minimally complex cyst. No hydronephrosis. Normal
urinary bladder.

Stomach/Bowel: Normal stomach, without wall thickening. Surgical
changes about the right-side of the colon. Normal small bowel.

Lymphatic: No abdominopelvic adenopathy.

Reproductive: Hysterectomy versus uterine atrophy.

Other: Trace fluid within the pelvic cul-de-sac on 245/7, new since
the prior. No free intraperitoneal air.

Musculoskeletal: S shaped thoracolumbar spine curvature.

Review of the MIP images confirms the above findings.
IMPRESSION: 1. No evidence of aortic aneurysm.
2. Trace fluid within the pelvic cul-de-sac, nonspecific.
3. Tiny cystic lesions within the pancreas, as before. Please see
report of CT of 12/21/2019.
4. Mild cardiomegaly.  Coronary artery atherosclerosis.

## 2021-02-26 ENCOUNTER — Telehealth: Payer: Self-pay | Admitting: Cardiology

## 2021-02-26 NOTE — Telephone Encounter (Signed)
Returned call to patient who states that she has been experiencing severe dizziness and hair loss. Patient states that the dizziness she has been experiencing is all the time, and usually starts about 34mins after taking her medications. Patient states that her dizziness is now every day. Patient states that her BP has been running around 125/70 and is unable to provide HR readings but states that it has been fine. Patient states that she recently did not renew her license due to her issues with dizziness/lightheadedness. Patient denies any shortness of breath, chest pain, swelling, syncope or any other symptoms. Patient states she would like a sooner appointment to address these concerns.   Patient also states that she has been losing her hair, and states that she was seen by dermatology and her PCP and was told that it was due to her Clopidogrel. Patient states that she was told by both dermatology and her PCP to follow up with her cardiologist to discuss both her dizziness and hair loss.   Made patient an appointment with Almyra Deforest PA-C on Thursday 6/23 at 3:15pm.   Made patient aware of ED precautions should new or worsening symptoms develop. Patient verbalized understanding.

## 2021-02-26 NOTE — Telephone Encounter (Signed)
Pt c/o medication issue: 1. Name of Medication: Clopidogrel  2. How are you currently taking this medication (dosage and times per day)? Once a day  3. Are you having a reaction (difficulty breathing--STAT)?  No  4. What is your medication issue? Patient hair is coming out like a cancer patient   STAT if patient feels like he/she is going to faint   Are you dizzy now? Yes   Do you feel faint or have you passed out? At time she feel faint   Do you have any other symptoms? No   Have you checked your HR and BP (record if available)? Not today

## 2021-03-01 ENCOUNTER — Ambulatory Visit (INDEPENDENT_AMBULATORY_CARE_PROVIDER_SITE_OTHER): Payer: PPO | Admitting: Physician Assistant

## 2021-03-01 ENCOUNTER — Encounter: Payer: Self-pay | Admitting: Physician Assistant

## 2021-03-01 ENCOUNTER — Other Ambulatory Visit: Payer: Self-pay

## 2021-03-01 VITALS — Ht 65.0 in | Wt 127.0 lb

## 2021-03-01 DIAGNOSIS — R42 Dizziness and giddiness: Secondary | ICD-10-CM

## 2021-03-01 DIAGNOSIS — L659 Nonscarring hair loss, unspecified: Secondary | ICD-10-CM

## 2021-03-01 DIAGNOSIS — I2510 Atherosclerotic heart disease of native coronary artery without angina pectoris: Secondary | ICD-10-CM

## 2021-03-01 DIAGNOSIS — E785 Hyperlipidemia, unspecified: Secondary | ICD-10-CM

## 2021-03-01 DIAGNOSIS — I10 Essential (primary) hypertension: Secondary | ICD-10-CM | POA: Diagnosis not present

## 2021-03-01 MED ORDER — NITROGLYCERIN 0.4 MG SL SUBL: 0.4000 mg | SUBLINGUAL_TABLET | SUBLINGUAL | 3 refills | Status: DC | PRN

## 2021-03-01 MED ORDER — SPIRONOLACTONE 25 MG PO TABS
25.0000 mg | ORAL_TABLET | Freq: Two times a day (BID) | ORAL | 3 refills | Status: DC
Start: 2021-03-01 — End: 2021-03-02

## 2021-03-01 NOTE — Patient Instructions (Signed)
Medication Instructions:  TAKE Spironolactone to 25 mg 2 times a day  STOP Plavix on 03/16/21 *If you need a refill on your cardiac medications before your next appointment, please call your pharmacy*  Lab Work: Your physician recommends that you return for lab work TODAY: BMET  CBC TSH  If you have labs (blood work) drawn today and your tests are completely normal, you will receive your results only by: [employer] (if you have MyChart) OR A paper copy in the mail If you have any lab test that is abnormal or we need to change your treatment, we will call you to review the results.  Testing/Procedures: NONE ordered at this time of appointment   Follow-Up: At [facility], you and your health needs are our priority.  As part of our continuing mission to provide you with exceptional heart care, we have created designated Provider Care Teams.  These Care Teams include your primary Cardiologist (physician) and Advanced Practice Providers (APPs -  Physician Assistants and Nurse Practitioners) who all work together to provide you with the care you need, when you need it.   Your next appointment:   As scheduled    The format for your next appointment:   In Person  Provider:   Buford Dresser, MD  Other Instructions

## 2021-03-01 NOTE — Progress Notes (Signed)
Cardiology Office Note:    Date:  03/03/2021   ID:  Anne [name], DOB June 22, 1946, MRN [id number]  PCP:  Johna Roles, [facility] Providers Cardiologist:  Buford Dresser, MD     Referring MD: Johna Roles, [place]   Chief Complaint  Patient presents with   Follow-up    Seen for Dr. Harrell Gave, dizziness and hair loss    History of Present Illness:    Anne [name] is a 75 y.o. 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  Patient was admitted in July 2021 with NSTEMI.  Cardiac catheterization revealed high-grade proximal LAD lesion that was treated with DES.  Echocardiogram obtained on 03/16/2020 showed EF 55 to 60%, grade 1 DD, mild LAE.  She has chronic hypokalemia.  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  She was placed on DAPT and Aldactone.  Unfortunately patient has a history of noncompliance as well.  She returned back to the ED in July 2021 with complaint of infrascapular pain.  CT scan ruled out aortic aneurysm.  Her blood pressure was hypertensive in the 170s at the range.  She was last seen by Dr. Martinique in November 2021 at which time she was doing well.  She switched providers to Dr. Harrell Gave in December 2021.  Patient has upcoming colonoscopy on 04/13/2021, this was reviewed by Dr. Martinique who has cleared her to hold Plavix for 5 days prior to colonoscopy on 01/18/2021.  Patient presents today for cardiology evaluation.  Her primary concern is significant hair loss, which she says has been going on since her PCI last year.  She has been seen by multiple providers including dermatologist and PCP regarding hair loss, she attributed to hair loss to Plavix.  Even though eplerenone is listed as potential allergy that can cause hair loss, she adamantly denies this, she says it was not eplerenone that caused original hair loss and it was potassium supplement.  Either way, she can  come off of Plavix after July 8 since she would have completed 1 year course of Plavix therapy at that time.  I will check with Dr. Harrell Gave to see if she would recommend coming off of Plavix now we will continue for another 2 weeks.  Her second complaint is significant dizziness.  Her dizziness is not associated with orthostatic changes although she does mention that her dizziness is the worst about 15 minutes after she takes her morning medication.  She has been taken off of carvedilol recently by her PCP due to significant drop in the blood pressure.  I discussed with her potentially increase spironolactone, however she did not wish to do this as her blood pressure has gone up previously when the spironolactone was reduced.  Interestingly, she says she never had dizziness before when she was just on spironolactone itself.  I asked her to split the spironolactone to 25 mg twice a day.  We will also obtain CBC, TSH and a basic metabolic panel to rule out other potential causes for her dizzy spell.  If all for lab work come back okay and splitting the spironolactone does not make any change to her symptom, I would highly recommend referral to neurology service to evaluate her dizziness.  Otherwise, her last concern is significant muscular atrophy since last year.  Her weight is down quite significantly compared to last year and she is trying to eat more to gain weight.  Previous CTA of the  chest abdomen and pelvis obtained in July 2021 showed trace fluid within the pelvic cul-de-sac, however there was no mention of potential cancer to explain her unexplained weight loss.    Past Medical History:  Diagnosis Date   Complication of anesthesia    slow to wake up   Diverticulosis    Hiatal hernia    Hypercholesterolemia    Hypertension    Osteopenia    Vitamin D deficiency     Past Surgical History:  Procedure Laterality Date   ABDOMINAL HYSTERECTOMY     Partial   CORONARY STENT INTERVENTION N/A  03/16/2020   Procedure: CORONARY STENT INTERVENTION;  Surgeon: Burnell Blanks, MD;  Location: [facility];  Service: Cardiovascular;  Laterality: N/A;   fibrocystic breast excision     LAPAROSCOPIC ILEOCECECTOMY  07/07/2015   LAPAROTOMY N/A 07/07/2015   Procedure: EXPLORATORY LAPAROTOMY WITH ILEOCECECTOMY ;  Surgeon: Donnie Mesa, MD;  Location: [facility];  Service: General;  Laterality: N/A;   LEFT HEART CATH AND CORONARY ANGIOGRAPHY N/A 03/16/2020   Procedure: LEFT HEART CATH AND CORONARY ANGIOGRAPHY;  Surgeon: Burnell Blanks, MD;  Location: [facility];  Service: Cardiovascular;  Laterality: N/A;   TUBAL LIGATION      Current Medications: Current Meds  Medication Sig   aspirin 81 MG EC tablet TAKE 1 TABLET (81 MG TOTAL) BY MOUTH DAILY. SWALLOW WHOLE.   atorvastatin (LIPITOR) 80 MG tablet TAKE 1 TABLET BY MOUTH EVERY DAY   B Complex-C (B-COMPLEX WITH VITAMIN C) tablet Take 1 tablet by mouth daily.   clopidogrel (PLAVIX) 75 MG tablet TAKE 1 TABLET (75 MG TOTAL) BY MOUTH DAILY WITH BREAKFAST.   ezetimibe (ZETIA) 10 MG tablet Take 10 mg by mouth daily.   GARLIC PO Take 2 tablets by mouth daily.    Magnesium 500 MG TABS Take 500 tablets by mouth.   Multiple Vitamins-Minerals (MULTIVITAMIN WITH MINERALS) tablet Take 1 tablet by mouth daily.   spironolactone (ALDACTONE) 25 MG tablet Take 25 mg by mouth daily.   [DISCONTINUED] carvedilol (COREG) 3.125 MG tablet TAKE 1 TABLET (3.125 MG TOTAL) BY MOUTH 2 (TWO) TIMES DAILY WITH A MEAL.   [DISCONTINUED] spironolactone (ALDACTONE) 25 MG tablet Take 2 tablets (50 mg total) by mouth daily.     Allergies:   Amiloride; Anesthetics, amide; Other; Prilocaine; Amlodipine besylate; Asa [aspirin]; Diltiazem hcl; Eplerenone; Indomethacin; Naproxen; Tenex [guanfacine hcl]; Triamterene-hctz; Nebivolol hcl; Potassium chloride; Hytrin [terazosin hcl]; and Lisinopril   Social History   Socioeconomic History   Marital status: Married     Spouse name: Not on file   Number of children: 2   Years of education: Not on file   Highest education level: Not on file  Occupational History   Occupation: retired  Tobacco Use   Smoking status: Never   Smokeless tobacco: Never  Substance and Sexual Activity   Alcohol use: Yes    Comment: 3 times a year   Drug use: No   Sexual activity: Yes    Birth control/protection: Surgical  Other Topics Concern   Not on file  Social History Narrative   Not on file   Social Determinants of Health   Financial Resource Strain: Not on file  Food Insecurity: Not on file  Transportation Needs: Not on file  Physical Activity: Not on file  Stress: Not on file  Social Connections: Not on file     Family History: The patient's family history includes Hypertension in her mother; Stroke in her  brother and mother. There is no history of Cancer, Alcohol abuse, Early death, Hearing loss, Heart disease, Hyperlipidemia, or Kidney disease.  ROS:   Please see the history of present illness.     All other systems reviewed and are negative.  EKGs/Labs/Other Studies Reviewed:    The following studies were reviewed today:  Echo 03/16/2020 1. Left ventricular ejection fraction, by estimation, is 55 to 60%. The  left ventricle has normal function. The left ventricle has no regional  wall motion abnormalities. There is mild left ventricular hypertrophy.  Left ventricular diastolic parameters  are consistent with Grade I diastolic dysfunction (impaired relaxation).   2. Right ventricular systolic function is normal. The right ventricular  size is normal. There is normal pulmonary artery systolic pressure. The  estimated right ventricular systolic pressure is 32.2 mmHg.   3. Left atrial size was mildly dilated.   4. The mitral valve is normal in structure. No evidence of mitral valve  regurgitation. No evidence of mitral stenosis.   5. The aortic valve is tricuspid. Aortic valve regurgitation is not   visualized. No aortic stenosis is present.   6. The inferior vena cava is normal in size with greater than 50%  respiratory variability, suggesting right atrial pressure of 3 mmHg.   Cath 03/16/2020  Prox RCA lesion is 20% stenosed. Mid Cx to Dist Cx lesion is 20% stenosed. Prox LAD lesion is 95% stenosed. A drug-eluting stent was successfully placed using a SYNERGY XD 3.50X12. Post intervention, there is a 0% residual stenosis.   1. Severe stenosis proximal LAD 2. Successful PTCA/DES x 1 proximal LAD 3. Mild non-obstructive disease in the Circumflex and RCA   Recommendations: DAPT with ASA and Plavix for one year. Continue statin and beta blocker. Likely d/c home tomorrow.   EKG:  EKG is ordered today.  The ekg ordered today demonstrates normal sinus rhythm, low voltage.  Poor R wave progression in anterior leads.  Recent Labs: 03/15/2020: ALT 13 03/16/2020: Magnesium 2.0 03/01/2021: BUN 13; Creatinine, Ser 1.11; Hemoglobin 11.8; Platelets 203; Potassium 5.7; Sodium 140; TSH 1.960  Recent Lipid Panel    Component Value Date/Time   CHOL 195 03/16/2020 0517   TRIG 57 03/16/2020 0517   HDL 61 03/16/2020 0517   CHOLHDL 3.2 03/16/2020 0517   VLDL 11 03/16/2020 0517   LDLCALC 123 (H) 03/16/2020 0517   LDLDIRECT 158.7 09/30/2013 1255     Risk Assessment/Calculations:           Physical Exam:    VS:  Ht 5\' 5"  (1.651 m)   Wt 127 lb (57.6 kg)   SpO2 100%   BMI 21.13 kg/m     Wt Readings from Last 3 Encounters:  03/01/21 127 lb (57.6 kg)  07/20/20 136 lb (61.7 kg)  05/16/20 139 lb 8.8 oz (63.3 kg)     GEN:  Well nourished, well developed in no acute distress HEENT: Normal NECK: No JVD; No carotid bruits LYMPHATICS: No lymphadenopathy CARDIAC: RRR, no murmurs, rubs, gallops RESPIRATORY:  Clear to auscultation without rales, wheezing or rhonchi  ABDOMEN: Soft, non-tender, non-distended MUSCULOSKELETAL:  No edema; No deformity  SKIN: Warm and dry NEUROLOGIC:  Alert and  oriented x 3 PSYCHIATRIC:  Normal affect   ASSESSMENT:    1. Dizziness   2. Coronary artery disease involving native coronary artery of native heart without angina pectoris   3. Essential hypertension   4. Hyperlipidemia LDL goal <70   5. Hair loss    PLAN:  In order of problems listed above:  Dizziness: She says her dizziness may last for the entire day, however typically is the worst right after she takes her medication.  I recommend that she split her spironolactone to twice a day dosing to see if this may improve her symptom.  If dizziness does not resolve, may need referral to neurology service.  Orthostatic vital sign today was negative.  Her symptom does not occur with body position changes anyway.  Hair loss: She attributed her hair loss to Plavix therapy.  Although eplerenone is listed as a potential trigger for hair loss previously, she adamantly denies this.  Either way, she would have completed her 79-month course of Plavix after July 8.  After which time, I think she can stop her Plavix and continue aspirin monotherapy.  CAD: Underwent DES to proximal LAD last year.  See #2 regarding hair loss.  She has seen both her PCP and dermatologist were concerned the Plavix was the potential trigger behind hair loss.  Although I am unable to find potential correlation between Plavix and the hair loss based on initial online search.  Hypertension: Blood pressure stable on current therapy.  Recommend a split spironolactone to twice a day dosing given persistent dizziness.  Hyperlipidemia: Continue Zetia and Lipitor.        Medication Adjustments/Labs and Tests Ordered: Current medicines are reviewed at length with the patient today.  Concerns regarding medicines are outlined above.  Orders Placed This Encounter  Procedures   Basic metabolic panel   TSH   CBC   EKG 12-Lead   Meds ordered this encounter  Medications   nitroGLYCERIN (NITROSTAT) 0.4 MG SL tablet    Sig: Place  1 tablet (0.4 mg total) under the tongue every 5 (five) minutes as needed for chest pain. May repeat 2 times, if 3rd dose needed call 911    Dispense:  25 tablet    Refill:  3   DISCONTD: spironolactone (ALDACTONE) 25 MG tablet    Sig: Take 1 tablet (25 mg total) by mouth 2 (two) times daily.    Dispense:  180 tablet    Refill:  3   DISCONTD: spironolactone (ALDACTONE) 25 MG tablet    Sig: Take 1 tablet (25 mg total) by mouth 2 (two) times daily.    Dispense:  180 tablet    Refill:  3   spironolactone (ALDACTONE) 25 MG tablet    Sig: Take 1 tablet (25 mg total) by mouth 2 (two) times daily.    Dispense:  180 tablet    Refill:  3    Patient Instructions  Medication Instructions:  TAKE Spironolactone to 25 mg 2 times a day  STOP Plavix on 03/16/21 *If you need a refill on your cardiac medications before your next appointment, please call your pharmacy*  Lab Work: Your physician recommends that you return for lab work TODAY: BMET  CBC TSH  If you have labs (blood work) drawn today and your tests are completely normal, you will receive your results only by: [employer] (if you have MyChart) OR A paper copy in the mail If you have any lab test that is abnormal or we need to change your treatment, we will call you to review the results.  Testing/Procedures: NONE ordered at this time of appointment   Follow-Up: At [facility], you and your health needs are our priority.  As part of our continuing mission to provide you with exceptional heart care, we have created designated  Provider Care Teams.  These Care Teams include your primary Cardiologist (physician) and Advanced Practice Providers (APPs -  Physician Assistants and Nurse Practitioners) who all work together to provide you with the care you need, when you need it.   Your next appointment:   As scheduled    The format for your next appointment:   In Person  Provider:   Buford Dresser, MD  Other  Instructions   Signed, Almyra Deforest, [place]  03/03/2021 11:55 PM    [place]

## 2021-03-02 ENCOUNTER — Telehealth: Payer: Self-pay | Admitting: Physician Assistant

## 2021-03-02 ENCOUNTER — Other Ambulatory Visit: Payer: Self-pay

## 2021-03-02 ENCOUNTER — Telehealth: Payer: Self-pay

## 2021-03-02 DIAGNOSIS — Z79899 Other long term (current) drug therapy: Secondary | ICD-10-CM

## 2021-03-02 LAB — CBC
Hematocrit: 35.8 % (ref 34.0–46.6)
Hemoglobin: 11.8 g/dL (ref 11.1–15.9)
MCH: 28.9 pg (ref 26.6–33.0)
MCHC: 33 g/dL (ref 31.5–35.7)
MCV: 88 fL (ref 79–97)
Platelets: 203 10*3/uL (ref 150–450)
RBC: 4.09 x10E6/uL (ref 3.77–5.28)
RDW: 14 % (ref 11.7–15.4)
WBC: 4.4 10*3/uL (ref 3.4–10.8)

## 2021-03-02 LAB — BASIC METABOLIC PANEL
BUN/Creatinine Ratio: 12 (ref 12–28)
BUN: 13 mg/dL (ref 8–27)
CO2: 26 mmol/L (ref 20–29)
Calcium: 9.8 mg/dL (ref 8.7–10.3)
Chloride: 103 mmol/L (ref 96–106)
Creatinine, Ser: 1.11 mg/dL — ABNORMAL HIGH (ref 0.57–1.00)
Glucose: 89 mg/dL (ref 65–99)
Potassium: 5.7 mmol/L — ABNORMAL HIGH (ref 3.5–5.2)
Sodium: 140 mmol/L (ref 134–144)
eGFR: 52 mL/min/{1.73_m2} — ABNORMAL LOW (ref >59)

## 2021-03-02 LAB — TSH: TSH: 1.96 u[IU]/mL (ref 0.450–4.500)

## 2021-03-02 MED ORDER — SPIRONOLACTONE 25 MG PO TABS
25.0000 mg | ORAL_TABLET | Freq: Two times a day (BID) | ORAL | 3 refills | Status: DC
Start: 2021-03-02 — End: 2021-03-02

## 2021-03-02 MED ORDER — SPIRONOLACTONE 25 MG PO TABS
25.0000 mg | ORAL_TABLET | Freq: Two times a day (BID) | ORAL | 3 refills | Status: DC
Start: 2021-03-02 — End: 2021-03-27

## 2021-03-02 NOTE — Telephone Encounter (Signed)
*  STAT* If patient is at the pharmacy, call can be transferred to refill team.   1. Which medications need to be refilled? (please list name of each medication and dose if known) spironolactone (ALDACTONE) 25 MG tablet  2. Which pharmacy/location (including street and city if local pharmacy) is medication to be sent to? [id number] - [place], [place] - [address] AT [place]  3. Do they need a 30 day or 90 day supply? 90 day supply   Pt is out of medication

## 2021-03-02 NOTE — Telephone Encounter (Signed)
Spironolactone refill already sent to pharmacy.

## 2021-03-02 NOTE — Progress Notes (Signed)
Red blood cell normal. Thyroid ok. Renal function declined slightly and potassium is high, recommend cut back spironolactone to 20mg  daily taken every night instead. Repeat BMET in 1 weeks.

## 2021-03-02 NOTE — Telephone Encounter (Addendum)
Left voice message for patient to call office back for results, medication change and repeat labs. Will try calling again.  ----- Message from Almyra Deforest, [place] sent at 03/02/2021  8:21 AM EDT ----- Red blood cell normal. Thyroid ok. Renal function declined slightly and potassium is high, recommend cut back spironolactone to 20mg  daily taken every night instead. Repeat BMET in 1 weeks.

## 2021-03-03 ENCOUNTER — Encounter: Payer: Self-pay | Admitting: Physician Assistant

## 2021-03-06 ENCOUNTER — Ambulatory Visit: Payer: PPO

## 2021-03-07 ENCOUNTER — Other Ambulatory Visit: Payer: Self-pay

## 2021-03-07 DIAGNOSIS — Z79899 Other long term (current) drug therapy: Secondary | ICD-10-CM

## 2021-03-08 DIAGNOSIS — Z79899 Other long term (current) drug therapy: Secondary | ICD-10-CM | POA: Diagnosis not present

## 2021-03-08 LAB — BASIC METABOLIC PANEL
BUN/Creatinine Ratio: 12 (ref 12–28)
BUN: 12 mg/dL (ref 8–27)
CO2: 23 mmol/L (ref 20–29)
Calcium: 9.8 mg/dL (ref 8.7–10.3)
Chloride: 102 mmol/L (ref 96–106)
Creatinine, Ser: 1.02 mg/dL — ABNORMAL HIGH (ref 0.57–1.00)
Glucose: 87 mg/dL (ref 65–99)
Potassium: 4.6 mmol/L (ref 3.5–5.2)
Sodium: 140 mmol/L (ref 134–144)
eGFR: 58 mL/min/{1.73_m2} — ABNORMAL LOW (ref >59)

## 2021-03-09 ENCOUNTER — Other Ambulatory Visit: Payer: Self-pay | Admitting: Physician Assistant

## 2021-03-09 ENCOUNTER — Telehealth: Payer: Self-pay | Admitting: Cardiology

## 2021-03-09 DIAGNOSIS — K828 Other specified diseases of gallbladder: Secondary | ICD-10-CM | POA: Diagnosis not present

## 2021-03-09 DIAGNOSIS — N281 Cyst of kidney, acquired: Secondary | ICD-10-CM | POA: Diagnosis not present

## 2021-03-09 DIAGNOSIS — R10814 Left lower quadrant abdominal tenderness: Secondary | ICD-10-CM | POA: Diagnosis not present

## 2021-03-09 DIAGNOSIS — I700 Atherosclerosis of aorta: Secondary | ICD-10-CM | POA: Diagnosis not present

## 2021-03-09 DIAGNOSIS — R350 Frequency of micturition: Secondary | ICD-10-CM | POA: Diagnosis not present

## 2021-03-09 NOTE — Telephone Encounter (Signed)
I have called the patient. All questions answered. She went to see her PCP, pending CT of abdomen given LLQ pain to rule out diverticulitis.

## 2021-03-09 NOTE — Telephone Encounter (Signed)
I have called the patient twice and left my direct callback number. If I do not hear from her, I will try to call her again in 1 hour.

## 2021-03-09 NOTE — Progress Notes (Signed)
Stable renal function and electrolyte. Potassium has came down to normal range. I will call the patient myself to discuss result since she has some other questions based on today's triage note.

## 2021-03-09 NOTE — Telephone Encounter (Signed)
Patient wants to talk with Dr. Judeth Cornfield nurse ASAP

## 2021-03-09 NOTE — Telephone Encounter (Signed)
Called patient to discuss: patient reports having a bad night last night.  She reports being up all night urinating, she is unable to hold her urine.   She also reports pain in her LLQ, sore to touch.    She did not go to ER due to her bad experience last time.    She is wondering what her lab results are and if this could be causing her symptoms.    Advised preliminary labs improved but still need to be reviewed by PA.    Advised would defer urinary symptoms to PCP to rule out UTI.  Patient states "you are just a nurse, not a PA or doctor" and she would like to hear from her provider today.  "She should not have to wait the whole weekend to hear from her provider".      Advised PA not in office today but would send message to review.

## 2021-03-14 DIAGNOSIS — R10814 Left lower quadrant abdominal tenderness: Secondary | ICD-10-CM | POA: Diagnosis not present

## 2021-03-26 NOTE — Progress Notes (Signed)
Cardiology Office Note:    Date:  03/28/2021   ID:  Anne [name], DOB Oct 02, 1945, MRN [id number]  PCP:  Johna Roles, PA  Cardiologist:  Buford Dresser, MD  Referring MD: Johna Roles, [place]   CC: cardiology follow up/new patient to me  History of Present Illness:    Anne [name] is a 75 y.o. female with a hx of diverticulosis, hiatal hernia, hypercholesterolemia, hypertension, and osteopenia,  who is seen for cardiology follow up today. She was previously seen by Dr. [place] and is a new patient to me as of 03/27/21.  Today: This was initially noted as a preop visit, but she notes that she is only scheduled for colonoscopy and is clear now that she is no longer on clopidogrel. As this is a routine screening she was recommended to wait until she was one year past her MI. She is scheduled for her colonoscopy on 04/13/2021.  One of her major concerns was that while taking clopidogrel she developed pruritis and hair loss as side effects. She was also having balance issues and lightheadedness, which was severely limiting her daily activity and function. This was discontinued and she is feeling better. She is now able to walk around the house, cook, and do other daily activities. Typically she does not need to climb the stairs in her house, but she notes she would be able to if needed. Her lightheadedness has improved but is still present.  Currently she has no chest pain or shortness of breath. When she had her previous heart attack, she felt no chest pain. Instead, she had pain In the middle of her back as well as spiking blood pressure. Since then, she requires wearing bra extenders because she can not tolerate any tightness around her chest.  She endorses LE edema and sensitivity to salt. If she eats 1500 mg of sodium she will swell. When she goes out to eat, she tries to eat a salad.  Of note, she is using the restroom much more frequently and she is unsure why.  While  taking a previous statin she developed a side effect of severe muscle cramps.  She regularly checks her blood pressure at home. Yesterday she stayed up late, and asks if this affected her blood pressure in clinic today.  She denies any palpitations, or exertional symptoms. No headaches, or syncope to report. Also has no orthopnea or PND.   Past Medical History:  Diagnosis Date   Complication of anesthesia    slow to wake up   Diverticulosis    Hiatal hernia    Hypercholesterolemia    Hypertension    Osteopenia    Vitamin D deficiency     Past Surgical History:  Procedure Laterality Date   ABDOMINAL HYSTERECTOMY     Partial   CORONARY STENT INTERVENTION N/A 03/16/2020   Procedure: CORONARY STENT INTERVENTION;  Surgeon: Burnell Blanks, MD;  Location: [facility];  Service: Cardiovascular;  Laterality: N/A;   fibrocystic breast excision     LAPAROSCOPIC ILEOCECECTOMY  07/07/2015   LAPAROTOMY N/A 07/07/2015   Procedure: EXPLORATORY LAPAROTOMY WITH ILEOCECECTOMY ;  Surgeon: Donnie Mesa, MD;  Location: [facility];  Service: General;  Laterality: N/A;   LEFT HEART CATH AND CORONARY ANGIOGRAPHY N/A 03/16/2020   Procedure: LEFT HEART CATH AND CORONARY ANGIOGRAPHY;  Surgeon: Burnell Blanks, MD;  Location: [facility];  Service: Cardiovascular;  Laterality: N/A;   TUBAL LIGATION      Current Medications:  Current Outpatient Medications on File Prior to Visit  Medication Sig   aspirin 81 MG EC tablet TAKE 1 TABLET (81 MG TOTAL) BY MOUTH DAILY. SWALLOW WHOLE.   atorvastatin (LIPITOR) 40 MG tablet Take 40 mg by mouth daily.   B Complex-C (B-COMPLEX WITH VITAMIN C) tablet Take 1 tablet by mouth daily.   ezetimibe (ZETIA) 10 MG tablet Take 10 mg by mouth daily.   GARLIC PO Take 2 tablets by mouth daily.    Magnesium 500 MG TABS Take 500 tablets by mouth.   Multiple Vitamins-Minerals (MULTIVITAMIN WITH MINERALS) tablet Take 1 tablet by mouth daily.   nitroGLYCERIN  (NITROSTAT) 0.4 MG SL tablet Place 1 tablet (0.4 mg total) under the tongue every 5 (five) minutes as needed for chest pain. May repeat 2 times, if 3rd dose needed call 911   spironolactone (ALDACTONE) 25 MG tablet Take 25 mg by mouth daily.   No current facility-administered medications on file prior to visit.     Allergies:   Amiloride; Anesthetics, amide; Other; Prilocaine; Amlodipine besylate; Asa [aspirin]; Diltiazem hcl; Eplerenone; Indomethacin; Naproxen; Tenex [guanfacine hcl]; Triamterene-hctz; Nebivolol hcl; Potassium chloride; Hytrin [terazosin hcl]; and Lisinopril   Social History   Tobacco Use   Smoking status: Never   Smokeless tobacco: Never  Substance Use Topics   Alcohol use: Yes    Comment: 3 times a year   Drug use: No    Family History: family history includes Hypertension in her mother; Stroke in her brother and mother. There is no history of Cancer, Alcohol abuse, Early death, Hearing loss, Heart disease, Hyperlipidemia, or Kidney disease.  ROS:   Please see the history of present illness.  Additional pertinent ROS: Constitutional: Negative for chills, fever, night sweats, unintentional weight loss  HENT: Positive for lightheadedness. Negative for ear pain and hearing loss.   Eyes: Negative for loss of vision and eye pain.  Respiratory: Negative for cough, sputum, wheezing.   Cardiovascular: See HPI. Gastrointestinal: Negative for abdominal pain, melena, and hematochezia.  Genitourinary: Positive for frequency. Negative for dysuria and hematuria.  Musculoskeletal: Positive for LE edema. Negative for falls and myalgias.  Skin: Negative for itching and rash.  Neurological: Negative for focal weakness, focal sensory changes and loss of consciousness.  Endo/Heme/Allergies: Does not bruise/bleed easily.     EKGs/Labs/Other Studies Reviewed:    The following studies were reviewed today:  LHC 03/16/2020: Prox RCA lesion is 20% stenosed. Mid Cx to Dist Cx lesion  is 20% stenosed. Prox LAD lesion is 95% stenosed. A drug-eluting stent was successfully placed using a SYNERGY XD 3.50X12. Post intervention, there is a 0% residual stenosis.   1. Severe stenosis proximal LAD 2. Successful PTCA/DES x 1 proximal LAD 3. Mild non-obstructive disease in the Circumflex and RCA   Recommendations: DAPT with ASA and Plavix for one year. Continue statin and beta blocker. Likely d/c home tomorrow.  Echo 03/16/2020:  1. Left ventricular ejection fraction, by estimation, is 55 to 60%. The  left ventricle has normal function. The left ventricle has no regional  wall motion abnormalities. There is mild left ventricular hypertrophy.  Left ventricular diastolic parameters  are consistent with Grade I diastolic dysfunction (impaired relaxation).   2. Right ventricular systolic function is normal. The right ventricular  size is normal. There is normal pulmonary artery systolic pressure. The  estimated right ventricular systolic pressure is 32.3 mmHg.   3. Left atrial size was mildly dilated.   4. The mitral valve is normal in structure.  No evidence of mitral valve  regurgitation. No evidence of mitral stenosis.   5. The aortic valve is tricuspid. Aortic valve regurgitation is not  visualized. No aortic stenosis is present.   6. The inferior vena cava is normal in size with greater than 50%  respiratory variability, suggesting right atrial pressure of 3 mmHg.  EKG:  EKG is personally reviewed.   03/27/2021: none ordered today 03/01/21: NSR 77 bpm  Recent Labs: 03/01/2021: Hemoglobin 11.8; Platelets 203; TSH 1.960 03/08/2021: BUN 12; Creatinine, Ser 1.02; Potassium 4.6; Sodium 140  Recent Lipid Panel    Component Value Date/Time   CHOL 195 03/16/2020 0517   TRIG 57 03/16/2020 0517   HDL 61 03/16/2020 0517   CHOLHDL 3.2 03/16/2020 0517   VLDL 11 03/16/2020 0517   LDLCALC 123 (H) 03/16/2020 0517   LDLDIRECT 158.7 09/30/2013 1255    Physical Exam:    VS:  BP (!)  142/86 (BP Location: Left Arm, Patient Position: Sitting)   Pulse 82   Ht 5\' 5"  (1.651 m)   Wt 129 lb 3.2 oz (58.6 kg)   SpO2 97%   BMI 21.50 kg/m     Wt Readings from Last 3 Encounters:  03/27/21 129 lb 3.2 oz (58.6 kg)  03/01/21 127 lb (57.6 kg)  07/20/20 136 lb (61.7 kg)    GEN: Well nourished, well developed in no acute distress HEENT: Normal, moist mucous membranes NECK: No JVD CARDIAC: regular rhythm, normal S1 and S2, no rubs or gallops. No murmur. VASCULAR: Radial and DP pulses 2+ bilaterally. No carotid bruits RESPIRATORY:  Clear to auscultation without rales, wheezing or rhonchi  ABDOMEN: Soft, non-tender, non-distended MUSCULOSKELETAL:  Ambulates independently SKIN: Warm and dry, no edema NEUROLOGIC:  Alert and oriented x 3. No focal neuro deficits noted. PSYCHIATRIC:  Normal affect    ASSESSMENT:    1. Coronary artery disease involving native coronary artery of native heart without angina pectoris   2. Medication management   3. History of non-ST elevation myocardial infarction (NSTEMI)   4. Hyperlipidemia LDL goal <70   5. Essential hypertension   6. Hypertension due to endocrine disorder   7. Hyperaldosteronism (HCC)    PLAN:    CAD, without current angina History of NSTEMI 03/2020: -on aspirin, had mild side effects on clopidogrel that have resolved since completing 1 year of DAPT -no angina -reviewed red flag warning signs that need immediate medical attention.  Hypercholesterolemia -LDL goal <70.  -she was having some side effects on atorvastatin 80 mg dose. Improved since dropping to 40 mg dose. On ezetimibe -last LDL on statin was 128 (prior to the dropped dose). We discussed PCSK9i therapy if she cannot get to goal on maximum tolerated therapy. 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 Per Isaac Laud Meng's last note, he recommended BID dosing (notes split, but written as 25 mg BID). She reports taking only  daily. Monitor. -not at goal today, reports that it is usually better. Goal <130/80. Has multiple medical intolerances, will need to be cautious as she also has a history of lightheadedness  Cardiac risk counseling and prevention recommendations: -recommend heart healthy/Mediterranean diet, with whole grains, fruits, vegetable, fish, lean meats, nuts, and olive oil. Limit salt. -recommend moderate walking, 3-5 times/week for 30-50 minutes each session. Aim for at least 150 minutes.week. Goal should be pace of 3 miles/hours, or walking 1.5 miles in 30 minutes -recommend avoidance of tobacco products. Avoid excess alcohol. -ASCVD risk score: The ASCVD Risk score (  Renaye Rakers., et al., 2013) failed to calculate for the following reasons:   The patient has a prior MI or stroke diagnosis    Plan for follow up: 6 months or sooner as needed.  Buford Dresser, MD, PhD, [facility]    Medication Adjustments/Labs and Tests Ordered: Current medicines are reviewed at length with the patient today.  Concerns regarding medicines are outlined above.  Orders Placed This Encounter  Procedures   Lipid panel   No orders of the defined types were placed in this encounter.   Patient Instructions  Medication Instructions:  Your Physician recommend you continue on your current medication as directed.    *If you need a refill on your cardiac medications before your next appointment, please call your pharmacy*   Lab Work: Dr. Harrell Gave recommends lab work at our [facility] ([address], [place] [address], no lab appointment needed)  -Fasting lipid    Testing/Procedures: None ordered today   Follow-Up: At [facility], you and your health needs are our priority.  As part of our continuing mission to provide you with exceptional heart care, we have created designated Provider Care Teams.  These Care Teams include your primary Cardiologist (physician) and  Advanced Practice Providers (APPs -  Physician Assistants and Nurse Practitioners) who all work together to provide you with the care you need, when you need it.  We recommend signing up for the patient portal called "MyChart".  Sign up information is provided on this After Visit Summary.  MyChart is used to connect with patients for Virtual Visits (Telemedicine).  Patients are able to view lab/test results, encounter notes, upcoming appointments, etc.  Non-urgent messages can be sent to your provider as well.   To learn more about what you can do with MyChart, go to [email or web address].    Your next appointment:   6 month(s)  The format for your next appointment:   In Person  Provider:   Buford Dresser, MD    [facility] Stumpf,acting as a scribe for Buford Dresser, MD.,have documented all relevant documentation on the behalf of Buford Dresser, MD,as directed by  Buford Dresser, MD while in the presence of Buford Dresser, MD.  I, Buford Dresser, MD, have reviewed all documentation for this visit. The documentation on 03/28/21 for the exam, diagnosis, procedures, and orders are all accurate and complete.   Signed, Buford Dresser, MD PhD 03/28/2021 8:34 AM    [place]

## 2021-03-27 ENCOUNTER — Ambulatory Visit (HOSPITAL_BASED_OUTPATIENT_CLINIC_OR_DEPARTMENT_OTHER): Payer: PPO | Admitting: Cardiology

## 2021-03-27 ENCOUNTER — Other Ambulatory Visit: Payer: Self-pay

## 2021-03-27 VITALS — BP 142/86 | HR 82 | Ht 65.0 in | Wt 129.2 lb

## 2021-03-27 DIAGNOSIS — I2510 Atherosclerotic heart disease of native coronary artery without angina pectoris: Secondary | ICD-10-CM

## 2021-03-27 DIAGNOSIS — E269 Hyperaldosteronism, unspecified: Secondary | ICD-10-CM

## 2021-03-27 DIAGNOSIS — E785 Hyperlipidemia, unspecified: Secondary | ICD-10-CM | POA: Diagnosis not present

## 2021-03-27 DIAGNOSIS — Z79899 Other long term (current) drug therapy: Secondary | ICD-10-CM

## 2021-03-27 DIAGNOSIS — I252 Old myocardial infarction: Secondary | ICD-10-CM

## 2021-03-27 DIAGNOSIS — I10 Essential (primary) hypertension: Secondary | ICD-10-CM | POA: Diagnosis not present

## 2021-03-27 DIAGNOSIS — I152 Hypertension secondary to endocrine disorders: Secondary | ICD-10-CM | POA: Diagnosis not present

## 2021-03-27 NOTE — Patient Instructions (Signed)
Medication Instructions:  Your Physician recommend you continue on your current medication as directed.    *If you need a refill on your cardiac medications before your next appointment, please call your pharmacy*   Lab Work: Dr. Harrell Gave recommends lab work at our [facility] ([address] [place] [address], no lab appointment needed)  -Fasting lipid    Testing/Procedures: None ordered today   Follow-Up: At [facility], you and your health needs are our priority.  As part of our continuing mission to provide you with exceptional heart care, we have created designated Provider Care Teams.  These Care Teams include your primary Cardiologist (physician) and Advanced Practice Providers (APPs -  Physician Assistants and Nurse Practitioners) who all work together to provide you with the care you need, when you need it.  We recommend signing up for the patient portal called "MyChart".  Sign up information is provided on this After Visit Summary.  MyChart is used to connect with patients for Virtual Visits (Telemedicine).  Patients are able to view lab/test results, encounter notes, upcoming appointments, etc.  Non-urgent messages can be sent to your provider as well.   To learn more about what you can do with MyChart, go to [email or web address].    Your next appointment:   6 month(s)  The format for your next appointment:   In Person  Provider:   Buford Dresser, MD

## 2021-03-28 ENCOUNTER — Encounter (HOSPITAL_BASED_OUTPATIENT_CLINIC_OR_DEPARTMENT_OTHER): Payer: Self-pay | Admitting: Cardiology

## 2021-03-28 DIAGNOSIS — I252 Old myocardial infarction: Secondary | ICD-10-CM | POA: Insufficient documentation

## 2021-03-28 DIAGNOSIS — I2510 Atherosclerotic heart disease of native coronary artery without angina pectoris: Secondary | ICD-10-CM | POA: Insufficient documentation

## 2021-03-28 DIAGNOSIS — Z79899 Other long term (current) drug therapy: Secondary | ICD-10-CM | POA: Diagnosis not present

## 2021-03-28 DIAGNOSIS — E785 Hyperlipidemia, unspecified: Secondary | ICD-10-CM | POA: Insufficient documentation

## 2021-03-28 LAB — LIPID PANEL
Chol/HDL Ratio: 2.6 ratio (ref 0.0–4.4)
Cholesterol, Total: 206 mg/dL — ABNORMAL HIGH (ref 100–199)
HDL: 78 mg/dL (ref >39)
LDL Chol Calc (NIH): 118 mg/dL — ABNORMAL HIGH (ref 0–99)
Triglycerides: 53 mg/dL (ref 0–149)
VLDL Cholesterol Cal: 10 mg/dL (ref 5–40)

## 2021-04-03 DIAGNOSIS — H2513 Age-related nuclear cataract, bilateral: Secondary | ICD-10-CM | POA: Diagnosis not present

## 2021-04-03 DIAGNOSIS — H25013 Cortical age-related cataract, bilateral: Secondary | ICD-10-CM | POA: Diagnosis not present

## 2021-04-03 DIAGNOSIS — H43813 Vitreous degeneration, bilateral: Secondary | ICD-10-CM | POA: Diagnosis not present

## 2021-04-03 DIAGNOSIS — H524 Presbyopia: Secondary | ICD-10-CM | POA: Diagnosis not present

## 2021-04-13 DIAGNOSIS — Z1211 Encounter for screening for malignant neoplasm of colon: Secondary | ICD-10-CM | POA: Diagnosis not present

## 2021-04-13 DIAGNOSIS — K5730 Diverticulosis of large intestine without perforation or abscess without bleeding: Secondary | ICD-10-CM | POA: Diagnosis not present

## 2021-04-13 DIAGNOSIS — Z980 Intestinal bypass and anastomosis status: Secondary | ICD-10-CM | POA: Diagnosis not present

## 2021-04-25 ENCOUNTER — Encounter: Payer: Self-pay | Admitting: Cardiology

## 2021-05-04 ENCOUNTER — Encounter (HOSPITAL_BASED_OUTPATIENT_CLINIC_OR_DEPARTMENT_OTHER): Payer: Self-pay

## 2021-05-07 NOTE — Progress Notes (Signed)
Patient ID: Anne [name]                 DOB: 1946-08-25                    MRN: [id number]     HPI: Anne [name] is a 75 y.o. female patient referred to lipid clinic by Dr. Harrell Gave. PMH is significant for NSTEMI 03/2020 s/p DES to proximal LAD, HLD, HTN. LVEF at time of NSTEMI was 55-60%. LDL elevated at last visit with Dr. Harrell Gave in July to 118 mg/dL, recommended to start PCSK9i therapy.   Today, patient arrives in good spirits but frustrated that she was not given the name of the medication that may be added for her cholesterol at today's visit as she likes to do her own research before starting a medication. When she tried to find the injectable medication online she found Leqvio. She read the clinical trial and found that it was studied in a very small percentage of Black or female patients and she does not feel comfortable starting a medication that has not been studied in patients like her, which is very understandable. Discussed that if needed for additional LDL lowering, we would start a PCSK9i rather than Leqvio. However, upon review of refill records and calling [facility] to confirm, patient has not filled atorvastatin since December 2021. She is filling ezetimibe regularly. She states she is taking atorvastatin and ezetimibe daily. She adheres to a very healthy diet but is not currently exercising regularly.   Current Medications: atorvastatin '40mg'$  daily, ezetimibe '10mg'$  daily Intolerances: atorvastatin '80mg'$  (severe myalgias) Risk Factors: ASCVD, HTN, HLD LDL goal: <70 mg/dL  Diet: adheres to a low cholesterol, low salt diet  Exercise: not currently very active  Family History: Hypertension in her mother; Stroke in her brother and mother  Social History: Never smoker  Labs: 03/28/21: TC 206, TG 53, HDL 78, LDL 118 (ezetimibe '10mg'$  daily) 03/16/20: TC 195, TG 57, HDL 61, LDL 123 (no medications)  Past Medical History:  Diagnosis Date   Complication of anesthesia    slow to  wake up   Diverticulosis    Hiatal hernia    Hypercholesterolemia    Hypertension    Osteopenia    Vitamin D deficiency     Current Outpatient Medications on File Prior to Visit  Medication Sig Dispense Refill   aspirin 81 MG EC tablet TAKE 1 TABLET (81 MG TOTAL) BY MOUTH DAILY. SWALLOW WHOLE. 90 tablet 3   atorvastatin (LIPITOR) 40 MG tablet Take 40 mg by mouth daily.     B Complex-C (B-COMPLEX WITH VITAMIN C) tablet Take 1 tablet by mouth daily.     ezetimibe (ZETIA) 10 MG tablet Take 10 mg by mouth daily.     GARLIC PO Take 2 tablets by mouth daily.      Magnesium 500 MG TABS Take 500 tablets by mouth.     Multiple Vitamins-Minerals (MULTIVITAMIN WITH MINERALS) tablet Take 1 tablet by mouth daily.     nitroGLYCERIN (NITROSTAT) 0.4 MG SL tablet Place 1 tablet (0.4 mg total) under the tongue every 5 (five) minutes as needed for chest pain. May repeat 2 times, if 3rd dose needed call 911 25 tablet 3   spironolactone (ALDACTONE) 25 MG tablet Take 25 mg by mouth daily.     No current facility-administered medications on file prior to visit.    Allergies  Allergen Reactions   Amiloride Other (See Comments)    Hair  loss   Anesthetics, Amide Other (See Comments)    Lethargic and very slow reanimation lasting a full day or so   Other Other (See Comments)    Very hard to awake from anaesthetic with surgery. Acute renal failure, hypokalemia Very hard to awake from anaesthetic with surgery.   Prilocaine Other (See Comments)    Lethargic and very slow reanimation lasting a full day or so   Amlodipine Besylate Swelling    Severe edema / Headaches   Asa [Aspirin] Other (See Comments)    Causes stomach issues   Diltiazem Hcl Nausea And Vomiting    Hospitalized when it occurred    Eplerenone Other (See Comments)    Hair loss   Indomethacin Other (See Comments)    esophagitis   Naproxen Other (See Comments)    Gastric intolerance   Tenex [Guanfacine Hcl] Other (See Comments)     Chronic insomnia    Triamterene-Hctz Other (See Comments)    Acute renal failure, hypokalemia    Nebivolol Hcl     Other reaction(s): disoriented   Potassium Chloride Other (See Comments)    Severe hair loss, baldness   Hytrin [Terazosin Hcl] Other (See Comments)    Pt states she can not take this med but can not relate any side effect or incident that occurred   Lisinopril Other (See Comments)    Unknown, pt says she "can't take it", cough/hypotension and thirst    Assessment/Plan:  1. Hyperlipidemia - Most recent LDL of 118 not at goal <70 mg/dL. This has changed little since lipid panel from 03/2020, LDL of 123, when she was not on any cholesterol lowering medication. [facility] is [facility] but has not filled atorvastatin since December 2021. Patient confirms this is the only pharmacy she fills at. Discussed that based on unchanged lab results and conversation with [facility], it does not appear she is taking atorvastatin as she thought. Rather than start a PCSK9i now, will have her take atorvastatin 40 mg daily and ezetimibe 10 mg daily for the next 12 weeks, then recheck lipid panel to see if LDL has improved once we know she has been adherent to both medications for this time period. At that time, if LDL is still not at goal, will revisit PCSK9i discussion. Provided patient with the name of this medication (Repatha), which her insurance covers for $40 per 30 day supply or $80 for 90 day supply via mail order (requires [place], $0 deductible), so that she can research this on her own before deciding to start it. Also showed her how this medication worked and explained how it is stored. She was appreciative for all of this information. I also encouraged her to start exercising, such as walking daily, and continue the good with work her diet. Follow up labs are scheduled for November 21st to check lipids and LFTs. Will call patient with these results and discuss if any additional  medications are needed at that time.   [name], PharmD PGY2 Ambulatory Care Pharmacy Resident 05/08/2021 11:13 AM

## 2021-05-08 ENCOUNTER — Other Ambulatory Visit: Payer: Self-pay

## 2021-05-08 ENCOUNTER — Ambulatory Visit (INDEPENDENT_AMBULATORY_CARE_PROVIDER_SITE_OTHER): Payer: PPO | Admitting: Student-PharmD

## 2021-05-08 DIAGNOSIS — E785 Hyperlipidemia, unspecified: Secondary | ICD-10-CM

## 2021-05-08 MED ORDER — ATORVASTATIN CALCIUM 40 MG PO TABS: 40.0000 mg | ORAL_TABLET | Freq: Every day | ORAL | 3 refills | Status: DC

## 2021-05-08 NOTE — Patient Instructions (Addendum)
Nice to see you today!  Keep up the good work with diet and exercise. Aim for a diet full of vegetables, fruit and lean meats (chicken, [place], fish). Try to limit carbs (bread, pasta, sugar, rice) and red meat consumption.  Your goal LDL is less than 70 mg/dL, you're currently at 118 mg/dL  Medication Changes: Continue atorvastatin '40mg'$  and ezetimibe '10mg'$  daily  Make sure you get both of these medications from your pharmacy and take them both every day. In 12 weeks we will check your cholesterol again on November 21st. I will call you when those result and we will decide what to do next. The name of the injectable medication we talked about today is Repatha, which is an injection given every 14 days.   Please give [place] a call at [phone] with any questions or concerns.

## 2021-05-10 DIAGNOSIS — Z0000 Encounter for general adult medical examination without abnormal findings: Secondary | ICD-10-CM | POA: Diagnosis not present

## 2021-05-10 DIAGNOSIS — K5730 Diverticulosis of large intestine without perforation or abscess without bleeding: Secondary | ICD-10-CM | POA: Diagnosis not present

## 2021-05-10 DIAGNOSIS — Z980 Intestinal bypass and anastomosis status: Secondary | ICD-10-CM | POA: Diagnosis not present

## 2021-05-10 DIAGNOSIS — I10 Essential (primary) hypertension: Secondary | ICD-10-CM | POA: Diagnosis not present

## 2021-05-10 DIAGNOSIS — E2689 Other hyperaldosteronism: Secondary | ICD-10-CM | POA: Diagnosis not present

## 2021-05-10 DIAGNOSIS — M8580 Other specified disorders of bone density and structure, unspecified site: Secondary | ICD-10-CM | POA: Diagnosis not present

## 2021-05-10 DIAGNOSIS — E7800 Pure hypercholesterolemia, unspecified: Secondary | ICD-10-CM | POA: Diagnosis not present

## 2021-05-10 DIAGNOSIS — I252 Old myocardial infarction: Secondary | ICD-10-CM | POA: Diagnosis not present

## 2021-05-10 DIAGNOSIS — Z780 Asymptomatic menopausal state: Secondary | ICD-10-CM | POA: Diagnosis not present

## 2021-05-10 DIAGNOSIS — K862 Cyst of pancreas: Secondary | ICD-10-CM | POA: Diagnosis not present

## 2021-05-11 ENCOUNTER — Other Ambulatory Visit: Payer: Self-pay | Admitting: Physician Assistant

## 2021-05-11 DIAGNOSIS — R200 Anesthesia of skin: Secondary | ICD-10-CM | POA: Diagnosis not present

## 2021-05-11 DIAGNOSIS — N3941 Urge incontinence: Secondary | ICD-10-CM | POA: Diagnosis not present

## 2021-05-11 DIAGNOSIS — K862 Cyst of pancreas: Secondary | ICD-10-CM

## 2021-05-11 DIAGNOSIS — G44219 Episodic tension-type headache, not intractable: Secondary | ICD-10-CM | POA: Diagnosis not present

## 2021-05-15 ENCOUNTER — Other Ambulatory Visit: Payer: Self-pay | Admitting: Physician Assistant

## 2021-05-15 DIAGNOSIS — M8580 Other specified disorders of bone density and structure, unspecified site: Secondary | ICD-10-CM

## 2021-05-15 DIAGNOSIS — Z780 Asymptomatic menopausal state: Secondary | ICD-10-CM

## 2021-05-18 ENCOUNTER — Other Ambulatory Visit: Payer: Self-pay | Admitting: Physician Assistant

## 2021-05-18 DIAGNOSIS — Z01818 Encounter for other preprocedural examination: Secondary | ICD-10-CM | POA: Diagnosis not present

## 2021-05-18 DIAGNOSIS — M8580 Other specified disorders of bone density and structure, unspecified site: Secondary | ICD-10-CM

## 2021-05-18 DIAGNOSIS — H2512 Age-related nuclear cataract, left eye: Secondary | ICD-10-CM | POA: Diagnosis not present

## 2021-05-18 DIAGNOSIS — H2511 Age-related nuclear cataract, right eye: Secondary | ICD-10-CM | POA: Diagnosis not present

## 2021-05-18 DIAGNOSIS — Z780 Asymptomatic menopausal state: Secondary | ICD-10-CM

## 2021-05-28 DIAGNOSIS — H2512 Age-related nuclear cataract, left eye: Secondary | ICD-10-CM | POA: Diagnosis not present

## 2021-06-06 ENCOUNTER — Other Ambulatory Visit: Payer: Self-pay | Admitting: Cardiology

## 2021-06-06 DIAGNOSIS — I10 Essential (primary) hypertension: Secondary | ICD-10-CM | POA: Diagnosis not present

## 2021-06-06 DIAGNOSIS — M542 Cervicalgia: Secondary | ICD-10-CM | POA: Diagnosis not present

## 2021-06-06 DIAGNOSIS — R350 Frequency of micturition: Secondary | ICD-10-CM | POA: Diagnosis not present

## 2021-06-11 DIAGNOSIS — H2511 Age-related nuclear cataract, right eye: Secondary | ICD-10-CM | POA: Diagnosis not present

## 2021-06-21 ENCOUNTER — Inpatient Hospital Stay: Admission: RE | Admit: 2021-06-21 | Payer: PPO | Source: Ambulatory Visit

## 2021-06-27 ENCOUNTER — Other Ambulatory Visit (HOSPITAL_BASED_OUTPATIENT_CLINIC_OR_DEPARTMENT_OTHER): Payer: Self-pay

## 2021-06-27 ENCOUNTER — Ambulatory Visit: Payer: PPO | Attending: Internal Medicine

## 2021-06-27 DIAGNOSIS — Z23 Encounter for immunization: Secondary | ICD-10-CM

## 2021-06-27 MED ORDER — PFIZER COVID-19 VAC BIVALENT 30 MCG/0.3ML IM SUSP
INTRAMUSCULAR | 0 refills | Status: DC
  Filled 2021-06-27: qty 0.3, 1d supply, fill #0

## 2021-06-27 NOTE — Progress Notes (Signed)
   Covid-19 Vaccination Clinic  Name:  BRETTE CAST    MRN: [id number] DOB: August 30, 1946  06/27/2021  Ms. Halliday was observed post Covid-19 immunization for 15 minutes without incident. She was provided with Vaccine Information Sheet and instruction to access the V-Safe system.   Ms. Fuertes was instructed to call 911 with any severe reactions post vaccine: Difficulty breathing  Swelling of face and throat  A fast heartbeat  A bad rash all over body  Dizziness and weakness

## 2021-07-30 ENCOUNTER — Other Ambulatory Visit: Payer: PPO

## 2021-07-31 ENCOUNTER — Telehealth: Payer: Self-pay | Admitting: Student-PharmD

## 2021-07-31 NOTE — Telephone Encounter (Signed)
Pt missed lab appt yesterday to check lipids. Called patient to reschedule but was unable to reach, LVM requesting call back.

## 2021-08-07 DIAGNOSIS — Z20828 Contact with and (suspected) exposure to other viral communicable diseases: Secondary | ICD-10-CM | POA: Diagnosis not present

## 2021-08-08 DIAGNOSIS — R030 Elevated blood-pressure reading, without diagnosis of hypertension: Secondary | ICD-10-CM | POA: Diagnosis not present

## 2021-08-08 DIAGNOSIS — R42 Dizziness and giddiness: Secondary | ICD-10-CM | POA: Diagnosis not present

## 2021-08-08 DIAGNOSIS — H6692 Otitis media, unspecified, left ear: Secondary | ICD-10-CM | POA: Diagnosis not present

## 2021-08-08 DIAGNOSIS — I10 Essential (primary) hypertension: Secondary | ICD-10-CM | POA: Diagnosis not present

## 2021-08-11 ENCOUNTER — Emergency Department (HOSPITAL_COMMUNITY)
Admission: EM | Admit: 2021-08-11 | Discharge: 2021-08-11 | Disposition: A | Discharge status: Home / Self Care | Payer: PPO | Attending: Emergency Medicine | Admitting: Emergency Medicine

## 2021-08-11 ENCOUNTER — Other Ambulatory Visit: Payer: Self-pay

## 2021-08-11 ENCOUNTER — Emergency Department (HOSPITAL_COMMUNITY): Payer: PPO

## 2021-08-11 ENCOUNTER — Encounter (HOSPITAL_COMMUNITY): Payer: Self-pay | Admitting: Emergency Medicine

## 2021-08-11 DIAGNOSIS — M62838 Other muscle spasm: Secondary | ICD-10-CM | POA: Diagnosis not present

## 2021-08-11 DIAGNOSIS — M79604 Pain in right leg: Secondary | ICD-10-CM | POA: Diagnosis present

## 2021-08-11 DIAGNOSIS — I2510 Atherosclerotic heart disease of native coronary artery without angina pectoris: Secondary | ICD-10-CM | POA: Diagnosis not present

## 2021-08-11 DIAGNOSIS — Z7982 Long term (current) use of aspirin: Secondary | ICD-10-CM | POA: Insufficient documentation

## 2021-08-11 DIAGNOSIS — M791 Myalgia, unspecified site: Secondary | ICD-10-CM

## 2021-08-11 DIAGNOSIS — I10 Essential (primary) hypertension: Secondary | ICD-10-CM | POA: Diagnosis not present

## 2021-08-11 DIAGNOSIS — M545 Low back pain, unspecified: Secondary | ICD-10-CM | POA: Diagnosis not present

## 2021-08-11 DIAGNOSIS — Z20822 Contact with and (suspected) exposure to covid-19: Secondary | ICD-10-CM | POA: Diagnosis not present

## 2021-08-11 LAB — CBC WITH DIFFERENTIAL/PLATELET
Abs Immature Granulocytes: 0.01 10*3/uL (ref 0.00–0.07)
Basophils Absolute: 0 10*3/uL (ref 0.0–0.1)
Basophils Relative: 0 %
Eosinophils Absolute: 0 10*3/uL (ref 0.0–0.5)
Eosinophils Relative: 1 %
HCT: 35.7 % — ABNORMAL LOW (ref 36.0–46.0)
Hemoglobin: 12 g/dL (ref 12.0–15.0)
Immature Granulocytes: 0 %
Lymphocytes Relative: 27 %
Lymphs Abs: 1.4 10*3/uL (ref 0.7–4.0)
MCH: 29.1 pg (ref 26.0–34.0)
MCHC: 33.6 g/dL (ref 30.0–36.0)
MCV: 86.7 fL (ref 80.0–100.0)
Monocytes Absolute: 0.3 10*3/uL (ref 0.1–1.0)
Monocytes Relative: 6 %
Neutro Abs: 3.5 10*3/uL (ref 1.7–7.7)
Neutrophils Relative %: 66 %
Platelets: 197 10*3/uL (ref 150–400)
RBC: 4.12 MIL/uL (ref 3.87–5.11)
RDW: 13.6 % (ref 11.5–15.5)
WBC: 5.3 10*3/uL (ref 4.0–10.5)
nRBC: 0 % (ref 0.0–0.2)

## 2021-08-11 LAB — URINALYSIS, ROUTINE W REFLEX MICROSCOPIC
Bilirubin Urine: NEGATIVE
Glucose, UA: NEGATIVE mg/dL
Hgb urine dipstick: NEGATIVE
Ketones, ur: NEGATIVE mg/dL
Leukocytes,Ua: NEGATIVE
Nitrite: NEGATIVE
Protein, ur: NEGATIVE mg/dL
Specific Gravity, Urine: 1.003 — ABNORMAL LOW (ref 1.005–1.030)
pH: 7 (ref 5.0–8.0)

## 2021-08-11 LAB — MAGNESIUM: Magnesium: 2 mg/dL (ref 1.7–2.4)

## 2021-08-11 LAB — BASIC METABOLIC PANEL
Anion gap: 6 (ref 5–15)
BUN: 11 mg/dL (ref 8–23)
CO2: 26 mmol/L (ref 22–32)
Calcium: 9.4 mg/dL (ref 8.9–10.3)
Chloride: 105 mmol/L (ref 98–111)
Creatinine, Ser: 1 mg/dL (ref 0.44–1.00)
GFR, Estimated: 59 mL/min — ABNORMAL LOW (ref >60)
Glucose, Bld: 104 mg/dL — ABNORMAL HIGH (ref 70–99)
Potassium: 3.9 mmol/L (ref 3.5–5.1)
Sodium: 137 mmol/L (ref 135–145)

## 2021-08-11 LAB — RESP PANEL BY RT-PCR (FLU A&B, COVID) ARPGX2
Influenza A by PCR: NEGATIVE
Influenza B by PCR: NEGATIVE
SARS Coronavirus 2 by RT PCR: NEGATIVE

## 2021-08-11 LAB — CK: Total CK: 78 U/L (ref 38–234)

## 2021-08-11 MED ORDER — TRAMADOL HCL 50 MG PO TABS: 50.0000 mg | ORAL_TABLET | Freq: Four times a day (QID) | ORAL | 0 refills | Status: DC | PRN

## 2021-08-11 MED ORDER — CEFDINIR 300 MG PO CAPS: 300.0000 mg | ORAL_CAPSULE | Freq: Two times a day (BID) | ORAL | 0 refills | Status: AC

## 2021-08-11 NOTE — Discharge Instructions (Signed)
Your history, exam, work-up today are concerning for muscle cramps and spasm causing all of your discomfort.  Your labs did not show any critical electrolyte abnormalities and your x-ray did not show evidence of any acute fracture or dislocation of spine and the low back.  Your ear still did not show evidence of otitis media or ear infection and so I spoke with pharmacy who recommended switching to different antibiotic given the spasms and discomfort starting after the other antibiotic.  Please stop taking it.  Please use the pain medicine help with discomfort and follow-up with your primary doctor.  If any symptoms change or worsen acutely, please return to the nearest emergency department.

## 2021-08-11 NOTE — ED Triage Notes (Signed)
Pt started on antibiotic on Thursday for ear infection.  Reports lower back pain, bilateral leg pain, and headache x 3 days.

## 2021-08-11 NOTE — ED Provider Notes (Signed)
Emergency Medicine Provider Triage Evaluation Note  Anne [name] , a 75 y.o. female  was evaluated in triage.  Pt complains of bilateral leg pain 3 days worsening last night.  She reports that she was started on antibiotic on Thursday (Augmentin) prior to the onset of her symptoms.  Patient reports that she has had difficulty ambulating secondary to pain.  She has associated lower back pain.  She has not tried any medications at home for his symptoms.  She denies chest pain, shortness of breath, abdominal pain, nausea, vomiting, dysuria, hematuria, flank pain.   Review of Systems  Positive: Lower back pain, bilateral leg pain/cramping Negative: Chest pain shortness of breath  Physical Exam  BP (!) 147/85 (BP Location: Right Arm)   Pulse 82   Temp 99.5 F (37.5 C)   Resp 18   SpO2 100%  Gen:   Awake, no distress   Resp:  Normal effort  MSK:   Decreased range of motion of bilateral lower extremities secondary to pain.  Sensation intact bilaterally to lower extremities.  No C, T, L, S spinal tenderness to palpation.  No CVA tenderness noted bilaterally.  Tenderness to palpation to bilateral lumbar musculature.  No overlying skin changes.   Medical Decision Making  Medically screening exam initiated at 3:03 PM.  Appropriate orders placed.  Nivea ABRAHAM MARGULIES was informed that the remainder of the evaluation will be completed by another provider, this initial triage assessment does not replace that evaluation, and the importance of remaining in the ED until their evaluation is complete.   Jareb Radoncic A, PA-C 08/11/21 1514    Carmin Muskrat, MD 08/11/21 [id number]

## 2021-08-11 NOTE — ED Provider Notes (Signed)
[facility] EMERGENCY DEPARTMENT Provider Note   CSN: [id number] Arrival date & time: 08/11/21  1412     History No chief complaint on file.   Anne [name] is a 75 y.o. female.  The history is provided by the patient and medical records. No language interpreter was used.  Leg Pain Location:  Leg Time since incident:  2 days Injury: no   Leg location:  R upper leg and L upper leg Pain details:    Quality:  Cramping and aching   Radiates to:  Back   Severity:  Severe   Onset quality:  Gradual   Duration:  2 days   Timing:  Constant   Progression:  Waxing and waning Chronicity:  New Tetanus status:  Unknown Prior injury to area:  No Relieved by:  Nothing Exacerbated by: augmentin. Ineffective treatments:  None tried Associated symptoms: back pain   Associated symptoms: no fatigue, no fever and no neck pain       Past Medical History:  Diagnosis Date   Complication of anesthesia    slow to wake up   Diverticulosis    Hiatal hernia    Hypercholesterolemia    Hypertension    Osteopenia    Vitamin D deficiency     Patient Active Problem List   Diagnosis Date Noted   Coronary artery disease involving native coronary artery of native heart without angina pectoris 03/28/2021   History of non-ST elevation myocardial infarction (NSTEMI) 03/28/2021   Hyperlipidemia LDL goal <70 03/28/2021   NSTEMI (non-ST elevated myocardial infarction) ([place]) 03/15/2020   ACS (acute coronary syndrome) ([place])    Vertigo 12/21/2019   Weight loss, non-intentional 12/21/2019   Hypertensive urgency 12/21/2019   Renal insufficiency 12/21/2019   Pancreatic cyst 12/21/2019   History of diabetes mellitus 12/21/2019   Small bowel obstruction ([place]) 07/07/2015   SBO (small bowel obstruction) ([place]) 07/07/2015   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   Back pain 10/21/2014   Routine general medical examination at a health care facility 07/12/2014   Hearing loss  09/27/2012   Other screening mammogram 09/27/2012   Pure hypercholesterolemia 09/25/2012   Hypokalemia 08/31/2012   HTN (hypertension) 10/01/2011    Past Surgical History:  Procedure Laterality Date   ABDOMINAL HYSTERECTOMY     Partial   CORONARY STENT INTERVENTION N/A 03/16/2020   Procedure: CORONARY STENT INTERVENTION;  Surgeon: Burnell Blanks, MD;  Location: [facility];  Service: Cardiovascular;  Laterality: N/A;   fibrocystic breast excision     LAPAROSCOPIC ILEOCECECTOMY  07/07/2015   LAPAROTOMY N/A 07/07/2015   Procedure: EXPLORATORY LAPAROTOMY WITH ILEOCECECTOMY ;  Surgeon: Donnie Mesa, MD;  Location: [facility];  Service: General;  Laterality: N/A;   LEFT HEART CATH AND CORONARY ANGIOGRAPHY N/A 03/16/2020   Procedure: LEFT HEART CATH AND CORONARY ANGIOGRAPHY;  Surgeon: Burnell Blanks, MD;  Location: [facility];  Service: Cardiovascular;  Laterality: N/A;   TUBAL LIGATION       OB History   No obstetric history on file.     Family History  Problem Relation Age of Onset   Stroke Mother    Hypertension Mother    Stroke Brother    Cancer Neg Hx    Alcohol abuse Neg Hx    Early death Neg Hx    Hearing loss Neg Hx    Heart disease Neg Hx    Hyperlipidemia Neg Hx    Kidney disease Neg Hx  Social History   Tobacco Use   Smoking status: Never   Smokeless tobacco: Never  Substance Use Topics   Alcohol use: Yes    Comment: 3 times a year   Drug use: No    Home Medications Prior to Admission medications   Medication Sig Start Date End Date Taking? Authorizing Provider  aspirin (ASPIRIN LOW DOSE) 81 MG EC tablet TAKE 1 TABLET (81 MG TOTAL) BY MOUTH DAILY. SWALLOW WHOLE. 06/06/21   Buford Dresser, MD  atorvastatin (LIPITOR) 40 MG tablet Take 1 tablet (40 mg total) by mouth daily. 05/08/21   Buford Dresser, MD  B Complex-C (B-COMPLEX WITH VITAMIN C) tablet Take 1 tablet by mouth daily.    [provider]  COVID-19  mRNA bivalent vaccine, Pfizer, (PFIZER COVID-19 VAC BIVALENT) injection Inject into the muscle. 06/27/21   Carlyle Basques, MD  ezetimibe (ZETIA) 10 MG tablet Take 10 mg by mouth daily.    [provider]  GARLIC PO Take 2 tablets by mouth daily.     [provider]  Magnesium 500 MG TABS Take 500 tablets by mouth.    [provider]  Multiple Vitamins-Minerals (MULTIVITAMIN WITH MINERALS) tablet Take 1 tablet by mouth daily.    [provider]  nitroGLYCERIN (NITROSTAT) 0.4 MG SL tablet Place 1 tablet (0.4 mg total) under the tongue every 5 (five) minutes as needed for chest pain. May repeat 2 times, if 3rd dose needed call 911 03/01/21 05/30/21  Almyra Deforest, [place]  spironolactone (ALDACTONE) 25 MG tablet Take 25 mg by mouth daily.    [provider]    Allergies    Amiloride; Anesthetics, amide; Other; Prilocaine; Amlodipine besylate; Asa [aspirin]; Diltiazem hcl; Eplerenone; Indomethacin; Naproxen; Tenex [guanfacine hcl]; Triamterene-hctz; Nebivolol hcl; Potassium chloride; Hytrin [terazosin hcl]; and Lisinopril  Review of Systems   Review of Systems  Constitutional:  Negative for chills, diaphoresis, fatigue and fever.  HENT:  Positive for congestion and ear pain.   Eyes:  Negative for visual disturbance.  Respiratory:  Negative for cough, chest tightness, shortness of breath and wheezing.   Cardiovascular:  Negative for chest pain, palpitations and leg swelling.  Gastrointestinal:  Negative for abdominal pain, constipation, diarrhea, nausea and vomiting.  Genitourinary:  Negative for dysuria and flank pain.  Musculoskeletal:  Positive for back pain and myalgias. Negative for neck pain and neck stiffness.  Skin:  Negative for rash and wound.  Neurological:  Positive for headaches (mild at top of head). Negative for tremors, seizures, facial asymmetry, speech difficulty, weakness, light-headedness and numbness.  Psychiatric/Behavioral:  Negative for  agitation and confusion.   All other systems reviewed and are negative.  Physical Exam Updated Vital Signs BP (!) 147/85 (BP Location: Right Arm)   Pulse 82   Temp 99.5 F (37.5 C)   Resp 18   SpO2 100%   Physical Exam Vitals and nursing note reviewed.  Constitutional:      General: She is not in acute distress.    Appearance: She is well-developed. She is not ill-appearing, toxic-appearing or diaphoretic.  HENT:     Head: Normocephalic and atraumatic.     Left Ear: No drainage or tenderness. Tympanic membrane is erythematous and bulging. Tympanic membrane is not perforated.  Eyes:     Conjunctiva/sclera: Conjunctivae normal.  Cardiovascular:     Rate and Rhythm: Normal rate and regular rhythm.     Heart sounds: No murmur heard. Pulmonary:     Effort: Pulmonary effort is normal. No respiratory  distress.     Breath sounds: Normal breath sounds.  Abdominal:     Palpations: Abdomen is soft.     Tenderness: There is no abdominal tenderness.  Musculoskeletal:        General: No swelling.     Cervical back: Neck supple.       Legs:     Comments: Tenderness and soreness in the hamstrings and quadriceps area as well as the low back.  No bony or midline tenderness present.  No focal neurologic deficits.  Abdomen nontender.  Skin:    General: Skin is warm and dry.     Capillary Refill: Capillary refill takes less than 2 seconds.  Neurological:     Mental Status: She is alert.  Psychiatric:        Mood and Affect: Mood normal.    ED Results / Procedures / Treatments   Labs (all labs ordered are listed, but only abnormal results are displayed) Labs Reviewed  BASIC METABOLIC PANEL - Abnormal; Notable for the following components:      Result Value   Glucose, Bld 104 (*)    GFR, Estimated 59 (*)    All other components within normal limits  CBC WITH DIFFERENTIAL/PLATELET - Abnormal; Notable for the following components:   HCT 35.7 (*)    All other components within normal  limits  URINALYSIS, ROUTINE W REFLEX MICROSCOPIC - Abnormal; Notable for the following components:   Color, Urine COLORLESS (*)    Specific Gravity, Urine 1.003 (*)    All other components within normal limits  RESP PANEL BY RT-PCR (FLU A&B, COVID) ARPGX2  MAGNESIUM  CK    EKG None  Radiology DG Lumbar Spine Complete  Result Date: 08/11/2021 CLINICAL DATA:  Low back pain radiating to legs EXAM: LUMBAR SPINE - COMPLETE 4+ VIEW COMPARISON:  None. FINDINGS: There is no evidence of lumbar spine fracture. Gentle levoscoliosis, apex L2-L3. Minimal disc space height loss and endplate osteophytosis. Facets are intact. Nonobstructive pattern of overlying bowel gas. Moderate burden of stool throughout the colon. IMPRESSION: 1.  No fracture or dislocation of the lumbar spine. 2.  Gentle levoscoliosis, apex L2-L3. 3. Minimal disc space height loss and endplate osteophytosis. Facets are intact. Lumbar disc and neural foraminal pathology may be further evaluated by MRI if indicated by neurologically localizing signs and symptoms. 4.  Moderate burden of stool throughout the colon. Electronically Signed   By: Delanna Ahmadi M.D.   On: 08/11/2021 16:06    Procedures Procedures   Medications Ordered in ED Medications - No data to display  ED Course  I have reviewed the triage vital signs and the nursing notes.  Pertinent labs & imaging results that were available during my care of the patient were reviewed by me and considered in my medical decision making (see chart for details).    MDM Rules/Calculators/A&P                           Anne [name] is a 75 y.o. female with past medical history significant for hypertension, previous Bell's fraction, diabetes, CAD with previous NSTEMI and PCI, hyperlipidemia, diverticulosis, osteopenia, hiatal hernia, and recent diagnosis of otitis media on Augmentin for the last few days who presents with pain in bilateral legs and low back.  According to patient,  she has had spasms and soreness and cramping pain in both the anterior and posterior aspects of her proximal legs and thighs and low back.  She denies any fevers or chills but does report having some other muscle soreness.  She reports some mild headache.  She reports some congestion and URI symptoms.  She denies any current nausea, vomiting, constipation, diarrhea, or urinary changes.  She is primarily concerned about the pains in the legs.  She denies any history of allergic reactions like this before but does state she has had some intolerances and allergies to medications in the past.  Denies history of DVT or PE.  Denies urinary symptoms.  On exam, lungs clear and chest nontender.  Abdomen nontender.  Back has some mild paraspinal tenderness in the low back where she has some muscle spasm palpated.  Patient does have tenderness in the muscle bodies of her thighs and back of her upper legs.  No tenderness or abnormalities with no numbness or tingling or weakness in lower extremities.  Good pulses in extremities.  Anterior abdomen nontender.  Patient otherwise well-appearing.  Clinically I suspect patient has either a viral infection causing diffuse myalgias versus reaction to medications.  Patient had screening labs in triage which were overall reassuring.  We will add on a CK due to the muscle soreness and tenderness to look for rhabdo.  Although patient reports she has had a recent negative COVID and flu test, we will likely reorder as her temperature was 99.5.  If work-up is reassuring, anticipate discharge with prescription for muscle medications and possible Lidoderm patches.  Anticipate reassessment after work-up  Patient CK was not elevated and COVID and flu test were negative.  Suspect muscle spasm and muscle soreness potentially related to medication use.  She says that the muscle soreness did worsen after she took her last dose of Augmentin.  At her request, we will switch to different  antibiotic.  I spoke to pharmacy who recommended cefdinir and will also give a prescription for some Ultram which patient reports is worked for her in the past.  She does not want any Lidoderm as she has had reactions to this type medicines in the past.  Patient follow-up PCP and understood return precautions.  She no other questions or concerns and was discharged in good condition.   Final Clinical Impression(s) / ED Diagnoses Final diagnoses:  Muscle pain  Muscle spasm    Rx / DC Orders ED Discharge Orders          Ordered    cefdinir (OMNICEF) 300 MG capsule  2 times daily        08/11/21 2044    traMADol (ULTRAM) 50 MG tablet  Every 6 hours PRN        08/11/21 2044           Clinical Impression: 1. Muscle pain   2. Muscle spasm     Disposition: Discharge  Condition: Good  I have discussed the results, Dx and Tx plan with the pt(& family if present). He/she/they expressed understanding and agree(s) with the plan. Discharge instructions discussed at great length. Strict return precautions discussed and pt &/or family have verbalized understanding of the instructions. No further questions at time of discharge.    Discharge Medication List as of 08/11/2021  8:46 PM     START taking these medications   Details  cefdinir (OMNICEF) 300 MG capsule Take 1 capsule (300 mg total) by mouth 2 (two) times daily for 7 days., Starting Sat 08/11/2021, Until Sat 08/18/2021, Normal    traMADol (ULTRAM) 50 MG tablet Take 1 tablet (50 mg total) by mouth every  6 (six) hours as needed., Starting Sat 08/11/2021, Normal        Follow Up: Johna Roles, PA [address] [place] [address] [phone]     [facility] [address] [id number] mc [place]       Darria Corvera, Gwenyth Allegra, MD 08/11/21 [phone]

## 2021-08-14 NOTE — Telephone Encounter (Signed)
Third attempt to contact patient to reschedule lab work. LVM each time requesting call back.   At last visit with lipid clinic on 05/08/21, she was found to be unknowingly nonadherent to atorvastatin but was taking ezetimibe regularly. We resumed atorvastatin 40 mg daily and continued ezetimibe with plan to recheck lipids before adding PCSK9i. Please check fasting lipid panel and LFTs at next visit to see if LDL is at goal <70 mg/dL.

## 2021-09-17 DIAGNOSIS — H66002 Acute suppurative otitis media without spontaneous rupture of ear drum, left ear: Secondary | ICD-10-CM | POA: Diagnosis not present

## 2021-09-17 DIAGNOSIS — H9193 Unspecified hearing loss, bilateral: Secondary | ICD-10-CM | POA: Diagnosis not present

## 2021-09-17 DIAGNOSIS — H9313 Tinnitus, bilateral: Secondary | ICD-10-CM | POA: Diagnosis not present

## 2021-09-20 DIAGNOSIS — H93A3 Pulsatile tinnitus, bilateral: Secondary | ICD-10-CM | POA: Insufficient documentation

## 2021-09-20 DIAGNOSIS — H6121 Impacted cerumen, right ear: Secondary | ICD-10-CM | POA: Insufficient documentation

## 2021-09-20 DIAGNOSIS — H903 Sensorineural hearing loss, bilateral: Secondary | ICD-10-CM | POA: Diagnosis not present

## 2021-09-20 DIAGNOSIS — J343 Hypertrophy of nasal turbinates: Secondary | ICD-10-CM | POA: Diagnosis not present

## 2021-10-02 ENCOUNTER — Other Ambulatory Visit: Payer: Self-pay | Admitting: Physician Assistant

## 2021-10-02 DIAGNOSIS — M8580 Other specified disorders of bone density and structure, unspecified site: Secondary | ICD-10-CM

## 2021-10-02 DIAGNOSIS — Z780 Asymptomatic menopausal state: Secondary | ICD-10-CM

## 2021-10-03 ENCOUNTER — Ambulatory Visit
Admission: RE | Admit: 2021-10-03 | Discharge: 2021-10-03 | Disposition: A | Discharge status: Home / Self Care | Payer: PPO | Source: Ambulatory Visit | Attending: Physician Assistant | Admitting: Physician Assistant

## 2021-10-03 ENCOUNTER — Other Ambulatory Visit: Payer: Self-pay

## 2021-10-03 DIAGNOSIS — K862 Cyst of pancreas: Secondary | ICD-10-CM | POA: Diagnosis not present

## 2021-10-03 MED ORDER — GADOBENATE DIMEGLUMINE 529 MG/ML IV SOLN
11.0000 mL | Freq: Once | INTRAVENOUS | Status: AC | PRN
  Administered 2021-10-03: 11 mL via INTRAVENOUS

## 2021-10-24 ENCOUNTER — Encounter (HOSPITAL_BASED_OUTPATIENT_CLINIC_OR_DEPARTMENT_OTHER): Payer: Self-pay | Admitting: Cardiology

## 2021-10-24 ENCOUNTER — Other Ambulatory Visit: Payer: Self-pay

## 2021-10-24 ENCOUNTER — Ambulatory Visit (HOSPITAL_BASED_OUTPATIENT_CLINIC_OR_DEPARTMENT_OTHER): Payer: PPO | Admitting: Cardiology

## 2021-10-24 VITALS — BP 148/70 | HR 96 | Ht 64.75 in | Wt 130.0 lb

## 2021-10-24 DIAGNOSIS — E785 Hyperlipidemia, unspecified: Secondary | ICD-10-CM

## 2021-10-24 DIAGNOSIS — I10 Essential (primary) hypertension: Secondary | ICD-10-CM | POA: Diagnosis not present

## 2021-10-24 DIAGNOSIS — I2510 Atherosclerotic heart disease of native coronary artery without angina pectoris: Secondary | ICD-10-CM

## 2021-10-24 DIAGNOSIS — E269 Hyperaldosteronism, unspecified: Secondary | ICD-10-CM | POA: Diagnosis not present

## 2021-10-24 DIAGNOSIS — I252 Old myocardial infarction: Secondary | ICD-10-CM

## 2021-10-24 NOTE — Patient Instructions (Signed)
Medication Instructions:  Your Physician recommend you continue on your current medication as directed.    *If you need a refill on your cardiac medications before your next appointment, please call your pharmacy*   Lab Work: None ordered today   Testing/Procedures: None ordered today    Follow-Up: At [facility], you and your health needs are our priority.  As part of our continuing mission to provide you with exceptional heart care, we have created designated Provider Care Teams.  These Care Teams include your primary Cardiologist (physician) and Advanced Practice Providers (APPs -  Physician Assistants and Nurse Practitioners) who all work together to provide you with the care you need, when you need it.  We recommend signing up for the patient portal called "MyChart".  Sign up information is provided on this After Visit Summary.  MyChart is used to connect with patients for Virtual Visits (Telemedicine).  Patients are able to view lab/test results, encounter notes, upcoming appointments, etc.  Non-urgent messages can be sent to your provider as well.   To learn more about what you can do with MyChart, go to [email or web address].    Your next appointment:   6 month(s)  The format for your next appointment:   In Person  Provider:   Buford Dresser, MD{  Other Instructions Recent data, summarized from [place] from a study from the [facility]:  https://wilkins.info/  "Researchers at the [facility] set out to answer this question by comparing statins to supplements in a clinical trial. They tracked the outcomes of 190 adults, ages 19 to 43. Some participants were given a 5 mg daily dose of rosuvastatin, a statin that is sold under the brand name Crestor for 28 days. Others were given supplements, including fish oil, cinnamon, garlic, turmeric,  plant sterols or red yeast rice for the same period."  "What we found was that rosuvastatin lowered LDL cholesterol by almost 38% and that was vastly superior to placebo and any of the six supplements studied in the trial," study Despina Hidden, M.D. of the [facility], [place] told NPR. He says this level of reduction is enough to lower the risk of heart attacks and strokes. The findings are published in the Journal of the [employer] of Cardiology.  "Oftentimes these supplements are marketed as 'natural ways' to lower your cholesterol," says Laffin. But he says none of the dietary supplements demonstrated any significant decrease in LDL cholesterol compared with a placebo. LDL cholesterol is considered the 'bad cholesterol' because it can contribute to plaque build-up in the artery walls - which can narrow the arteries, and set the stage for heart attacks and strokes"

## 2021-10-24 NOTE — Progress Notes (Signed)
Cardiology Office Note:    Date:  10/26/2021   ID:  Anne [name], DOB 05/14/1946, MRN [id number]  PCP:  Johna Roles, PA  Cardiologist:  Buford Dresser, MD  Referring MD: Johna Roles, [place]   CC: follow up  History of Present Illness:    Anne [name] is a 76 y.o. female with a hx of diverticulosis, hiatal hernia, hypercholesterolemia, hypertension, and osteopenia who is seen for cardiology follow up today. She was previously seen by Dr. Martinique, and my initial visit with her was 03/27/21.  Today: Overall, she is doing well. She is wearing a neck brace because she hurt herself while working around the house. Her hair is growing back. She also regained her hearing 3 weeks ago. She had an acute sinus infection that spread to her chest. She developed an ear ache that caused her to present to the ER. She experienced hearing loss in her R then L ear. She followed-up with an otolaryngologist. She also had cataract surgery. She was given a prednisone. While on prednisone, her blood pressure increased suddenly and she was switched. Of note, she has sensitivities to several medications. She tolerated Doxycycline and tramadol.   She reports her blood pressure will be normal when she is not in pain or sick. On average, her blood pressure would be 126/70. She reports atorvastatin caused pain from her waist down. She describes the pain as the worst pain she has every felt. She has been trying natural alternatives, such as garlic, for her blood pressure and pain.   Occasionally, she has numbness in her L hand and a headache that frightens her. She endorses weakness in her L hand compared to her R hand.   For diet, she avoids trans- and saturated fats and added sodium. She has been gaining weight through better food choices and feeling better. She is a member of the [employer] and [employer] and hopes to start weight lifting. She also walks around the park.   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   Denies chest pain, shortness of breath at rest or with normal exertion. No PND, orthopnea, LE edema or unexpected weight gain. No syncope or palpitations.  Past Medical History:  Diagnosis Date   Complication of anesthesia    slow to wake up   Diverticulosis    Hiatal hernia    Hypercholesterolemia    Hypertension    Osteopenia    Vitamin D deficiency     Past Surgical History:  Procedure Laterality Date   ABDOMINAL HYSTERECTOMY     Partial   CORONARY STENT INTERVENTION N/A 03/16/2020   Procedure: CORONARY STENT INTERVENTION;  Surgeon: Burnell Blanks, MD;  Location: [facility];  Service: Cardiovascular;  Laterality: N/A;   fibrocystic breast excision     LAPAROSCOPIC ILEOCECECTOMY  07/07/2015   LAPAROTOMY N/A 07/07/2015   Procedure: EXPLORATORY LAPAROTOMY WITH ILEOCECECTOMY ;  Surgeon: Donnie Mesa, MD;  Location: [facility];  Service: General;  Laterality: N/A;   LEFT HEART CATH AND CORONARY ANGIOGRAPHY N/A 03/16/2020   Procedure: LEFT HEART CATH AND CORONARY ANGIOGRAPHY;  Surgeon: Burnell Blanks, MD;  Location: [facility];  Service: Cardiovascular;  Laterality: N/A;   TUBAL LIGATION      Current Medications: Current Outpatient Medications on File Prior to Visit  Medication Sig   aspirin (ASPIRIN LOW DOSE) 81 MG EC tablet TAKE 1 TABLET (81 MG TOTAL) BY  MOUTH DAILY. SWALLOW WHOLE.   B Complex-C (B-COMPLEX WITH VITAMIN C) tablet Take 1 tablet by mouth daily.   COVID-19 mRNA bivalent vaccine, Pfizer, (PFIZER COVID-19 VAC BIVALENT) injection Inject into the muscle.   GARLIC PO Take 2 tablets by mouth daily.    Magnesium 500 MG TABS Take 500 tablets by mouth.   Multiple Vitamins-Minerals (MULTIVITAMIN WITH MINERALS) tablet Take 1 tablet by mouth daily.   nitroGLYCERIN (NITROSTAT) 0.4 MG SL tablet Place 1 tablet (0.4 mg total) under the tongue every 5 (five) minutes as needed for chest  pain. May repeat 2 times, if 3rd dose needed call 911   spironolactone (ALDACTONE) 25 MG tablet Take 25 mg by mouth daily.   traMADol (ULTRAM) 50 MG tablet Take 1 tablet (50 mg total) by mouth every 6 (six) hours as needed.   No current facility-administered medications on file prior to visit.     Allergies:   Amiloride; Anesthetics, amide; Other; Prilocaine; Amlodipine besylate; Asa [aspirin]; Augmentin [amoxicillin-pot clavulanate]; Cefdinir; Diltiazem hcl; Eplerenone; Indomethacin; Naproxen; Tenex [guanfacine hcl]; Triamterene-hctz; Nebivolol hcl; Potassium chloride; Hytrin [terazosin hcl]; and Lisinopril   Social History   Tobacco Use   Smoking status: Never   Smokeless tobacco: Never  Substance Use Topics   Alcohol use: Yes    Comment: 3 times a year   Drug use: No    Family History: family history includes Hypertension in her mother; Stroke in her brother and mother. There is no history of Cancer, Alcohol abuse, Early death, Hearing loss, Heart disease, Hyperlipidemia, or Kidney disease.  ROS:   Please see the history of present illness.  Additional pertinent ROS: (+) Neck pain (+) Myalgias (+) L hand numbness/weakness (+) Headaches  EKGs/Labs/Other Studies Reviewed:    The following studies were reviewed today: LHC 2020-04-06: Prox RCA lesion is 20% stenosed. Mid Cx to Dist Cx lesion is 20% stenosed. Prox LAD lesion is 95% stenosed. A drug-eluting stent was successfully placed using a SYNERGY XD 3.50X12. Post intervention, there is a 0% residual stenosis.   1. Severe stenosis proximal LAD 2. Successful PTCA/DES x 1 proximal LAD 3. Mild non-obstructive disease in the Circumflex and RCA   Recommendations: DAPT with ASA and Plavix for one year. Continue statin and beta blocker. Likely d/c home tomorrow.  Echo 04-06-20:  1. Left ventricular ejection fraction, by estimation, is 55 to 60%. The  left ventricle has normal function. The left ventricle has no regional   wall motion abnormalities. There is mild left ventricular hypertrophy.  Left ventricular diastolic parameters  are consistent with Grade I diastolic dysfunction (impaired relaxation).   2. Right ventricular systolic function is normal. The right ventricular  size is normal. There is normal pulmonary artery systolic pressure. The  estimated right ventricular systolic pressure is 64.3 mmHg.   3. Left atrial size was mildly dilated.   4. The mitral valve is normal in structure. No evidence of mitral valve  regurgitation. No evidence of mitral stenosis.   5. The aortic valve is tricuspid. Aortic valve regurgitation is not  visualized. No aortic stenosis is present.   6. The inferior vena cava is normal in size with greater than 50%  respiratory variability, suggesting right atrial pressure of 3 mmHg.  EKG:  EKG was not ordered today 03/27/2021: none ordered today 03/01/21: NSR 77 bpm  Recent Labs: 03/01/2021: TSH 1.960 08/11/2021: BUN 11; Creatinine, Ser 1.00; Hemoglobin 12.0; Magnesium 2.0; Platelets 197; Potassium 3.9; Sodium 137  Recent Lipid Panel    Component  Value Date/Time   CHOL 206 (H) 03/28/2021 0844   TRIG 53 03/28/2021 0844   HDL 78 03/28/2021 0844   CHOLHDL 2.6 03/28/2021 0844   CHOLHDL 3.2 03/16/2020 0517   VLDL 11 03/16/2020 0517   LDLCALC 118 (H) 03/28/2021 0844   LDLDIRECT 158.7 09/30/2013 1255    Physical Exam:    VS:  BP (!) 148/70    Pulse 96    Ht 5' 4.75" (1.645 m)    Wt 130 lb (59 kg)    BMI 21.80 kg/m     Wt Readings from Last 3 Encounters:  10/24/21 130 lb (59 kg)  03/27/21 129 lb 3.2 oz (58.6 kg)  03/01/21 127 lb (57.6 kg)    GEN: Well nourished, well developed in no acute distress HEENT: Normal, moist mucous membranes NECK: No JVD CARDIAC: regular rhythm, normal S1 and S2, no rubs or gallops. No murmur. VASCULAR: Radial and DP pulses 2+ bilaterally. No carotid bruits RESPIRATORY:  Clear to auscultation without rales, wheezing or rhonchi  ABDOMEN:  Soft, non-tender, non-distended MUSCULOSKELETAL:  Ambulates independently, wearing neck brace SKIN: Warm and dry, no edema NEUROLOGIC:  Alert and oriented x 3. No focal neuro deficits noted. PSYCHIATRIC:  Normal affect    ASSESSMENT:    1. Hyperlipidemia LDL goal <70   2. Coronary artery disease involving native coronary artery of native heart without angina pectoris   3. History of non-ST elevation myocardial infarction (NSTEMI)   4. Essential hypertension   5. Hyperaldosteronism (HCC)     PLAN:    CAD, without current angina History of NSTEMI 03/2020: -on aspirin, had mild side effects on clopidogrel that have resolved since completing 1 year of DAPT -no angina -reviewed red flag warning signs that need immediate medical attention.  Hypercholesterolemia -LDL goal <70.  -she did not tolerate atorvastatin or ezetimibe, feeling that it affected her strength -we discussed statin alternatives at length today. Discussed that she is high risk given her known CAD and prior NSTEMI. We spent significant time discussing PCSK9i and methods of delivery. 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 She reports that this is the dose that she best tolerates. -not at goal today, reports that it is usually better. Goal <130/80. Has multiple medical intolerances, will need to be cautious as she also has a history of lightheadedness. We discussed potential changes, she declines today. She will contact me if blood pressure remains elevated  Cardiac risk counseling and prevention recommendations: -recommend heart healthy/Mediterranean diet, with whole grains, fruits, vegetable, fish, lean meats, nuts, and olive oil. Limit salt. -recommend moderate walking, 3-5 times/week for 30-50 minutes each session. Aim for at least 150 minutes.week. Goal should be pace  of 3 miles/hours, or walking 1.5 miles in 30 minutes -recommend avoidance of tobacco products. Avoid excess alcohol. -ASCVD risk score: The ASCVD Risk score (Arnett DK, et al., 2019) failed to calculate for the following reasons:   The patient has a prior MI or stroke diagnosis    Plan for follow up: 6 months  Buford Dresser, MD, PhD, [facility]    Medication Adjustments/Labs and Tests Ordered: Current medicines are reviewed at length with the patient today.  Concerns regarding medicines are outlined above.  No orders of the defined types were placed in this encounter.  No orders of the defined types were placed in this encounter.  Patient Instructions  Medication Instructions:  Your Physician recommend you continue on your current medication as directed.    *If you need a refill on your cardiac medications before your next appointment, please call your pharmacy*   Lab Work: None ordered today   Testing/Procedures: None ordered today    Follow-Up: At [facility], you and your health needs are our priority.  As part of our continuing mission to provide you with exceptional heart care, we have created designated Provider Care Teams.  These Care Teams include your primary Cardiologist (physician) and Advanced Practice Providers (APPs -  Physician Assistants and Nurse Practitioners) who all work together to provide you with the care you need, when you need it.  We recommend signing up for the patient portal called "MyChart".  Sign up information is provided on this After Visit Summary.  MyChart is used to connect with patients for Virtual Visits (Telemedicine).  Patients are able to view lab/test results, encounter notes, upcoming appointments, etc.  Non-urgent messages can be sent to your provider as well.   To learn more about what you can do with MyChart, go to [email or web address].    Your next appointment:   6 month(s)  The format for  your next appointment:   In Person  Provider:   Buford Dresser, MD{  Other Instructions Recent data, summarized from [place] from a study from the [facility]:  https://wilkins.info/  "Researchers at the [facility] set out to answer this question by comparing statins to supplements in a clinical trial. They tracked the outcomes of 190 adults, ages 29 to 47. Some participants were given a 5 mg daily dose of rosuvastatin, a statin that is sold under the brand name Crestor for 28 days. Others were given supplements, including fish oil, cinnamon, garlic, turmeric, plant sterols or red yeast rice for the same period."  "What we found was that rosuvastatin lowered LDL cholesterol by almost 38% and that was vastly superior to placebo and any of the six supplements studied in the trial," study Despina Hidden, M.D. of the [facility], [place] told NPR. He says this level of reduction is enough to lower the risk of heart attacks and strokes. The findings are published in the Journal of the [employer] of Cardiology.  "Oftentimes these supplements are marketed as 'natural ways' to lower your cholesterol," says Laffin. But he says none of the dietary supplements demonstrated any significant decrease in LDL cholesterol compared with a placebo. LDL cholesterol is considered the 'bad cholesterol' because it can contribute to plaque build-up in the artery walls - which can narrow the arteries, and set the stage for heart attacks and strokes"    I,Mykaella Javier,acting as a scribe for [employer], MD.,have documented all relevant documentation on the behalf of Buford Dresser, MD,as directed by  Buford Dresser, MD while in the presence of Buford Dresser, MD.  I, Buford Dresser, MD, have reviewed all  documentation for this visit. The documentation on 10/26/21 for the exam, diagnosis, procedures, and orders are all accurate and complete.   Signed, Buford Dresser, MD PhD 10/26/2021 8:48 AM    [place]

## 2021-10-26 ENCOUNTER — Encounter (HOSPITAL_BASED_OUTPATIENT_CLINIC_OR_DEPARTMENT_OTHER): Payer: Self-pay | Admitting: Cardiology

## 2021-11-06 DIAGNOSIS — I10 Essential (primary) hypertension: Secondary | ICD-10-CM | POA: Diagnosis not present

## 2021-11-06 DIAGNOSIS — E7800 Pure hypercholesterolemia, unspecified: Secondary | ICD-10-CM | POA: Diagnosis not present

## 2021-11-25 ENCOUNTER — Other Ambulatory Visit: Payer: Self-pay | Admitting: Cardiology

## 2021-11-26 NOTE — Telephone Encounter (Signed)
Rx(s) sent to pharmacy electronically.  

## 2021-12-03 ENCOUNTER — Emergency Department (HOSPITAL_BASED_OUTPATIENT_CLINIC_OR_DEPARTMENT_OTHER): Payer: PPO

## 2021-12-03 ENCOUNTER — Emergency Department (HOSPITAL_BASED_OUTPATIENT_CLINIC_OR_DEPARTMENT_OTHER): Payer: PPO | Admitting: Radiology

## 2021-12-03 ENCOUNTER — Other Ambulatory Visit: Payer: Self-pay

## 2021-12-03 ENCOUNTER — Emergency Department (HOSPITAL_BASED_OUTPATIENT_CLINIC_OR_DEPARTMENT_OTHER)
Admission: EM | Admit: 2021-12-03 | Discharge: 2021-12-04 | Disposition: A | Discharge status: Home / Self Care | Payer: PPO | Source: Home / Self Care | Attending: Emergency Medicine | Admitting: Emergency Medicine

## 2021-12-03 ENCOUNTER — Encounter (HOSPITAL_BASED_OUTPATIENT_CLINIC_OR_DEPARTMENT_OTHER): Payer: Self-pay

## 2021-12-03 DIAGNOSIS — I10 Essential (primary) hypertension: Secondary | ICD-10-CM | POA: Insufficient documentation

## 2021-12-03 DIAGNOSIS — E1122 Type 2 diabetes mellitus with diabetic chronic kidney disease: Secondary | ICD-10-CM | POA: Diagnosis not present

## 2021-12-03 DIAGNOSIS — Z9049 Acquired absence of other specified parts of digestive tract: Secondary | ICD-10-CM | POA: Diagnosis not present

## 2021-12-03 DIAGNOSIS — H538 Other visual disturbances: Secondary | ICD-10-CM | POA: Diagnosis not present

## 2021-12-03 DIAGNOSIS — I25119 Atherosclerotic heart disease of native coronary artery with unspecified angina pectoris: Secondary | ICD-10-CM | POA: Diagnosis not present

## 2021-12-03 DIAGNOSIS — H93A3 Pulsatile tinnitus, bilateral: Secondary | ICD-10-CM | POA: Diagnosis not present

## 2021-12-03 DIAGNOSIS — I6501 Occlusion and stenosis of right vertebral artery: Secondary | ICD-10-CM | POA: Diagnosis not present

## 2021-12-03 DIAGNOSIS — R202 Paresthesia of skin: Secondary | ICD-10-CM | POA: Insufficient documentation

## 2021-12-03 DIAGNOSIS — Z881 Allergy status to other antibiotic agents status: Secondary | ICD-10-CM | POA: Diagnosis not present

## 2021-12-03 DIAGNOSIS — M546 Pain in thoracic spine: Secondary | ICD-10-CM | POA: Insufficient documentation

## 2021-12-03 DIAGNOSIS — Z7982 Long term (current) use of aspirin: Secondary | ICD-10-CM | POA: Diagnosis not present

## 2021-12-03 DIAGNOSIS — I252 Old myocardial infarction: Secondary | ICD-10-CM | POA: Diagnosis not present

## 2021-12-03 DIAGNOSIS — R42 Dizziness and giddiness: Secondary | ICD-10-CM | POA: Insufficient documentation

## 2021-12-03 DIAGNOSIS — R531 Weakness: Secondary | ICD-10-CM | POA: Diagnosis not present

## 2021-12-03 DIAGNOSIS — I6523 Occlusion and stenosis of bilateral carotid arteries: Secondary | ICD-10-CM | POA: Diagnosis not present

## 2021-12-03 DIAGNOSIS — R030 Elevated blood-pressure reading, without diagnosis of hypertension: Secondary | ICD-10-CM | POA: Diagnosis not present

## 2021-12-03 DIAGNOSIS — I2510 Atherosclerotic heart disease of native coronary artery without angina pectoris: Secondary | ICD-10-CM | POA: Diagnosis not present

## 2021-12-03 DIAGNOSIS — E2609 Other primary hyperaldosteronism: Secondary | ICD-10-CM | POA: Diagnosis not present

## 2021-12-03 DIAGNOSIS — R519 Headache, unspecified: Secondary | ICD-10-CM | POA: Diagnosis not present

## 2021-12-03 DIAGNOSIS — E269 Hyperaldosteronism, unspecified: Secondary | ICD-10-CM | POA: Diagnosis not present

## 2021-12-03 DIAGNOSIS — Z886 Allergy status to analgesic agent status: Secondary | ICD-10-CM | POA: Diagnosis not present

## 2021-12-03 DIAGNOSIS — E7800 Pure hypercholesterolemia, unspecified: Secondary | ICD-10-CM | POA: Diagnosis not present

## 2021-12-03 DIAGNOSIS — Z955 Presence of coronary angioplasty implant and graft: Secondary | ICD-10-CM | POA: Insufficient documentation

## 2021-12-03 DIAGNOSIS — Z20822 Contact with and (suspected) exposure to covid-19: Secondary | ICD-10-CM | POA: Diagnosis not present

## 2021-12-03 DIAGNOSIS — I5032 Chronic diastolic (congestive) heart failure: Secondary | ICD-10-CM | POA: Diagnosis not present

## 2021-12-03 DIAGNOSIS — I130 Hypertensive heart and chronic kidney disease with heart failure and stage 1 through stage 4 chronic kidney disease, or unspecified chronic kidney disease: Secondary | ICD-10-CM | POA: Diagnosis not present

## 2021-12-03 DIAGNOSIS — N1831 Chronic kidney disease, stage 3a: Secondary | ICD-10-CM | POA: Diagnosis not present

## 2021-12-03 DIAGNOSIS — Z8249 Family history of ischemic heart disease and other diseases of the circulatory system: Secondary | ICD-10-CM | POA: Diagnosis not present

## 2021-12-03 DIAGNOSIS — Z888 Allergy status to other drugs, medicaments and biological substances status: Secondary | ICD-10-CM | POA: Diagnosis not present

## 2021-12-03 DIAGNOSIS — Z823 Family history of stroke: Secondary | ICD-10-CM | POA: Diagnosis not present

## 2021-12-03 DIAGNOSIS — R079 Chest pain, unspecified: Secondary | ICD-10-CM | POA: Diagnosis not present

## 2021-12-03 DIAGNOSIS — G8194 Hemiplegia, unspecified affecting left nondominant side: Secondary | ICD-10-CM | POA: Diagnosis not present

## 2021-12-03 DIAGNOSIS — E2740 Unspecified adrenocortical insufficiency: Secondary | ICD-10-CM | POA: Diagnosis not present

## 2021-12-03 DIAGNOSIS — R55 Syncope and collapse: Secondary | ICD-10-CM | POA: Diagnosis not present

## 2021-12-03 LAB — CBC
HCT: 36.8 % (ref 36.0–46.0)
Hemoglobin: 12.3 g/dL (ref 12.0–15.0)
MCH: 28.2 pg (ref 26.0–34.0)
MCHC: 33.4 g/dL (ref 30.0–36.0)
MCV: 84.4 fL (ref 80.0–100.0)
Platelets: 202 10*3/uL (ref 150–400)
RBC: 4.36 MIL/uL (ref 3.87–5.11)
RDW: 13.4 % (ref 11.5–15.5)
WBC: 4.6 10*3/uL (ref 4.0–10.5)
nRBC: 0 % (ref 0.0–0.2)

## 2021-12-03 LAB — BASIC METABOLIC PANEL
Anion gap: 7 (ref 5–15)
BUN: 15 mg/dL (ref 8–23)
CO2: 29 mmol/L (ref 22–32)
Calcium: 9.9 mg/dL (ref 8.9–10.3)
Chloride: 103 mmol/L (ref 98–111)
Creatinine, Ser: 1.02 mg/dL — ABNORMAL HIGH (ref 0.44–1.00)
GFR, Estimated: 58 mL/min — ABNORMAL LOW (ref >60)
Glucose, Bld: 88 mg/dL (ref 70–99)
Potassium: 4.3 mmol/L (ref 3.5–5.1)
Sodium: 139 mmol/L (ref 135–145)

## 2021-12-03 LAB — TROPONIN I (HIGH SENSITIVITY)
Troponin I (High Sensitivity): 3 ng/L (ref <18)
Troponin I (High Sensitivity): 4 ng/L (ref <18)

## 2021-12-03 MED ORDER — SODIUM CHLORIDE 0.9 % IV BOLUS
1000.0000 mL | Freq: Once | INTRAVENOUS | Status: AC
  Administered 2021-12-03: 1000 mL via INTRAVENOUS

## 2021-12-03 MED ORDER — IOHEXOL 350 MG/ML SOLN
100.0000 mL | Freq: Once | INTRAVENOUS | Status: AC | PRN
  Administered 2021-12-03: 75 mL via INTRAVENOUS

## 2021-12-03 NOTE — ED Triage Notes (Signed)
Pt arrives POV with her husband. ? ?C/O feeling dizzy, lightheaded and weak for about 1 week. ? ?She says she feels very lightheaded when walking, standing, or anytime she tries to drink something. ? ?Had vomiting and diarrhea on Friday and Saturday, but no episodes since Saturday. ? ?Denies any chest pain or shortness of breath, and fever. ? ?She reports intermittent mid back pain that she experienced when she had a MI in 2020. ? ?Pt in wheelchair but NAD noted in triage. ?

## 2021-12-03 NOTE — ED Notes (Signed)
Pt taken to restroom via w/c, RN stayed with pt as she voiced dizziness. Pt taken back to room via w/c and placed on the monitor. Pt is upset about waiting to see provider, updated and apologized for delay. Discussed with pt provider will discuss lab results with her during his assessment. Family remains at bedside.  ?

## 2021-12-04 ENCOUNTER — Encounter (HOSPITAL_COMMUNITY): Payer: Self-pay | Admitting: Pharmacy Technician

## 2021-12-04 ENCOUNTER — Other Ambulatory Visit: Payer: Self-pay

## 2021-12-04 ENCOUNTER — Emergency Department (HOSPITAL_COMMUNITY): Payer: PPO

## 2021-12-04 ENCOUNTER — Inpatient Hospital Stay (HOSPITAL_COMMUNITY)
Admission: EM | Admit: 2021-12-04 | Discharge: 2021-12-07 | DRG: 312 | Disposition: A | Discharge status: Home Health | Payer: PPO | Attending: Internal Medicine | Admitting: Internal Medicine

## 2021-12-04 DIAGNOSIS — R519 Headache, unspecified: Secondary | ICD-10-CM

## 2021-12-04 DIAGNOSIS — Z79899 Other long term (current) drug therapy: Secondary | ICD-10-CM

## 2021-12-04 DIAGNOSIS — E2740 Unspecified adrenocortical insufficiency: Secondary | ICD-10-CM | POA: Diagnosis present

## 2021-12-04 DIAGNOSIS — Z90711 Acquired absence of uterus with remaining cervical stump: Secondary | ICD-10-CM

## 2021-12-04 DIAGNOSIS — Z7982 Long term (current) use of aspirin: Secondary | ICD-10-CM

## 2021-12-04 DIAGNOSIS — E7800 Pure hypercholesterolemia, unspecified: Secondary | ICD-10-CM | POA: Diagnosis present

## 2021-12-04 DIAGNOSIS — Z881 Allergy status to other antibiotic agents status: Secondary | ICD-10-CM

## 2021-12-04 DIAGNOSIS — H538 Other visual disturbances: Secondary | ICD-10-CM | POA: Diagnosis present

## 2021-12-04 DIAGNOSIS — E1122 Type 2 diabetes mellitus with diabetic chronic kidney disease: Secondary | ICD-10-CM | POA: Diagnosis present

## 2021-12-04 DIAGNOSIS — Z9049 Acquired absence of other specified parts of digestive tract: Secondary | ICD-10-CM

## 2021-12-04 DIAGNOSIS — G8194 Hemiplegia, unspecified affecting left nondominant side: Secondary | ICD-10-CM | POA: Diagnosis present

## 2021-12-04 DIAGNOSIS — R42 Dizziness and giddiness: Secondary | ICD-10-CM | POA: Diagnosis present

## 2021-12-04 DIAGNOSIS — H93A3 Pulsatile tinnitus, bilateral: Secondary | ICD-10-CM | POA: Diagnosis not present

## 2021-12-04 DIAGNOSIS — E2609 Other primary hyperaldosteronism: Secondary | ICD-10-CM | POA: Diagnosis present

## 2021-12-04 DIAGNOSIS — I5032 Chronic diastolic (congestive) heart failure: Secondary | ICD-10-CM | POA: Diagnosis present

## 2021-12-04 DIAGNOSIS — E269 Hyperaldosteronism, unspecified: Secondary | ICD-10-CM | POA: Diagnosis present

## 2021-12-04 DIAGNOSIS — Z823 Family history of stroke: Secondary | ICD-10-CM

## 2021-12-04 DIAGNOSIS — Z888 Allergy status to other drugs, medicaments and biological substances status: Secondary | ICD-10-CM

## 2021-12-04 DIAGNOSIS — Z955 Presence of coronary angioplasty implant and graft: Secondary | ICD-10-CM

## 2021-12-04 DIAGNOSIS — N1831 Chronic kidney disease, stage 3a: Secondary | ICD-10-CM | POA: Diagnosis present

## 2021-12-04 DIAGNOSIS — I130 Hypertensive heart and chronic kidney disease with heart failure and stage 1 through stage 4 chronic kidney disease, or unspecified chronic kidney disease: Secondary | ICD-10-CM | POA: Diagnosis present

## 2021-12-04 DIAGNOSIS — I2510 Atherosclerotic heart disease of native coronary artery without angina pectoris: Secondary | ICD-10-CM | POA: Diagnosis present

## 2021-12-04 DIAGNOSIS — I252 Old myocardial infarction: Secondary | ICD-10-CM

## 2021-12-04 DIAGNOSIS — I6501 Occlusion and stenosis of right vertebral artery: Secondary | ICD-10-CM | POA: Diagnosis not present

## 2021-12-04 DIAGNOSIS — R55 Syncope and collapse: Principal | ICD-10-CM | POA: Insufficient documentation

## 2021-12-04 DIAGNOSIS — R030 Elevated blood-pressure reading, without diagnosis of hypertension: Secondary | ICD-10-CM | POA: Diagnosis not present

## 2021-12-04 DIAGNOSIS — Z20822 Contact with and (suspected) exposure to covid-19: Secondary | ICD-10-CM | POA: Diagnosis present

## 2021-12-04 DIAGNOSIS — M546 Pain in thoracic spine: Secondary | ICD-10-CM | POA: Diagnosis present

## 2021-12-04 DIAGNOSIS — Z886 Allergy status to analgesic agent status: Secondary | ICD-10-CM

## 2021-12-04 DIAGNOSIS — Z8249 Family history of ischemic heart disease and other diseases of the circulatory system: Secondary | ICD-10-CM

## 2021-12-04 LAB — BASIC METABOLIC PANEL
Anion gap: 6 (ref 5–15)
BUN: 11 mg/dL (ref 8–23)
CO2: 29 mmol/L (ref 22–32)
Calcium: 9.6 mg/dL (ref 8.9–10.3)
Chloride: 104 mmol/L (ref 98–111)
Creatinine, Ser: 1.05 mg/dL — ABNORMAL HIGH (ref 0.44–1.00)
GFR, Estimated: 56 mL/min — ABNORMAL LOW (ref >60)
Glucose, Bld: 139 mg/dL — ABNORMAL HIGH (ref 70–99)
Potassium: 4 mmol/L (ref 3.5–5.1)
Sodium: 139 mmol/L (ref 135–145)

## 2021-12-04 LAB — CBC
HCT: 37.9 % (ref 36.0–46.0)
Hemoglobin: 13 g/dL (ref 12.0–15.0)
MCH: 29.3 pg (ref 26.0–34.0)
MCHC: 34.3 g/dL (ref 30.0–36.0)
MCV: 85.4 fL (ref 80.0–100.0)
Platelets: 225 10*3/uL (ref 150–400)
RBC: 4.44 MIL/uL (ref 3.87–5.11)
RDW: 13.4 % (ref 11.5–15.5)
WBC: 4 10*3/uL (ref 4.0–10.5)
nRBC: 0 % (ref 0.0–0.2)

## 2021-12-04 LAB — RESP PANEL BY RT-PCR (FLU A&B, COVID) ARPGX2
Influenza A by PCR: NEGATIVE
Influenza B by PCR: NEGATIVE
SARS Coronavirus 2 by RT PCR: NEGATIVE

## 2021-12-04 LAB — TROPONIN I (HIGH SENSITIVITY)
Troponin I (High Sensitivity): 5 ng/L (ref <18)
Troponin I (High Sensitivity): 6 ng/L (ref <18)

## 2021-12-04 MED ORDER — MECLIZINE HCL 25 MG PO TABS: 25.0000 mg | ORAL_TABLET | Freq: Three times a day (TID) | ORAL | 0 refills | Status: DC | PRN

## 2021-12-04 NOTE — Discharge Instructions (Signed)
You were evaluated in the Emergency Department and after careful evaluation, we did not find any emergent condition requiring admission or further testing in the hospital. ? ?Your exam/testing today is overall reassuring.  Recommend close follow-up with your PCP or cardiologist to discuss her symptoms ? ?Please return to the Emergency Department if you experience any worsening of your condition.   Thank you for allowing [place] to be a part of your care. ?

## 2021-12-04 NOTE — ED Provider Notes (Addendum)
?[facility] ?[facility] Emergency Department ?Provider Note ?MRN:  [id number]  ?Arrival date & time: 12/04/21    ? ?Chief Complaint   ?Dizziness ?  ?History of Present Illness   ?Anne [name] is a 76 y.o. year-old female with a history of MI presenting to the ED with chief complaint of dizziness. ? ?Several weeks of persistent dizziness described as a lightheadedness, worse when standing up, worse when trying to drink liquids.  Today had some right thoracic back discomfort that felt similar to her prior MI back in 2021.  Currently without pain.  Has also had left arm weakness and numbness for about a month. ? ?Review of Systems  ?A thorough review of systems was obtained and all systems are negative except as noted in the HPI and PMH.  ? ?Patient's Health History   ? ?Past Medical History:  ?Diagnosis Date  ? Complication of anesthesia   ? slow to wake up  ? Diverticulosis   ? Hiatal hernia   ? Hypercholesterolemia   ? Hypertension   ? Osteopenia   ? Vitamin D deficiency   ?  ?Past Surgical History:  ?Procedure Laterality Date  ? ABDOMINAL HYSTERECTOMY    ? Partial  ? CORONARY STENT INTERVENTION N/A 03/16/2020  ? Procedure: CORONARY STENT INTERVENTION;  Surgeon: Burnell Blanks, MD;  Location: [facility];  Service: Cardiovascular;  Laterality: N/A;  ? fibrocystic breast excision    ? LAPAROSCOPIC ILEOCECECTOMY  07/07/2015  ? LAPAROTOMY N/A 07/07/2015  ? Procedure: EXPLORATORY LAPAROTOMY WITH ILEOCECECTOMY ;  Surgeon: Donnie Mesa, MD;  Location: [facility];  Service: General;  Laterality: N/A;  ? LEFT HEART CATH AND CORONARY ANGIOGRAPHY N/A 03/16/2020  ? Procedure: LEFT HEART CATH AND CORONARY ANGIOGRAPHY;  Surgeon: Burnell Blanks, MD;  Location: [facility];  Service: Cardiovascular;  Laterality: N/A;  ? TUBAL LIGATION    ?  ?Family History  ?Problem Relation Age of Onset  ? Stroke Mother   ? Hypertension Mother   ? Stroke Brother   ? Cancer Neg Hx   ? Alcohol abuse Neg Hx    ? Early death Neg Hx   ? Hearing loss Neg Hx   ? Heart disease Neg Hx   ? Hyperlipidemia Neg Hx   ? Kidney disease Neg Hx   ?  ?Social History  ? ?Socioeconomic History  ? Marital status: Married  ?  Spouse name: Not on file  ? Number of children: 2  ? Years of education: Not on file  ? Highest education level: Not on file  ?Occupational History  ? Occupation: retired  ?Tobacco Use  ? Smoking status: Never  ? Smokeless tobacco: Never  ?Vaping Use  ? Vaping Use: Never used  ?Substance and Sexual Activity  ? Alcohol use: Yes  ?  Comment: 3 times a year  ? Drug use: No  ? Sexual activity: Yes  ?  Birth control/protection: Surgical  ?Other Topics Concern  ? Not on file  ?Social History Narrative  ? Not on file  ? ?Social Determinants of Health  ? ?Financial Resource Strain: Not on file  ?Food Insecurity: Not on file  ?Transportation Needs: Not on file  ?Physical Activity: Not on file  ?Stress: Not on file  ?Social Connections: Not on file  ?Intimate Partner Violence: Not on file  ?  ? ?Physical Exam  ? ?Vitals:  ? 12/04/21 0030 12/04/21 0110  ?BP: (!) 174/85 (!) 173/91  ?Pulse: 71 69  ?Resp:  17 15  ?Temp:    ?SpO2: 99% 97%  ?  ?CONSTITUTIONAL: Well-appearing, NAD ?NEURO/PSYCH:  Alert and oriented x 3, decreased grip strength in the left hand ?EYES:  eyes equal and reactive ?ENT/NECK:  no LAD, no JVD ?CARDIO: Regular rate, well-perfused, normal S1 and S2 ?PULM:  CTAB no wheezing or rhonchi ?GI/GU:  non-distended, non-tender ?MSK/SPINE:  No gross deformities, no edema ?SKIN:  no rash, atraumatic ? ? ?*Additional and/or pertinent findings included in MDM below ? ?Diagnostic and Interventional Summary  ? ? EKG Interpretation ? ?Date/Time:  Monday December 03 2021 17:10:14 EDT ?Ventricular Rate:  70 ?PR Interval:  172 ?QRS Duration: 124 ?QT Interval:  398 ?QTC Calculation: 429 ?R Axis:   -8 ?Text Interpretation: Normal sinus rhythm with sinus arrhythmia Right bundle branch block Abnormal ECG When compared with ECG of  18-Apr-2020 14:20, Right bundle branch block is now Present Borderline criteria for Anterior infarct are no longer Present Confirmed by Gerlene Fee [phone]) on 12/03/2021 10:58:50 PM ?  ? ?  ? ?Labs Reviewed  ?BASIC METABOLIC PANEL - Abnormal; Notable for the following components:  ?    Result Value  ? Creatinine, Ser 1.02 (*)   ? GFR, Estimated 58 (*)   ? All other components within normal limits  ?CBC  ?TROPONIN I (HIGH SENSITIVITY)  ?TROPONIN I (HIGH SENSITIVITY)  ?  ?CT ANGIO HEAD NECK W WO CM  ?Final Result  ?  ?DG Chest 2 View  ?Final Result  ?  ?  ?Medications  ?sodium chloride 0.9 % bolus 1,000 mL (0 mLs Intravenous Stopped 12/04/21 0103)  ?iohexol (OMNIPAQUE) 350 MG/ML injection 100 mL (75 mLs Intravenous Contrast Given 12/03/21 2324)  ?  ? ?Procedures  /  Critical Care ?Procedures ? ?ED Course and Medical Decision Making  ?Initial Impression and Ddx ?Differential diagnosis includes ACS, stroke, MSK pain.  Given the persistent dizziness and more chronic left arm weakness will obtain CTA head and neck. ? ?Past medical/surgical history that increases complexity of ED encounter: History of MI ? ?Interpretation of Diagnostics ?I personally reviewed the EKG and my interpretation is as follows: Right bundle branch block new from prior 2 years ago ?   ?CBC/BMP do not reveal any significant blood count or electrolyte disturbance.  Troponin is negative x2. ? ?CT head is without acute MI, no signs of posterior circulation insufficiency.  There is carotid atherosclerosis on the right.  Given the more chronic nature of patient's left hand grip strength and fairly reassuring CTA, there is no emergent need for MRI at this time. ? ?Patient Reassessment and Ultimate Disposition/Management ?Patient feeling well, no pain at this time.  Doubt PE, doubt dissection.  Given her pain similar to prior MI, admission offered.  Patient prefers to go home and follow-up with her cardiologist.  Heart score of 5 and so either plan is  appropriate.  Discharge home. ? ?Patient management required discussion with the following services or consulting groups:  None ? ?Complexity of Problems Addressed ?Acute illness or injury that poses threat of life of bodily function ? ?Additional Data Reviewed and Analyzed ?Further history obtained from: ?Further history from spouse/family member ? ?Additional Factors Impacting ED Encounter Risk ?Consideration of hospitalization ? ?Barth Kirks. Sedonia Small, MD ?[facility] ?[place] [email or web address] ? ?Final Clinical Impressions(s) / ED Diagnoses  ? ?  ICD-10-CM   ?1. Lightheadedness  R42   ?  ?2. Acute right-sided thoracic back pain  M54.6   ?  ?  ?  ED Discharge Orders   ? ?      Ordered  ?  meclizine (ANTIVERT) 25 MG tablet  3 times daily PRN       ? 12/04/21 0139  ? ?  ?  ? ?  ?  ? ?Discharge Instructions Discussed with and Provided to Patient:  ? ? ? ?Discharge Instructions   ? ?  ?You were evaluated in the Emergency Department and after careful evaluation, we did not find any emergent condition requiring admission or further testing in the hospital. ? ?Your exam/testing today is overall reassuring.  Recommend close follow-up with your PCP or cardiologist to discuss her symptoms ? ?Please return to the Emergency Department if you experience any worsening of your condition.   Thank you for allowing [place] to be a part of your care. ? ? ? ? ?  ?Maudie Flakes, MD ?12/04/21 0133 ? ?  ?Maudie Flakes, MD ?12/04/21 0139 ? ?

## 2021-12-04 NOTE — ED Notes (Signed)
Patient transported to MRI 

## 2021-12-04 NOTE — ED Notes (Signed)
Pt verbalizes understanding of discharge instructions. Opportunity for questioning and answers were provided. Pt discharged from ED to home.   ? ?

## 2021-12-04 NOTE — ED Provider Notes (Signed)
?[place] ?Provider Note ? ? ?CSN: [id number] ?Arrival date & time: 12/04/21  1442 ? ?  ? ?History ? ?Chief Complaint  ?Patient presents with  ? Dizziness  ? ? ?Anne [name] is a 76 y.o. female with past medical history of NSTEMI status post drug-eluting stent x1 to LAD 03/2021, CAD, hypertension, renal insufficiency, vertigo.  Presents to the emergency department with a chief complaint of lightheadedness, headache, and ataxia. ? ?Patient reports that she has been having lightheadedness over the last month.  This has gotten worse over the last 2 weeks.  Patient has increased lightheadedness when standing and ambulating.  Patient denies any associated chest pain, shortness of breath, or syncope.  Patient does report that she has had palpitations throughout her entire life.  Has noticed increased palpitations over the last 2 weeks.  Palpitations last for 2 to 3 minutes and resolve after she performs vasovagal maneuver.  Per chart review patient was seen at [facility] yesterday night for similar symptoms.  Patient was offered admission however declined to follow-up with her PCP today.  Patient was seen by her PCP and sent to the emergency department for further cardiac work-up. ? ?Additionally patient reports that she has been having constant headache over the 2 weeks.  Patient denies any aggravating factors.  Has tried OTC medications without any improvement in her symptoms.  Patient states that for the last month she has also been having ataxia.  Patient states that she has decreased her ambulation dramatically due to this.  When she does need to ambulate she states that she shuffles her gait and has to hold onto things to keep from falling over.  Patient reports feeling dizzy during these episodes as well as lightheaded.  Patient states that she is developed tinnitus to bilateral ears which she describes as a " whooshing," noise.  Patient endorses numbness to the  digits of her left hand which has been persistent over the last 2 weeks.  Additionally patient reports that she has had bilateral blurry vision over the last 2 weeks. ? ?Patient denies any numbness, facial asymmetry, dysarthria, abdominal pain, nausea, vomiting, diarrhea, fevers, dysuria, hematuria, urinary urgency, vaginal pain, vaginal bleeding, vaginal discharge. ? ?No recent falls or head injuries. ? ? ?Dizziness ?Associated symptoms: palpitations and tinnitus   ?Associated symptoms: no chest pain, no diarrhea, no headaches, no nausea, no shortness of breath, no vomiting and no weakness   ? ?  ? ?Home Medications ?Prior to Admission medications   ?Medication Sig Start Date End Date Taking? Authorizing Provider  ?B Complex-C (B-COMPLEX WITH VITAMIN C) tablet Take 1 tablet by mouth daily.    [provider]  ?COVID-19 mRNA bivalent vaccine, Pfizer, (PFIZER COVID-19 VAC BIVALENT) injection Inject into the muscle. 06/27/21   Carlyle Basques, MD  ?[facility] ASPIRIN LOW DOSE 81 MG EC tablet TAKE 1 TABLET (81 MG TOTAL) BY MOUTH DAILY. SWALLOW WHOLE. 11/26/21   Buford Dresser, MD  ?GARLIC PO Take 2 tablets by mouth daily.     [provider]  ?Magnesium 500 MG TABS Take 500 tablets by mouth.    [provider]  ?meclizine (ANTIVERT) 25 MG tablet Take 1 tablet (25 mg total) by mouth 3 (three) times daily as needed for dizziness. 12/04/21   Maudie Flakes, MD  ?Multiple Vitamins-Minerals (MULTIVITAMIN WITH MINERALS) tablet Take 1 tablet by mouth daily.    [provider]  ?nitroGLYCERIN (NITROSTAT) 0.4 MG SL tablet Place 1 tablet (  0.4 mg total) under the tongue every 5 (five) minutes as needed for chest pain. May repeat 2 times, if 3rd dose needed call 911 03/01/21 10/24/21  Almyra Deforest, PA  ?spironolactone (ALDACTONE) 25 MG tablet Take 25 mg by mouth daily.    [provider]  ?traMADol (ULTRAM) 50 MG tablet Take 1 tablet (50 mg total) by mouth every 6 (six) hours as needed.  08/11/21   Tegeler, Gwenyth Allegra, MD  ?   ? ?Allergies    ?Amiloride; Anesthetics, amide; Other; Prilocaine; Amlodipine besylate; Asa [aspirin]; Augmentin [amoxicillin-pot clavulanate]; Cefdinir; Diltiazem hcl; Eplerenone; Indomethacin; Naproxen; Tenex [guanfacine hcl]; Triamterene-hctz; Nebivolol hcl; Potassium chloride; Hytrin [terazosin hcl]; and Lisinopril   ? ?Review of Systems   ?Review of Systems  ?Constitutional:  Negative for chills and fever.  ?HENT:  Positive for tinnitus.   ?Eyes:  Negative for visual disturbance.  ?Respiratory:  Negative for shortness of breath.   ?Cardiovascular:  Positive for palpitations. Negative for chest pain and leg swelling.  ?Gastrointestinal:  Negative for abdominal pain, diarrhea, nausea and vomiting.  ?Genitourinary:  Negative for difficulty urinating, dysuria, frequency, hematuria, urgency, vaginal bleeding, vaginal discharge and vaginal pain.  ?Musculoskeletal:  Negative for back pain and neck pain.  ?Skin:  Negative for color change and rash.  ?Neurological:  Positive for dizziness and numbness. Negative for tremors, seizures, syncope, facial asymmetry, speech difficulty, weakness, light-headedness and headaches.  ?Psychiatric/Behavioral:  Negative for confusion.   ? ?Physical Exam ?Updated Vital Signs ?BP (!) 169/92   Pulse 80   Temp 99.3 ?F (37.4 ?C) (Oral)   Resp 15   SpO2 99%  ?Physical Exam ?Vitals and nursing note reviewed.  ?Constitutional:   ?   General: She is not in acute distress. ?   Appearance: She is not ill-appearing, toxic-appearing or diaphoretic.  ?HENT:  ?   Head: Normocephalic and atraumatic. No raccoon eyes, Battle's sign, abrasion, contusion, right periorbital erythema, left periorbital erythema or laceration.  ?Eyes:  ?   General:     ?   Right eye: No discharge.     ?   Left eye: No discharge.  ?   Extraocular Movements: Extraocular movements intact.  ?   Conjunctiva/sclera: Conjunctivae normal.  ?   Pupils: Pupils are equal, round, and  reactive to light.  ?Cardiovascular:  ?   Rate and Rhythm: Normal rate.  ?   Pulses:     ?     Radial pulses are 2+ on the right side and 2+ on the left side.  ?   Heart sounds: Normal heart sounds, S1 normal and S2 normal. No murmur heard. ?Pulmonary:  ?   Effort: Pulmonary effort is normal. No tachypnea, bradypnea or respiratory distress.  ?   Breath sounds: Normal breath sounds. No stridor.  ?Abdominal:  ?   General: Abdomen is flat. There is no distension. There are no signs of injury.  ?   Palpations: Abdomen is soft. There is no mass or pulsatile mass.  ?   Tenderness: There is no abdominal tenderness. There is no guarding or rebound.  ?   Hernia: There is no hernia in the umbilical area or ventral area.  ?Musculoskeletal:  ?   Cervical back: Neck supple. No rigidity.  ?   Right lower leg: No edema.  ?   Left lower leg: No edema.  ?Skin: ?   General: Skin is warm and dry.  ?Neurological:  ?   General: No focal deficit present.  ?  Mental Status: She is alert and oriented to person, place, and time.  ?   GCS: GCS eye subscore is 4. GCS verbal subscore is 5. GCS motor subscore is 6.  ?   Cranial Nerves: No cranial nerve deficit or facial asymmetry.  ?   Sensory: Sensation is intact.  ?   Motor: No weakness, tremor, seizure activity or pronator drift.  ?   Coordination: Finger-Nose-Finger Test normal.  ?   Comments: CN II-XII intact; performed in supine position due to reports of ataxia, +5 strength to bilateral upper extremities, +5 strength to dorsiflexion and plantarflexion, patient able to lift both legs against gravity and hold each there without difficulty  ?  ?Psychiatric:     ?   Behavior: Behavior is cooperative.  ? ? ?ED Results / Procedures / Treatments   ?Labs ?(all labs ordered are listed, but only abnormal results are displayed) ?Labs Reviewed  ?BASIC METABOLIC PANEL - Abnormal; Notable for the following components:  ?    Result Value  ? Glucose, Bld 139 (*)   ? Creatinine, Ser 1.05 (*)   ? GFR,  Estimated 56 (*)   ? All other components within normal limits  ?RESP PANEL BY RT-PCR (FLU A&B, COVID) ARPGX2  ?CBC  ?TROPONIN I (HIGH SENSITIVITY)  ?TROPONIN I (HIGH SENSITIVITY)  ? ? ?EKG ?None ? ?Radiology ?CT ANG

## 2021-12-04 NOTE — ED Triage Notes (Signed)
Pt here for admission for ACS and vascular workup. Seen last night for the same symptoms and denied admission. Went to PCP and was sent back here as pt now agreeable for admission.  ?

## 2021-12-04 NOTE — ED Provider Triage Note (Signed)
Emergency Medicine Provider Triage Evaluation Note ? ?Anne [name] , a 76 y.o. female  was evaluated in triage.  She was seen last night by Dr. Maurie Boettcher after having chest pain that radiated to her back as well as headache and dizziness.  Apparently she had a negative cardiac work-up, but given that her symptoms were similar to her previous ACS episode, she was offered admission and declined.  She went to see her PCP today who noted that patient was having continued blurry vision, continued dizziness, balance issues.  She reviewed the CT scan last night which showed some carotid artery stenosis.  Recommended she come back to the emergency room for admission and also get neuroimaging to assess for posterior circulating stroke and repeat cardiac enzymes. ? ?Symptoms have worsened since yesterday. Patient states that she has room spinning constantly with ringing in both ears. This is present all the time, even when at rest and with head straight. Chest pain is still constant.  ? ?Review of Systems  ?Positive: Dizziness, gait disturbance, blurry vision, chest pain, back pain ?Negative:  ? ?Physical Exam  ?BP (!) 145/68 (BP Location: Right Arm)   Pulse 77   Temp 99.3 ?F (37.4 ?C) (Oral)   Resp 16   SpO2 100%  ?Gen:   Awake, no distress   ?Resp:  Normal effort  ?MSK:   Moves extremities without difficulty  ?Other:   ? ?Medical Decision Making  ?Medically screening exam initiated at 3:23 PM.  Appropriate orders placed.  See ALISSIA LORY was informed that the remainder of the evaluation will be completed by another provider, this initial triage assessment does not replace that evaluation, and the importance of remaining in the ED until their evaluation is complete. ? ? ?  ?Adolphus Birchwood, PA-C ?12/04/21 1525 ? ?

## 2021-12-05 ENCOUNTER — Encounter (HOSPITAL_COMMUNITY): Payer: Self-pay | Admitting: Internal Medicine

## 2021-12-05 ENCOUNTER — Observation Stay (HOSPITAL_BASED_OUTPATIENT_CLINIC_OR_DEPARTMENT_OTHER): Payer: PPO

## 2021-12-05 DIAGNOSIS — I5032 Chronic diastolic (congestive) heart failure: Secondary | ICD-10-CM | POA: Diagnosis present

## 2021-12-05 DIAGNOSIS — R42 Dizziness and giddiness: Secondary | ICD-10-CM

## 2021-12-05 DIAGNOSIS — I25119 Atherosclerotic heart disease of native coronary artery with unspecified angina pectoris: Secondary | ICD-10-CM | POA: Diagnosis not present

## 2021-12-05 DIAGNOSIS — E1122 Type 2 diabetes mellitus with diabetic chronic kidney disease: Secondary | ICD-10-CM

## 2021-12-05 DIAGNOSIS — R55 Syncope and collapse: Secondary | ICD-10-CM

## 2021-12-05 DIAGNOSIS — R079 Chest pain, unspecified: Secondary | ICD-10-CM

## 2021-12-05 DIAGNOSIS — N1831 Chronic kidney disease, stage 3a: Secondary | ICD-10-CM

## 2021-12-05 HISTORY — DX: Chronic kidney disease, stage 3a: N18.31

## 2021-12-05 LAB — CBG MONITORING, ED
Glucose-Capillary: 93 mg/dL (ref 70–99)
Glucose-Capillary: 106 mg/dL — ABNORMAL HIGH (ref 70–99)

## 2021-12-05 LAB — CBC WITH DIFFERENTIAL/PLATELET
Abs Immature Granulocytes: 0 10*3/uL (ref 0.00–0.07)
Basophils Absolute: 0 10*3/uL (ref 0.0–0.1)
Basophils Relative: 1 %
Eosinophils Absolute: 0.1 10*3/uL (ref 0.0–0.5)
Eosinophils Relative: 2 %
HCT: 32 % — ABNORMAL LOW (ref 36.0–46.0)
Hemoglobin: 11.2 g/dL — ABNORMAL LOW (ref 12.0–15.0)
Immature Granulocytes: 0 %
Lymphocytes Relative: 40 %
Lymphs Abs: 1.4 10*3/uL (ref 0.7–4.0)
MCH: 29.6 pg (ref 26.0–34.0)
MCHC: 35 g/dL (ref 30.0–36.0)
MCV: 84.4 fL (ref 80.0–100.0)
Monocytes Absolute: 0.3 10*3/uL (ref 0.1–1.0)
Monocytes Relative: 9 %
Neutro Abs: 1.7 10*3/uL (ref 1.7–7.7)
Neutrophils Relative %: 48 %
Platelets: 185 10*3/uL (ref 150–400)
RBC: 3.79 MIL/uL — ABNORMAL LOW (ref 3.87–5.11)
RDW: 13.4 % (ref 11.5–15.5)
WBC: 3.4 10*3/uL — ABNORMAL LOW (ref 4.0–10.5)
nRBC: 0 % (ref 0.0–0.2)

## 2021-12-05 LAB — LIPID PANEL
Cholesterol: 164 mg/dL (ref 0–200)
HDL: 60 mg/dL (ref >40)
LDL Cholesterol: 98 mg/dL (ref 0–99)
Total CHOL/HDL Ratio: 2.7 RATIO
Triglycerides: 31 mg/dL (ref <150)
VLDL: 6 mg/dL (ref 0–40)

## 2021-12-05 LAB — COMPREHENSIVE METABOLIC PANEL
ALT: 14 U/L (ref 0–44)
AST: 20 U/L (ref 15–41)
Albumin: 3.5 g/dL (ref 3.5–5.0)
Alkaline Phosphatase: 45 U/L (ref 38–126)
Anion gap: 9 (ref 5–15)
BUN: 13 mg/dL (ref 8–23)
CO2: 22 mmol/L (ref 22–32)
Calcium: 9 mg/dL (ref 8.9–10.3)
Chloride: 107 mmol/L (ref 98–111)
Creatinine, Ser: 0.94 mg/dL (ref 0.44–1.00)
GFR, Estimated: >60 mL/min (ref >60)
Glucose, Bld: 95 mg/dL (ref 70–99)
Potassium: 3.6 mmol/L (ref 3.5–5.1)
Sodium: 138 mmol/L (ref 135–145)
Total Bilirubin: 1.1 mg/dL (ref 0.3–1.2)
Total Protein: 6.2 g/dL — ABNORMAL LOW (ref 6.5–8.1)

## 2021-12-05 LAB — HEMOGLOBIN A1C
Hgb A1c MFr Bld: 5.9 % — ABNORMAL HIGH (ref 4.8–5.6)
Mean Plasma Glucose: 122.63 mg/dL

## 2021-12-05 LAB — ECHOCARDIOGRAM COMPLETE
Area-P 1/2: 2.93 cm2
S' Lateral: 2.8 cm

## 2021-12-05 LAB — MAGNESIUM: Magnesium: 1.9 mg/dL (ref 1.7–2.4)

## 2021-12-05 LAB — CORTISOL: Cortisol, Plasma: 3.8 ug/dL

## 2021-12-05 LAB — TROPONIN I (HIGH SENSITIVITY): Troponin I (High Sensitivity): 7 ng/L (ref <18)

## 2021-12-05 MED ORDER — ACETAMINOPHEN 650 MG RE SUPP: 650.0000 mg | Freq: Four times a day (QID) | RECTAL | Status: DC | PRN

## 2021-12-05 MED ORDER — ACETAMINOPHEN 325 MG PO TABS: 650.0000 mg | ORAL_TABLET | Freq: Four times a day (QID) | ORAL | Status: DC | PRN

## 2021-12-05 MED ORDER — ASPIRIN EC 81 MG PO TBEC
81.0000 mg | DELAYED_RELEASE_TABLET | Freq: Every day | ORAL | Status: DC
  Administered 2021-12-05 – 2021-12-07 (×3): 81 mg via ORAL
  Filled 2021-12-05 (×3): qty 1

## 2021-12-05 MED ORDER — INSULIN ASPART 100 UNIT/ML IJ SOLN: 0.0000 [IU] | Freq: Three times a day (TID) | INTRAMUSCULAR | Status: DC

## 2021-12-05 MED ORDER — POLYETHYLENE GLYCOL 3350 17 G PO PACK: 17.0000 g | PACK | Freq: Every day | ORAL | Status: DC | PRN

## 2021-12-05 MED ORDER — GADOBUTROL 1 MMOL/ML IV SOLN
5.0000 mL | Freq: Once | INTRAVENOUS | Status: AC | PRN
  Administered 2021-12-05: 5 mL via INTRAVENOUS

## 2021-12-05 MED ORDER — MECLIZINE HCL 25 MG PO TABS
25.0000 mg | ORAL_TABLET | Freq: Three times a day (TID) | ORAL | Status: DC
Start: 2021-12-05 — End: 2021-12-07
  Administered 2021-12-05 – 2021-12-07 (×7): 25 mg via ORAL
  Filled 2021-12-05 (×7): qty 1

## 2021-12-05 MED ORDER — SPIRONOLACTONE 25 MG PO TABS
25.0000 mg | ORAL_TABLET | Freq: Every day | ORAL | Status: DC
Start: 2021-12-05 — End: 2021-12-07
  Administered 2021-12-05 – 2021-12-07 (×3): 25 mg via ORAL
  Filled 2021-12-05 (×3): qty 1

## 2021-12-05 MED ORDER — ONDANSETRON HCL 4 MG/2ML IJ SOLN
4.0000 mg | Freq: Four times a day (QID) | INTRAMUSCULAR | Status: DC | PRN
Start: 2021-12-05 — End: 2021-12-07
  Administered 2021-12-06: 4 mg via INTRAVENOUS
  Filled 2021-12-05: qty 2

## 2021-12-05 MED ORDER — ONDANSETRON HCL 4 MG PO TABS: 4.0000 mg | ORAL_TABLET | Freq: Four times a day (QID) | ORAL | Status: DC | PRN

## 2021-12-05 MED ORDER — ENOXAPARIN SODIUM 30 MG/0.3ML IJ SOSY
30.0000 mg | PREFILLED_SYRINGE | INTRAMUSCULAR | Status: DC
  Administered 2021-12-05 – 2021-12-07 (×3): 30 mg via SUBCUTANEOUS
  Filled 2021-12-05 (×3): qty 0.3

## 2021-12-05 NOTE — Assessment & Plan Note (Addendum)
No evidence for volume overload. ? ?

## 2021-12-05 NOTE — ED Notes (Addendum)
Endorses HA, dizziness, and nausea. Denies need for medications for sx. Alert, NAD, calm, interactive. L sided deficits noted L sided weakness and LUE drift, verbalizes decreased sensation on L.  ?

## 2021-12-05 NOTE — H&P (Signed)
?History and Physical  ? ? ?Patient: Anne [name] MRN: [id number] DOA: 12/04/2021 ? ?Date of Service: the patient was seen and examined on 12/05/2021 ? ?Patient coming from: Home ? ?Chief Complaint:  ?Chief Complaint  ?Patient presents with  ? Dizziness  ? ? ?HPI:  ? ?76 year old Female with past medical history of coronary artery disease (S/P NSTEMI 03/2020 with DES to LAD), diastolic congestive heart failure (Echo 03/2020 EF 55-60% with G1DD), primary hyperaldosteronism, hypertension, hyperlipidemia who presents to [facility] emergency department at the direction of her primary care provider due to frequent bouts of lightheadedness. ? ?Patient explains that over the past several weeks she has been experiencing episodes of thoracic back pain and lightheadedness.  These episodes seem to occur with exertion or rising from a seated position.  The symptoms seem to be increasing in intensity and are occasionally associated with palpitations.  Patient explains that these periods of palpitations can occur for 2 to 3-minute stretches and may or may not be associated with the episodes of lightheadedness and back discomfort.  Patient is also complaining of associated balance of generalized headaches that did not seem to improve with as needed over-the-counter analgesics.  When asked more about her episodes of thoracic back pain she explains that he had similar symptoms of aching back pain when she was diagnosed with an NSTEMI back in 2021. ? ?Due to patient's persisting symptoms patient presented to [facility] emergency department for evaluation the evening of 3/28 and during that time patient was advised that she may need admission.  She declined however and went home.  The following day the patient followed up with her primary care provider who convinced her to come back to the emergency department for reevaluation. ? ?Upon evaluation in the emergency department troponins have been unremarkable at 6 and  5.  EKG reveals no evidence of dynamic ST segment change.  Due to ongoing symptoms of back discomfort and lightheadedness and concerns for an underlying cardiac ischemic equivalent the hospitalist group has been called to assess the patient for admission the hospital. ? ?Review of Systems: Review of Systems  ?Musculoskeletal:  Positive for back pain.  ?Neurological:  Positive for dizziness and weakness.  ?All other systems reviewed and are negative. ? ? ?Past Medical History:  ?Diagnosis Date  ? Complication of anesthesia   ? slow to wake up  ? Diverticulosis   ? Hiatal hernia   ? Hypercholesterolemia   ? Hypertension   ? Osteopenia   ? Type 2 diabetes mellitus with stage 3a chronic kidney disease, without long-term current use of insulin ([place]) 12/05/2021  ? Vitamin D deficiency   ? ? ?Past Surgical History:  ?Procedure Laterality Date  ? ABDOMINAL HYSTERECTOMY    ? Partial  ? CORONARY STENT INTERVENTION N/A 03/16/2020  ? Procedure: CORONARY STENT INTERVENTION;  Surgeon: Burnell Blanks, MD;  Location: [place];  Service: Cardiovascular;  Laterality: N/A;  ? fibrocystic breast excision    ? LAPAROSCOPIC ILEOCECECTOMY  07/07/2015  ? LAPAROTOMY N/A 07/07/2015  ? Procedure: EXPLORATORY LAPAROTOMY WITH ILEOCECECTOMY ;  Surgeon: Donnie Mesa, MD;  Location: [place];  Service: General;  Laterality: N/A;  ? LEFT HEART CATH AND CORONARY ANGIOGRAPHY N/A 03/16/2020  ? Procedure: LEFT HEART CATH AND CORONARY ANGIOGRAPHY;  Surgeon: Burnell Blanks, MD;  Location: [place] [facility];  Service: Cardiovascular;  Laterality: N/A;  ? TUBAL LIGATION    ? ? ?Social History:  reports that she has never  smoked. She has never used smokeless tobacco. She reports current alcohol use. She reports that she does not use drugs. ? ?Allergies  ?Allergen Reactions  ? Amiloride Other (See Comments)  ?  Hair loss  ? Anesthetics, Amide Other (See Comments)  ?  Lethargic and very slow reanimation lasting a full day or so  ? Other  Other (See Comments)  ?  Very hard to awake from anaesthetic with surgery. ?Acute renal failure, hypokalemia ?Very hard to awake from anaesthetic with surgery.  ? Prilocaine Other (See Comments)  ?  Lethargic and very slow reanimation lasting a full day or so  ? Amlodipine Besylate Swelling  ?  Severe edema / Headaches  ? Asa [Aspirin] Other (See Comments)  ?  Causes stomach issues  ? Augmentin [Amoxicillin-Pot Clavulanate] Other (See Comments)  ?  Pain in hips and legs  ? Cefdinir Nausea And Vomiting  ? Diltiazem Hcl Nausea And Vomiting  ?  Hospitalized when it occurred ?  ? Eplerenone Other (See Comments)  ?  Hair loss  ? Indomethacin Other (See Comments)  ?  esophagitis  ? Naproxen Other (See Comments)  ?  Gastric intolerance  ? Tenex [Guanfacine Hcl] Other (See Comments)  ?  Chronic insomnia ?  ? Triamterene-Hctz Other (See Comments)  ?  Acute renal failure, hypokalemia ?  ? Nebivolol Hcl   ?  Other reaction(s): disoriented  ? Potassium Chloride Other (See Comments)  ?  Severe hair loss, baldness  ? Hytrin [Terazosin Hcl] Other (See Comments)  ?  Pt states she can not take this med but can not relate any side effect or incident that occurred  ? Lisinopril Other (See Comments)  ?  Unknown, pt says she "can't take it", cough/hypotension and thirst  ? ? ?Family History  ?Problem Relation Age of Onset  ? Stroke Mother   ? Hypertension Mother   ? Stroke Brother   ? Cancer Neg Hx   ? Alcohol abuse Neg Hx   ? Early death Neg Hx   ? Hearing loss Neg Hx   ? Heart disease Neg Hx   ? Hyperlipidemia Neg Hx   ? Kidney disease Neg Hx   ? ? ?Prior to Admission medications   ?Medication Sig Start Date End Date Taking? Authorizing Provider  ?B Complex-C (B-COMPLEX WITH VITAMIN C) tablet Take 1 tablet by mouth daily.    [provider]  ?COVID-19 mRNA bivalent vaccine, Pfizer, (PFIZER COVID-19 VAC BIVALENT) injection Inject into the muscle. 06/27/21   Carlyle Basques, MD  ?[employer] ASPIRIN LOW DOSE 81 MG EC tablet TAKE 1  TABLET (81 MG TOTAL) BY MOUTH DAILY. SWALLOW WHOLE. 11/26/21   Buford Dresser, MD  ?GARLIC PO Take 2 tablets by mouth daily.     [provider]  ?Magnesium 500 MG TABS Take 500 tablets by mouth.    [provider]  ?meclizine (ANTIVERT) 25 MG tablet Take 1 tablet (25 mg total) by mouth 3 (three) times daily as needed for dizziness. 12/04/21   Maudie Flakes, MD  ?Multiple Vitamins-Minerals (MULTIVITAMIN WITH MINERALS) tablet Take 1 tablet by mouth daily.    [provider]  ?nitroGLYCERIN (NITROSTAT) 0.4 MG SL tablet Place 1 tablet (0.4 mg total) under the tongue every 5 (five) minutes as needed for chest pain. May repeat 2 times, if 3rd dose needed call 911 03/01/21 10/24/21  Almyra Deforest, PA  ?spironolactone (ALDACTONE) 25 MG tablet Take 25 mg by mouth daily.    [provider]  ?traMADol (ULTRAM) 50 MG tablet Take 1 tablet (50 mg total) by mouth every 6 (six) hours as needed. 08/11/21   Tegeler, Gwenyth Allegra, MD  ? ? ?Physical Exam: ? ?Vitals:  ? 12/04/21 1503 12/04/21 1800  ?BP: (!) 145/68 (!) 169/92  ?Pulse: 77 80  ?Resp: 16 15  ?Temp: 99.3 ?F (37.4 ?C)   ?TempSrc: Oral   ?SpO2: 100% 99%  ? ? ?Constitutional: Awake alert and oriented x3, no associated distress.   ?Skin: no rashes, no lesions, good skin turgor noted. ?Eyes: Pupils are equally reactive to light.  No evidence of scleral icterus or conjunctival pallor.  ?ENMT: Moist mucous membranes noted.  Posterior pharynx clear of any exudate or lesions.   ?Neck: normal, supple, no masses, no thyromegaly.  No evidence of jugular venous distension.   ?Respiratory: clear to auscultation bilaterally, no wheezing, no crackles. Normal respiratory effort. No accessory muscle use.  ?Cardiovascular: Regular rate and rhythm, no murmurs / rubs / gallops. No extremity edema. 2+ pedal pulses. No carotid bruits.  ?Chest:   Nontender without crepitus or deformity.   ?Back:   Nontender without crepitus or deformity. ?Abdomen: Abdomen is  soft and nontender.  No evidence of intra-abdominal masses.  Positive bowel sounds noted in all quadrants.   ?Musculoskeletal: No joint deformity upper and lower extremities. Good ROM, no contractures. Norm

## 2021-12-05 NOTE — Assessment & Plan Note (Addendum)
Patient denies history of diabetes.  No oral hypoglycemic agents listed in her medication list.  This is likely an erroneous entry.  Will delete. ? ?

## 2021-12-05 NOTE — ED Notes (Signed)
Pt back from Echo. Sitting up eating upon entering room.  ?

## 2021-12-05 NOTE — ED Notes (Signed)
Not in room, pt in echo ?

## 2021-12-05 NOTE — Progress Notes (Signed)
?  Echocardiogram ?2D Echocardiogram has been performed. ? ?Anne [name] ?12/05/2021, 9:16 AM ?

## 2021-12-05 NOTE — ED Notes (Signed)
Wheeled patient to the bathroom patient did well 

## 2021-12-05 NOTE — Progress Notes (Signed)
Physical Therapy Evaluation ?Patient Details ?Name: Anne [name] ?MRN: [id number] ?DOB: 1946/01/04 ?Today's Date: 12/05/2021 ? ?History of Present Illness ? 76 year old Female admitted 3/28  presented to [facility] emergency department at the direction of her primary care provider due to frequent bouts of lightheadedness/spinning. PMH:  coronary artery disease (S/P NSTEMI 03/2020 with DES to LAD), diastolic congestive heart failure (Echo 03/2020 EF 55-60% with G1DD), primary hyperaldosteronism, hypertension, hyperlipidemia  ?Clinical Impression ? Pt admitted with above diagnosis. Pt assessed for vestibular issues as below.  Suspect vestibular neuritis or labyrinthitis given pts constant dizziness.  She also had a recent ear infection with hearing loss in Jan per pt.  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  Pt understands how to complete exercises. Did discuss that pt may need to rent a wheelchair if she goes home soon with husband's assist.  This would allow her to improve her vestibular system until she can progress to safe ambulation. Will follow pt.  Pt currently with functional limitations due to the deficits listed below (see PT Problem List). Pt will benefit from skilled PT to increase their independence and safety with mobility to allow discharge to the venue listed below.      ?   ? ?Recommendations for follow up therapy are one component of a multi-disciplinary discharge planning process, led by the attending physician.  Recommendations may be updated based on patient status, additional functional criteria and insurance authorization. ? ?Follow Up Recommendations Home health PT (Vestibular rehab with transition to Outpt.) ? ?  ?Assistance Recommended at Discharge Frequent or constant Supervision/Assistance  ?Patient can return home with the following ? A little help with walking and/or transfers;A little help with bathing/dressing/bathroom;Assistance with  cooking/housework;Help with stairs or ramp for entrance ? ?  ?Equipment Recommendations Wheelchair (measurements PT);Wheelchair cushion (measurements PT)  ?Recommendations for Other Services ?    ?  ?Functional Status Assessment Patient has had a recent decline in their functional status and demonstrates the ability to make significant improvements in function in a reasonable and predictable amount of time.  ? ?  ?Precautions / Restrictions Precautions ?Precautions: Fall ?Restrictions ?Weight Bearing Restrictions: No  ? ?  ? ?Mobility ? Bed Mobility ?Overal bed mobility: Needs Assistance ?Bed Mobility: Supine to Sit ?  ?  ?Supine to sit: Min guard ?  ?  ?General bed mobility comments: Able to come to edge of stretcher wih min guard assist. ?  ? ?Transfers ?  ?  ?  ?  ?  ?  ?  ?  ?  ?General transfer comment: Did not stand as did assessment with pt sitting eOB. ?  ? ?Ambulation/Gait ?  ?  ?  ?  ?  ?  ?  ?  ? ?Stairs ?  ?  ?  ?  ?  ? ?Wheelchair Mobility ?  ? ?Modified Rankin (Stroke Patients Only) ?  ? ?  ? ?Balance Overall balance assessment: Needs assistance ?Sitting-balance support: No upper extremity supported, Feet supported ?Sitting balance-Leahy Scale: Fair ?  ?  ?  ?  ?  ?  ?  ?  ?  ?  ?  ?  ?  ?  ?  ?  ?   ? ? ? ?Pertinent Vitals/Pain Pain Assessment ?Pain Assessment: No/denies pain  ? ? ?Home Living Family/patient expects to be discharged to:: Private residence ?Living Arrangements: Spouse/significant other ?Available Help at Discharge: Family;Available 24 hours/day (husband, son and  granddaughter) ?Type of Home: House ?Home Access: Stairs to enter ?Entrance Stairs-Rails: Right;Left;Can reach both ?Entrance Stairs-Number of Steps: 3 ?  ?Home Layout: One level ?Home Equipment: Grab bars - tub/shower;Rollator (4 wheels) ?   ?  ?Prior Function Prior Level of Function : Needs assist ?  ?  ?  ?Physical Assist : Mobility (physical);ADLs (physical) ?Mobility (physical): Bed mobility;Transfers;Gait;Stairs ?ADLs  (physical): Bathing;Dressing;Toileting ?Mobility Comments: Has been needing assist for last 3 weeks due to dizziness. Independent without device prior to this. ?ADLs Comments: Has been needing assist last 3 weeks due to dizziness.  Independent prior to this. ?  ? ? ?Hand Dominance  ? Dominant Hand: Right ? ?  ?Extremity/Trunk Assessment  ? Upper Extremity Assessment ?Upper Extremity Assessment: LUE deficits/detail ?LUE Sensation: decreased light touch;decreased proprioception ?  ? ?Lower Extremity Assessment ?Lower Extremity Assessment: Overall WFL for tasks assessed ?  ? ?Cervical / Trunk Assessment ?Cervical / Trunk Assessment: Normal  ?Communication  ? Communication: No difficulties  ?Cognition Arousal/Alertness: Awake/alert ?Behavior During Therapy: [facility] for tasks assessed/performed ?Overall Cognitive Status: Within Functional Limits for tasks assessed ?  ?  ?  ?  ?  ?  ?  ?  ? Vertigo assessment ? 12/05/21 0001  ?Symptom Behavior  ?Subjective history of current problem Pt reports constant dizziness for last 3 weeks.  ?Type of Dizziness  Imbalance;Spinning;Lightheadedness;Vertigo  ?Frequency of Dizziness constant  ?Symptom Nature Constant  ?Aggravating Factors Not applicable  ?Relieving Factors No known relieving factors  ?Progression of Symptoms No change since onset  ?History of similar episodes Reports ear infection with loss of hearing in Jan. Was treated and hearing returned.  ?Oculomotor Exam  ?Oculomotor Alignment Normal  ?Spontaneous Absent  ?Gaze-induced  Left beating nystagmus with R gaze;Left beating nystagmus with L gaze  ?Smooth Pursuits Intact  ?Saccades Slow  ?Vestibulo-Ocular Reflex  ?VOR 1 Head Only (x 1 viewing) Positive right >left  ?Cognition  ?Cognition Orientation Level Oriented x 4  ?Orthostatics  ?BP supine (x 5 minutes) 162/97  ?BP sitting 137/80  ? ?  ?  ?  ?  ?  ?  ?  ?  ?  ?  ? ?  ?General Comments   ? ?  ?Exercises Other Exercises ?Other Exercises: x 1 exercises head shaking and  nodding 2 sets of each, 5 x day  ? ?Assessment/Plan  ?  ?PT Assessment Patient needs continued PT services  ?PT Problem List Decreased activity tolerance;Decreased balance;Decreased mobility;Decreased knowledge of use of DME;Decreased safety awareness;Decreased knowledge of precautions (dizziness) ? ?   ?  ?PT Treatment Interventions DME instruction;Gait training;Functional mobility training;Therapeutic activities;Therapeutic exercise;Balance training;Patient/family education;Stair training (vestibular rehab)   ? ?PT Goals (Current goals can be found in the Care Plan section)  ?Acute Rehab PT Goals ?Patient Stated Goal: to go home ?PT Goal Formulation: With patient ?Time For Goal Achievement: 12/19/21 ?Potential to Achieve Goals: Good ? ?  ?Frequency Min 4X/week ?  ? ? ?Co-evaluation   ?  ?  ?  ?  ? ? ?  ?AM-PAC PT "6 Clicks" Mobility  ?Outcome Measure Help needed turning from your back to your side while in a flat bed without using bedrails?: None ?Help needed moving from lying on your back to sitting on the side of a flat bed without using bedrails?: A Little ?Help needed moving to and from a bed to a chair (including a wheelchair)?: A Lot ?Help needed standing up from a chair using your arms (e.g., wheelchair or bedside chair)?:  A Lot ?Help needed to walk in hospital room?: A Lot ?Help needed climbing 3-5 steps with a railing? : A Lot ?6 Click Score: 15 ? ?  ?End of Session   ?Activity Tolerance: Patient limited by fatigue;Other (comment) (dizziness) ?Patient left: with call bell/phone within reach;in bed ?Nurse Communication: Mobility status ?PT Visit Diagnosis: Unsteadiness on feet (R26.81);Dizziness and giddiness (R42) ?  ? ?Time: 8472-0721 ?PT Time Calculation (min) (ACUTE ONLY): 36 min ? ? ?Charges:   PT Evaluation ?$PT Eval Moderate Complexity: 1 Mod ?PT Treatments ?$Therapeutic Exercise: 8-22 mins ?  ?   ? ? ?Yanis Juma M,PT ?Acute Rehab Services ?[phone] ?[phone] (pager)  ? ?Anne [name] ?12/05/2021,  2:26 PM ?

## 2021-12-05 NOTE — Assessment & Plan Note (Addendum)
Symptoms unlikely to be cardiac in etiology.  Cardiac status is stable.  Echocardiogram reassuring. ?

## 2021-12-05 NOTE — Assessment & Plan Note (Addendum)
Renal function is stable.

## 2021-12-05 NOTE — Hospital Course (Signed)
76 year old Female with past medical history of coronary artery disease (S/P NSTEMI 03/2020 with DES to LAD), diastolic congestive heart failure (Echo 03/2020 EF 55-60% with G1DD), primary hyperaldosteronism, hypertension, hyperlipidemia who presented to [facility] emergency department at the direction of her primary care provider due to frequent bouts of lightheadedness.  Associated with palpitations.  Hospitalized for further management. ?

## 2021-12-05 NOTE — Assessment & Plan Note (Addendum)
Symptoms thought to be secondary to vertigo though they are not classically suggestive of the same. ?She does have left-sided weakness which is chronic per patient. ?MRI brain did not show any acute infarct.  MRA head and neck does not show any stenosis especially in the posterior circulation.  ?Orthostatic vital signs were unremarkable. Troponins are normal. ?Echocardiogram shows normal systolic function with EF of 55 to 60%.  No regional wall motion abnormality noted.  No significant valvular disease noted. ?Patient was started on meclizine.  Low-dose Valium was added as well.  Seen by PT OT.  Improved.  Home health has been ordered at discharge.  ?

## 2021-12-05 NOTE — Progress Notes (Signed)
? ?[facility] ?PROGRESS NOTE ? ? ?Anne [name] NGE:[id number] DOB: 06-25-1946 DOA: 12/04/2021  0 ?DOS: the patient was seen and examined on 12/05/2021 ? ?PCP: Johna Roles, PA ? ?Brief History and Hospital Course:  ?76 year old Female with past medical history of coronary artery disease (S/P NSTEMI 03/2020 with DES to LAD), diastolic congestive heart failure (Echo 03/2020 EF 55-60% with G1DD), primary hyperaldosteronism, hypertension, hyperlipidemia who presented to [facility] emergency department at the direction of her primary care provider due to frequent bouts of lightheadedness.  Associated with palpitations.  Hospitalized for further management. ? ?Consultants: None ? ?Procedures: Transthoracic echocardiogram ? ? ? ?Subjective: ?Patient mentions that she continues to have this feeling of "dizziness".  Almost passed out at home.  Denies spinning sensation.  Did have some ear issue back in January. ? ? ? ?Assessment/Plan: ? ? ?* Near syncope ?Symptoms thought to be secondary to vertigo though they are not classically suggestive of the same. ?She does have left-sided weakness which is chronic per patient. ?MRI brain did not show any acute infarct.  MRA head and neck does not show any stenosis especially in the posterior circulation.  Orthostatic vital signs is pending.  Troponins are normal. ?Echocardiogram shows normal systolic function with EF of 55 to 60%.  No regional wall motion abnormality noted.  No significant valvular disease noted. ?Continue meclizine.  PT evaluation. ?Cortisol level to be checked. ? ?Coronary artery disease involving native coronary artery of native heart ?Symptoms unlikely to be cardiac in etiology.  Cardiac status is stable.  May resume home medications. ? ?Chronic kidney disease, stage 3a ([place]) ?Renal function is stable ? ? ?Type 2 diabetes mellitus with stage 3a chronic kidney disease, without long-term current use of insulin ([place]) ?Monitor CBGs.   SSI. ? ? ?Chronic diastolic CHF (congestive heart failure) ([place]) ?No evidence for volume overload. ? ? ? ? ? ? ?DVT Prophylaxis: Lovenox ?Code Status: Full code ?Family Communication: Discussed with patient ?Disposition Plan: PT evaluation.  Hopefully return home when improved ? ? ? ? ? ? ?Medications: Scheduled: ? aspirin EC  81 mg Oral Daily  ? enoxaparin (LOVENOX) injection  30 mg Subcutaneous Q24H  ? insulin aspart  0-15 Units Subcutaneous TID AC & HS  ? meclizine  25 mg Oral TID  ? spironolactone  25 mg Oral Daily  ? ?Continuous: ?MWN:[id number] **OR** acetaminophen, ondansetron **OR** ondansetron (ZOFRAN) IV, polyethylene glycol ? ?Antibiotics: ?Anti-infectives (From admission, onward)  ? ? None  ? ?  ? ? ?Objective: ? ?Vital Signs ? ?Vitals:  ? 12/05/21 1015 12/05/21 1030 12/05/21 1130 12/05/21 1200  ?BP: 132/85 (!) 170/89 137/80 (!) 152/79  ?Pulse: 76 94 72 69  ?Resp:   19 19  ?Temp:      ?TempSrc:      ?SpO2: 99% 100% 99% 99%  ? ?No intake or output data in the 24 hours ending 12/05/21 1237 ?There were no vitals filed for this visit. ? ?General appearance: Awake alert.  In no distress ?Resp: Clear to auscultation bilaterally.  Normal effort ?Cardio: S1-S2 is normal regular.  No S3-S4.  No rubs murmurs or bruit ?GI: Abdomen is soft.  Nontender nondistended.  Bowel sounds are present normal.  No masses organomegaly ?Extremities: No edema.  Full range of motion of lower extremities. ?Neurologic: Alert and oriented x3.  Mild left-sided weakness noted. ? ? ? ?Lab Results: ? ?Data Reviewed: I have personally reviewed labs and imaging study reports ? ?CBC: ?Recent Labs  ?Lab  12/03/21 ?1719 12/04/21 ?1515 12/05/21 ?0250  ?WBC 4.6 4.0 3.4*  ?NEUTROABS  --   --  1.7  ?HGB 12.3 13.0 11.2*  ?HCT 36.8 37.9 32.0*  ?MCV 84.4 85.4 84.4  ?PLT 202 225 185  ? ? ?Basic Metabolic Panel: ?Recent Labs  ?Lab 12/03/21 ?1719 12/04/21 ?1515 12/05/21 ?0211  ?NA 139 139 138  ?K 4.3 4.0 3.6  ?CL 103 104 107  ?CO2 '29 29 22  '$ ?GLUCOSE  88 139* 95  ?BUN '15 11 13  '$ ?CREATININE 1.02* 1.05* 0.94  ?CALCIUM 9.9 9.6 9.0  ?MG  --   --  1.9  ? ? ?GFR: ?Estimated Creatinine Clearance: 47.5 mL/min (by C-G formula based on SCr of 0.94 mg/dL). ? ?Liver Function Tests: ?Recent Labs  ?Lab 12/05/21 ?0211  ?AST 20  ?ALT 14  ?ALKPHOS 45  ?BILITOT 1.1  ?PROT 6.2*  ?ALBUMIN 3.5  ? ? ? ?HbA1C: ?Recent Labs  ?  12/05/21 ?0250  ?HGBA1C 5.9*  ? ? ?CBG: ?Recent Labs  ?Lab 12/05/21 ?0755 12/05/21 ?1210  ?GLUCAP 93 106*  ? ? ?Lipid Profile: ?Recent Labs  ?  12/05/21 ?0211  ?CHOL 164  ?HDL 60  ?[place] 98  ?TRIG 31  ?CHOLHDL 2.7  ? ? ? ?Recent Results (from the past 240 hour(s))  ?Resp Panel by RT-PCR (Flu A&B, Covid) Nasopharyngeal Swab     Status: None  ? Collection Time: 12/04/21  3:16 PM  ? Specimen: Nasopharyngeal Swab; Nasopharyngeal(NP) swabs in vial transport medium  ?Result Value Ref Range Status  ? SARS Coronavirus 2 by RT PCR NEGATIVE NEGATIVE Final  ?  Comment: (NOTE) ?SARS-CoV-2 target nucleic acids are NOT DETECTED. ? ?The SARS-CoV-2 RNA is generally detectable in upper respiratory ?specimens during the acute phase of infection. The lowest ?concentration of SARS-CoV-2 viral copies this assay can detect is ?138 copies/mL. A negative result does not preclude SARS-Cov-2 ?infection and should not be used as the sole basis for treatment or ?other patient management decisions. A negative result may occur with  ?improper specimen collection/handling, submission of specimen other ?than nasopharyngeal swab, presence of viral mutation(s) within the ?areas targeted by this assay, and inadequate number of viral ?copies(<138 copies/mL). A negative result must be combined with ?clinical observations, patient history, and epidemiological ?information. The expected result is Negative. ? ?Fact Sheet for Patients:  ?EntrepreneurPulse.com.au ? ?Fact Sheet for Healthcare Providers:  ?IncredibleEmployment.be ? ?This test is no t yet approved or  cleared by the [place] FDA and  ?has been authorized for detection and/or diagnosis of SARS-CoV-2 by ?FDA under an Emergency Use Authorization (EUA). This EUA will remain  ?in effect (meaning this test can be used) for the duration of the ?COVID-19 declaration under Section 564(b)(1) of the Act, 21 ?U.S.C.section 360bbb-3(b)(1), unless the authorization is terminated  ?or revoked sooner.  ? ? ?  ? Influenza A by PCR NEGATIVE NEGATIVE Final  ? Influenza B by PCR NEGATIVE NEGATIVE Final  ?  Comment: (NOTE) ?The Xpert Xpress SARS-CoV-2/FLU/RSV plus assay is intended as an aid ?in the diagnosis of influenza from Nasopharyngeal swab specimens and ?should not be used as a sole basis for treatment. Nasal washings and ?aspirates are unacceptable for Xpert Xpress SARS-CoV-2/FLU/RSV ?testing. ? ?Fact Sheet for Patients: ?EntrepreneurPulse.com.au ? ?Fact Sheet for Healthcare Providers: ?IncredibleEmployment.be ? ?This test is not yet approved or cleared by the [place] FDA and ?has been authorized for detection and/or diagnosis of SARS-CoV-2 by ?FDA under an Emergency Use Authorization (EUA). This EUA will remain ?  in effect (meaning this test can be used) for the duration of the ?COVID-19 declaration under Section 564(b)(1) of the Act, 21 U.S.C. ?section 360bbb-3(b)(1), unless the authorization is terminated or ?revoked. ? ?Performed at [facility], [place] [address], [place], [place] ?[address] ?  ?  ? ? ?Radiology Studies: ?CT ANGIO HEAD NECK W WO CM ? ?Result Date: 12/04/2021 ?CLINICAL DATA:  Initial evaluation for neuro deficit, stroke suspected. EXAM: CT ANGIOGRAPHY HEAD AND NECK TECHNIQUE: Multidetector CT imaging of the head and neck was performed using the standard protocol during bolus administration of intravenous contrast. Multiplanar CT image reconstructions and MIPs were obtained to evaluate the vascular anatomy. Carotid stenosis measurements (when applicable) are  obtained utilizing NASCET criteria, using the distal internal carotid diameter as the denominator. RADIATION DOSE REDUCTION: This exam was performed according to the departmental dose-optimization program which includ

## 2021-12-06 DIAGNOSIS — Z955 Presence of coronary angioplasty implant and graft: Secondary | ICD-10-CM | POA: Diagnosis not present

## 2021-12-06 DIAGNOSIS — Z823 Family history of stroke: Secondary | ICD-10-CM | POA: Diagnosis not present

## 2021-12-06 DIAGNOSIS — R55 Syncope and collapse: Secondary | ICD-10-CM | POA: Diagnosis present

## 2021-12-06 DIAGNOSIS — Z9049 Acquired absence of other specified parts of digestive tract: Secondary | ICD-10-CM | POA: Diagnosis not present

## 2021-12-06 DIAGNOSIS — I130 Hypertensive heart and chronic kidney disease with heart failure and stage 1 through stage 4 chronic kidney disease, or unspecified chronic kidney disease: Secondary | ICD-10-CM | POA: Diagnosis present

## 2021-12-06 DIAGNOSIS — G8194 Hemiplegia, unspecified affecting left nondominant side: Secondary | ICD-10-CM | POA: Diagnosis present

## 2021-12-06 DIAGNOSIS — N1831 Chronic kidney disease, stage 3a: Secondary | ICD-10-CM | POA: Diagnosis present

## 2021-12-06 DIAGNOSIS — M546 Pain in thoracic spine: Secondary | ICD-10-CM | POA: Diagnosis present

## 2021-12-06 DIAGNOSIS — Z888 Allergy status to other drugs, medicaments and biological substances status: Secondary | ICD-10-CM | POA: Diagnosis not present

## 2021-12-06 DIAGNOSIS — E7800 Pure hypercholesterolemia, unspecified: Secondary | ICD-10-CM | POA: Diagnosis present

## 2021-12-06 DIAGNOSIS — I5032 Chronic diastolic (congestive) heart failure: Secondary | ICD-10-CM | POA: Diagnosis present

## 2021-12-06 DIAGNOSIS — E269 Hyperaldosteronism, unspecified: Secondary | ICD-10-CM | POA: Diagnosis present

## 2021-12-06 DIAGNOSIS — E1122 Type 2 diabetes mellitus with diabetic chronic kidney disease: Secondary | ICD-10-CM | POA: Diagnosis present

## 2021-12-06 DIAGNOSIS — Z886 Allergy status to analgesic agent status: Secondary | ICD-10-CM | POA: Diagnosis not present

## 2021-12-06 DIAGNOSIS — Z8249 Family history of ischemic heart disease and other diseases of the circulatory system: Secondary | ICD-10-CM | POA: Diagnosis not present

## 2021-12-06 DIAGNOSIS — H538 Other visual disturbances: Secondary | ICD-10-CM | POA: Diagnosis present

## 2021-12-06 DIAGNOSIS — R42 Dizziness and giddiness: Secondary | ICD-10-CM | POA: Diagnosis present

## 2021-12-06 DIAGNOSIS — I2510 Atherosclerotic heart disease of native coronary artery without angina pectoris: Secondary | ICD-10-CM | POA: Diagnosis present

## 2021-12-06 DIAGNOSIS — I252 Old myocardial infarction: Secondary | ICD-10-CM | POA: Diagnosis not present

## 2021-12-06 DIAGNOSIS — Z20822 Contact with and (suspected) exposure to covid-19: Secondary | ICD-10-CM | POA: Diagnosis present

## 2021-12-06 DIAGNOSIS — Z881 Allergy status to other antibiotic agents status: Secondary | ICD-10-CM | POA: Diagnosis not present

## 2021-12-06 DIAGNOSIS — E2740 Unspecified adrenocortical insufficiency: Secondary | ICD-10-CM | POA: Diagnosis present

## 2021-12-06 DIAGNOSIS — Z7982 Long term (current) use of aspirin: Secondary | ICD-10-CM | POA: Diagnosis not present

## 2021-12-06 DIAGNOSIS — E2609 Other primary hyperaldosteronism: Secondary | ICD-10-CM | POA: Diagnosis present

## 2021-12-06 LAB — GLUCOSE, CAPILLARY: Glucose-Capillary: 89 mg/dL (ref 70–99)

## 2021-12-06 MED ORDER — COSYNTROPIN 0.25 MG IJ SOLR
0.2500 mg | Freq: Once | INTRAMUSCULAR | Status: AC
  Administered 2021-12-07: 0.25 mg via INTRAVENOUS
  Filled 2021-12-06: qty 0.25

## 2021-12-06 MED ORDER — DIAZEPAM 2 MG PO TABS
2.0000 mg | ORAL_TABLET | Freq: Two times a day (BID) | ORAL | Status: DC
  Administered 2021-12-06 – 2021-12-07 (×3): 2 mg via ORAL
  Filled 2021-12-06 (×3): qty 1

## 2021-12-06 NOTE — Evaluation (Addendum)
Occupational Therapy Evaluation ?Patient Details ?Name: Anne [name] ?MRN: [id number] ?DOB: March 23, 1946 ?Today's Date: 12/06/2021 ? ? ?History of Present Illness 76 year old Female admitted 3/28  presented to [facility] emergency department at the direction of her primary care provider due to frequent bouts of lightheadedness/spinning. PMH:  coronary artery disease (S/P NSTEMI 03/2020 with DES to LAD), diastolic congestive heart failure (Echo 03/2020 EF 55-60% with G1DD), primary hyperaldosteronism, hypertension, hyperlipidemia  ? ?Clinical Impression ?  ?Pt at this time was able to complete UE ADLS while sitting with set up and LE with min guard to min assistance with lateral leaning while sitting. Pt however, when attempting to complete transfer required min assistance to walker but unable to stand to a minute as started to feel dizzy and required to sit back down. Pt reports that her husband can assist with some tasks with the return to home will not be able to lift her and children work during the day. Pt currently with functional limitations due to the deficits listed below (see OT Problem List).  Pt will benefit from skilled OT to increase their safety and independence with ADL and functional mobility for ADL to facilitate discharge to venue listed below.  ?  ? BP sitting 125/74 ?Standing: 141/92 ?Sitting: 127/79  ? ?Recommendations for follow up therapy are one component of a multi-disciplinary discharge planning process, led by the attending physician.  Recommendations may be updated based on patient status, additional functional criteria and insurance authorization.  ? ?Follow Up Recommendations ?  (HH versus SNF as pt can opperate in a seated level but needs assistance with transfers and self reports husband can not assist with lifting as had recent back sx)  ?  ?Assistance Recommended at Discharge Intermittent Supervision/Assistance  ?Patient can return home with the following Help with stairs or  ramp for entrance;Assistance with cooking/housework;A little help with bathing/dressing/bathroom;A little help with walking and/or transfers ? ?  ?Functional Status Assessment ? Patient has had a recent decline in their functional status and demonstrates the ability to make significant improvements in function in a reasonable and predictable amount of time.  ?Equipment Recommendations ? Wheelchair (measurements OT);Wheelchair cushion (measurements OT);Tub/shower seat  ?  ?Recommendations for Other Services   ? ? ?  ?Precautions / Restrictions Precautions ?Precautions: Fall ?Restrictions ?Weight Bearing Restrictions: No  ? ?  ? ?Mobility Bed Mobility ?Overal bed mobility:  (Pt presented in chair) ?  ?  ?  ?  ?  ?  ?  ?  ? ?Transfers ?Overall transfer level: Needs assistance ?Equipment used: Rolling walker (2 wheels) ?Transfers: Sit to/from Stand ?Sit to Stand: Min assist ?  ?  ?  ?  ?  ?General transfer comment: Pt once in standing limited tolerance due to increase in dizziness with transfers and started to go into a flexed position ?  ? ?  ?Balance Overall balance assessment: Needs assistance ?Sitting-balance support: No upper extremity supported, Feet supported ?Sitting balance-Leahy Scale: Fair ?  ?  ?Standing balance support: Bilateral upper extremity supported ?Standing balance-Leahy Scale: Poor ?Standing balance comment: Pt unable to let go of walker or chair, pt very fearful of ambulation ?  ?  ?  ?  ?  ?  ?  ?  ?  ?  ?  ?   ? ?ADL either performed or assessed with clinical judgement  ? ?ADL Overall ADL's : Needs assistance/impaired ?Eating/Feeding: Independent;Sitting ?  ?Grooming: Wash/dry hands;Wash/dry face;Set up ?  ?Upper Body Bathing: Set up;Cueing  for safety;Cueing for sequencing;Sitting ?  ?Lower Body Bathing: Minimal assistance;Cueing for safety;Cueing for sequencing;Sitting/lateral leans ?  ?Upper Body Dressing : Set up;Sitting ?  ?Lower Body Dressing: Minimal assistance;Sitting/lateral leans ?   ?Toilet Transfer: Moderate assistance;Squat-pivot ?  ?Toileting- Clothing Manipulation and Hygiene: Moderate assistance;Sitting/lateral lean ?  ?  ?  ?  ?General ADL Comments: Pt reporting they can not walk at this time  ? ? ? ?Vision   ?   ?   ?Perception   ?  ?Praxis   ?  ? ?Pertinent Vitals/Pain Pain Assessment ?Pain Assessment: No/denies pain  ? ? ? ?Hand Dominance Right ?  ?Extremity/Trunk Assessment Upper Extremity Assessment ?Upper Extremity Assessment: LUE deficits/detail ?LUE Deficits / Details: decrease in strength but reported L side has been weaker then L side ?LUE Sensation: decreased light touch;decreased proprioception ?  ?Lower Extremity Assessment ?Lower Extremity Assessment: Defer to PT evaluation ?  ?Cervical / Trunk Assessment ?Cervical / Trunk Assessment: Normal ?  ?Communication Communication ?Communication: No difficulties ?  ?Cognition Arousal/Alertness: Awake/alert ?Behavior During Therapy: [facility] for tasks assessed/performed ?Overall Cognitive Status: Within Functional Limits for tasks assessed ?  ?  ?  ?  ?  ?  ?  ?  ?  ?  ?  ?  ?  ?  ?  ?  ?  ?  ?  ?General Comments    ? ?  ?Exercises   ?  ?Shoulder Instructions    ? ? ?Home Living Family/patient expects to be discharged to:: Private residence ?Living Arrangements: Spouse/significant other ?Available Help at Discharge: Family;Available 24 hours/day ?Type of Home: House ?Home Access: Stairs to enter ?Entrance Stairs-Number of Steps: 3 ?Entrance Stairs-Rails: Right;Left;Can reach both ?Home Layout: One level ?  ?  ?Bathroom Shower/Tub: Tub/shower unit;Door ?  ?Bathroom Toilet: Standard ?  ?  ?Home Equipment: Grab bars - tub/shower;Rollator (4 wheels) ?  ?  ?  ? ?  ?Prior Functioning/Environment Prior Level of Function : Needs assist ?  ?  ?  ?Physical Assist : Mobility (physical);ADLs (physical) ?Mobility (physical): Bed mobility;Transfers;Gait;Stairs ?ADLs (physical): Bathing;Dressing;Toileting ?Mobility Comments: Has been needing assist for  last 3 weeks due to dizziness. Independent without device prior to this. ?ADLs Comments: Has been needing assist last 3 weeks due to dizziness.  Independent prior to this. ?  ? ?  ?  ?OT Problem List: Decreased strength;Decreased activity tolerance;Impaired balance (sitting and/or standing);Decreased safety awareness;Decreased knowledge of use of DME or AE;Cardiopulmonary status limiting activity ?  ?   ?OT Treatment/Interventions: Self-care/ADL training;DME and/or AE instruction;Therapeutic activities;Patient/family education;Balance training  ?  ?OT Goals(Current goals can be found in the care plan section) Acute Rehab OT Goals ?Patient Stated Goal: to increase in ability to stand to be able to return home ?OT Goal Formulation: With patient ?Time For Goal Achievement: 12/20/21 ?Potential to Achieve Goals: Good ?ADL Goals ?Pt Will Perform Grooming: Independently;standing ?Pt Will Perform Lower Body Bathing: with modified independence;sitting/lateral leans ?Pt Will Perform Lower Body Dressing: with modified independence;sitting/lateral leans ?Pt Will Transfer to Toilet: with modified independence;ambulating ?Pt Will Perform Tub/Shower Transfer: with modified independence;ambulating  ?OT Frequency: Min 2X/week ?  ? ?Co-evaluation   ?  ?  ?  ?  ? ?  ?AM-PAC OT "6 Clicks" Daily Activity     ?Outcome Measure Help from another person eating meals?: None ?Help from another person taking care of personal grooming?: A Little ?Help from another person toileting, which includes using toliet, bedpan, or urinal?: A Lot ?Help from another  person bathing (including washing, rinsing, drying)?: A Little ?Help from another person to put on and taking off regular upper body clothing?: A Little ?Help from another person to put on and taking off regular lower body clothing?: A Little ?6 Click Score: 18 ?  ?End of Session Equipment Utilized During Treatment: Gait belt;Rolling walker (2 wheels) ?Nurse Communication: Mobility  status ? ?Activity Tolerance:  (limited by dizziness) ?Patient left: in chair;with call bell/phone within reach ? ?OT Visit Diagnosis: Unsteadiness on feet (R26.81);Other abnormalities of gait and mobility (R26.89);M

## 2021-12-06 NOTE — Progress Notes (Signed)
? ?[facility] ?PROGRESS NOTE ? ? ?Anne [name] UTM:[id number] DOB: 01-Apr-1946 DOA: 12/04/2021  0 ?DOS: the patient was seen and examined on 12/06/2021 ? ?PCP: Johna Roles, PA ? ?Brief History and Hospital Course:  ?76 year old Female with past medical history of coronary artery disease (S/P NSTEMI 03/2020 with DES to LAD), diastolic congestive heart failure (Echo 03/2020 EF 55-60% with G1DD), primary hyperaldosteronism, hypertension, hyperlipidemia who presented to [facility] emergency department at the direction of her primary care provider due to frequent bouts of lightheadedness.  Associated with palpitations.  Hospitalized for further management. ? ?Consultants: None ? ?Procedures: Transthoracic echocardiogram ? ? ? ?Subjective: ?Patient sitting on the chair but continues to have significant symptoms of "dizziness".  Feels unsteady even while sitting on the chair.  Has not worked with physical therapy yet.  Open to trying Valium.   ? ? ? ?Assessment/Plan: ? ? ?* Near syncope ?Symptoms thought to be secondary to vertigo though they are not classically suggestive of the same. ?She does have left-sided weakness which is chronic per patient. ?MRI brain did not show any acute infarct.  MRA head and neck does not show any stenosis especially in the posterior circulation.  ?Orthostatic vital signs were unremarkable. Troponins are normal. ?Echocardiogram shows normal systolic function with EF of 55 to 60%.  No regional wall motion abnormality noted.  No significant valvular disease noted. ?Patient was started on meclizine.  PT vestibular evaluation was ordered.  We will also go ahead with PT and OT evaluation for mobility.  Patient continues to have significant symptoms.  Feels unsteady.  Afraid of falling.  Will add low-dose Valium. ?Cortisol level noted to be low.  We will do a cosyntropin stimulation test. ? ?Coronary artery disease involving native coronary artery of native heart ?Symptoms  unlikely to be cardiac in etiology.  Cardiac status is stable.  Echocardiogram reassuring. ? ?Chronic kidney disease, stage 3a ([place]) ?Renal function is stable.  Recheck labs tomorrow. ? ? ?Type 2 diabetes mellitus with stage 3a chronic kidney disease, without long-term current use of insulin ([place]) ?Patient denies history of diabetes.  No oral hypoglycemic agents listed in her medication list.  This is likely an erroneous entry.  Will delete. ? ? ?Chronic diastolic CHF (congestive heart failure) ([place]) ?No evidence for volume overload. ? ? ?Hyperaldosteronism ([place]) ?Patient mentions that this was diagnosed about 3 years ago by an endocrinologist.  This is the reason patient is on spironolactone. ? ? ? ? ? ?DVT Prophylaxis: Lovenox ?Code Status: Full code ?Family Communication: Discussed with patient ?Disposition Plan: Continues to have significant symptoms.  Waiting on PT and OT evaluation.  Treatment will be adjusted. ? ? ? ? ? ? ?Medications: Scheduled: ? aspirin EC  81 mg Oral Daily  ? [START ON 12/07/2021] cosyntropin  0.25 mg Intravenous Once  ? enoxaparin (LOVENOX) injection  30 mg Subcutaneous Q24H  ? meclizine  25 mg Oral TID  ? spironolactone  25 mg Oral Daily  ? ?Continuous: ?FKC:LEXNTZGYFVCBS **OR** acetaminophen, ondansetron **OR** ondansetron (ZOFRAN) IV, polyethylene glycol ? ?Antibiotics: ?Anti-infectives (From admission, onward)  ? ? None  ? ?  ? ? ?Objective: ? ?Vital Signs ? ?Vitals:  ? 12/06/21 0000 12/06/21 0245 12/06/21 0300 12/06/21 0809  ?BP: 121/78  (!) 143/90 124/70  ?Pulse: 66   80  ?Resp:   13 14  ?Temp:   98.4 ?F (36.9 ?C) 98.5 ?F (36.9 ?C)  ?TempSrc:   Oral Oral  ?SpO2: 98%  98% 100%  ?  Weight:  56.3 kg    ? ? ?Intake/Output Summary (Last 24 hours) at 12/06/2021 0950 ?Last data filed at 12/06/2021 0715 ?Gross per 24 hour  ?Intake 120 ml  ?Output 600 ml  ?Net -480 ml  ? ?Filed Weights  ? 12/06/21 0245  ?Weight: 56.3 kg  ? ? ?General appearance: Awake alert.  In no distress ?Resp: Clear to  auscultation bilaterally.  Normal effort ?Cardio: S1-S2 is normal regular.  No S3-S4.  No rubs murmurs or bruit ?GI: Abdomen is soft.  Nontender nondistended.  Bowel sounds are present normal.  No masses organomegaly ?Extremities: No edema.  Moving all of her extremities ? ? ? ? ?Lab Results: ? ?Data Reviewed: I have personally reviewed labs and imaging study reports ? ?CBC: ?Recent Labs  ?Lab 12/03/21 ?1719 12/04/21 ?1515 12/05/21 ?0250  ?WBC 4.6 4.0 3.4*  ?NEUTROABS  --   --  1.7  ?HGB 12.3 13.0 11.2*  ?HCT 36.8 37.9 32.0*  ?MCV 84.4 85.4 84.4  ?PLT 202 225 185  ? ? ?Basic Metabolic Panel: ?Recent Labs  ?Lab 12/03/21 ?1719 12/04/21 ?1515 12/05/21 ?0211  ?NA 139 139 138  ?K 4.3 4.0 3.6  ?CL 103 104 107  ?CO2 '29 29 22  '$ ?GLUCOSE 88 139* 95  ?BUN '15 11 13  '$ ?CREATININE 1.02* 1.05* 0.94  ?CALCIUM 9.9 9.6 9.0  ?MG  --   --  1.9  ? ? ?GFR: ?Estimated Creatinine Clearance: 47.4 mL/min (by C-G formula based on SCr of 0.94 mg/dL). ? ?Liver Function Tests: ?Recent Labs  ?Lab 12/05/21 ?0211  ?AST 20  ?ALT 14  ?ALKPHOS 45  ?BILITOT 1.1  ?PROT 6.2*  ?ALBUMIN 3.5  ? ? ? ?HbA1C: ?Recent Labs  ?  12/05/21 ?0250  ?HGBA1C 5.9*  ? ? ?CBG: ?Recent Labs  ?Lab 12/05/21 ?0755 12/05/21 ?1210 12/05/21 ?1800  ?GLUCAP 93 106* 89  ? ? ?Lipid Profile: ?Recent Labs  ?  12/05/21 ?0211  ?CHOL 164  ?HDL 60  ?Post 98  ?TRIG 31  ?CHOLHDL 2.7  ? ? ? ?Recent Results (from the past 240 hour(s))  ?Resp Panel by RT-PCR (Flu A&B, Covid) Nasopharyngeal Swab     Status: None  ? Collection Time: 12/04/21  3:16 PM  ? Specimen: Nasopharyngeal Swab; Nasopharyngeal(NP) swabs in vial transport medium  ?Result Value Ref Range Status  ? SARS Coronavirus 2 by RT PCR NEGATIVE NEGATIVE Final  ?  Comment: (NOTE) ?SARS-CoV-2 target nucleic acids are NOT DETECTED. ? ?The SARS-CoV-2 RNA is generally detectable in upper respiratory ?specimens during the acute phase of infection. The lowest ?concentration of SARS-CoV-2 viral copies this assay can detect is ?138 copies/mL.  A negative result does not preclude SARS-Cov-2 ?infection and should not be used as the sole basis for treatment or ?other patient management decisions. A negative result may occur with  ?improper specimen collection/handling, submission of specimen other ?than nasopharyngeal swab, presence of viral mutation(s) within the ?areas targeted by this assay, and inadequate number of viral ?copies(<138 copies/mL). A negative result must be combined with ?clinical observations, patient history, and epidemiological ?information. The expected result is Negative. ? ?Fact Sheet for Patients:  ?EntrepreneurPulse.com.au ? ?Fact Sheet for Healthcare Providers:  ?IncredibleEmployment.be ? ?This test is no t yet approved or cleared by the [place] FDA and  ?has been authorized for detection and/or diagnosis of SARS-CoV-2 by ?FDA under an Emergency Use Authorization (EUA). This EUA will remain  ?in effect (meaning this test can be used) for the duration of the ?COVID-19  declaration under Section 564(b)(1) of the Act, 21 ?U.S.C.section 360bbb-3(b)(1), unless the authorization is terminated  ?or revoked sooner.  ? ? ?  ? Influenza A by PCR NEGATIVE NEGATIVE Final  ? Influenza B by PCR NEGATIVE NEGATIVE Final  ?  Comment: (NOTE) ?The Xpert Xpress SARS-CoV-2/FLU/RSV plus assay is intended as an aid ?in the diagnosis of influenza from Nasopharyngeal swab specimens and ?should not be used as a sole basis for treatment. Nasal washings and ?aspirates are unacceptable for Xpert Xpress SARS-CoV-2/FLU/RSV ?testing. ? ?Fact Sheet for Patients: ?EntrepreneurPulse.com.au ? ?Fact Sheet for Healthcare Providers: ?IncredibleEmployment.be ? ?This test is not yet approved or cleared by the [place] FDA and ?has been authorized for detection and/or diagnosis of SARS-CoV-2 by ?FDA under an Emergency Use Authorization (EUA). This EUA will remain ?in effect (meaning this test  can be used) for the duration of the ?COVID-19 declaration under Section 564(b)(1) of the Act, 21 U.S.C. ?section 360bbb-3(b)(1), unless the authorization is terminated or ?revoked. ? ?Performed at [facility]

## 2021-12-06 NOTE — Assessment & Plan Note (Signed)
Patient mentions that this was diagnosed about 3 years ago by an endocrinologist.  This is the reason patient is on spironolactone. ?

## 2021-12-06 NOTE — Progress Notes (Signed)
Physical Therapy Treatment ?Patient Details ?Name: Anne [name] ?MRN: [id number] ?DOB: August 09, 1946 ?Today's Date: 12/06/2021 ? ? ?History of Present Illness 76 year old Female admitted 3/28  presented to [facility] emergency department at the direction of her primary care provider due to frequent bouts of lightheadedness/spinning. PMH:  coronary artery disease (S/P NSTEMI 03/2020 with DES to LAD), diastolic congestive heart failure (Echo 03/2020 EF 55-60% with G1DD), primary hyperaldosteronism, hypertension, hyperlipidemia ? ?  ?PT Comments  ? ? Pt demonstrated the exercises that she had been doing; exercises were modified to increase repetitions of exercises. Pt reported onset of dizziness with exercises and resolution with rest. Pt ambulated using technique of focusing her eyes on a single point during ambulation and was able to compensate for vestibular system. During treatment session patient showed deficits in endurance, activity tolerance, and balance. Recommending therapy services at home health physical therapy to address the previously stated deficits. Will continue to follow acutely to maximize functional mobility, independence and safety. ?   ?Recommendations for follow up therapy are one component of a multi-disciplinary discharge planning process, led by the attending physician.  Recommendations may be updated based on patient status, additional functional criteria and insurance authorization. ? ?Follow Up Recommendations ? Home health PT ?  ?  ?Assistance Recommended at Discharge Frequent or constant Supervision/Assistance  ?Patient can return home with the following A little help with bathing/dressing/bathroom;Assistance with cooking/housework;Help with stairs or ramp for entrance;A lot of help with walking and/or transfers ?  ?Equipment Recommendations ? Wheelchair (measurements PT);Wheelchair cushion (measurements PT)  ?  ?Recommendations for Other Services   ? ? ?  ?Precautions /  Restrictions Precautions ?Precautions: Fall ?Restrictions ?Weight Bearing Restrictions: No  ?  ? ?Mobility ? Bed Mobility ?  ?  ?  ?  ?  ?  ?  ?General bed mobility comments: Pt was up in the chair upon start of the session ?  ? ?Transfers ?Overall transfer level: Needs assistance ?Equipment used: Rolling walker (2 wheels) ?Transfers: Sit to/from Stand ?Sit to Stand: Min guard ?  ?  ?  ?  ?  ?General transfer comment: Pt was min guard for sit to stand transfer due to dizziness symptoms but did not need physical assistance to strand. ?  ? ?Ambulation/Gait ?Ambulation/Gait assistance: +2 physical assistance, +2 safety/equipment, Mod assist ?Gait Distance (Feet): 80 Feet ?Assistive device: 2 person hand held assist ?Gait Pattern/deviations: Step-through pattern, Decreased stride length ?Gait velocity: decrease ?  ?  ?General Gait Details: Pt was nervous about walking. she was able demonstrate improved stability by focusing on a stationary point in the direction that she was walking towards. Pt reported fatigue with walking. Technique to help with balance while walking needs reinforcement. ? ? ?Stairs ?  ?  ?  ?  ?  ? ? ?Wheelchair Mobility ?  ? ?Modified Rankin (Stroke Patients Only) ?  ? ? ?  ?Balance Overall balance assessment: Needs assistance ?Sitting-balance support: No upper extremity supported, Feet supported ?Sitting balance-Leahy Scale: Fair ?  ?  ?Standing balance support: Bilateral upper extremity supported ?Standing balance-Leahy Scale: Poor ?Standing balance comment: Pt required hand held assist on either side of her upon standing up from the chair. Mod assist +2 was required during ambulation. ?  ?  ?  ?  ?  ?  ?  ?  ?  ?  ?  ?  ? ?  ?Cognition Arousal/Alertness: Awake/alert ?Behavior During Therapy: [facility] for tasks assessed/performed ?Overall Cognitive Status: Within Functional Limits  for tasks assessed ?  ?  ?  ?  ?  ?  ?  ?  ?  ?  ?  ?  ?  ?  ?  ?  ?  ?  ?  ? ?  ?Exercises Other Exercises ?Other  Exercises: Gaze stability exercises, up/down and left/right, 20x each, 2 sets of each, 5 x day ? ?  ?General Comments General comments (skin integrity, edema, etc.): Daughter was present during the session ?  ?  ? ?Pertinent Vitals/Pain Pain Assessment ?Pain Assessment: No/denies pain  ? ? ?Home Living   ?  ?  ?  ?  ?  ?  ?  ?  ?  ?   ?  ?Prior Function    ?  ?  ?   ? ?PT Goals (current goals can now be found in the care plan section)   ? ?  ?Frequency ? ? ? Min 4X/week ? ? ? ?  ?PT Plan Current plan remains appropriate  ? ? ?Co-evaluation   ?  ?  ?  ?  ? ?  ?AM-PAC PT "6 Clicks" Mobility   ?Outcome Measure ? Help needed turning from your back to your side while in a flat bed without using bedrails?: None ?Help needed moving from lying on your back to sitting on the side of a flat bed without using bedrails?: A Little ?Help needed moving to and from a bed to a chair (including a wheelchair)?: A Little ?Help needed standing up from a chair using your arms (e.g., wheelchair or bedside chair)?: A Little ?Help needed to walk in hospital room?: A Lot ?Help needed climbing 3-5 steps with a railing? : A Lot ?6 Click Score: 17 ? ?  ?End of Session Equipment Utilized During Treatment: Gait belt ?Activity Tolerance: Patient limited by fatigue ?Patient left: with call bell/phone within reach;in bed ?Nurse Communication: Mobility status ?PT Visit Diagnosis: Unsteadiness on feet (R26.81);Dizziness and giddiness (R42) ?  ? ? ?Time: 0630-1601 ?PT Time Calculation (min) (ACUTE ONLY): 27 min ? ?Charges:  $Gait Training: 8-22 mins ?$Neuromuscular Re-education: 8-22 mins          ?          ? ?Quenton Fetter, SPT ? ? ? ?Quenton Fetter ?12/06/2021, 3:44 PM ? ?

## 2021-12-06 NOTE — TOC Initial Note (Signed)
Transition of Care (TOC) - Initial/Assessment Note  ? ? ?Patient Details  ?Name: Anne [name] ?MRN: [id number] ?Date of Birth: Mar 24, 1946 ? ?Transition of Care (TOC) CM/SW Contact:    ?Zenon Mayo, RN ?Phone Number: ?12/06/2021, 4:27 PM ? ?Clinical Narrative:                 ?NCM spoke with patient at bedside, offered choice for [facility] services, she states she really do not like people coming in her home , she would rather like to do outpatient physical therapy instead, she is ok with the [facility] facility. Also patient wants to know what her copay will be for the wheelchair if she gets one and the shower seat, she states she and husband does not want to incur any extra bills they will have to pay.  NCM spoke with Maudie Mercury with [employer] and she states she will have a rep to call patient back on her cell at [phone] to go over cost for DME.  ? ?Expected Discharge Plan: [place] ?Barriers to Discharge: Continued Medical Work up ? ? ?Patient Goals and CMS Choice ?Patient states their goals for this hospitalization and ongoing recovery are:: return home ?CMS Medicare.gov Compare Post Acute Care list provided to:: Patient ?Choice offered to / list presented to : Patient ? ?Expected Discharge Plan and Services ?Expected Discharge Plan: [place] ?  ?Discharge Planning Services: CM Consult ?Post Acute Care Choice: Home Health ?Living arrangements for the past 2 months: [place] ?                ?  ?DME Agency: NA ?  ?  ?  ?HH Arranged: PT, OT ?  ?  ?  ?  ? ?Prior Living Arrangements/Services ?Living arrangements for the past 2 months: [place] ?Lives with:: Spouse ?Patient language and need for interpreter reviewed:: Yes ?Do you feel safe going back to the place where you live?: Yes      ?Need for Family Participation in Patient Care: Yes (Comment) ?Care giver support system in place?: Yes (comment) ?Current home services: DME (walker) ?Criminal Activity/Legal  Involvement Pertinent to Current Situation/Hospitalization: No - Comment as needed ? ?Activities of Daily Living ?  ?  ? ?Permission Sought/Granted ?  ?  ?   ?   ?   ?   ? ?Emotional Assessment ?Appearance:: Appears stated age ?Attitude/Demeanor/Rapport: Engaged ?Affect (typically observed): Appropriate ?Orientation: : Oriented to Self, Oriented to Place, Oriented to  Time, Oriented to Situation ?Alcohol / Substance Use: Not Applicable ?Psych Involvement: No (comment) ? ?Admission diagnosis:  Dizziness [R42] ?Vertigo [R42] ?Near syncope [R55] ?Intractable headache, unspecified chronicity pattern, unspecified headache type [R51.9] ?Patient Active Problem List  ? Diagnosis Date Noted  ? Hyperaldosteronism ([place]) 12/06/2021  ? Chronic kidney disease, stage 3a ([place]) 12/05/2021  ? Type 2 diabetes mellitus with stage 3a chronic kidney disease, without long-term current use of insulin ([place]) 12/05/2021  ? Chronic diastolic CHF (congestive heart failure) ([place]) 12/05/2021  ? Coronary artery disease involving native coronary artery of native heart 03/28/2021  ? History of non-ST elevation myocardial infarction (NSTEMI) 03/28/2021  ? Hyperlipidemia LDL goal <70 03/28/2021  ? NSTEMI (non-ST elevated myocardial infarction) ([place]) 03/15/2020  ? ACS (acute coronary syndrome) ([place])   ? Near syncope 12/21/2019  ? Weight loss, non-intentional 12/21/2019  ? Hypertensive urgency 12/21/2019  ? Renal insufficiency 12/21/2019  ? Pancreatic cyst 12/21/2019  ? History of  diabetes mellitus 12/21/2019  ? Small bowel obstruction ([place]) 07/07/2015  ? SBO (small bowel obstruction) ([place]) 07/07/2015  ? 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 Back pain 10/21/2014  ? Routine general medical examination at a health care facility 07/12/2014  ? Hearing loss 09/27/2012  ? Other screening mammogram 09/27/2012  ? Pure hypercholesterolemia 09/25/2012  ? Hypokalemia 08/31/2012  ? HTN (hypertension) 10/01/2011  ? ?PCP:  Johna Roles, PA ?Pharmacy:    ?[id number] [place], [place] - [address] [id number]?Phone: [phone]: [phone]? ? ? ? ?Social Determinants of Health (SDOH) Interventions ?  ? ?Readmission Risk Interventions ?   ? View : No data to display.  ?  ?  ?  ? ? ? ?

## 2021-12-07 DIAGNOSIS — E2740 Unspecified adrenocortical insufficiency: Secondary | ICD-10-CM | POA: Diagnosis present

## 2021-12-07 LAB — ACTH STIMULATION, 3 TIME POINTS
Cortisol, 30 Min: 12.1 ug/dL
Cortisol, 60 Min: 15.1 ug/dL
Cortisol, Base: 5.2 ug/dL

## 2021-12-07 LAB — CBC
HCT: 32.6 % — ABNORMAL LOW (ref 36.0–46.0)
Hemoglobin: 11.5 g/dL — ABNORMAL LOW (ref 12.0–15.0)
MCH: 29.6 pg (ref 26.0–34.0)
MCHC: 35.3 g/dL (ref 30.0–36.0)
MCV: 83.8 fL (ref 80.0–100.0)
Platelets: 183 10*3/uL (ref 150–400)
RBC: 3.89 MIL/uL (ref 3.87–5.11)
RDW: 13.2 % (ref 11.5–15.5)
WBC: 3 10*3/uL — ABNORMAL LOW (ref 4.0–10.5)
nRBC: 0 % (ref 0.0–0.2)

## 2021-12-07 LAB — COMPREHENSIVE METABOLIC PANEL
ALT: 12 U/L (ref 0–44)
AST: 19 U/L (ref 15–41)
Albumin: 3.2 g/dL — ABNORMAL LOW (ref 3.5–5.0)
Alkaline Phosphatase: 46 U/L (ref 38–126)
Anion gap: 5 (ref 5–15)
BUN: 11 mg/dL (ref 8–23)
CO2: 27 mmol/L (ref 22–32)
Calcium: 8.9 mg/dL (ref 8.9–10.3)
Chloride: 105 mmol/L (ref 98–111)
Creatinine, Ser: 1.07 mg/dL — ABNORMAL HIGH (ref 0.44–1.00)
GFR, Estimated: 55 mL/min — ABNORMAL LOW (ref >60)
Glucose, Bld: 85 mg/dL (ref 70–99)
Potassium: 3.7 mmol/L (ref 3.5–5.1)
Sodium: 137 mmol/L (ref 135–145)
Total Bilirubin: 1.1 mg/dL (ref 0.3–1.2)
Total Protein: 6.1 g/dL — ABNORMAL LOW (ref 6.5–8.1)

## 2021-12-07 LAB — MAGNESIUM: Magnesium: 1.8 mg/dL (ref 1.7–2.4)

## 2021-12-07 MED ORDER — MECLIZINE HCL 25 MG PO TABS: ORAL_TABLET | ORAL | 0 refills | Status: DC

## 2021-12-07 MED ORDER — HYDROCORTISONE 5 MG PO TABS: ORAL_TABLET | ORAL | 1 refills | Status: DC

## 2021-12-07 MED ORDER — HYDROCORTISONE 5 MG PO TABS
5.0000 mg | ORAL_TABLET | ORAL | Status: DC
  Filled 2021-12-07: qty 1

## 2021-12-07 MED ORDER — MAGNESIUM 500 MG PO TABS: 500.0000 mg | ORAL_TABLET | Freq: Every day | ORAL | Status: DC

## 2021-12-07 MED ORDER — HYDROCORTISONE 10 MG PO TABS
10.0000 mg | ORAL_TABLET | Freq: Every day | ORAL | Status: DC
  Administered 2021-12-07: 10 mg via ORAL
  Filled 2021-12-07: qty 1

## 2021-12-07 MED ORDER — DIAZEPAM 2 MG PO TABS: ORAL_TABLET | ORAL | 0 refills | Status: DC

## 2021-12-07 NOTE — Progress Notes (Signed)
Physical Therapy Treatment ?Patient Details ?Name: Anne [name] ?MRN: [id number] ?DOB: 1945-10-10 ?Today's Date: 12/07/2021 ? ? ?History of Present Illness 76 year old Female admitted 3/28  presented to [facility] emergency department at the direction of her primary care provider due to frequent bouts of lightheadedness/spinning. PMH:  coronary artery disease (S/P NSTEMI 03/2020 with DES to LAD), diastolic congestive heart failure (Echo 03/2020 EF 55-60% with G1DD), primary hyperaldosteronism, hypertension, hyperlipidemia ? ?  ?PT Comments  ? ? The pt was agreeable to session, continues to verbalize concerns regarding mobility at home as she has fallen when using RW in the past and states her husband has limited ability to assist due to back surgery in 10/22. The pt required up to modA to power up and steady, and is heavily dependent on UE as she was unable to stand without assist or use of UE at any time today. The pt did complete short bout of ambulation in the room with single UE support or BUE support on RW, but demos significant lateral sway and impulsive movements regardless of UE support provided. She reports constant dizziness but was inconsistent with presentation of dizziness and LOB through session, remains at high risk of falls and therefore we discussed alternate methods of acquiring WC since insurance will not provide. Continue to recommend max HHPT to improve pt strength and independence.  ?  ?Recommendations for follow up therapy are one component of a multi-disciplinary discharge planning process, led by the attending physician.  Recommendations may be updated based on patient status, additional functional criteria and insurance authorization. ? ?Follow Up Recommendations ? Home health PT ?  ?  ?Assistance Recommended at Discharge Frequent or constant Supervision/Assistance  ?Patient can return home with the following A little help with bathing/dressing/bathroom;Assistance with  cooking/housework;Help with stairs or ramp for entrance;A lot of help with walking and/or transfers ?  ?Equipment Recommendations ? Wheelchair (measurements PT);Wheelchair cushion (measurements PT)  ?  ?Recommendations for Other Services   ? ? ?  ?Precautions / Restrictions Precautions ?Precautions: Fall ?Restrictions ?Weight Bearing Restrictions: No  ?  ? ?Mobility ? Bed Mobility ?Overal bed mobility: Needs Assistance ?  ?  ?  ?  ?  ?  ?General bed mobility comments: pt sitting EOB upon arrival ?  ? ?Transfers ?Overall transfer level: Needs assistance ?Equipment used: Rolling walker (2 wheels) ?Transfers: Sit to/from Stand ?Sit to Stand: Min assist ?  ?  ?  ?  ?  ?General transfer comment: pt with heavy dependence on BUE, unable to stand without use of UE and minA to steady. no anterior wt shift or forward movement of trunk. pt with sporatic movement of feet while trying to stand and reports this is due to her dizziness ?  ? ?Ambulation/Gait ?Ambulation/Gait assistance: Mod assist, Min assist, +2 physical assistance ?Gait Distance (Feet): 25 Feet ?Assistive device: 1 person hand held assist, 2 person hand held assist ?Gait Pattern/deviations: Step-to pattern, Decreased stride length, Decreased weight shift to right, Decreased weight shift to left, Shuffle, Trunk flexed, Narrow base of support ?Gait velocity: decreased ?Gait velocity interpretation: <1.31 ft/sec, indicative of household ambulator ?  ?General Gait Details: pt reports she is unable to use a RW as they make her fall and that her spouse is unable to assist her due to having recent back sugery. modA with HHA to steady with pt taking small hesitant steps and frequently having short bouts of increased sway. increased need for assist with use of RW as the pt impulsively  pushed RW in front of her when steppin despite cues for technique and position in RW. ? ? ? ?  ? ? ?  ?Balance Overall balance assessment: Needs assistance ?Sitting-balance support: No upper  extremity supported, Feet supported ?Sitting balance-Leahy Scale: Fair ?  ?  ?Standing balance support: Bilateral upper extremity supported ?Standing balance-Leahy Scale: Poor ?Standing balance comment: Pt required hand held assist and modA or BUE support on RW with modA of 2 ?  ?  ?  ?  ?  ?  ?  ?  ?  ?  ?  ?  ? ?  ?Cognition Arousal/Alertness: Awake/alert ?Behavior During Therapy: [facility] for tasks assessed/performed ?Overall Cognitive Status: Impaired/Different from baseline ?Area of Impairment: Safety/judgement, Awareness, Problem solving ?  ?  ?  ?  ?  ?  ?  ?  ?  ?  ?  ?  ?Safety/Judgement: Decreased awareness of safety, Decreased awareness of deficits ?Awareness: Emergent ?Problem Solving: Slow processing, Requires verbal cues ?General Comments: pt tangential, does not respond well to reflective listening or education. reports significant anxiety about both use of RW and walking. states her husband is unable to assist after recent back surgery and fear of RW. pt with inconsistent presentation of dizziness through session ?  ?  ? ?  ?Exercises   ? ?  ?General Comments General comments (skin integrity, edema, etc.): VSS on RA ?  ?  ? ?Pertinent Vitals/Pain Pain Assessment ?Pain Assessment: No/denies pain  ? ? ? ?PT Goals (current goals can now be found in the care plan section) Acute Rehab PT Goals ?Patient Stated Goal: to go home ?PT Goal Formulation: With patient ?Time For Goal Achievement: 12/19/21 ?Potential to Achieve Goals: Good ?Progress towards PT goals: Progressing toward goals ? ?  ?Frequency ? ? ? Min 4X/week ? ? ? ?  ?PT Plan Current plan remains appropriate  ? ? ?   ?AM-PAC PT "6 Clicks" Mobility   ?Outcome Measure ? Help needed turning from your back to your side while in a flat bed without using bedrails?: None ?Help needed moving from lying on your back to sitting on the side of a flat bed without using bedrails?: A Little ?Help needed moving to and from a bed to a chair (including a wheelchair)?: A  Little ?Help needed standing up from a chair using your arms (e.g., wheelchair or bedside chair)?: A Little ?Help needed to walk in hospital room?: A Lot ?Help needed climbing 3-5 steps with a railing? : A Lot ?6 Click Score: 17 ? ?  ?End of Session Equipment Utilized During Treatment: Gait belt ?Activity Tolerance: Patient limited by fatigue ?Patient left: with call bell/phone within reach;in chair;with chair alarm set ?Nurse Communication: Mobility status ?PT Visit Diagnosis: Unsteadiness on feet (R26.81);Dizziness and giddiness (R42) ?  ? ? ?Time: 2924-4628 ?PT Time Calculation (min) (ACUTE ONLY): 33 min ? ?Charges:  $Therapeutic Activity: 8-22 mins ?$Self Care/Home Management: 8-22          ?          ? ?West Carbo, PT, DPT  ? ?Acute Rehabilitation Department ?Pager #: [phone] - [phone] ? ? ?Sandra Cockayne ?12/07/2021, 12:47 PM ? ?

## 2021-12-07 NOTE — TOC Transition Note (Signed)
Transition of Care (TOC) - CM/SW Discharge Note ? ? ?Patient Details  ?Name: Anne [name] ?MRN: [id number] ?Date of Birth: [birth year] ? ?Transition of Care (TOC) CM/SW Contact:  ?Zenon Mayo, RN ?Phone Number: ?12/07/2021, 11:19 AM ? ? ?Clinical Narrative:    ?Patient is for dc today, her  spouse will transport her home.  She states [facility] is her first choice from the Medicare.gov list.  Also she is ok with [employer] supplying the shower chair for her, she will pay the 35.00 for the shower chair w/out a back and it will be delivered to her home.  She states to have [employer] to call her first and do not call her spouse because she will have to explain to her husband first.  [facility] for HHPT and HHOT will be on Monday is when patient requested this to be started.  NCM informed Delsa Sale of this information.  ? ? ?Final next level of care: [place] ?Barriers to Discharge: No Barriers Identified ? ? ?Patient Goals and CMS Choice ?Patient states their goals for this hospitalization and ongoing recovery are:: return home ?CMS Medicare.gov Compare Post Acute Care list provided to:: Patient ?Choice offered to / list presented to : Patient ? ?Discharge Placement ?  ?           ?  ?  ?  ?  ? ?Discharge Plan and Services ?  ?Discharge Planning Services: CM Consult ?Post Acute Care Choice: Home Health          ?DME Arranged: Shower stool ?DME Agency: [employer] ?Date DME Agency Contacted: 12/07/21 ?Time DME Agency Contacted: [phone] ?Representative spoke with at DME Agency: Adela Lank ?HH Arranged: PT, OT ?[place] Agency: [facility] ?Date HH Agency Contacted: 12/07/21 ?Time [place]: [phone] ?Representative spoke with at [place]: Bradd Canary ? ?Social Determinants of Health (SDOH) Interventions ?  ? ? ?Readmission Risk Interventions ?   ? View : No data to display.  ?  ?  ?  ? ? ? ? ? ?

## 2021-12-07 NOTE — Discharge Summary (Signed)
?[facility] ? ?Physician Discharge Summary  ? ?Patient ID: ?Anne [name] ?MRN: [id number] ?DOB/AGE: Mar 29, 1946 76 y.o. ? ?Admit date: 12/04/2021 ?Discharge date: 12/07/2021   ? ?PCP: Johna Roles, PA ? ?DISCHARGE DIAGNOSES:  ?Principal Problem: ?  Near syncope ?Active Problems: ?  Coronary artery disease involving native coronary artery of native heart ?  Chronic kidney disease, stage 3a ([place]) ?  Chronic diastolic CHF (congestive heart failure) ([place]) ?  Hyperaldosteronism ([place]) ?  Adrenal insufficiency ([place]) ? ? ?RECOMMENDATIONS FOR OUTPATIENT FOLLOW UP: ?Patient to follow-up with Dr. Buddy Duty with endocrinology ?Consider referral to ENT if symptoms do not improve in the next 1 to 2 weeks ? ? ? ?Home Health: PT ?Equipment/Devices: None ? ?CODE STATUS: Full code ? ?DISCHARGE CONDITION: fair ? ?Diet recommendation: As before ? ?INITIAL HISTORY: ?76 year old Female with past medical history of coronary artery disease (S/P NSTEMI 03/2020 with DES to LAD), diastolic congestive heart failure (Echo 03/2020 EF 55-60% with G1DD), primary hyperaldosteronism, hypertension, hyperlipidemia who presented to [facility] emergency department at the direction of her primary care provider due to frequent bouts of lightheadedness.  Associated with palpitations.  Hospitalized for further management. ? ?Consultations: ?None ? ?Procedures: ?Transthoracic echocardiogram ? ? ? ?HOSPITAL COURSE:  ? ? ?* Near syncope ?Symptoms thought to be secondary to vertigo though they are not classically suggestive of the same. ?She does have left-sided weakness which is chronic per patient. ?MRI brain did not show any acute infarct.  MRA head and neck does not show any stenosis especially in the posterior circulation.  ?Orthostatic vital signs were unremarkable. Troponins are normal. ?Echocardiogram shows normal systolic function with EF of 55 to 60%.  No regional wall motion abnormality noted.  No significant valvular disease  noted. ?Patient was started on meclizine.  Low-dose Valium was added as well.  Seen by PT OT.  Improved.  Home health has been ordered at discharge.  ? ?Coronary artery disease involving native coronary artery of native heart ?Symptoms unlikely to be cardiac in etiology.  Cardiac status is stable.  Echocardiogram reassuring. ? ?Chronic kidney disease, stage 3a ([place]) ?Renal function is stable.   ? ?Chronic diastolic CHF (congestive heart failure) ([place]) ?No evidence for volume overload. ? ? ?Adrenal insufficiency ([place]) ?Patient noted to have low cortisol level.  ACTH stimulation test was performed which shows a suboptimal response.  30-minute level was 12.1 and 16 minutes level was 15.1.  Quite possible patient has adrenal insufficiency.  We will place her on hydrocortisone.  She is already followed by endocrinology, Dr. Buddy Duty.  We will recommend that she follows up with him in the next few weeks. ? ?Hyperaldosteronism ([place]) ?Patient mentions that this was diagnosed about 3 years ago by an endocrinologist.  This is the reason patient is on spironolactone. ? ? ?Patient is stable.  Symptoms have improved though she is not fully back to baseline.  She was reassured.  Asked to follow-up with her PCP and endocrinologist.  Home health has been ordered.  Okay for discharge today. ? ? ?PERTINENT LABS: ? ?The results of significant diagnostics from this hospitalization (including imaging, microbiology, ancillary and laboratory) are listed below for reference.   ? ?Microbiology: ?Recent Results (from the past 240 hour(s))  ?Resp Panel by RT-PCR (Flu A&B, Covid) Nasopharyngeal Swab     Status: None  ? Collection Time: 12/04/21  3:16 PM  ? Specimen: Nasopharyngeal Swab; Nasopharyngeal(NP) swabs in vial transport medium  ?Result Value Ref Range Status  ? SARS  Coronavirus 2 by RT PCR NEGATIVE NEGATIVE Final  ?  Comment: (NOTE) ?SARS-CoV-2 target nucleic acids are NOT DETECTED. ? ?The SARS-CoV-2 RNA is generally detectable in upper  respiratory ?specimens during the acute phase of infection. The lowest ?concentration of SARS-CoV-2 viral copies this assay can detect is ?138 copies/mL. A negative result does not preclude SARS-Cov-2 ?infection and should not be used as the sole basis for treatment or ?other patient management decisions. A negative result may occur with  ?improper specimen collection/handling, submission of specimen other ?than nasopharyngeal swab, presence of viral mutation(s) within the ?areas targeted by this assay, and inadequate number of viral ?copies(<138 copies/mL). A negative result must be combined with ?clinical observations, patient history, and epidemiological ?information. The expected result is Negative. ? ?Fact Sheet for Patients:  ?EntrepreneurPulse.com.au ? ?Fact Sheet for Healthcare Providers:  ?IncredibleEmployment.be ? ?This test is no t yet approved or cleared by the [place] FDA and  ?has been authorized for detection and/or diagnosis of SARS-CoV-2 by ?FDA under an Emergency Use Authorization (EUA). This EUA will remain  ?in effect (meaning this test can be used) for the duration of the ?COVID-19 declaration under Section 564(b)(1) of the Act, 21 ?U.S.C.section 360bbb-3(b)(1), unless the authorization is terminated  ?or revoked sooner.  ? ? ?  ? Influenza A by PCR NEGATIVE NEGATIVE Final  ? Influenza B by PCR NEGATIVE NEGATIVE Final  ?  Comment: (NOTE) ?The Xpert Xpress SARS-CoV-2/FLU/RSV plus assay is intended as an aid ?in the diagnosis of influenza from Nasopharyngeal swab specimens and ?should not be used as a sole basis for treatment. Nasal washings and ?aspirates are unacceptable for Xpert Xpress SARS-CoV-2/FLU/RSV ?testing. ? ?Fact Sheet for Patients: ?EntrepreneurPulse.com.au ? ?Fact Sheet for Healthcare Providers: ?IncredibleEmployment.be ? ?This test is not yet approved or cleared by the [place] FDA and ?has been  authorized for detection and/or diagnosis of SARS-CoV-2 by ?FDA under an Emergency Use Authorization (EUA). This EUA will remain ?in effect (meaning this test can be used) for the duration of the ?COVID-19 declaration under Section 564(b)(1) of the Act, 21 U.S.C. ?section 360bbb-3(b)(1), unless the authorization is terminated or ?revoked. ? ?Performed at [facility], [place] [address], [place], [place] ?[address] ?  ?  ? ?Labs: ? ?COVID-19 Labs ? ? ? ?Lab Results  ?Component Value Date  ? [place] NEGATIVE 12/04/2021  ? [place] NEGATIVE 08/11/2021  ? [place] NEGATIVE 03/15/2020  ? [place] NEGATIVE 12/21/2019  ? ? ? ? ?Basic Metabolic Panel: ?Recent Labs  ?Lab 12/03/21 ?1719 12/04/21 ?1515 12/05/21 ?0211 12/07/21 ?6384  ?NA 139 139 138 137  ?K 4.3 4.0 3.6 3.7  ?CL 103 104 107 105  ?CO2 '29 29 22 27  '$ ?GLUCOSE 88 139* 95 85  ?BUN '15 11 13 11  '$ ?CREATININE 1.02* 1.05* 0.94 1.07*  ?CALCIUM 9.9 9.6 9.0 8.9  ?MG  --   --  1.9 1.8  ? ?Liver Function Tests: ?Recent Labs  ?Lab 12/05/21 ?0211 12/07/21 ?6659  ?AST 20 19  ?ALT 14 12  ?ALKPHOS 45 46  ?BILITOT 1.1 1.1  ?PROT 6.2* 6.1*  ?ALBUMIN 3.5 3.2*  ? ?CBC: ?Recent Labs  ?Lab 12/03/21 ?1719 12/04/21 ?1515 12/05/21 ?0250 12/07/21 ?9357  ?WBC 4.6 4.0 3.4* 3.0*  ?NEUTROABS  --   --  1.7  --   ?HGB 12.3 13.0 11.2* 11.5*  ?HCT 36.8 37.9 32.0* 32.6*  ?MCV 84.4 85.4 84.4 83.8  ?PLT 202 225 185 183  ? ? ?CBG: ?Recent Labs  ?Lab  12/05/21 ?0755 12/05/21 ?1210 12/05/21 ?1800  ?GLUCAP 93 106* 89  ? ? ? ?IMAGING STUDIES ?CT ANGIO HEAD NECK W WO CM ? ?Result Date: 12/04/2021 ?CLINICAL DATA:  Initial evaluation for neuro deficit, stroke suspected. EXAM: CT ANGIOGRAPHY HEAD AND NECK TECHNIQUE: Multidetector CT imaging of the head and neck was performed using the standard protocol during bolus administration of intravenous contrast. Multiplanar CT image reconstructions and MIPs were obtained to evaluate the vascular anatomy. Carotid stenosis measurements (when applicable) are  obtained utilizing NASCET criteria, using the distal internal carotid diameter as the denominator. RADIATION DOSE REDUCTION: This exam was performed according to the departmental dose-optimization program which incl

## 2021-12-07 NOTE — Assessment & Plan Note (Signed)
Patient noted to have low cortisol level.  ACTH stimulation test was performed which shows a suboptimal response.  30-minute level was 12.1 and 16 minutes level was 15.1.  Quite possible patient has adrenal insufficiency.  We will place her on hydrocortisone.  She is already followed by endocrinology, Dr. Buddy Duty.  We will recommend that she follows up with him in the next few weeks. ?

## 2021-12-10 NOTE — Progress Notes (Signed)
Occupational Therapy Treatment ?Patient Details ?Name: Anne [name] ?MRN: [id number] ?DOB: 1946/02/09 ?Today's Date: 12/10/2021 ? ? ?History of present illness 76 year old Female admitted 3/28  presented to [facility] emergency department at the direction of her primary care provider due to frequent bouts of lightheadedness/spinning. PMH:  coronary artery disease (S/P NSTEMI 03/2020 with DES to LAD), diastolic congestive heart failure (Echo 03/2020 EF 55-60% with G1DD), primary hyperaldosteronism, hypertension, hyperlipidemia ?  ?OT comments ? Patient received up with PT attempting to ambulate to recliner. Patient with need for significant assist with RW, citing dizziness and difficulty managing RW despite patient at min guard level in previous sessions. Patient requiring +2 min A (HHA) in order to get to recliner safely. Patient minimally receptive to alternative strategies for finding a wheelchair (as she doesn't want to pay for one and insurance wont currently provide DME), stating, "well I can afford a wheelchair". Patient refusing further rehab despite need for increased assistance stating her family will support what she needs. Discharge remains appropriate, OT will continue to follow.   ? ?Recommendations for follow up therapy are one component of a multi-disciplinary discharge planning process, led by the attending physician.  Recommendations may be updated based on patient status, additional functional criteria and insurance authorization. ?   ?Follow Up Recommendations ? Home health OT  ?  ?Assistance Recommended at Discharge Intermittent Supervision/Assistance  ?Patient can return home with the following ? Help with stairs or ramp for entrance;Assistance with cooking/housework;A little help with bathing/dressing/bathroom;A little help with walking and/or transfers ?  ?Equipment Recommendations ? Wheelchair (measurements OT);Wheelchair cushion (measurements OT);Tub/shower seat  ?  ?Recommendations  for Other Services   ? ?  ?Precautions / Restrictions Precautions ?Precautions: Fall ?Restrictions ?Weight Bearing Restrictions: No  ? ? ?  ? ?Mobility Bed Mobility ?  ?  ?  ?  ?  ?  ?  ?General bed mobility comments: patient up with PT upon arrival ?  ? ?Transfers ?Overall transfer level: Needs assistance ?Equipment used: Rolling walker (2 wheels) ?Transfers: Sit to/from Stand ?Sit to Stand: Min assist ?  ?  ?  ?  ?  ?General transfer comment: pt with heavy dependence on BUE, unable to stand without use of UE and minA to steady. no anterior wt shift or forward movement of trunk. pt with sporatic movement of feet while trying to stand and reports this is due to her dizziness ?  ?  ?Balance Overall balance assessment: Needs assistance ?Sitting-balance support: No upper extremity supported, Feet supported ?Sitting balance-Leahy Scale: Fair ?  ?  ?Standing balance support: Bilateral upper extremity supported ?Standing balance-Leahy Scale: Poor ?Standing balance comment: Pt required hand held assist and modA or BUE support on RW with modA of 2 ?  ?  ?  ?  ?  ?  ?  ?  ?  ?  ?  ?   ? ?ADL either performed or assessed with clinical judgement  ? ?ADL Overall ADL's : Needs assistance/impaired ?  ?  ?  ?  ?  ?  ?  ?  ?  ?  ?  ?  ?Toilet Transfer: +2 for physical assistance;+2 for safety/equipment;Minimal assistance;Ambulation ?Toilet Transfer Details (indicate cue type and reason): simulated with ambulation to the recliner, unable to manage RW on own requiring 2 person HHA ?  ?  ?  ?  ?Functional mobility during ADLs: Minimal assistance;+2 for physical assistance;+2 for safety/equipment;Rolling walker (2 wheels);Cueing for sequencing;Cueing for safety ?General ADL Comments: Patient  requiring significant assist when attempting ambulation, patient tangential, refusing attempts at strategies for home despite increased need for assist that husband cannot provide ?  ? ?Extremity/Trunk Assessment   ?  ?  ?  ?  ?  ? ?Vision   ?  ?   ?Perception   ?  ?Praxis   ?  ? ?Cognition Arousal/Alertness: Awake/alert ?  ?Overall Cognitive Status: Impaired/Different from baseline ?Area of Impairment: Safety/judgement, Awareness, Problem solving ?  ?  ?  ?  ?  ?  ?  ?  ?  ?  ?  ?  ?Safety/Judgement: Decreased awareness of safety, Decreased awareness of deficits ?Awareness: Emergent ?Problem Solving: Slow processing, Requires verbal cues ?General Comments: pt tangential, does not respond well to reflective listening or education. reports significant anxiety about both use of RW and walking. states her husband is unable to assist after recent back surgery and fear of RW. pt with inconsistent presentation of dizziness through session ?  ?  ?   ?Exercises   ? ?  ?Shoulder Instructions   ? ? ?  ?General Comments    ? ? ?Pertinent Vitals/ Pain       Pain Assessment ?Pain Assessment: No/denies pain ? ?Home Living   ?  ?  ?  ?  ?  ?  ?  ?  ?  ?  ?  ?  ?  ?  ?  ?  ?  ?  ? ?  ?Prior Functioning/Environment    ?  ?  ?  ?   ? ?Frequency ? Min 2X/week  ? ? ? ? ?  ?Progress Toward Goals ? ?OT Goals(current goals can now be found in the care plan section) ? Progress towards OT goals: Progressing toward goals ? ?Acute Rehab OT Goals ?Patient Stated Goal: to get back to walking ?OT Goal Formulation: With patient ?Time For Goal Achievement: 12/20/21 ?Potential to Achieve Goals: Fair  ?Plan Discharge plan remains appropriate   ? ?Co-evaluation ? ? ?   ?  ?  ?  ?  ? ?  ?AM-PAC OT "6 Clicks" Daily Activity     ?Outcome Measure ? ? Help from another person eating meals?: None ?Help from another person taking care of personal grooming?: A Little ?Help from another person toileting, which includes using toliet, bedpan, or urinal?: A Lot ?Help from another person bathing (including washing, rinsing, drying)?: A Lot ?Help from another person to put on and taking off regular upper body clothing?: A Little ?Help from another person to put on and taking off regular lower body  clothing?: A Lot ?6 Click Score: 16 ? ?  ?End of Session Equipment Utilized During Treatment: Gait belt;Rolling walker (2 wheels) ? ?OT Visit Diagnosis: Unsteadiness on feet (R26.81);Other abnormalities of gait and mobility (R26.89);Muscle weakness (generalized) (M62.81);Dizziness and giddiness (R42) ?  ?Activity Tolerance Patient limited by fatigue ?  ?Patient Left in chair;with call bell/phone within reach;with chair alarm set ?  ?Nurse Communication Mobility status ?  ? ?   ? ?Time: 7741-2878 ?OT Time Calculation (min): 14 min ? ?Charges: OT General Charges ?$OT Visit: 1 Visit ?OT Treatments ?$Self Care/Home Management : 8-22 mins ? ?Corinne Ports E. [place]ar [place], OTR/L ?Acute Rehabilitation Services ?[phone]  ? ?Corinne Ports Drishti Pepperman ?12/10/2021, 8:10 AM ?

## 2021-12-11 DIAGNOSIS — Z92241 Personal history of systemic steroid therapy: Secondary | ICD-10-CM | POA: Diagnosis not present

## 2021-12-11 DIAGNOSIS — E2689 Other hyperaldosteronism: Secondary | ICD-10-CM | POA: Diagnosis not present

## 2021-12-11 DIAGNOSIS — E2740 Unspecified adrenocortical insufficiency: Secondary | ICD-10-CM | POA: Diagnosis not present

## 2021-12-11 DIAGNOSIS — I10 Essential (primary) hypertension: Secondary | ICD-10-CM | POA: Diagnosis not present

## 2021-12-11 DIAGNOSIS — R42 Dizziness and giddiness: Secondary | ICD-10-CM | POA: Diagnosis not present

## 2022-01-10 DIAGNOSIS — Z92241 Personal history of systemic steroid therapy: Secondary | ICD-10-CM | POA: Diagnosis not present

## 2022-01-10 DIAGNOSIS — I10 Essential (primary) hypertension: Secondary | ICD-10-CM | POA: Diagnosis not present

## 2022-01-10 DIAGNOSIS — E2689 Other hyperaldosteronism: Secondary | ICD-10-CM | POA: Diagnosis not present

## 2022-01-10 DIAGNOSIS — Z780 Asymptomatic menopausal state: Secondary | ICD-10-CM | POA: Diagnosis not present

## 2022-01-10 DIAGNOSIS — E2749 Other adrenocortical insufficiency: Secondary | ICD-10-CM | POA: Diagnosis not present

## 2022-01-11 ENCOUNTER — Other Ambulatory Visit: Payer: Self-pay | Admitting: Internal Medicine

## 2022-01-11 DIAGNOSIS — E2749 Other adrenocortical insufficiency: Secondary | ICD-10-CM

## 2022-01-17 ENCOUNTER — Other Ambulatory Visit: Payer: Self-pay | Admitting: Physician Assistant

## 2022-01-17 DIAGNOSIS — Z1231 Encounter for screening mammogram for malignant neoplasm of breast: Secondary | ICD-10-CM

## 2022-01-31 ENCOUNTER — Ambulatory Visit
Admission: RE | Admit: 2022-01-31 | Discharge: 2022-01-31 | Disposition: A | Discharge status: Home / Self Care | Payer: PPO | Source: Ambulatory Visit | Attending: Internal Medicine | Admitting: Internal Medicine

## 2022-01-31 ENCOUNTER — Ambulatory Visit
Admission: RE | Admit: 2022-01-31 | Discharge: 2022-01-31 | Disposition: A | Discharge status: Home / Self Care | Payer: PPO | Source: Ambulatory Visit | Attending: Physician Assistant | Admitting: Physician Assistant

## 2022-01-31 DIAGNOSIS — Z1231 Encounter for screening mammogram for malignant neoplasm of breast: Secondary | ICD-10-CM | POA: Diagnosis not present

## 2022-01-31 DIAGNOSIS — E2740 Unspecified adrenocortical insufficiency: Secondary | ICD-10-CM | POA: Diagnosis not present

## 2022-01-31 DIAGNOSIS — E2749 Other adrenocortical insufficiency: Secondary | ICD-10-CM

## 2022-01-31 MED ORDER — GADOBENATE DIMEGLUMINE 529 MG/ML IV SOLN
12.0000 mL | Freq: Once | INTRAVENOUS | Status: AC | PRN
  Administered 2022-01-31: 6 mL via INTRAVENOUS

## 2022-02-14 ENCOUNTER — Ambulatory Visit
Admission: RE | Admit: 2022-02-14 | Discharge: 2022-02-14 | Disposition: A | Discharge status: Home / Self Care | Payer: PPO | Source: Ambulatory Visit | Attending: Physician Assistant | Admitting: Physician Assistant

## 2022-02-14 DIAGNOSIS — Z780 Asymptomatic menopausal state: Secondary | ICD-10-CM | POA: Diagnosis not present

## 2022-02-14 DIAGNOSIS — M8589 Other specified disorders of bone density and structure, multiple sites: Secondary | ICD-10-CM | POA: Diagnosis not present

## 2022-02-14 DIAGNOSIS — M8580 Other specified disorders of bone density and structure, unspecified site: Secondary | ICD-10-CM

## 2022-02-23 ENCOUNTER — Other Ambulatory Visit: Payer: Self-pay | Admitting: Physician Assistant

## 2022-03-20 DIAGNOSIS — M810 Age-related osteoporosis without current pathological fracture: Secondary | ICD-10-CM | POA: Diagnosis not present

## 2022-03-22 DIAGNOSIS — S20462A Insect bite (nonvenomous) of left back wall of thorax, initial encounter: Secondary | ICD-10-CM | POA: Diagnosis not present

## 2022-03-26 ENCOUNTER — Telehealth (HOSPITAL_BASED_OUTPATIENT_CLINIC_OR_DEPARTMENT_OTHER): Payer: Self-pay | Admitting: Cardiology

## 2022-03-26 NOTE — Telephone Encounter (Signed)
Scheduled patient for 6 mo. F/U and she stated Dr. Harrell Gave was worried about cholesterol in the past. She has an appt for that in September and wanted to know if Dr. Harrell Gave wanted to see her after instead of before?

## 2022-04-05 DIAGNOSIS — J310 Chronic rhinitis: Secondary | ICD-10-CM | POA: Diagnosis not present

## 2022-04-05 DIAGNOSIS — R42 Dizziness and giddiness: Secondary | ICD-10-CM | POA: Diagnosis not present

## 2022-04-05 DIAGNOSIS — H903 Sensorineural hearing loss, bilateral: Secondary | ICD-10-CM | POA: Diagnosis not present

## 2022-04-05 DIAGNOSIS — R0982 Postnasal drip: Secondary | ICD-10-CM | POA: Diagnosis not present

## 2022-04-05 DIAGNOSIS — H9313 Tinnitus, bilateral: Secondary | ICD-10-CM | POA: Diagnosis not present

## 2022-04-05 DIAGNOSIS — J343 Hypertrophy of nasal turbinates: Secondary | ICD-10-CM | POA: Diagnosis not present

## 2022-04-08 ENCOUNTER — Telehealth: Payer: Self-pay

## 2022-04-08 NOTE — Patient Outreach (Addendum)
  Care Management   Outreach Note  04/08/2022 Name: Anne [name] MRN: [id number] DOB: Mar 08, 1946  An unsuccessful telephone outreach was attempted today. The patient was referred to the case management team for assistance with care management and care coordination.   Follow Up Plan:  Unable to leave a voice message due to patient's voice mailbox being full. A member of the care team will attempt to reach [name] again within the next two weeks.  [place] [facility] Management [phone]

## 2022-04-18 DIAGNOSIS — I10 Essential (primary) hypertension: Secondary | ICD-10-CM | POA: Diagnosis not present

## 2022-04-18 DIAGNOSIS — E2749 Other adrenocortical insufficiency: Secondary | ICD-10-CM | POA: Diagnosis not present

## 2022-04-18 DIAGNOSIS — E2689 Other hyperaldosteronism: Secondary | ICD-10-CM | POA: Diagnosis not present

## 2022-04-22 NOTE — Telephone Encounter (Signed)
Left message to call back  

## 2022-04-23 ENCOUNTER — Ambulatory Visit: Payer: Self-pay

## 2022-04-23 NOTE — Patient Outreach (Signed)
  Care Coordination   04/23/2022 Name: Anne [name] MRN: [id number] DOB: 12-10-1945   Care Coordination Outreach Attempts:  An unsuccessful telephone outreach was attempted today to offer the patient information about available care coordination services as a benefit of their health plan.   Follow Up Plan:  Additional outreach attempts will be made to offer the patient care coordination information and services.   Encounter Outcome:  No Answer  Care Coordination Interventions Activated:  No   Care Coordination Interventions:  No, not indicated     [place] Management [phone]

## 2022-04-24 NOTE — Telephone Encounter (Signed)
Per Dr. Harrell Gave, no lab required prior to appointment  Pt made aware and verbalized understanding

## 2022-04-25 ENCOUNTER — Ambulatory Visit (HOSPITAL_BASED_OUTPATIENT_CLINIC_OR_DEPARTMENT_OTHER): Payer: PPO | Admitting: Cardiology

## 2022-04-30 ENCOUNTER — Ambulatory Visit: Payer: Self-pay

## 2022-04-30 NOTE — Patient Outreach (Signed)
  Care Coordination   04/30/2022 Name: Anne [name] MRN: [id number] DOB: 01/19/1946   Care Coordination Outreach Attempts:  A third unsuccessful outreach was attempted today to offer the patient with information about available care coordination services as a benefit of their health plan.   Follow Up Plan:  No further outreach attempts will be made at this time. We have been unable to contact the patient to offer or enroll patient in care coordination services  Encounter Outcome:  No Answer  Care Coordination Interventions Activated:  No   Care Coordination Interventions:  No, not indicated      [facility] [phone]

## 2022-05-12 DIAGNOSIS — M1990 Unspecified osteoarthritis, unspecified site: Secondary | ICD-10-CM | POA: Diagnosis not present

## 2022-05-17 DIAGNOSIS — Z23 Encounter for immunization: Secondary | ICD-10-CM | POA: Diagnosis not present

## 2022-05-17 DIAGNOSIS — Z0000 Encounter for general adult medical examination without abnormal findings: Secondary | ICD-10-CM | POA: Diagnosis not present

## 2022-05-17 DIAGNOSIS — I10 Essential (primary) hypertension: Secondary | ICD-10-CM | POA: Diagnosis not present

## 2022-05-17 DIAGNOSIS — R252 Cramp and spasm: Secondary | ICD-10-CM | POA: Diagnosis not present

## 2022-05-17 DIAGNOSIS — I6501 Occlusion and stenosis of right vertebral artery: Secondary | ICD-10-CM | POA: Diagnosis not present

## 2022-05-17 DIAGNOSIS — E2689 Other hyperaldosteronism: Secondary | ICD-10-CM | POA: Diagnosis not present

## 2022-05-17 DIAGNOSIS — M810 Age-related osteoporosis without current pathological fracture: Secondary | ICD-10-CM | POA: Diagnosis not present

## 2022-05-17 DIAGNOSIS — I252 Old myocardial infarction: Secondary | ICD-10-CM | POA: Diagnosis not present

## 2022-05-17 DIAGNOSIS — E7800 Pure hypercholesterolemia, unspecified: Secondary | ICD-10-CM | POA: Diagnosis not present

## 2022-05-23 ENCOUNTER — Encounter (HOSPITAL_BASED_OUTPATIENT_CLINIC_OR_DEPARTMENT_OTHER): Payer: Self-pay | Admitting: Cardiology

## 2022-05-23 ENCOUNTER — Telehealth (HOSPITAL_BASED_OUTPATIENT_CLINIC_OR_DEPARTMENT_OTHER): Payer: Self-pay | Admitting: Cardiology

## 2022-05-23 ENCOUNTER — Ambulatory Visit (HOSPITAL_BASED_OUTPATIENT_CLINIC_OR_DEPARTMENT_OTHER): Payer: PPO | Admitting: Cardiology

## 2022-05-23 VITALS — BP 168/92 | HR 79 | Ht 64.0 in | Wt 133.4 lb

## 2022-05-23 DIAGNOSIS — I10 Essential (primary) hypertension: Secondary | ICD-10-CM | POA: Diagnosis not present

## 2022-05-23 DIAGNOSIS — I2510 Atherosclerotic heart disease of native coronary artery without angina pectoris: Secondary | ICD-10-CM

## 2022-05-23 DIAGNOSIS — I152 Hypertension secondary to endocrine disorders: Secondary | ICD-10-CM

## 2022-05-23 DIAGNOSIS — E785 Hyperlipidemia, unspecified: Secondary | ICD-10-CM

## 2022-05-23 DIAGNOSIS — E269 Hyperaldosteronism, unspecified: Secondary | ICD-10-CM | POA: Diagnosis not present

## 2022-05-23 DIAGNOSIS — I252 Old myocardial infarction: Secondary | ICD-10-CM | POA: Diagnosis not present

## 2022-05-23 MED ORDER — NITROGLYCERIN 0.4 MG SL SUBL
0.4000 mg | SUBLINGUAL_TABLET | SUBLINGUAL | 1 refills | Status: DC | PRN
Start: 2022-05-23 — End: 2023-03-10

## 2022-05-23 NOTE — Telephone Encounter (Signed)
Patient arrived to check in to today(05/23/2022) appointment. During this process patient was trying to argue over past appointment and refused that she was 30 minutes late to that appointment back 04/25/2022.

## 2022-05-23 NOTE — Progress Notes (Signed)
Cardiology Office Note:    Date:  05/23/2022   ID:  Anne [name], DOB Oct 27, 1945, MRN [id number]  PCP:  Johna Roles, PA  Cardiologist:  Buford Dresser, MD  Referring MD: Johna Roles, [place]   CC: follow up  History of Present Illness:    Anne [name] is a 76 y.o. female with a hx of CAD with NSTEMI 03/2020 s/p PCI to pLAD, diverticulosis, hiatal hernia, hypercholesterolemia, hypertension, and osteopenia who is seen for cardiology follow up today. She was previously seen by Dr. Martinique, and my initial visit with her was 03/27/21.  Today: Her cholesterol is continuing to be a problem. She had her physical with her PCP and then received a call to make this appointment.  At her initial cardiac event she had pain in the lower back but no classic cardiac symptoms. Since that episode she can not wear restrictive clothing like a bra around her mid area without significant discomfort. She is concerned that this may be due to her stent that was placed as a result of her cardiac event.   She has an appointment to see her Gastroenterologist soon and thinks her cholesterol issues may be related. Her diet does not contain a lot of high cholesterol foods and she monitors her diet carefully to ensure she is not consuming a high lipid diet. She makes sure to follow recommendations on high fiber and quality ingredients in her food. She tries to limit her sodium intake to 1500 mg or less per day. Her weight has also been fairly consistent so she is frustrated that her cholesterol is elevated. She has not been able to tolerate statin medications due to cramps. She will be evaluated for BAM at her next GI appointment.   She experiencing a lot of fatigue due to lack of sleep from home renovations. This has also resulted in a significant increase in her amount of stress.She has not been able to more than about 4 hours a night for the past week. Does not want to increase her spironolactone due  to her potassium levels being elevated when she was on the higher dose previously.   She is concerned about her elevated BP. She checks her blood pressure at home every 2 days or so. At home it ranges 120/70 up to 130/80 when she is more active. The cuft she uses is from her PCP and has been compared to make sure that it was accurate. She has experienced headaches and dizziness. This has corresponded to her blood pressure being elevated.   She denies any palpitations, chest pain, shortness of breath, or peripheral edema. No syncope, orthopnea, or PND.   (+) pain mid back   Past Medical History:  Diagnosis Date   Complication of anesthesia    slow to wake up   Diverticulosis    Hiatal hernia    Hypercholesterolemia    Hypertension    Osteopenia    Type 2 diabetes mellitus with stage 3a chronic kidney disease, without long-term current use of insulin ([place]) 12/05/2021   Vitamin D deficiency     Past Surgical History:  Procedure Laterality Date   ABDOMINAL HYSTERECTOMY     Partial   AUGMENTATION MAMMAPLASTY     BREAST BIOPSY Left 1975   BREAST BIOPSY Right 1975   CORONARY STENT INTERVENTION N/A 03/16/2020   Procedure: CORONARY STENT INTERVENTION;  Surgeon: Burnell Blanks, MD;  Location: [facility];  Service: Cardiovascular;  Laterality: N/A;   fibrocystic  breast excision     LAPAROSCOPIC ILEOCECECTOMY  07/07/2015   LAPAROTOMY N/A 07/07/2015   Procedure: EXPLORATORY LAPAROTOMY WITH ILEOCECECTOMY ;  Surgeon: Donnie Mesa, MD;  Location: [facility];  Service: General;  Laterality: N/A;   LEFT HEART CATH AND CORONARY ANGIOGRAPHY N/A 03/16/2020   Procedure: LEFT HEART CATH AND CORONARY ANGIOGRAPHY;  Surgeon: Burnell Blanks, MD;  Location: [facility];  Service: Cardiovascular;  Laterality: N/A;   TUBAL LIGATION      Current Medications: Current Outpatient Medications on File Prior to Visit  Medication Sig   [employer] ASPIRIN [name] DOSE 81 MG EC tablet TAKE 1  TABLET (81 MG TOTAL) BY MOUTH DAILY. SWALLOW WHOLE. (Patient taking differently: Take 81 mg by mouth daily.)   meclizine (ANTIVERT) 25 MG tablet Take 1 tablet three times daily (every 8 hours) for 3 days and then take as needed for dizziness   nitroGLYCERIN (NITROSTAT) 0.4 MG SL tablet Place 1 tablet (0.4 mg total) under the tongue every 5 (five) minutes as needed for chest pain. May repeat 2 times, if 3rd dose needed call 911   spironolactone (ALDACTONE) 25 MG tablet TAKE 1 TABLET BY MOUTH TWICE A DAY   Vitamin D, Ergocalciferol, (DRISDOL) 1.25 MG (50000 UNIT) CAPS capsule Take 50,000 Units by mouth every 7 (seven) days.   No current facility-administered medications on file prior to visit.     Allergies:   Amiloride; Anesthetics, amide; Other; Prilocaine; Amlodipine besylate; Augmentin [amoxicillin-pot clavulanate]; Cardizem [diltiazem hcl]; Cefdinir; Diltiazem hcl; Eplerenone; Indomethacin; Naproxen; Tenex [guanfacine hcl]; Triamterene-hctz; Cephalexin; Hydralazine hcl; Nebivolol hcl; Potassium chloride; Hytrin [terazosin hcl]; and Lisinopril   Social History   Tobacco Use   Smoking status: Never   Smokeless tobacco: Never  Vaping Use   Vaping Use: Never used  Substance Use Topics   Alcohol use: Yes    Comment: 3 times a year   Drug use: No    Family History: family history includes Hypertension in her mother; Stroke in her brother and mother. There is no history of Cancer, Alcohol abuse, Early death, Hearing loss, Heart disease, Hyperlipidemia, or Kidney disease.  ROS:   Please see the history of present illness.   (+) pain mid back  (+) fatigue (+) lightheadedness (+) insomnia  Additional pertinent ROS EKGs/Labs/Other Studies Reviewed:    The following studies were reviewed today:  Echo 12/05/2021: IMPRESSIONS    1. Left ventricular ejection fraction, by estimation, is 55 to 60%. The  left ventricle has normal function. The left ventricle has no regional  wall motion  abnormalities. Left ventricular diastolic parameters were  normal.   2. Right ventricular systolic function is normal. The right ventricular  size is normal. There is normal pulmonary artery systolic pressure. The  estimated right ventricular systolic pressure is 16.1 mmHg.   3. The mitral valve is normal in structure. Trivial mitral valve  regurgitation. No evidence of mitral stenosis.   4. The aortic valve is tricuspid. Aortic valve regurgitation is trivial.  No aortic stenosis is present.   5. The inferior vena cava is normal in size with <50% respiratory  variability, suggesting right atrial pressure of 8 mmHg.   LHC 03/16/2020: Prox RCA lesion is 20% stenosed. Mid Cx to Dist Cx lesion is 20% stenosed. Prox LAD lesion is 95% stenosed. A drug-eluting stent was successfully placed using a SYNERGY XD 3.50X12. Post intervention, there is a 0% residual stenosis.   1. Severe stenosis proximal LAD 2. Successful PTCA/DES x 1 proximal LAD  3. Mild non-obstructive disease in the Circumflex and RCA   Recommendations: DAPT with ASA and Plavix for one year. Continue statin and beta blocker. Likely d/c home tomorrow.  Echo 03/16/2020:  1. Left ventricular ejection fraction, by estimation, is 55 to 60%. The  left ventricle has normal function. The left ventricle has no regional  wall motion abnormalities. There is mild left ventricular hypertrophy.  Left ventricular diastolic parameters  are consistent with Grade I diastolic dysfunction (impaired relaxation).   2. Right ventricular systolic function is normal. The right ventricular  size is normal. There is normal pulmonary artery systolic pressure. The  estimated right ventricular systolic pressure is 94.8 mmHg.   3. Left atrial size was mildly dilated.   4. The mitral valve is normal in structure. No evidence of mitral valve  regurgitation. No evidence of mitral stenosis.   5. The aortic valve is tricuspid. Aortic valve regurgitation is not   visualized. No aortic stenosis is present.   6. The inferior vena cava is normal in size with greater than 50%  respiratory variability, suggesting right atrial pressure of 3 mmHg.  EKG:  EKG is personally reviewed.  05/23/2022: not ordered today 03/27/2021: none ordered today 03/01/21: NSR 77 bpm  Recent Labs: 12/07/2021: ALT 12; BUN 11; Creatinine, Ser 1.07; Hemoglobin 11.5; Magnesium 1.8; Platelets 183; Potassium 3.7; Sodium 137  Recent Lipid Panel    Component Value Date/Time   CHOL 164 12/05/2021 0211   CHOL 206 (H) 03/28/2021 0844   TRIG 31 12/05/2021 0211   HDL 60 12/05/2021 0211   HDL 78 03/28/2021 0844   CHOLHDL 2.7 12/05/2021 0211   VLDL 6 12/05/2021 0211   LDLCALC 98 12/05/2021 0211   LDLCALC 118 (H) 03/28/2021 0844   LDLDIRECT 158.7 09/30/2013 1255    Physical Exam:    VS:  BP (!) 168/92 (BP Location: Right Arm, Patient Position: Sitting, Cuff Size: Normal)   Pulse 79   Ht '5\' 4"'$  (1.626 m)   Wt 133 lb 6.4 oz (60.5 kg)   SpO2 98%   BMI 22.90 kg/m     Wt Readings from Last 3 Encounters:  05/23/22 133 lb 6.4 oz (60.5 kg)  12/06/21 124 lb 3.2 oz (56.3 kg)  12/03/21 125 lb (56.7 kg)    GEN: Well nourished, well developed in no acute distress HEENT: Normal, moist mucous membranes NECK: No JVD CARDIAC: regular rhythm, normal S1 and S2, no rubs or gallops. No murmur. VASCULAR: Radial and DP pulses 2+ bilaterally. No carotid bruits RESPIRATORY:  Clear to auscultation without rales, wheezing or rhonchi  ABDOMEN: Soft, non-tender, non-distended MUSCULOSKELETAL:  Ambulates independently SKIN: Warm and dry, no edema NEUROLOGIC:  Alert and oriented x 3. No focal neuro deficits noted. PSYCHIATRIC:  Normal affect    ASSESSMENT:    1. Coronary artery disease involving native coronary artery of native heart without angina pectoris   2. History of non-ST elevation myocardial infarction (NSTEMI)   3. Hyperaldosteronism ([place])   4. Essential hypertension   5.  Hyperlipidemia LDL goal <70   6. Hypertension due to endocrine disorder    PLAN:    CAD, without current angina History of NSTEMI 03/2020: -on aspirin, had mild side effects on clopidogrel that have resolved since completing 1 year of DAPT -no angina, has old SL NG, will refill today -reviewed red flag warning signs that need immediate medical attention.  Hypercholesterolemia -LDL goal <70.  -she did not tolerate atorvastatin or ezetimibe, feeling that it affected her strength -  we discussed statin alternatives at length today. Discussed that she is high risk given her known CAD and prior NSTEMI.  -we discussed specifically nexletol and PCSK9i today. She is more interested in the PCSK9i, but she is concerned about side effects and possible reactions. Discussed this today. Offered to get her in with our PharmD and potentially take first dose in the office. 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 She reports that this is the maximum dose she tolerates -not at goal today, though reports home numbers are better -she will log home blood pressures and bring these to her follow up visit -has extensive medication intolerances, reviewed.  Cardiac risk counseling and prevention recommendations: -recommend heart healthy/Mediterranean diet, with whole grains, fruits, vegetable, fish, lean meats, nuts, and olive oil. Limit salt. -recommend moderate walking, 3-5 times/week for 30-50 minutes each session. Aim for at least 150 minutes.week. Goal should be pace of 3 miles/hours, or walking 1.5 miles in 30 minutes -recommend avoidance of tobacco products. Avoid excess alcohol.  Plan for follow up: 6-8 weeks   Buford Dresser, MD, PhD, [facility]    Medication Adjustments/Labs and Tests Ordered: Current medicines are reviewed at length with the patient today.  Concerns regarding medicines are outlined  above.  No orders of the defined types were placed in this encounter.  Meds ordered this encounter  Medications   nitroGLYCERIN (NITROSTAT) 0.4 MG SL tablet    Sig: Place 1 tablet (0.4 mg total) under the tongue every 5 (five) minutes as needed for chest pain. May repeat 2 times, if 3rd dose needed call 911    Dispense:  25 tablet    Refill:  1     Patient Instructions  Medication Instructions:  Your Physician recommend you continue on your current medication as directed.    *If you need a refill on your cardiac medications before your next appointment, please call your pharmacy*   Lab Work: None ordered today   Testing/Procedures: None ordered today   Follow-Up: At [facility], you and your health needs are our priority.  As part of our continuing mission to provide you with exceptional heart care, we have created designated Provider Care Teams.  These Care Teams include your primary Cardiologist (physician) and Advanced Practice Providers (APPs -  Physician Assistants and Nurse Practitioners) who all work together to provide you with the care you need, when you need it.  We recommend signing up for the patient portal called "MyChart".  Sign up information is provided on this After Visit Summary.  MyChart is used to connect with patients for Virtual Visits (Telemedicine).  Patients are able to view lab/test results, encounter notes, upcoming appointments, etc.  Non-urgent messages can be sent to your provider as well.   To learn more about what you can do with MyChart, go to [email or web address].    Your next appointment:   6-8 week(s)  The format for your next appointment:   In Person  Provider:   Buford Dresser, MD              Burnett Kanaris Ford,acting as a scribe for Buford Dresser, MD.,have documented all relevant documentation on the behalf of Buford Dresser, MD,as directed by  Buford Dresser, MD while in the presence of  Buford Dresser, MD.   I, Buford Dresser, MD, have reviewed all documentation for this visit. The documentation on 05/23/22 for the exam, diagnosis, procedures, and orders  are all accurate and complete.   Signed, Buford Dresser, MD PhD 05/23/2022     [place]

## 2022-05-23 NOTE — Patient Instructions (Signed)
Medication Instructions:  Your Physician recommend you continue on your current medication as directed.    *If you need a refill on your cardiac medications before your next appointment, please call your pharmacy*   Lab Work: None ordered today   Testing/Procedures: None ordered today   Follow-Up: At [facility], you and your health needs are our priority.  As part of our continuing mission to provide you with exceptional heart care, we have created designated Provider Care Teams.  These Care Teams include your primary Cardiologist (physician) and Advanced Practice Providers (APPs -  Physician Assistants and Nurse Practitioners) who all work together to provide you with the care you need, when you need it.  We recommend signing up for the patient portal called "MyChart".  Sign up information is provided on this After Visit Summary.  MyChart is used to connect with patients for Virtual Visits (Telemedicine).  Patients are able to view lab/test results, encounter notes, upcoming appointments, etc.  Non-urgent messages can be sent to your provider as well.   To learn more about what you can do with MyChart, go to [email or web address].    Your next appointment:   6-8 week(s)  The format for your next appointment:   In Person  Provider:   Buford Dresser, MD

## 2022-06-05 ENCOUNTER — Ambulatory Visit
Admission: RE | Admit: 2022-06-05 | Discharge: 2022-06-05 | Disposition: A | Discharge status: Home / Self Care | Payer: PPO | Source: Ambulatory Visit | Attending: Gastroenterology | Admitting: Gastroenterology

## 2022-06-05 ENCOUNTER — Ambulatory Visit (HOSPITAL_BASED_OUTPATIENT_CLINIC_OR_DEPARTMENT_OTHER): Payer: PPO | Admitting: Cardiology

## 2022-06-05 ENCOUNTER — Other Ambulatory Visit: Payer: Self-pay | Admitting: Gastroenterology

## 2022-06-05 DIAGNOSIS — K5900 Constipation, unspecified: Secondary | ICD-10-CM

## 2022-06-05 DIAGNOSIS — M419 Scoliosis, unspecified: Secondary | ICD-10-CM | POA: Diagnosis not present

## 2022-06-05 DIAGNOSIS — N3941 Urge incontinence: Secondary | ICD-10-CM | POA: Diagnosis not present

## 2022-06-05 DIAGNOSIS — R197 Diarrhea, unspecified: Secondary | ICD-10-CM | POA: Diagnosis not present

## 2022-06-12 ENCOUNTER — Ambulatory Visit (HOSPITAL_BASED_OUTPATIENT_CLINIC_OR_DEPARTMENT_OTHER): Payer: PPO | Admitting: Cardiology

## 2022-08-30 DIAGNOSIS — R634 Abnormal weight loss: Secondary | ICD-10-CM | POA: Diagnosis not present

## 2022-08-30 DIAGNOSIS — I10 Essential (primary) hypertension: Secondary | ICD-10-CM | POA: Diagnosis not present

## 2022-08-30 DIAGNOSIS — H8113 Benign paroxysmal vertigo, bilateral: Secondary | ICD-10-CM | POA: Diagnosis not present

## 2022-08-30 DIAGNOSIS — I252 Old myocardial infarction: Secondary | ICD-10-CM | POA: Diagnosis not present

## 2022-09-06 DIAGNOSIS — R9431 Abnormal electrocardiogram [ECG] [EKG]: Secondary | ICD-10-CM | POA: Diagnosis not present

## 2022-09-06 DIAGNOSIS — E2689 Other hyperaldosteronism: Secondary | ICD-10-CM | POA: Diagnosis not present

## 2022-09-06 DIAGNOSIS — I10 Essential (primary) hypertension: Secondary | ICD-10-CM | POA: Diagnosis not present

## 2022-09-20 DIAGNOSIS — I10 Essential (primary) hypertension: Secondary | ICD-10-CM | POA: Diagnosis not present

## 2022-09-20 DIAGNOSIS — E2740 Unspecified adrenocortical insufficiency: Secondary | ICD-10-CM | POA: Diagnosis not present

## 2022-09-20 DIAGNOSIS — R413 Other amnesia: Secondary | ICD-10-CM | POA: Diagnosis not present

## 2022-10-02 ENCOUNTER — Other Ambulatory Visit: Payer: Self-pay | Admitting: Cardiology

## 2022-10-02 NOTE — Telephone Encounter (Signed)
Rx request sent to pharmacy.  

## 2022-10-22 DIAGNOSIS — Z713 Dietary counseling and surveillance: Secondary | ICD-10-CM | POA: Diagnosis not present

## 2022-10-22 DIAGNOSIS — I10 Essential (primary) hypertension: Secondary | ICD-10-CM | POA: Diagnosis not present

## 2022-10-30 DIAGNOSIS — I10 Essential (primary) hypertension: Secondary | ICD-10-CM | POA: Diagnosis not present

## 2022-10-30 DIAGNOSIS — R0982 Postnasal drip: Secondary | ICD-10-CM | POA: Diagnosis not present

## 2022-10-30 DIAGNOSIS — E2689 Other hyperaldosteronism: Secondary | ICD-10-CM | POA: Diagnosis not present

## 2022-11-15 DIAGNOSIS — E2689 Other hyperaldosteronism: Secondary | ICD-10-CM | POA: Diagnosis not present

## 2022-11-15 DIAGNOSIS — E2740 Unspecified adrenocortical insufficiency: Secondary | ICD-10-CM | POA: Diagnosis not present

## 2022-11-15 DIAGNOSIS — I10 Essential (primary) hypertension: Secondary | ICD-10-CM | POA: Diagnosis not present

## 2022-11-19 DIAGNOSIS — I10 Essential (primary) hypertension: Secondary | ICD-10-CM | POA: Diagnosis not present

## 2022-11-19 DIAGNOSIS — Z713 Dietary counseling and surveillance: Secondary | ICD-10-CM | POA: Diagnosis not present

## 2022-12-24 DIAGNOSIS — I10 Essential (primary) hypertension: Secondary | ICD-10-CM | POA: Diagnosis not present

## 2022-12-24 DIAGNOSIS — Z713 Dietary counseling and surveillance: Secondary | ICD-10-CM | POA: Diagnosis not present

## 2022-12-27 DIAGNOSIS — I252 Old myocardial infarction: Secondary | ICD-10-CM | POA: Diagnosis not present

## 2022-12-27 DIAGNOSIS — M549 Dorsalgia, unspecified: Secondary | ICD-10-CM | POA: Diagnosis not present

## 2022-12-27 DIAGNOSIS — I10 Essential (primary) hypertension: Secondary | ICD-10-CM | POA: Diagnosis not present

## 2023-01-14 ENCOUNTER — Telehealth: Payer: Self-pay

## 2023-01-14 ENCOUNTER — Other Ambulatory Visit (INDEPENDENT_AMBULATORY_CARE_PROVIDER_SITE_OTHER): Payer: PPO

## 2023-01-14 DIAGNOSIS — E269 Hyperaldosteronism, unspecified: Secondary | ICD-10-CM

## 2023-01-14 LAB — BASIC METABOLIC PANEL
BUN: 9 mg/dL (ref 6–23)
CO2: 30 mEq/L (ref 19–32)
Calcium: 9.3 mg/dL (ref 8.4–10.5)
Chloride: 102 mEq/L (ref 96–112)
Creatinine, Ser: 0.92 mg/dL (ref 0.40–1.20)
GFR: 61.14 mL/min (ref >60.00)
Glucose, Bld: 85 mg/dL (ref 70–99)
Potassium: 4 mEq/L (ref 3.5–5.1)
Sodium: 139 mEq/L (ref 135–145)

## 2023-01-14 LAB — CORTISOL: Cortisol, Plasma: 7.6 ug/dL

## 2023-01-14 NOTE — Telephone Encounter (Signed)
Orders Placed This Encounter  Procedures   ACTH    Standing Status:   Future    Standing Expiration Date:   07/17/2023   Cortisol    Standing Status:   Future    Standing Expiration Date:   07/17/2023   Aldosterone    Standing Status:   Future    Standing Expiration Date:   07/17/2023    Order Specific Question:   Patient Supine or Upright?    Answer:   UPRIGHT   Metanephrines, plasma    Standing Status:   Future    Standing Expiration Date:   07/17/2023   Basic metabolic panel    Standing Status:   Future    Standing Expiration Date:   07/17/2023   DHEA-sulfate    Standing Status:   Future    Standing Expiration Date:   01/14/2024

## 2023-01-15 DIAGNOSIS — E2689 Other hyperaldosteronism: Secondary | ICD-10-CM | POA: Diagnosis not present

## 2023-01-15 DIAGNOSIS — I10 Essential (primary) hypertension: Secondary | ICD-10-CM | POA: Diagnosis not present

## 2023-01-19 LAB — METANEPHRINES, PLASMA
Metanephrine, Free: 25 pg/mL (ref <=57)
Normetanephrine, Free: 200 pg/mL — ABNORMAL HIGH (ref <=148)
Total Metanephrines-Plasma: 225 pg/mL — ABNORMAL HIGH (ref <=205)

## 2023-01-19 LAB — DHEA-SULFATE: DHEA-SO4: 26 ug/dL (ref 4–157)

## 2023-01-19 LAB — ACTH: C206 ACTH: 11 pg/mL (ref 6–50)

## 2023-01-19 LAB — ALDOSTERONE: Aldosterone: 8 ng/dL

## 2023-01-20 ENCOUNTER — Other Ambulatory Visit: Payer: Self-pay | Admitting: Physician Assistant

## 2023-01-20 ENCOUNTER — Ambulatory Visit: Payer: PPO | Admitting: "Endocrinology

## 2023-01-20 DIAGNOSIS — Z1231 Encounter for screening mammogram for malignant neoplasm of breast: Secondary | ICD-10-CM

## 2023-01-27 ENCOUNTER — Encounter: Payer: Self-pay | Admitting: "Endocrinology

## 2023-01-27 ENCOUNTER — Ambulatory Visit: Payer: PPO | Admitting: "Endocrinology

## 2023-01-27 VITALS — BP 160/80 | HR 72 | Ht 64.0 in | Wt 131.0 lb

## 2023-01-27 DIAGNOSIS — I10 Essential (primary) hypertension: Secondary | ICD-10-CM | POA: Diagnosis not present

## 2023-01-27 MED ORDER — METOPROLOL TARTRATE 50 MG PO TABS: 50.0000 mg | ORAL_TABLET | Freq: Two times a day (BID) | ORAL | 1 refills | Status: DC

## 2023-01-27 NOTE — Addendum Note (Signed)
Addended by: [facility] on: 01/27/2023 10:55 AM   Modules accepted: Orders, Level of Service

## 2023-01-27 NOTE — Progress Notes (Addendum)
Outpatient Endocrinology Note Anne Verona, MD    Anne [name] 05/26/1946 [id number]  Referring Provider: Delma Officer, PA Primary Care Provider: Delma Officer, PA Reason for consultation: Subjective   Assessment & Plan  Diagnoses and all orders for this visit:  Uncontrolled hypertension -     Cancel: Renin; Future -     Cancel: Aldosterone; Future -     Basic metabolic panel; Future -     Aldosterone; Future -     Renin; Future  Other orders -     metoprolol tartrate (LOPRESSOR) 50 MG tablet; Take 1 tablet (50 mg total) by mouth 2 (two) times daily.   Patient here to rule out hyperaldosteronism Has been on spironolactone 25 mg for past 2 years, stopped taking about 3 weeks ago About 2 weeks ago she had baseline blood work done which showed aldosterone of 2, potassium of 4, no renin Patient is not currently taking any blood pressure medication Her blood pressure is running between systolic 1 60-1 80 She reports poor tolerance in the past to various blood pressure medications and reports that she needs a diuretic Recommend holding spironolactone for another 6 weeks and repeating testing, low suspicion is given that patient had no history of hypokalemia in the past and current aldosterone being normal while spironolactone was off, which was low-dose to begin with Patient is going to go across the office to her PCP right away to get treated for her blood pressure with medications other than spironolactone  Current BP 185/89 Not taking any BP meds Pt unable to get to PCP currently Ordered metoprolol 50 mg BID (history of disorientation with nebivolol, but pt without it is also experiencing some disorientation and clear dizziness with high BP) Pt reports poor tolerance to BP meds in past Reported in allergy list: amiloride, amlodipine, cardizem, diltiazem, eplerenone, hydralazine and lisinopril. Self stopped spironolactone too Instructed to go to urgent  care now/if cannot tolerate metoprolol or BP remains >160/90 Repeat labs in 4 weeks (off of spironolacone for 7 wks)  Return for visit + labs before next visit.   I have reviewed current medications, nurse's notes, allergies, vital signs, past medical and surgical history, family medical history, and social history for this encounter. Counseled patient on symptoms, examination findings, lab findings, imaging results, treatment decisions and monitoring and prognosis. The patient understood the recommendations and agrees with the treatment plan. All questions regarding treatment plan were fully answered.  Anne Pollock, MD  01/27/23   History of Present Illness HPI  Anne [name] is a 77 y.o. female  referred by Anne [name] for evaluation and management of "other hyperaldosteronism".   No history of adrenal tumors.  Pt is concerned about her BP control. She last took on spironolactone 25 mg qd around 3 or more weeks ago. She had been on it 2 years.  Current regimen: none  She  Denies abdominal pain  Weight loss happened when pt went on restrictive diet   a history of HTN: yes   a history of hypokalemia: No  paroxysmal episodes of anxiety: no  Tremors: no  Lightheadedness: only if BP is high   Headache: no  Palpitation: no  Sweating: no  blurry vision: no  Pt has tried BP medications in past and has poor tolerance to it    Physical Exam  BP (!) 160/80 (BP Location: Left Arm, Patient Position: Sitting, Cuff Size: Normal)   Pulse 72   Ht 5\' 4"  (  1.626 m)   Wt 131 lb (59.4 kg)   SpO2 99%   BMI 22.49 kg/m    Constitutional: well developed, well nourished Head: normocephalic, atraumatic Eyes: sclera anicteric, no redness Neck: supple Lungs: normal respiratory effort Neurology: alert and oriented Skin: dry, no appreciable rashes Musculoskeletal: no appreciable defects Psychiatric: normal mood and affect   Current Medications Patient's Medications  New  Prescriptions   METOPROLOL TARTRATE (LOPRESSOR) 50 MG TABLET    Take 1 tablet (50 mg total) by mouth 2 (two) times daily.  Previous Medications   ALENDRONATE (FOSAMAX) 70 MG TABLET    Take by mouth.   AMLODIPINE (NORVASC) 2.5 MG TABLET    Take 2.5 mg by mouth daily.   ASPIRIN EC 81 MG TABLET    TAKE 1 TABLET (81 MG TOTAL) BY MOUTH DAILY. SWALLOW WHOLE.   MECLIZINE (ANTIVERT) 25 MG TABLET    Take 1 tablet three times daily (every 8 hours) for 3 days and then take as needed for dizziness   NITROGLYCERIN (NITROSTAT) 0.4 MG SL TABLET    Place 1 tablet (0.4 mg total) under the tongue every 5 (five) minutes as needed for chest pain. May repeat 2 times, if 3rd dose needed call 911   SPIRONOLACTONE (ALDACTONE) 25 MG TABLET    TAKE 1 TABLET BY MOUTH TWICE A DAY   VITAMIN D, ERGOCALCIFEROL, (DRISDOL) 1.25 MG (50000 UNIT) CAPS CAPSULE    Take 50,000 Units by mouth every 7 (seven) days.  Modified Medications   No medications on file  Discontinued Medications   No medications on file    Allergies Allergies  Allergen Reactions   Amiloride Other (See Comments)    Hair loss   Anesthetics, Amide Other (See Comments)    Lethargic and very slow reanimation lasting a full day or so   Other Other (See Comments)    Very hard to awake from anaesthetic with surgery. Acute renal failure, hypokalemia Very hard to awake from anaesthetic with surgery.   Prilocaine Other (See Comments)    Lethargic and very slow reanimation lasting a full day or so   Amlodipine Besylate Swelling    Severe edema / Headaches   Augmentin [Amoxicillin-Pot Clavulanate] Other (See Comments)    Pain in hips and legs   Cardizem [Diltiazem Hcl] Nausea And Vomiting    Hospitalized when it occurred   Cefdinir Nausea And Vomiting   Diltiazem Hcl Nausea And Vomiting    Hospitalized when it occurred    Eplerenone Other (See Comments)    Hair loss   Indomethacin Other (See Comments)    esophagitis   Naproxen Other (See Comments)     Gastric intolerance   Tenex [Guanfacine Hcl] Other (See Comments)    Chronic insomnia    Triamterene-Hctz Other (See Comments)    Acute renal failure, hypokalemia    Cephalexin Other (See Comments)   Hydralazine Hcl Other (See Comments)    Fluid retention   Nebivolol Hcl     Other reaction(s): disoriented   Potassium Chloride Other (See Comments)    Severe hair loss, baldness   Hytrin [Terazosin Hcl] Other (See Comments)    Pt states she can not take this med but can not relate any side effect or incident that occurred   Lisinopril Other (See Comments)    Unknown, pt says she "can't take it", cough/hypotension and thirst    Past Medical History Past Medical History:  Diagnosis Date   Complication of anesthesia    slow to  wake up   Diverticulosis    Hiatal hernia    Hypercholesterolemia    Hypertension    Osteopenia    Type 2 diabetes mellitus with stage 3a chronic kidney disease, without long-term current use of insulin (HCC) 12/05/2021   Vitamin D deficiency     Past Surgical History Past Surgical History:  Procedure Laterality Date   ABDOMINAL HYSTERECTOMY     Partial   AUGMENTATION MAMMAPLASTY     BREAST BIOPSY Left 1975   BREAST BIOPSY Right 1975   CORONARY STENT INTERVENTION N/A 03/16/2020   Procedure: CORONARY STENT INTERVENTION;  Surgeon: Kathleene Hazel, MD;  Location: [facility];  Service: Cardiovascular;  Laterality: N/A;   fibrocystic breast excision     LAPAROSCOPIC ILEOCECECTOMY  07/07/2015   LAPAROTOMY N/A 07/07/2015   Procedure: EXPLORATORY LAPAROTOMY WITH ILEOCECECTOMY ;  Surgeon: Manus Rudd, MD;  Location: [facility] OR;  Service: General;  Laterality: N/A;   LEFT HEART CATH AND CORONARY ANGIOGRAPHY N/A 03/16/2020   Procedure: LEFT HEART CATH AND CORONARY ANGIOGRAPHY;  Surgeon: Kathleene Hazel, MD;  Location: [facility];  Service: Cardiovascular;  Laterality: N/A;   TUBAL LIGATION      Family History family history  includes Hypertension in her mother; Stroke in her brother and mother.  Social History Social History   Socioeconomic History   Marital status: Married    Spouse name: Not on file   Number of children: 2   Years of education: Not on file   Highest education level: Not on file  Occupational History   Occupation: retired  Tobacco Use   Smoking status: Never   Smokeless tobacco: Never  Vaping Use   Vaping Use: Never used  Substance and Sexual Activity   Alcohol use: Yes    Comment: 3 times a year   Drug use: No   Sexual activity: Yes    Birth control/protection: Surgical  Other Topics Concern   Not on file  Social History Narrative   Not on file   Social Determinants of Health   Financial Resource Strain: Not on file  Food Insecurity: Not on file  Transportation Needs: Not on file  Physical Activity: Not on file  Stress: Not on file  Social Connections: Not on file  Intimate Partner Violence: Not on file   Component     Latest Ref Rng 01/14/2023  Calcium     8.4 - 10.5 mg/dL 9.3   Metanephrine, Pl     <=57 pg/mL 25   Normetanephrine, Pl     <=148 pg/mL 200 (H)   Total Metanephrines-Plasma     <=205 pg/mL 225 (H)   DHEA-SO4     4 - 157 mcg/dL 26   ALDOSTERONE     - ng/dL 8   Cortisol, Plasma     ug/dL 7.6   A540 ACTH     6 - 50 pg/mL 11     Legend: (H) High  Lab Results  Component Value Date   CHOL 164 12/05/2021   Lab Results  Component Value Date   HDL 60 12/05/2021   Lab Results  Component Value Date   LDLCALC 98 12/05/2021   Lab Results  Component Value Date   TRIG 31 12/05/2021   Lab Results  Component Value Date   CHOLHDL 2.7 12/05/2021   Lab Results  Component Value Date   CREATININE 0.92 01/14/2023   Lab Results  Component Value Date   GFR 61.14 01/14/2023  Component Value Date/Time   NA 139 01/14/2023 1140   NA 140 03/08/2021 0926   K 4.0 01/14/2023 1140   CL 102 01/14/2023 1140   CO2 30 01/14/2023 1140   GLUCOSE  85 01/14/2023 1140   BUN 9 01/14/2023 1140   BUN 12 03/08/2021 0926   CREATININE 0.92 01/14/2023 1140   CALCIUM 9.3 01/14/2023 1140   PROT 6.1 (L) 12/07/2021 0531   ALBUMIN 3.2 (L) 12/07/2021 0531   AST 19 12/07/2021 0531   ALT 12 12/07/2021 0531   ALKPHOS 46 12/07/2021 0531   BILITOT 1.1 12/07/2021 0531   GFRNONAA 55 (L) 12/07/2021 0531   GFRAA >60 04/18/2020 1427      Latest Ref Rng & Units 01/14/2023   11:40 AM 12/07/2021    5:31 AM 12/05/2021    2:11 AM  BMP  Glucose 70 - 99 mg/dL 85  85  95   BUN 6 - 23 mg/dL 9  11  13    Creatinine 0.40 - 1.20 mg/dL 1.61  0.96  0.45   Sodium 135 - 145 mEq/L 139  137  138   Potassium 3.5 - 5.1 mEq/L 4.0  3.7  3.6   Chloride 96 - 112 mEq/L 102  105  107   CO2 19 - 32 mEq/L 30  27  22    Calcium 8.4 - 10.5 mg/dL 9.3  8.9  9.0        Component Value Date/Time   WBC 3.0 (L) 12/07/2021 0531   RBC 3.89 12/07/2021 0531   HGB 11.5 (L) 12/07/2021 0531   HGB 11.8 03/01/2021 1629   HGB 10.0 (L) 03/16/2010 1520   HCT 32.6 (L) 12/07/2021 0531   HCT 35.8 03/01/2021 1629   HCT 29.1 (L) 03/16/2010 1520   PLT 183 12/07/2021 0531   PLT 203 03/01/2021 1629   MCV 83.8 12/07/2021 0531   MCV 88 03/01/2021 1629   MCV 79.5 03/16/2010 1520   MCH 29.6 12/07/2021 0531   MCHC 35.3 12/07/2021 0531   RDW 13.2 12/07/2021 0531   RDW 14.0 03/01/2021 1629   RDW 14.9 (H) 03/16/2010 1520   LYMPHSABS 1.4 12/05/2021 0250   LYMPHSABS 1.5 03/16/2010 1520   MONOABS 0.3 12/05/2021 0250   MONOABS 0.3 03/16/2010 1520   EOSABS 0.1 12/05/2021 0250   EOSABS 0.1 03/16/2010 1520   BASOSABS 0.0 12/05/2021 0250   BASOSABS 0.0 03/16/2010 1520   Lab Results  Component Value Date   TSH 1.960 03/01/2021   TSH 2.747 12/21/2019   TSH 1.43 09/30/2013         Parts of this note may have been dictated using voice recognition software. There may be variances in spelling and vocabulary which are unintentional. Not all errors are proofread. Please notify the [facility] Parkin if any  discrepancies are noted or if the meaning of any statement is not clear.

## 2023-02-04 ENCOUNTER — Other Ambulatory Visit: Payer: Self-pay | Admitting: Physician Assistant

## 2023-02-04 ENCOUNTER — Ambulatory Visit
Admission: RE | Admit: 2023-02-04 | Discharge: 2023-02-04 | Disposition: A | Discharge status: Home / Self Care | Payer: PPO | Source: Ambulatory Visit | Attending: Physician Assistant | Admitting: Physician Assistant

## 2023-02-04 DIAGNOSIS — Z1231 Encounter for screening mammogram for malignant neoplasm of breast: Secondary | ICD-10-CM

## 2023-02-06 ENCOUNTER — Ambulatory Visit: Payer: PPO | Admitting: "Endocrinology

## 2023-02-18 ENCOUNTER — Other Ambulatory Visit: Payer: Self-pay | Admitting: "Endocrinology

## 2023-02-25 ENCOUNTER — Other Ambulatory Visit (INDEPENDENT_AMBULATORY_CARE_PROVIDER_SITE_OTHER): Payer: PPO

## 2023-02-25 DIAGNOSIS — I10 Essential (primary) hypertension: Secondary | ICD-10-CM

## 2023-02-25 LAB — BASIC METABOLIC PANEL
BUN: 10 mg/dL (ref 6–23)
CO2: 28 mEq/L (ref 19–32)
Calcium: 9.1 mg/dL (ref 8.4–10.5)
Chloride: 103 mEq/L (ref 96–112)
Creatinine, Ser: 0.93 mg/dL (ref 0.40–1.20)
GFR: 60.31 mL/min (ref >60.00)
Glucose, Bld: 93 mg/dL (ref 70–99)
Potassium: 3.5 mEq/L (ref 3.5–5.1)
Sodium: 139 mEq/L (ref 135–145)

## 2023-02-27 ENCOUNTER — Other Ambulatory Visit: Payer: Self-pay | Admitting: Cardiology

## 2023-02-27 NOTE — Telephone Encounter (Signed)
Rx request sent to pharmacy.  

## 2023-02-28 ENCOUNTER — Encounter: Payer: Self-pay | Admitting: "Endocrinology

## 2023-02-28 ENCOUNTER — Ambulatory Visit: Payer: PPO | Admitting: "Endocrinology

## 2023-02-28 VITALS — BP 180/100 | HR 74 | Ht 64.0 in | Wt 134.2 lb

## 2023-02-28 DIAGNOSIS — E269 Hyperaldosteronism, unspecified: Secondary | ICD-10-CM | POA: Diagnosis not present

## 2023-02-28 DIAGNOSIS — I10 Essential (primary) hypertension: Secondary | ICD-10-CM

## 2023-02-28 MED ORDER — METOPROLOL TARTRATE 100 MG PO TABS: 50.0000 mg | ORAL_TABLET | Freq: Two times a day (BID) | ORAL | 2 refills | Status: DC

## 2023-02-28 NOTE — Progress Notes (Signed)
Outpatient Endocrinology Note [place], MD    Anne [name] [birth year] [id number]  Referring Provider: Delma Officer, PA Primary Care Provider: Delma Officer, PA Reason for consultation: Subjective   Assessment & Plan  Anne [name] was seen today for hyperaldosteronism.  Diagnoses and all orders for this visit:  Uncontrolled hypertension -     Cancel: Aldosterone + renin activity w/ ratio; Future -     Cancel: Basic metabolic panel; Future  Hyperaldosteronism (HCC)  Other orders -     metoprolol tartrate (LOPRESSOR) 100 MG tablet; Take 0.5 tablets (50 mg total) by mouth 2 (two) times daily.    Patient here to rule out hyperaldosteronism Has been on spironolactone 25 mg for past 2 years, last took 6-8 weeks ago Recent baseline blood work done showed aldosterone of 2, potassium of 4, no renin Patient checks BP at home everyday 1-2 times and reports BP well controlled in SBP 140-150, and DBP in 80-90s. Ordered labs to r/o primary hyperaldosteronism   BP 160/100-> 180/100 on recheck, pulse  74 (pt self took her nitroglycerin tablet) ->180/100 on recheck  Tolerating metoprolol 50 mg bid well (history of disorientation with nebivolol, but pt without it is also experiencing some disorientation and clear dizziness with high BP) Pt recommended to go to urgent care but pt reports bad experience with urgent care/ER in past Recommend metoprolol 100 mg bid, resume spironolactone 25 mg every day (history of disorientation with nebivolol, but pt without it is also experiencing some disorientation and clear dizziness with high BP) Pt reports poor tolerance to BP meds in past Reported in allergy list: amiloride, amlodipine, cardizem, diltiazem, eplerenone, hydralazine and lisinopril. Self stopped spironolactone too Instructed to go to urgent care now/if cannot tolerate metoprolol or BP remains >160/90 Repeat labs in 4 weeks (off of spironolacone for 7 wks)  Return in  about 12 days (around 03/12/2023).   I have reviewed current medications, nurse's notes, allergies, vital signs, past medical and surgical history, family medical history, and social history for this encounter. Counseled patient on symptoms, examination findings, lab findings, imaging results, treatment decisions and monitoring and prognosis. The patient understood the recommendations and agrees with the treatment plan. All questions regarding treatment plan were fully answered.  [place], MD  02/28/23   History of Present Illness HPI  Anne [name] is a 77 y.o. female  referred by Dr. Marlon Pel for evaluation and management of "other hyperaldosteronism".   No history of adrenal tumors.  Patient checks BP at home everyday 1-2 times and reports BP well controlled in SBP 140-150, and DBP in 80-90s. Tolerating metoprolol 50 mg bid well   Pt is concerned about her BP control. She last took on spironolactone 25 mg qd around 7 or more weeks ago. She had been on it 2 years.  Previously,  She  Denies abdominal pain  Weight loss happened when pt went on restrictive diet   a history of HTN: yes   a history of hypokalemia: No  paroxysmal episodes of anxiety: no  Tremors: no  Lightheadedness: only if BP is high   Headache: no  Palpitation: no  Sweating: no  blurry vision: no  Pt has tried BP medications in past and has poor tolerance to it    Physical Exam  BP (!) 180/100 (BP Location: Left Arm)   Pulse 74   Ht 5\' 4"  (1.626 m)   Wt 134 lb 3.2 oz (60.9 kg)   SpO2 95%  BMI 23.04 kg/m    Constitutional: well developed, well nourished Head: normocephalic, atraumatic Eyes: sclera anicteric, no redness Neck: supple Lungs: normal respiratory effort Neurology: alert and oriented Skin: dry, no appreciable rashes Musculoskeletal: no appreciable defects Psychiatric: normal mood and affect   Current Medications Patient's Medications  New Prescriptions   No medications on  file  Previous Medications   ALENDRONATE (FOSAMAX) 70 MG TABLET    Take by mouth.   AMLODIPINE (NORVASC) 2.5 MG TABLET    Take 2.5 mg by mouth daily.   ASPIRIN EC 81 MG TABLET    TAKE 1 TABLET (81 MG TOTAL) BY MOUTH DAILY. SWALLOW WHOLE.   MECLIZINE (ANTIVERT) 25 MG TABLET    Take 1 tablet three times daily (every 8 hours) for 3 days and then take as needed for dizziness   NITROGLYCERIN (NITROSTAT) 0.4 MG SL TABLET    Place 1 tablet (0.4 mg total) under the tongue every 5 (five) minutes as needed for chest pain. May repeat 2 times, if 3rd dose needed call 911   SPIRONOLACTONE (ALDACTONE) 25 MG TABLET    TAKE 1 TABLET BY MOUTH TWICE A DAY   VITAMIN D, ERGOCALCIFEROL, (DRISDOL) 1.25 MG (50000 UNIT) CAPS CAPSULE    Take 50,000 Units by mouth every 7 (seven) days.  Modified Medications   Modified Medication Previous Medication   METOPROLOL TARTRATE (LOPRESSOR) 100 MG TABLET metoprolol tartrate (LOPRESSOR) 50 MG tablet      Take 0.5 tablets (50 mg total) by mouth 2 (two) times daily.    TAKE 1 TABLET BY MOUTH TWICE A DAY  Discontinued Medications   No medications on file    Allergies Allergies  Allergen Reactions   Amiloride Other (See Comments)    Hair loss   Anesthetics, Amide Other (See Comments)    Lethargic and very slow reanimation lasting a full day or so   Other Other (See Comments)    Very hard to awake from anaesthetic with surgery. Acute renal failure, hypokalemia Very hard to awake from anaesthetic with surgery.   Prilocaine Other (See Comments)    Lethargic and very slow reanimation lasting a full day or so   Amlodipine Besylate Swelling    Severe edema / Headaches   Augmentin [Amoxicillin-Pot Clavulanate] Other (See Comments)    Pain in hips and legs   Cardizem [Diltiazem Hcl] Nausea And Vomiting    Hospitalized when it occurred   Cefdinir Nausea And Vomiting   Diltiazem Hcl Nausea And Vomiting    Hospitalized when it occurred    Eplerenone Other (See Comments)     Hair loss   Indomethacin Other (See Comments)    esophagitis   Naproxen Other (See Comments)    Gastric intolerance   Tenex [Guanfacine Hcl] Other (See Comments)    Chronic insomnia    Triamterene-Hctz Other (See Comments)    Acute renal failure, hypokalemia    Cephalexin Other (See Comments)   Hydralazine Hcl Other (See Comments)    Fluid retention   Nebivolol Hcl     Other reaction(s): disoriented   Potassium Chloride Other (See Comments)    Severe hair loss, baldness   Hytrin [Terazosin Hcl] Other (See Comments)    Pt states she can not take this med but can not relate any side effect or incident that occurred   Lisinopril Other (See Comments)    Unknown, pt says she "can't take it", cough/hypotension and thirst    Past Medical History Past Medical History:  Diagnosis Date  Complication of anesthesia    slow to wake up   Diverticulosis    Hiatal hernia    Hypercholesterolemia    Hypertension    Osteopenia    Type 2 diabetes mellitus with stage 3a chronic kidney disease, without long-term current use of insulin (HCC) 12/05/2021   Vitamin D deficiency     Past Surgical History Past Surgical History:  Procedure Laterality Date   ABDOMINAL HYSTERECTOMY     Partial   AUGMENTATION MAMMAPLASTY     BREAST BIOPSY Left 1975   BREAST BIOPSY Right 1975   CORONARY STENT INTERVENTION N/A 03/16/2020   Procedure: CORONARY STENT INTERVENTION;  Surgeon: Kathleene Hazel, MD;  Location: [facility];  Service: Cardiovascular;  Laterality: N/A;   fibrocystic breast excision     LAPAROSCOPIC ILEOCECECTOMY  07/07/2015   LAPAROTOMY N/A 07/07/2015   Procedure: EXPLORATORY LAPAROTOMY WITH ILEOCECECTOMY ;  Surgeon: Manus Rudd, MD;  Location: [facility];  Service: General;  Laterality: N/A;   LEFT HEART CATH AND CORONARY ANGIOGRAPHY N/A 03/16/2020   Procedure: LEFT HEART CATH AND CORONARY ANGIOGRAPHY;  Surgeon: Kathleene Hazel, MD;  Location: [facility];   Service: Cardiovascular;  Laterality: N/A;   TUBAL LIGATION      Family History family history includes Hypertension in her mother; Stroke in her brother and mother.  Social History Social History   Socioeconomic History   Marital status: Married    Spouse name: Not on file   Number of children: 2   Years of education: Not on file   Highest education level: Not on file  Occupational History   Occupation: retired  Tobacco Use   Smoking status: Never   Smokeless tobacco: Never  Vaping Use   Vaping Use: Never used  Substance and Sexual Activity   Alcohol use: Yes    Comment: 3 times a year   Drug use: No   Sexual activity: Yes    Birth control/protection: Surgical  Other Topics Concern   Not on file  Social History Narrative   Not on file   Social Determinants of Health   Financial Resource Strain: Not on file  Food Insecurity: Not on file  Transportation Needs: Not on file  Physical Activity: Not on file  Stress: Not on file  Social Connections: Not on file  Intimate Partner Violence: Not on file   Component     Latest Ref Rng 01/14/2023  Calcium     8.4 - 10.5 mg/dL 9.3   Metanephrine, Pl     <=57 pg/mL 25   Normetanephrine, Pl     <=148 pg/mL 200 (H)   Total Metanephrines-Plasma     <=205 pg/mL 225 (H)   DHEA-SO4     4 - 157 mcg/dL 26   ALDOSTERONE     - ng/dL 8   Cortisol, Plasma     ug/dL 7.6   Z610 ACTH     6 - 50 pg/mL 11     Legend: (H) High  Lab Results  Component Value Date   CHOL 164 12/05/2021   Lab Results  Component Value Date   HDL 60 12/05/2021   Lab Results  Component Value Date   LDLCALC 98 12/05/2021   Lab Results  Component Value Date   TRIG 31 12/05/2021   Lab Results  Component Value Date   CHOLHDL 2.7 12/05/2021   Lab Results  Component Value Date   CREATININE 0.93 02/25/2023   Lab Results  Component Value Date  GFR 60.31 02/25/2023      Component Value Date/Time   NA 139 02/25/2023 0836   NA 140  03/08/2021 0926   K 3.5 02/25/2023 0836   CL 103 02/25/2023 0836   CO2 28 02/25/2023 0836   GLUCOSE 93 02/25/2023 0836   BUN 10 02/25/2023 0836   BUN 12 03/08/2021 0926   CREATININE 0.93 02/25/2023 0836   CALCIUM 9.1 02/25/2023 0836   PROT 6.1 (L) 12/07/2021 0531   ALBUMIN 3.2 (L) 12/07/2021 0531   AST 19 12/07/2021 0531   ALT 12 12/07/2021 0531   ALKPHOS 46 12/07/2021 0531   BILITOT 1.1 12/07/2021 0531   GFRNONAA 55 (L) 12/07/2021 0531   GFRAA >60 04/18/2020 1427      Latest Ref Rng & Units 02/25/2023    8:36 AM 01/14/2023   11:40 AM 12/07/2021    5:31 AM  BMP  Glucose 70 - 99 mg/dL 93  85  85   BUN 6 - 23 mg/dL 10  9  11    Creatinine 0.40 - 1.20 mg/dL 7.84  6.96  2.95   Sodium 135 - 145 mEq/L 139  139  137   Potassium 3.5 - 5.1 mEq/L 3.5  4.0  3.7   Chloride 96 - 112 mEq/L 103  102  105   CO2 19 - 32 mEq/L 28  30  27    Calcium 8.4 - 10.5 mg/dL 9.1  9.3  8.9        Component Value Date/Time   WBC 3.0 (L) 12/07/2021 0531   RBC 3.89 12/07/2021 0531   HGB 11.5 (L) 12/07/2021 0531   HGB 11.8 03/01/2021 1629   HGB 10.0 (L) 03/16/2010 1520   HCT 32.6 (L) 12/07/2021 0531   HCT 35.8 03/01/2021 1629   HCT 29.1 (L) 03/16/2010 1520   PLT 183 12/07/2021 0531   PLT 203 03/01/2021 1629   MCV 83.8 12/07/2021 0531   MCV 88 03/01/2021 1629   MCV 79.5 03/16/2010 1520   MCH 29.6 12/07/2021 0531   MCHC 35.3 12/07/2021 0531   RDW 13.2 12/07/2021 0531   RDW 14.0 03/01/2021 1629   RDW 14.9 (H) 03/16/2010 1520   LYMPHSABS 1.4 12/05/2021 0250   LYMPHSABS 1.5 03/16/2010 1520   MONOABS 0.3 12/05/2021 0250   MONOABS 0.3 03/16/2010 1520   EOSABS 0.1 12/05/2021 0250   EOSABS 0.1 03/16/2010 1520   BASOSABS 0.0 12/05/2021 0250   BASOSABS 0.0 03/16/2010 1520   Lab Results  Component Value Date   TSH 1.960 03/01/2021   TSH 2.747 12/21/2019   TSH 1.43 09/30/2013         Parts of this note may have been dictated using voice recognition software. There may be variances in spelling and  vocabulary which are unintentional. Not all errors are proofread. Please notify the [facility] Parkin if any discrepancies are noted or if the meaning of any statement is not clear.

## 2023-03-03 LAB — RENIN: Renin Activity: 0.57 ng/mL/h (ref 0.25–5.82)

## 2023-03-03 LAB — ALDOSTERONE: Aldosterone: 10 ng/dL

## 2023-03-10 ENCOUNTER — Other Ambulatory Visit: Payer: PPO

## 2023-03-10 ENCOUNTER — Ambulatory Visit: Payer: PPO | Admitting: "Endocrinology

## 2023-03-10 ENCOUNTER — Other Ambulatory Visit (HOSPITAL_BASED_OUTPATIENT_CLINIC_OR_DEPARTMENT_OTHER): Payer: Self-pay | Admitting: Cardiology

## 2023-03-10 ENCOUNTER — Encounter: Payer: Self-pay | Admitting: "Endocrinology

## 2023-03-10 VITALS — BP 200/100 | HR 74 | Ht 64.0 in | Wt 134.2 lb

## 2023-03-10 DIAGNOSIS — I10 Essential (primary) hypertension: Secondary | ICD-10-CM

## 2023-03-10 MED ORDER — METOPROLOL TARTRATE 100 MG PO TABS: 100.0000 mg | ORAL_TABLET | Freq: Two times a day (BID) | ORAL | 2 refills | Status: DC

## 2023-03-10 NOTE — Patient Instructions (Signed)
Recommend metoprolol 100 mg bid and spirolactone starting at 25 mg every day

## 2023-03-10 NOTE — Progress Notes (Signed)
Outpatient Endocrinology Note [place], MD    Anne [name] May 03, 1946 [id number]  Referring Provider: Delma Officer, PA Primary Care Provider: Delma Officer, [place] Reason for consultation: Subjective   Assessment & Plan  Diagnoses and all orders for this visit:  Uncontrolled hypertension  Other orders -     metoprolol tartrate (LOPRESSOR) 100 MG tablet; Take 1 tablet (100 mg total) by mouth 2 (two) times daily.    Patient here to rule out hyperaldosteronism Has been on spironolactone 25 mg for past 2 years, last took 8 weeks ago Recent baseline blood work done showed aldosterone of 2, potassium of 4, no renin Ordered labs to r/o primary hyperaldosteronism: aldosterone came back at 10, renin 0.57->cannot rule out primary hyperaldosteronism without salt loading, however given hypertensive urgency cannot do salt loading. Patient understands an agreed Recommend to go to ER/urgent care and start metoprolol 100 mg bid and spirolactone starting at 25 mg every day, patient was given same recommendation previous visit but is not following recommendations Explained the risks of high BP at length, patient understand the rusk of MI, stroke and other organ failure  Given prescription for metoprolol, patient already has spironolactone prescription on file  Plans plans to follow up with her PCP  Asked patient to call us if she wants to follow up with [place]    BP 200/100 mg every day right now Tolerating metoprolol 50 mg bid well (history of disorientation with nebivolol, but pt without it is also experiencing some disorientation and clear dizziness with high BP) Pt recommended to go to urgent care but pt reports bad experience with urgent care/ER in past Recommend metoprolol 100 mg bid, resume spironolactone 25 mg every day (history of disorientation with nebivolol, but pt without it is also experiencing some disorientation and clear dizziness with high BP) Pt reports poor  tolerance to BP meds in past Reported in allergy list: amiloride, amlodipine, cardizem, diltiazem, eplerenone, hydralazine and lisinopril. Self stopped spironolactone too   No follow-ups on file. Asked patient to call us if she wants to follow up with [place]   I have reviewed current medications, nurse's notes, allergies, vital signs, past medical and surgical history, family medical history, and social history for this encounter. Counseled patient on symptoms, examination findings, lab findings, imaging results, treatment decisions and monitoring and prognosis. The patient understood the recommendations and agrees with the treatment plan. All questions regarding treatment plan were fully answered.  Anne [place], MD  03/10/23   History of Present Illness HPI  Anne [name] is a 77 y.o. female  referred by Dr. Marlon Pel for evaluation and management of "other hyperaldosteronism".   No history of adrenal tumors.  Patient checks BP at home everyday 1-2 times and reports BP well controlled in SBP 140-150, and DBP in 80-90s. Tolerating metoprolol 50 mg bid well   Not taking metoprolol 100 mg bid and spirolactone starting at 25 mg every day as recommend in last visit   Pt is concerned about her BP control. She last took on spironolactone 25 mg qd around 8 or more weeks ago. She had been on it 2 years.  Currently reports head ache and blurry vision   Previously,   Weight loss happened when pt went on restrictive diet   Spironolactone lead to hyperkalemia   a history of HTN: yes   a history of hypokalemia: No  paroxysmal episodes of anxiety: no  Tremors: no  Lightheadedness: only if BP is high  Headache: no  Palpitation: no  Sweating: no  blurry vision: yes  Pt has tried BP medications in past and has poor tolerance to it    Physical Exam  BP (!) 200/100   Pulse 74   Ht 5\' 4"  (1.626 m)   Wt 134 lb 3.2 oz (60.9 kg)   SpO2 94%   BMI 23.04 kg/m    Constitutional: well  developed, well nourished Head: normocephalic, atraumatic Eyes: sclera anicteric, no redness Neck: supple Lungs: normal respiratory effort Neurology: alert and oriented Skin: dry, no appreciable rashes Musculoskeletal: no appreciable defects Psychiatric: normal mood and affect   Current Medications Patient's Medications  New Prescriptions   No medications on file  Previous Medications   ALENDRONATE (FOSAMAX) 70 MG TABLET    Take by mouth.   AMLODIPINE (NORVASC) 2.5 MG TABLET    Take 2.5 mg by mouth daily.   ASPIRIN EC 81 MG TABLET    TAKE 1 TABLET (81 MG TOTAL) BY MOUTH DAILY. SWALLOW WHOLE.   MECLIZINE (ANTIVERT) 25 MG TABLET    Take 1 tablet three times daily (every 8 hours) for 3 days and then take as needed for dizziness   NITROGLYCERIN (NITROSTAT) 0.4 MG SL TABLET    Place 1 tablet (0.4 mg total) under the tongue every 5 (five) minutes as needed for chest pain. May repeat 2 times, if 3rd dose needed call 911   SPIRONOLACTONE (ALDACTONE) 25 MG TABLET    TAKE 1 TABLET BY MOUTH TWICE A DAY   VITAMIN D, ERGOCALCIFEROL, (DRISDOL) 1.25 MG (50000 UNIT) CAPS CAPSULE    Take 50,000 Units by mouth every 7 (seven) days.  Modified Medications   Modified Medication Previous Medication   METOPROLOL TARTRATE (LOPRESSOR) 100 MG TABLET metoprolol tartrate (LOPRESSOR) 100 MG tablet      Take 1 tablet (100 mg total) by mouth 2 (two) times daily.    Take 0.5 tablets (50 mg total) by mouth 2 (two) times daily.  Discontinued Medications   No medications on file    Allergies Allergies  Allergen Reactions   Amiloride Other (See Comments)    Hair loss   Anesthetics, Amide Other (See Comments)    Lethargic and very slow reanimation lasting a full day or so   Other Other (See Comments)    Very hard to awake from anaesthetic with surgery. Acute renal failure, hypokalemia Very hard to awake from anaesthetic with surgery.   Prilocaine Other (See Comments)    Lethargic and very slow reanimation  lasting a full day or so   Amlodipine Besylate Swelling    Severe edema / Headaches   Augmentin [Amoxicillin-Pot Clavulanate] Other (See Comments)    Pain in hips and legs   Cardizem [Diltiazem Hcl] Nausea And Vomiting    Hospitalized when it occurred   Cefdinir Nausea And Vomiting   Diltiazem Hcl Nausea And Vomiting    Hospitalized when it occurred    Eplerenone Other (See Comments)    Hair loss   Indomethacin Other (See Comments)    esophagitis   Naproxen Other (See Comments)    Gastric intolerance   Tenex [Guanfacine Hcl] Other (See Comments)    Chronic insomnia    Triamterene-Hctz Other (See Comments)    Acute renal failure, hypokalemia    Cephalexin Other (See Comments)   Hydralazine Hcl Other (See Comments)    Fluid retention   Nebivolol Hcl     Other reaction(s): disoriented   Potassium Chloride Other (See Comments)  Severe hair loss, baldness   Hytrin [Terazosin Hcl] Other (See Comments)    Pt states she can not take this med but can not relate any side effect or incident that occurred   Lisinopril Other (See Comments)    Unknown, pt says she "can't take it", cough/hypotension and thirst    Past Medical History Past Medical History:  Diagnosis Date   Complication of anesthesia    slow to wake up   Diverticulosis    Hiatal hernia    Hypercholesterolemia    Hypertension    Osteopenia    Type 2 diabetes mellitus with stage 3a chronic kidney disease, without long-term current use of insulin (HCC) 12/05/2021   Vitamin D deficiency     Past Surgical History Past Surgical History:  Procedure Laterality Date   ABDOMINAL HYSTERECTOMY     Partial   AUGMENTATION MAMMAPLASTY     BREAST BIOPSY Left 1975   BREAST BIOPSY Right 1975   CORONARY STENT INTERVENTION N/A 03/16/2020   Procedure: CORONARY STENT INTERVENTION;  Surgeon: Kathleene Hazel, MD;  Location: [facility];  Service: Cardiovascular;  Laterality: N/A;   fibrocystic breast excision      LAPAROSCOPIC ILEOCECECTOMY  07/07/2015   LAPAROTOMY N/A 07/07/2015   Procedure: EXPLORATORY LAPAROTOMY WITH ILEOCECECTOMY ;  Surgeon: Manus Rudd, MD;  Location: [facility];  Service: General;  Laterality: N/A;   LEFT HEART CATH AND CORONARY ANGIOGRAPHY N/A 03/16/2020   Procedure: LEFT HEART CATH AND CORONARY ANGIOGRAPHY;  Surgeon: Kathleene Hazel, MD;  Location: [facility];  Service: Cardiovascular;  Laterality: N/A;   TUBAL LIGATION      Family History family history includes Hypertension in her mother; Stroke in her brother and mother.  Social History Social History   Socioeconomic History   Marital status: Married    Spouse name: Not on file   Number of children: 2   Years of education: Not on file   Highest education level: Not on file  Occupational History   Occupation: retired  Tobacco Use   Smoking status: Never   Smokeless tobacco: Never  Vaping Use   Vaping Use: Never used  Substance and Sexual Activity   Alcohol use: Yes    Comment: 3 times a year   Drug use: No   Sexual activity: Yes    Birth control/protection: Surgical  Other Topics Concern   Not on file  Social History Narrative   Not on file   Social Determinants of Health   Financial Resource Strain: Not on file  Food Insecurity: Not on file  Transportation Needs: Not on file  Physical Activity: Not on file  Stress: Not on file  Social Connections: Not on file  Intimate Partner Violence: Not on file   Component     Latest Ref Rng 01/14/2023  Calcium     8.4 - 10.5 mg/dL 9.3   Metanephrine, Pl     <=57 pg/mL 25   Normetanephrine, Pl     <=148 pg/mL 200 (H)   Total Metanephrines-Plasma     <=205 pg/mL 225 (H)   DHEA-SO4     4 - 157 mcg/dL 26   ALDOSTERONE     - ng/dL 8   Cortisol, Plasma     ug/dL 7.6   M841 ACTH     6 - 50 pg/mL 11     Legend: (H) High  Lab Results  Component Value Date   CHOL 164 12/05/2021   Lab Results  Component  Value Date   HDL 60 12/05/2021    Lab Results  Component Value Date   LDLCALC 98 12/05/2021   Lab Results  Component Value Date   TRIG 31 12/05/2021   Lab Results  Component Value Date   CHOLHDL 2.7 12/05/2021   Lab Results  Component Value Date   CREATININE 0.93 02/25/2023   Lab Results  Component Value Date   GFR 60.31 02/25/2023      Component Value Date/Time   NA 139 02/25/2023 0836   NA 140 03/08/2021 0926   K 3.5 02/25/2023 0836   CL 103 02/25/2023 0836   CO2 28 02/25/2023 0836   GLUCOSE 93 02/25/2023 0836   BUN 10 02/25/2023 0836   BUN 12 03/08/2021 0926   CREATININE 0.93 02/25/2023 0836   CALCIUM 9.1 02/25/2023 0836   PROT 6.1 (L) 12/07/2021 0531   ALBUMIN 3.2 (L) 12/07/2021 0531   AST 19 12/07/2021 0531   ALT 12 12/07/2021 0531   ALKPHOS 46 12/07/2021 0531   BILITOT 1.1 12/07/2021 0531   GFRNONAA 55 (L) 12/07/2021 0531   GFRAA >60 04/18/2020 1427      Latest Ref Rng & Units 02/25/2023    8:36 AM 01/14/2023   11:40 AM 12/07/2021    5:31 AM  BMP  Glucose 70 - 99 mg/dL 93  85  85   BUN 6 - 23 mg/dL 10  9  11    Creatinine 0.40 - 1.20 mg/dL 0.98  1.19  1.47   Sodium 135 - 145 mEq/L 139  139  137   Potassium 3.5 - 5.1 mEq/L 3.5  4.0  3.7   Chloride 96 - 112 mEq/L 103  102  105   CO2 19 - 32 mEq/L 28  30  27    Calcium 8.4 - 10.5 mg/dL 9.1  9.3  8.9        Component Value Date/Time   WBC 3.0 (L) 12/07/2021 0531   RBC 3.89 12/07/2021 0531   HGB 11.5 (L) 12/07/2021 0531   HGB 11.8 03/01/2021 1629   HGB 10.0 (L) 03/16/2010 1520   HCT 32.6 (L) 12/07/2021 0531   HCT 35.8 03/01/2021 1629   HCT 29.1 (L) 03/16/2010 1520   PLT 183 12/07/2021 0531   PLT 203 03/01/2021 1629   MCV 83.8 12/07/2021 0531   MCV 88 03/01/2021 1629   MCV 79.5 03/16/2010 1520   MCH 29.6 12/07/2021 0531   MCHC 35.3 12/07/2021 0531   RDW 13.2 12/07/2021 0531   RDW 14.0 03/01/2021 1629   RDW 14.9 (H) 03/16/2010 1520   LYMPHSABS 1.4 12/05/2021 0250   LYMPHSABS 1.5 03/16/2010 1520   MONOABS 0.3 12/05/2021 0250    MONOABS 0.3 03/16/2010 1520   EOSABS 0.1 12/05/2021 0250   EOSABS 0.1 03/16/2010 1520   BASOSABS 0.0 12/05/2021 0250   BASOSABS 0.0 03/16/2010 1520   Lab Results  Component Value Date   TSH 1.960 03/01/2021   TSH 2.747 12/21/2019   TSH 1.43 09/30/2013         Parts of this note may have been dictated using voice recognition software. There may be variances in spelling and vocabulary which are unintentional. Not all errors are proofread. Please notify the [facility] Park[facility] if any discrepancies are noted or if the meaning of any statement is not clear.

## 2023-03-10 NOTE — Telephone Encounter (Signed)
Rx request sent to pharmacy.  

## 2023-03-17 ENCOUNTER — Ambulatory Visit: Payer: PPO | Admitting: "Endocrinology

## 2023-03-18 DIAGNOSIS — I10 Essential (primary) hypertension: Secondary | ICD-10-CM | POA: Diagnosis not present

## 2023-03-25 DIAGNOSIS — Z713 Dietary counseling and surveillance: Secondary | ICD-10-CM | POA: Diagnosis not present

## 2023-05-19 DIAGNOSIS — M810 Age-related osteoporosis without current pathological fracture: Secondary | ICD-10-CM | POA: Diagnosis not present

## 2023-05-19 DIAGNOSIS — I700 Atherosclerosis of aorta: Secondary | ICD-10-CM | POA: Diagnosis not present

## 2023-05-19 DIAGNOSIS — G720 Drug-induced myopathy: Secondary | ICD-10-CM | POA: Diagnosis not present

## 2023-05-19 DIAGNOSIS — I6501 Occlusion and stenosis of right vertebral artery: Secondary | ICD-10-CM | POA: Diagnosis not present

## 2023-05-19 DIAGNOSIS — I252 Old myocardial infarction: Secondary | ICD-10-CM | POA: Diagnosis not present

## 2023-05-19 DIAGNOSIS — H8113 Benign paroxysmal vertigo, bilateral: Secondary | ICD-10-CM | POA: Diagnosis not present

## 2023-05-19 DIAGNOSIS — E2749 Other adrenocortical insufficiency: Secondary | ICD-10-CM | POA: Diagnosis not present

## 2023-05-19 DIAGNOSIS — Z0000 Encounter for general adult medical examination without abnormal findings: Secondary | ICD-10-CM | POA: Diagnosis not present

## 2023-05-19 DIAGNOSIS — E7800 Pure hypercholesterolemia, unspecified: Secondary | ICD-10-CM | POA: Diagnosis not present

## 2023-05-19 DIAGNOSIS — I10 Essential (primary) hypertension: Secondary | ICD-10-CM | POA: Diagnosis not present

## 2023-05-22 ENCOUNTER — Other Ambulatory Visit: Payer: Self-pay | Admitting: "Endocrinology

## 2023-05-27 ENCOUNTER — Other Ambulatory Visit: Payer: Self-pay | Admitting: Cardiology

## 2023-05-27 DIAGNOSIS — I10 Essential (primary) hypertension: Secondary | ICD-10-CM | POA: Diagnosis not present

## 2023-05-27 DIAGNOSIS — Z713 Dietary counseling and surveillance: Secondary | ICD-10-CM | POA: Diagnosis not present

## 2023-05-27 DIAGNOSIS — E7800 Pure hypercholesterolemia, unspecified: Secondary | ICD-10-CM | POA: Diagnosis not present

## 2023-05-27 MED ORDER — SPIRONOLACTONE 25 MG PO TABS: 25.0000 mg | ORAL_TABLET | Freq: Two times a day (BID) | ORAL | 0 refills | Status: DC

## 2023-05-27 NOTE — Addendum Note (Signed)
Addended by: Barrie Dunker on: 05/27/2023 10:41 AM   Modules accepted: Orders

## 2023-05-28 ENCOUNTER — Emergency Department (HOSPITAL_COMMUNITY)
Admission: EM | Admit: 2023-05-28 | Discharge: 2023-05-29 | Disposition: A | Discharge status: Home / Self Care | Payer: PPO | Attending: Emergency Medicine | Admitting: Emergency Medicine

## 2023-05-28 ENCOUNTER — Encounter (HOSPITAL_COMMUNITY): Payer: Self-pay | Admitting: Emergency Medicine

## 2023-05-28 ENCOUNTER — Other Ambulatory Visit: Payer: Self-pay

## 2023-05-28 DIAGNOSIS — I110 Hypertensive heart disease with heart failure: Secondary | ICD-10-CM | POA: Insufficient documentation

## 2023-05-28 DIAGNOSIS — D649 Anemia, unspecified: Secondary | ICD-10-CM | POA: Diagnosis not present

## 2023-05-28 DIAGNOSIS — I5030 Unspecified diastolic (congestive) heart failure: Secondary | ICD-10-CM | POA: Diagnosis not present

## 2023-05-28 DIAGNOSIS — Z7982 Long term (current) use of aspirin: Secondary | ICD-10-CM | POA: Diagnosis not present

## 2023-05-28 DIAGNOSIS — Z79899 Other long term (current) drug therapy: Secondary | ICD-10-CM | POA: Diagnosis not present

## 2023-05-28 DIAGNOSIS — M25552 Pain in left hip: Secondary | ICD-10-CM | POA: Diagnosis not present

## 2023-05-28 DIAGNOSIS — M7989 Other specified soft tissue disorders: Secondary | ICD-10-CM | POA: Diagnosis not present

## 2023-05-28 DIAGNOSIS — I2510 Atherosclerotic heart disease of native coronary artery without angina pectoris: Secondary | ICD-10-CM | POA: Insufficient documentation

## 2023-05-28 DIAGNOSIS — I10 Essential (primary) hypertension: Secondary | ICD-10-CM

## 2023-05-28 DIAGNOSIS — M79605 Pain in left leg: Secondary | ICD-10-CM | POA: Diagnosis not present

## 2023-05-28 LAB — BASIC METABOLIC PANEL WITH GFR
Anion gap: 9 (ref 5–15)
BUN: 15 mg/dL (ref 8–23)
CO2: 23 mmol/L (ref 22–32)
Calcium: 8.9 mg/dL (ref 8.9–10.3)
Chloride: 103 mmol/L (ref 98–111)
Creatinine, Ser: 1.04 mg/dL — ABNORMAL HIGH (ref 0.44–1.00)
GFR, Estimated: 56 mL/min — ABNORMAL LOW (ref >60)
Glucose, Bld: 95 mg/dL (ref 70–99)
Potassium: 4 mmol/L (ref 3.5–5.1)
Sodium: 135 mmol/L (ref 135–145)

## 2023-05-28 LAB — CBC WITH DIFFERENTIAL/PLATELET
Abs Immature Granulocytes: 0.01 10*3/uL (ref 0.00–0.07)
Basophils Absolute: 0 10*3/uL (ref 0.0–0.1)
Basophils Relative: 0 %
Eosinophils Absolute: 0 10*3/uL (ref 0.0–0.5)
Eosinophils Relative: 1 %
HCT: 32.7 % — ABNORMAL LOW (ref 36.0–46.0)
Hemoglobin: 11.4 g/dL — ABNORMAL LOW (ref 12.0–15.0)
Immature Granulocytes: 0 %
Lymphocytes Relative: 29 %
Lymphs Abs: 1.3 10*3/uL (ref 0.7–4.0)
MCH: 29.8 pg (ref 26.0–34.0)
MCHC: 34.9 g/dL (ref 30.0–36.0)
MCV: 85.4 fL (ref 80.0–100.0)
Monocytes Absolute: 0.4 10*3/uL (ref 0.1–1.0)
Monocytes Relative: 8 %
Neutro Abs: 2.8 10*3/uL (ref 1.7–7.7)
Neutrophils Relative %: 62 %
Platelets: 186 10*3/uL (ref 150–400)
RBC: 3.83 MIL/uL — ABNORMAL LOW (ref 3.87–5.11)
RDW: 14.2 % (ref 11.5–15.5)
WBC: 4.6 10*3/uL (ref 4.0–10.5)
nRBC: 0 % (ref 0.0–0.2)

## 2023-05-28 LAB — MAGNESIUM: Magnesium: 2.2 mg/dL (ref 1.7–2.4)

## 2023-05-28 NOTE — ED Provider Triage Note (Cosign Needed)
Emergency Medicine Provider Triage Evaluation Note  Anne [name] , a 77 y.o. female  was evaluated in triage.  Pt complains of bilateral lower extremity cramping.  This began yesterday morning and has progressively gotten worse.  Patient takes spironolactone.  Denies any trauma.  Also reports left-sided low back pain with radiation down the posterior of the left leg into the foot.  Denies history of trauma.  Review of Systems  Positive: As above Negative: As above  Physical Exam  BP (!) 161/86   Pulse 91   Temp 98.9 F (37.2 C) (Oral)   Resp 18   Ht 5\' 4"  (1.626 m)   Wt 60.8 kg   SpO2 100%   BMI 23.00 kg/m  Gen:   Awake, no distress   Resp:  Normal effort  MSK:   Moves extremities without difficulty  Other:  No lower extremity swelling  Medical Decision Making  Medically screening exam initiated at 3:26 PM.  Appropriate orders placed.  Anne [name] was informed that the remainder of the evaluation will be completed by another provider, this initial triage assessment does not replace that evaluation, and the importance of remaining in the ED until their evaluation is complete.  Workup initiated   Anne [name], Anne [name] 05/28/23 1527

## 2023-05-28 NOTE — ED Triage Notes (Signed)
Patient arrives by wheelchair by POV c/o left leg pain and swelling and bilateral leg cramping since yesterday. Pain worse when ambulating.

## 2023-05-29 ENCOUNTER — Emergency Department (HOSPITAL_COMMUNITY): Payer: PPO

## 2023-05-29 DIAGNOSIS — M7989 Other specified soft tissue disorders: Secondary | ICD-10-CM | POA: Diagnosis not present

## 2023-05-29 DIAGNOSIS — M79605 Pain in left leg: Secondary | ICD-10-CM | POA: Diagnosis not present

## 2023-05-29 MED ORDER — CELECOXIB 100 MG PO CAPS: 100.0000 mg | ORAL_CAPSULE | Freq: Every day | ORAL | 0 refills | Status: DC

## 2023-05-29 NOTE — Discharge Instructions (Addendum)
Your x-ray shows some mild arthritis.  I am concerned that your hip pain is from arthritis although it could be from bursitis.  Please see the orthopedic doctor as soon as possible.  You may benefit from a cortisone injection.  Apply ice for 30 minutes at a time, 4 times a day.  You may take acetaminophen as needed for pain.  If you have any stomach or esophageal distress, stop taking celecoxib.  If you are tolerating it well, your physician may increase it to twice a day.  You may use a crutch or cane as needed.  However, you may actually do better with a walker.  Please make sure to check the x-ray report on MyChart.

## 2023-05-29 NOTE — ED Provider Notes (Signed)
[place] EMERGENCY DEPARTMENT AT [facility] Provider Note   CSN: [id number] Arrival date & time: 05/28/23  1516     History  Chief Complaint  Patient presents with   Leg Pain    Anne [name] is a 77 y.o. female.  The history is provided by the patient.  Leg Pain She has history of hypertension, hyperlipidemia, primary hyperaldosteronism, diastolic heart failure, coronary artery disease and comes in because of pain in her left hip which has been progressively getting worse over the last 3 days.  She denies any trauma, but has noted pain with ambulating.  Initially, she noted that pain was improved if she externally rotated her leg, but now she cannot bear weight even with external rotation.  She denies any back pain.  She points to an area on her thigh which she states is new but she is just pointing to one of the hamstring tendons.  Of note, she cannot take NSAIDs because of problems with esophagitis.   Home Medications Prior to Admission medications   Medication Sig Start Date End Date Taking? Authorizing Provider  alendronate (FOSAMAX) 70 MG tablet Take by mouth. 11/25/22   [provider]  amLODipine (NORVASC) 2.5 MG tablet Take 2.5 mg by mouth daily. 12/27/22   [provider]  aspirin EC 81 MG tablet TAKE 1 TABLET (81 MG TOTAL) BY MOUTH DAILY. SWALLOW WHOLE. 10/02/22   Jodelle Red, MD  meclizine (ANTIVERT) 25 MG tablet Take 1 tablet three times daily (every 8 hours) for 3 days and then take as needed for dizziness 12/07/21   Osvaldo Shipper, MD  metoprolol tartrate (LOPRESSOR) 100 MG tablet Take 1 tablet (100 mg total) by mouth 2 (two) times daily. 03/10/23   Motwani, Carin Hock, MD  metoprolol tartrate (LOPRESSOR) 50 MG tablet TAKE 1 TABLET BY MOUTH TWICE A DAY 05/22/23   Motwani, Komal, MD  nitroGLYCERIN (NITROSTAT) 0.4 MG SL tablet PLACE 1 TABLET (0.4 MG TOTAL) UNDER THE TONGUE EVERY 5 (FIVE) MINUTES AS NEEDED FOR CHEST PAIN. MAY REPEAT 2 TIMES,  IF 3RD DOSE NEEDED CALL 911 03/10/23 06/08/23  Jodelle Red, MD  spironolactone (ALDACTONE) 25 MG tablet Take 1 tablet (25 mg total) by mouth 2 (two) times daily. NEED APPOINTMENT 05/27/23   Jodelle Red, MD  Vitamin D, Ergocalciferol, (DRISDOL) 1.25 MG (50000 UNIT) CAPS capsule Take 50,000 Units by mouth every 7 (seven) days.    [provider]      Allergies    Amiloride; Anesthetics, amide; Other; Prilocaine; Amlodipine besylate; Augmentin [amoxicillin-pot clavulanate]; Cardizem [diltiazem hcl]; Cefdinir; Diltiazem hcl; Eplerenone; Indomethacin; Naproxen; Tenex [guanfacine hcl]; Triamterene-hctz; Cephalexin; Hydralazine hcl; Nebivolol hcl; Potassium chloride; Hytrin [terazosin hcl]; and Lisinopril    Review of Systems   Review of Systems  All other systems reviewed and are negative.   Physical Exam Updated Vital Signs BP (!) 163/83 (BP Location: Right Arm)   Pulse 80   Temp 98 F (36.7 C) (Oral)   Resp 20   Ht 5\' 4"  (1.626 m)   Wt 60.8 kg   SpO2 100%   BMI 23.00 kg/m  Physical Exam Vitals and nursing note reviewed.   77 year old female, resting comfortably and in no acute distress. Vital signs are significant for elevated blood pressure. Oxygen saturation is 100%, which is normal. Head is normocephalic and atraumatic. PERRLA, EOMI.  Back is nontender and there is no CVA tenderness.  Straight leg raise is negative. Lungs are clear without rales, wheezes, or rhonchi. Chest  is nontender. Heart has regular rate and rhythm without murmur. Abdomen is soft, flat, nontender without masses or hepatosplenomegaly and peristalsis is normoactive. Extremities have trace edema.  There is tenderness to palpation over the lateral aspect of the left hip.  There is pain with full external rotation of the left hip. Skin is warm and dry without rash. Neurologic: Mental status is normal, cranial nerves are intact, moves all extremities equally.  ED Results / Procedures /  Treatments   Labs (all labs ordered are listed, but only abnormal results are displayed) Labs Reviewed  BASIC METABOLIC PANEL - Abnormal; Notable for the following components:      Result Value   Creatinine, Ser 1.04 (*)    GFR, Estimated 56 (*)    All other components within normal limits  CBC WITH DIFFERENTIAL/PLATELET - Abnormal; Notable for the following components:   RBC 3.83 (*)    Hemoglobin 11.4 (*)    HCT 32.7 (*)    All other components within normal limits  MAGNESIUM   Radiology DG Hip Unilat W or Wo Pelvis 2-3 Views Left  Result Date: 05/29/2023 CLINICAL DATA:  Left leg pain/swelling EXAM: DG HIP (WITH OR WITHOUT PELVIS) 2-3V LEFT COMPARISON:  None Available. FINDINGS: No fracture or dislocation is seen. Bilateral joint spaces are preserved. Visualized bony pelvis appears intact. IMPRESSION: Negative. Electronically Signed   By: Charline Bills M.D.   On: 05/29/2023 02:00    Procedures Procedures    Medications Ordered in ED Medications - No data to display  ED Course/ Medical Decision Making/ A&P                                 Medical Decision Making Amount and/or Complexity of Data Reviewed Radiology: ordered.  Risk Prescription drug management.   Left hip pain strongly suggestive of hip arthritis, consider bursitis.  No evidence of sciatica on exam.  I have reviewed laboratory tests ordered at triage, and my interpretation is stable anemia and normal basic metabolic panel.  I ordered a hip x-ray, and radiologist's interpretation is negative.  However, I have reviewed the images and note some mild changes of arthritis.    I am ordering a prescription for celecoxib at a low dose to see if she tolerates it, and it can be increased as needed as an outpatient.  Have advised her to apply ice, use walking aids such as crutches, cane, walker.  I am referring her to orthopedics for follow-up.  Exam is consistent with bursitis and she may benefit from steroid injection  although I am concerned that she actually may need hip replacement.  Final Clinical Impression(s) / ED Diagnoses Final diagnoses:  Pain in left hip  Normochromic normocytic anemia  Elevated blood pressure reading with diagnosis of hypertension    Rx / DC Orders ED Discharge Orders          Ordered    celecoxib (CELEBREX) 100 MG capsule  Daily        05/29/23 0155              Dione Booze, MD 05/29/23 0207

## 2023-06-04 ENCOUNTER — Encounter (HOSPITAL_BASED_OUTPATIENT_CLINIC_OR_DEPARTMENT_OTHER): Payer: Self-pay | Admitting: Cardiology

## 2023-06-04 ENCOUNTER — Ambulatory Visit (HOSPITAL_BASED_OUTPATIENT_CLINIC_OR_DEPARTMENT_OTHER): Payer: PPO | Admitting: Cardiology

## 2023-06-04 VITALS — BP 138/82 | HR 88 | Ht 65.0 in | Wt 127.0 lb

## 2023-06-04 DIAGNOSIS — I10 Essential (primary) hypertension: Secondary | ICD-10-CM | POA: Diagnosis not present

## 2023-06-04 DIAGNOSIS — E269 Hyperaldosteronism, unspecified: Secondary | ICD-10-CM

## 2023-06-04 DIAGNOSIS — E785 Hyperlipidemia, unspecified: Secondary | ICD-10-CM

## 2023-06-04 DIAGNOSIS — I2510 Atherosclerotic heart disease of native coronary artery without angina pectoris: Secondary | ICD-10-CM | POA: Diagnosis not present

## 2023-06-04 DIAGNOSIS — I252 Old myocardial infarction: Secondary | ICD-10-CM | POA: Diagnosis not present

## 2023-06-04 MED ORDER — NITROGLYCERIN 0.4 MG SL SUBL: 0.4000 mg | SUBLINGUAL_TABLET | SUBLINGUAL | 1 refills | Status: AC | PRN

## 2023-06-04 MED ORDER — SPIRONOLACTONE 25 MG PO TABS
25.0000 mg | ORAL_TABLET | Freq: Every day | ORAL | 3 refills | Status: DC
Start: 2023-06-04 — End: 2023-06-05

## 2023-06-04 NOTE — Progress Notes (Signed)
Cardiology Office Note:  .    Date:  06/04/2023  ID:  Anne [name], DOB 01/31/1946, MRN [id number] PCP: Delma Officer, PA  [facility] Providers Cardiologist:  Jodelle Red, MD     History of Present Illness: Anne [name]    Anne [name] is a 77 y.o. female with a hx of CAD with NSTEMI 03/2020 s/p PCI to pLAD, diverticulosis, hiatal hernia, hypercholesterolemia, hypertension, and osteopenia, who is seen for cardiology follow up today. She was previously seen by Dr. [place], and my initial visit with her was 03/27/21.   At her visit 05/2022, her cholesterol was an ongoing issue. She was avoiding high cholesterol foods and monitoring her diet carefully. Followed recommendations on high fiber, quality ingredients, and limiting her sodium to 1500 mg or less daily. Her weight had been fairly consistent. Previously intolerant of statins due to cramps. She was pending evaluation for BAM at her next GI appointment. Her home blood pressures ranged 120/70 up to 130/80 when she was more active, corresponding with headaches and dizziness. We discussed statin alternatives, specifically nexletol and PCSK9i which she would consider and let us know if she would like to proceed.  Today, she states she needs to be seen for further refills of her spironolactone. Lately she confirms that her potassium has been stable taking spironolactone once daily. She usually takes her medication every day in the mornings, may rarely miss a dose. In the office her blood pressure is 138/82, despite her known white coat syndrome. She has not needed to take nitroglycerin very often. If her blood pressure is high, and her nitroglycerin is not nearby she will massage her scalp and carotid arteries to help lower her blood pressures.  In 01/2023, she had been seeing an endocrinologist for evaluation of possible hyperaldosteronism which was not confirmed. She had went off of spironolactone for the testing and her blood  pressures became labile for a time.   She has been working with a dietician and reports that her last LDL improved and her HDL has increased. She continues to monitor her sodium intake, stays below 1500 mg daily. She would prefer to avoid any injectable medications.  She denies any palpitations, chest pain, shortness of breath, peripheral edema, lightheadedness, headaches, syncope, orthopnea, or PND.  ROS:  Please see the history of present illness. ROS otherwise negative except as noted.   Studies Reviewed: Anne [name]         Physical Exam:    VS:  BP 138/82   Pulse 88   Ht 5\' 5"  (1.651 m)   Wt 127 lb (57.6 kg)   SpO2 98%   BMI 21.13 kg/m    Wt Readings from Last 3 Encounters:  06/04/23 127 lb (57.6 kg)  05/28/23 134 lb (60.8 kg)  03/10/23 134 lb 3.2 oz (60.9 kg)    GEN: Well nourished, well developed in no acute distress HEENT: Normal, moist mucous membranes NECK: No JVD CARDIAC: regular rhythm, normal S1 and S2, no rubs or gallops. No murmur. VASCULAR: Radial and DP pulses 2+ bilaterally. No carotid bruits RESPIRATORY:  Clear to auscultation without rales, wheezing or rhonchi  ABDOMEN: Soft, non-tender, non-distended MUSCULOSKELETAL:  Ambulates independently SKIN: Warm and dry, no edema NEUROLOGIC:  Alert and oriented x 3. No focal neuro deficits noted. PSYCHIATRIC:  Normal affect   ASSESSMENT AND PLAN: .    CAD, without current angina History of NSTEMI 03/2020: -on aspirin, had mild side effects on clopidogrel that have resolved since completing 1  year of DAPT -no angina, has old SL NG, will refill today -reviewed red flag warning signs that need immediate medical attention.   Hypercholesterolemia -LDL goal <70.  -she did not tolerate atorvastatin or ezetimibe, feeling that it affected her strength -We have discussed statin alternatives at length, particularly nexletol and PCSK9i. Discussed that she is high risk given her known CAD and prior NSTEMI.  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  -working on lifestyle changes, especially diet -has extensive medication intolerances, reviewed.   Cardiac risk counseling and prevention recommendations: -recommend heart healthy/Mediterranean diet, with whole grains, fruits, vegetable, fish, lean meats, nuts, and olive oil. Limit salt. -recommend moderate walking, 3-5 times/week for 30-50 minutes each session. Aim for at least 150 minutes.week. Goal should be pace of 3 miles/hours, or walking 1.5 miles in 30 minutes -recommend avoidance of tobacco products. Avoid excess alcohol.  Dispo: Follow-up in 1 year, or sooner as needed.  I,Mathew Stumpf,acting as a Neurosurgeon for [employer], MD.,have documented all relevant documentation on the behalf of Jodelle Red, MD,as directed by  Jodelle Red, MD while in the presence of Jodelle Red, MD.  I, Jodelle Red, MD, have reviewed all documentation for this visit. The documentation on 06/04/23 for the exam, diagnosis, procedures, and orders are all accurate and complete.   Signed, Carlena Bjornstad

## 2023-06-04 NOTE — Patient Instructions (Signed)
Medication Instructions:  Your physician recommends that you continue on your current medications as directed. Please refer to the Current Medication list given to you today.  Follow-Up: At [facility], you and your health needs are our priority.  As part of our continuing mission to provide you with exceptional heart care, we have created designated Provider Care Teams.  These Care Teams include your primary Cardiologist (physician) and Advanced Practice Providers (APPs -  Physician Assistants and Nurse Practitioners) who all work together to provide you with the care you need, when you need it.  We recommend signing up for the patient portal called "MyChart".  Sign up information is provided on this After Visit Summary.  MyChart is used to connect with patients for Virtual Visits (Telemedicine).  Patients are able to view lab/test results, encounter notes, upcoming appointments, etc.  Non-urgent messages can be sent to your provider as well.   To learn more about what you can do with MyChart, go to [email or web address].    Your next appointment:   1 year with Dr. Cristal Deer

## 2023-06-05 ENCOUNTER — Encounter (HOSPITAL_BASED_OUTPATIENT_CLINIC_OR_DEPARTMENT_OTHER): Payer: Self-pay | Admitting: Family

## 2023-06-05 ENCOUNTER — Ambulatory Visit (HOSPITAL_BASED_OUTPATIENT_CLINIC_OR_DEPARTMENT_OTHER): Payer: PPO | Admitting: Family

## 2023-06-05 VITALS — BP 173/82 | HR 82 | Ht 64.0 in | Wt 130.1 lb

## 2023-06-05 DIAGNOSIS — I25118 Atherosclerotic heart disease of native coronary artery with other forms of angina pectoris: Secondary | ICD-10-CM | POA: Diagnosis not present

## 2023-06-05 DIAGNOSIS — I1A0 Resistant hypertension: Secondary | ICD-10-CM

## 2023-06-05 DIAGNOSIS — E785 Hyperlipidemia, unspecified: Secondary | ICD-10-CM

## 2023-06-05 MED ORDER — ISOSORBIDE MONONITRATE ER 30 MG PO TB24
30.0000 mg | ORAL_TABLET | Freq: Every day | ORAL | 2 refills | Status: AC
Start: 2023-06-05 — End: 2023-09-03

## 2023-06-05 NOTE — Progress Notes (Signed)
[facility] Initial Assessment:    Date:  06/05/2023   ID:  Anne [name], DOB 19-Sep-1945, MRN [id number]  PCP:  Delma Officer, PA  Cardiologist:  Jodelle Red, MD  Nephrologist:  Referring MD: Delma Officer, PA   CC: Hypertension  History of Present Illness:    Anne [name] is a 77 y.o. 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   Previously intolerant to statins myalgias.  She has declined alternatives including Nexletol and PCSK9.  She saw endocrinology 01/2023 for evaluation of possible hyperaldosteronism.  Plasma aldosterone 10, renin 0.57.  Unable to rule out primary hyperaldosteronism without salt loading however given hypertensive urgency it was deferred.  She was recommended to continue spironolactone 25 mg daily.  She just saw Dr. Cristal Deer yesterday with BP 138/82. However on calling to discuss appointment today with nursing team it was noted she had elevated BP and headache last night requiring nitroglycerin.  Anne [name] was diagnosed with hypertension many years ago. It has been difficult to control with multiple medication intolerances. Notes longstanding history of labile BP. Blood pressure checked with arm and wrist cuff at home which have been calibrated.  she reports tobacco use never. For exercise she has no formal routine but hopes to start walking. she eats at home and outside of the home and does follow low sodium diet. Restricts to 500mg  Na intake.  Does note some stressors over the last week after prolonged ED visit for hip pain 05/28/23 then subsequent oral surgery. Had to have post procedure antibiotics which she has completed. Also taking Tylenol-3 to help with pain. She has been doing warm salt water rinses and inquires if this could  contribute to her BP being elevated, discussed unlikely contributory.   Yesterday her head started during across the posterior part of her head. BP 189/114. She did take two nitroglycerin as her BP was high then called EMS. No chest pain, pressure, tightness. Her BP improved to 166/96. Tells me "it is very clear that something is not going right". She states that she does not have hyperaldosteronism, we reviewed that testing with endocrinology was inconclusive as unable to complete salt loading to confirm hyperaldosteronism. She is very concerned about spironolactone and dietary restrictions. She reports previously on 50mg  dose she had an episode of hyperkalemia which has not recurred. She has been restricting her potassium intake and is frustrated by this. Labs two weeks ago with K 4.0.   Never tried: Minoxidil, Clonidine, Imdur  Previous antihypertensives: Nebivolol - disoriented Carvedilol - hypotension Diltiazem - severe fatigue Valsartan - GI distress Hydralazine - fluid retention Metoprolol - hypotension Triamterene-HCTZ - dehydration Amlodipine - swelling Lisinopril - cough Losartan - hair loss Doxazosin - dizziness Eplerenone - hair loss  Past Medical History:  Diagnosis Date   Complication of anesthesia    slow to wake up   Diverticulosis    Hiatal hernia    Hypercholesterolemia    Hypertension    Osteopenia    Type 2 diabetes mellitus with stage 3a chronic kidney disease, without long-term current use of insulin (HCC) 12/05/2021   Vitamin D deficiency     Past Surgical History:  Procedure Laterality Date   ABDOMINAL HYSTERECTOMY     Partial   AUGMENTATION MAMMAPLASTY     BREAST BIOPSY Left 1975   BREAST BIOPSY Right 1975  CORONARY STENT INTERVENTION N/A 03/16/2020   Procedure: CORONARY STENT INTERVENTION;  Surgeon: Kathleene Hazel, MD;  Location: [facility];  Service: Cardiovascular;  Laterality: N/A;   fibrocystic breast excision     LAPAROSCOPIC  ILEOCECECTOMY  07/07/2015   LAPAROTOMY N/A 07/07/2015   Procedure: EXPLORATORY LAPAROTOMY WITH ILEOCECECTOMY ;  Surgeon: Manus Rudd, MD;  Location: [facility] OR;  Service: General;  Laterality: N/A;   LEFT HEART CATH AND CORONARY ANGIOGRAPHY N/A 03/16/2020   Procedure: LEFT HEART CATH AND CORONARY ANGIOGRAPHY;  Surgeon: Kathleene Hazel, MD;  Location: [facility];  Service: Cardiovascular;  Laterality: N/A;   TUBAL LIGATION      Current Medications: Current Meds  Medication Sig   acetaminophen-codeine (TYLENOL #3) 300-30 MG tablet Take 1 tablet by mouth every 4 (four) hours as needed.   alendronate (FOSAMAX) 70 MG tablet Take by mouth.   amoxicillin (AMOXIL) 500 MG capsule Take 500 mg by mouth 3 (three) times daily.   aspirin EC 81 MG tablet TAKE 1 TABLET (81 MG TOTAL) BY MOUTH DAILY. SWALLOW WHOLE.   isosorbide mononitrate (IMDUR) 30 MG 24 hr tablet Take 1 tablet (30 mg total) by mouth daily.   nitroGLYCERIN (NITROSTAT) 0.4 MG SL tablet Place 1 tablet (0.4 mg total) under the tongue every 5 (five) minutes as needed for chest pain. May repeat 2 times, if 3rd dose needed call 911   [DISCONTINUED] spironolactone (ALDACTONE) 25 MG tablet Take 1 tablet (25 mg total) by mouth daily.     Allergies:   Amiloride; Anesthetics, amide; Other; Prilocaine; Amlodipine besylate; Augmentin [amoxicillin-pot clavulanate]; Cardizem [diltiazem hcl]; Cefdinir; Diltiazem hcl; Eplerenone; Indomethacin; Naproxen; Tenex [guanfacine hcl]; Triamterene-hctz; Cephalexin; Hydralazine hcl; Nebivolol hcl; Potassium chloride; Hytrin [terazosin hcl]; and Lisinopril   Social History   Socioeconomic History   Marital status: Married    Spouse name: Not on file   Number of children: 2   Years of education: Not on file   Highest education level: Not on file  Occupational History   Occupation: retired  Tobacco Use   Smoking status: Never   Smokeless tobacco: Never  Vaping Use   Vaping status: Never Used   Substance and Sexual Activity   Alcohol use: Yes    Comment: 3 times a year   Drug use: No   Sexual activity: Yes    Birth control/protection: Surgical  Other Topics Concern   Not on file  Social History Narrative   Not on file   Social Determinants of Health   Financial Resource Strain: Not on file  Food Insecurity: Not on file  Transportation Needs: Not on file  Physical Activity: Not on file  Stress: Not on file  Social Connections: Unknown (01/17/2022)   Received from [facility]   Social Network    Social Network: Not on file     Family History: The patient's family history includes Hypertension in her mother; Stroke in her brother and mother. There is no history of Cancer, Alcohol abuse, Early death, Hearing loss, Heart disease, Hyperlipidemia, or Kidney disease.  ROS:   Please see the history of present illness.     All other systems reviewed and are negative.  EKGs/Labs/Other Studies Reviewed:         Recent Labs: 05/28/2023: BUN 15; Creatinine, Ser 1.04; Hemoglobin 11.4; Magnesium 2.2; Platelets 186; Potassium 4.0; Sodium 135   Recent Lipid Panel    Component Value Date/Time   CHOL 164 12/05/2021 0211   CHOL 206 (  H) 03/28/2021 0844   TRIG 31 12/05/2021 0211   HDL 60 12/05/2021 0211   HDL 78 03/28/2021 0844   CHOLHDL 2.7 12/05/2021 0211   VLDL 6 12/05/2021 0211   LDLCALC 98 12/05/2021 0211   LDLCALC 118 (H) 03/28/2021 0844   LDLDIRECT 158.7 09/30/2013 1255    Physical Exam:   VS:  BP (!) 173/82 Comment: right arm  Pulse 82   Ht 5\' 4"  (1.626 m)   Wt 130 lb 1.6 oz (59 kg)   SpO2 97%   BMI 22.33 kg/m  , BMI Body mass index is 22.33 kg/m. GENERAL:  Well appearing HEENT: Pupils equal round and reactive, fundi not visualized, oral mucosa unremarkable NECK:  No jugular venous distention, waveform within normal limits, carotid upstroke brisk and symmetric, no bruits, no thyromegaly LYMPHATICS:  No cervical adenopathy LUNGS:  Clear to  auscultation bilaterally HEART:  RRR.  PMI not displaced or sustained,S1 and S2 within normal limits, no S3, no S4, no clicks, no rubs, no murmurs ABD:  Flat, positive bowel sounds normal in frequency in pitch, no bruits, no rebound, no guarding, no midline pulsatile mass, no hepatomegaly, no splenomegaly EXT:  2 plus pulses throughout, no edema, no cyanosis no clubbing SKIN:  No rashes no nodules NEURO:  Cranial nerves II through XII grossly intact, motor grossly intact throughout PSYCH:  Cognitively intact, oriented to person place and time   ASSESSMENT/PLAN:    HTN / Possible hyperaldosteronism -  BP not at goal <130/80. Prior intolerance to many medications detailed above. She finds Spironoalctone restrictive and requests to discontinue. She has been completely avoiding potassium rich foods. Discussed her inconclusive hyperaldosterone testing recommended to continue Sprionolactone and eat moderate amount of potassium in dietary sources but she is insistent on discontinuing. Stop Spironolactone, start Imdur 30mg  daily. Anticipate she will tolerate well as has no side effects with PRN nitroglycerin. She requests to start at lowest dose. Anticipate will required increased dose in future, check in via phone Monday.  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   CAD / HLD, LDL goal <70 - Stable with no anginal symptoms. No indication for ischemic evaluation.  Previous myalgia with statin. Lipids managed with diet/exercise per her preference. Previously declined PCSK9i, Nexletol.  Screening for Secondary Hypertension:     06/05/2023    4:19 PM  Causes  Renovascular HTN Screened     - Comments 05/2023 renal  duplex ordered  Thyroid Disease Screened     - Comments 09/2022 normal TSH  Hyperaldosteronism Screened     - Comments 02/2023 aldosterone 10 (indeterminite for hyperaldosteronism) with endocrinology while off Spironolactone. Unable to complete salt loading to confirm diagnosis due to markedly elevated BP.  Pheochromocytoma Screened     - Comments 09/2021 normal adrenal glands  Cushing's Syndrome N/A     - Comments non cushinoid appearance  Coarctation of the Aorta N/A     - Comments BP symmetrical. 2021 CT unremarkable aorta  Compliance Screened    Relevant Labs/Studies:    Latest Ref Rng & Units 05/28/2023    3:36 PM 02/25/2023    8:36 AM 01/14/2023   11:40 AM  Basic Labs  Sodium 135 - 145 mmol/L 135  139  139   Potassium 3.5 - 5.1  mmol/L 4.0  3.5  4.0   Creatinine 0.44 - 1.00 mg/dL 9.62  9.52  8.41        Latest Ref Rng & Units 03/01/2021    4:29 PM 12/21/2019    8:54 AM  Thyroid   TSH 0.450 - 4.500 uIU/mL 1.960  2.747        Latest Ref Rng & Units 02/25/2023    8:36 AM 01/14/2023   11:40 AM 03/16/2020    5:17 AM  Renin/Aldosterone   Aldosterone --  ng/dL 10  8  32.4        Latest Ref Rng & Units 01/14/2023   11:40 AM  Metanephrines/Catecholamines   Metanephrines <=57 pg/mL 25   Normetanephrines  <=148 pg/mL 200        Latest Ref Rng & Units 01/14/2023   11:40 AM 12/05/2021    4:28 PM  Cortisol  Cortisol  ug/dL 7.6  3.8        12/09/270    2:39 PM  Renovascular   Renal Artery [place] Completed Yes       Disposition:    FU with MD/PharmD in 1 month    Medication Adjustments/Labs and Tests Ordered: Current medicines are reviewed at length with the patient today.  Concerns regarding medicines are outlined above.  Orders Placed This Encounter  Procedures   VAS US RENAL ARTERY DUPLEX   Meds ordered this encounter  Medications   isosorbide mononitrate (IMDUR) 30 MG 24 hr tablet    Sig: Take 1 tablet (30 mg total) by mouth daily.    Dispense:  30 tablet    Refill:  2     Order Specific Question:   Supervising Provider    Answer:   Jodelle Red [[id number]]     Signed, Alver Sorrow, NP  06/05/2023 4:21 PM    [facility]

## 2023-06-05 NOTE — Patient Instructions (Addendum)
Medication Instructions:   STOP Spironolactone per your preference  START Isosorbide Mononitrate 30mg  daily  Testing:  Your physician has requested that you have a renal artery duplex. During this test, an ultrasound is used to evaluate blood flow to the kidneys. Allow one hour for this exam. Do not eat after midnight the day before and avoid carbonated beverages. Take your medications as you usually do.    Follow-Up: In 1 month with Hypertension Clinic

## 2023-06-09 ENCOUNTER — Telehealth: Payer: Self-pay | Admitting: Cardiology

## 2023-06-09 ENCOUNTER — Other Ambulatory Visit: Payer: Self-pay

## 2023-06-09 ENCOUNTER — Emergency Department (HOSPITAL_BASED_OUTPATIENT_CLINIC_OR_DEPARTMENT_OTHER)
Admission: EM | Admit: 2023-06-09 | Discharge: 2023-06-09 | Disposition: A | Discharge status: Home / Self Care | Payer: PPO | Attending: Emergency Medicine | Admitting: Emergency Medicine

## 2023-06-09 ENCOUNTER — Telehealth (HOSPITAL_BASED_OUTPATIENT_CLINIC_OR_DEPARTMENT_OTHER): Payer: Self-pay | Admitting: Cardiology

## 2023-06-09 ENCOUNTER — Encounter (HOSPITAL_BASED_OUTPATIENT_CLINIC_OR_DEPARTMENT_OTHER): Payer: Self-pay

## 2023-06-09 ENCOUNTER — Emergency Department (HOSPITAL_BASED_OUTPATIENT_CLINIC_OR_DEPARTMENT_OTHER): Payer: PPO

## 2023-06-09 DIAGNOSIS — M542 Cervicalgia: Secondary | ICD-10-CM | POA: Diagnosis not present

## 2023-06-09 DIAGNOSIS — R519 Headache, unspecified: Secondary | ICD-10-CM | POA: Insufficient documentation

## 2023-06-09 DIAGNOSIS — Z7982 Long term (current) use of aspirin: Secondary | ICD-10-CM | POA: Diagnosis not present

## 2023-06-09 DIAGNOSIS — R42 Dizziness and giddiness: Secondary | ICD-10-CM | POA: Insufficient documentation

## 2023-06-09 DIAGNOSIS — I6601 Occlusion and stenosis of right middle cerebral artery: Secondary | ICD-10-CM | POA: Diagnosis not present

## 2023-06-09 DIAGNOSIS — I672 Cerebral atherosclerosis: Secondary | ICD-10-CM | POA: Diagnosis not present

## 2023-06-09 DIAGNOSIS — H538 Other visual disturbances: Secondary | ICD-10-CM | POA: Diagnosis not present

## 2023-06-09 DIAGNOSIS — I6523 Occlusion and stenosis of bilateral carotid arteries: Secondary | ICD-10-CM | POA: Diagnosis not present

## 2023-06-09 LAB — CBC WITH DIFFERENTIAL/PLATELET
Abs Immature Granulocytes: 0.01 10*3/uL (ref 0.00–0.07)
Basophils Absolute: 0 10*3/uL (ref 0.0–0.1)
Basophils Relative: 1 %
Eosinophils Absolute: 0.1 10*3/uL (ref 0.0–0.5)
Eosinophils Relative: 1 %
HCT: 33.8 % — ABNORMAL LOW (ref 36.0–46.0)
Hemoglobin: 11.7 g/dL — ABNORMAL LOW (ref 12.0–15.0)
Immature Granulocytes: 0 %
Lymphocytes Relative: 28 %
Lymphs Abs: 1 10*3/uL (ref 0.7–4.0)
MCH: 29.3 pg (ref 26.0–34.0)
MCHC: 34.6 g/dL (ref 30.0–36.0)
MCV: 84.7 fL (ref 80.0–100.0)
Monocytes Absolute: 0.3 10*3/uL (ref 0.1–1.0)
Monocytes Relative: 7 %
Neutro Abs: 2.4 10*3/uL (ref 1.7–7.7)
Neutrophils Relative %: 63 %
Platelets: 169 10*3/uL (ref 150–400)
RBC: 3.99 MIL/uL (ref 3.87–5.11)
RDW: 13.8 % (ref 11.5–15.5)
WBC: 3.7 10*3/uL — ABNORMAL LOW (ref 4.0–10.5)
nRBC: 0 % (ref 0.0–0.2)

## 2023-06-09 LAB — BASIC METABOLIC PANEL
Anion gap: 7 (ref 5–15)
BUN: 13 mg/dL (ref 8–23)
CO2: 25 mmol/L (ref 22–32)
Calcium: 9.4 mg/dL (ref 8.9–10.3)
Chloride: 106 mmol/L (ref 98–111)
Creatinine, Ser: 0.95 mg/dL (ref 0.44–1.00)
GFR, Estimated: >60 mL/min (ref >60)
Glucose, Bld: 98 mg/dL (ref 70–99)
Potassium: 4.7 mmol/L (ref 3.5–5.1)
Sodium: 138 mmol/L (ref 135–145)

## 2023-06-09 MED ORDER — IOHEXOL 350 MG/ML SOLN
100.0000 mL | Freq: Once | INTRAVENOUS | Status: AC | PRN
  Administered 2023-06-09: 75 mL via INTRAVENOUS

## 2023-06-09 MED ORDER — ACETAMINOPHEN 500 MG PO TABS
1000.0000 mg | ORAL_TABLET | Freq: Once | ORAL | Status: AC
  Administered 2023-06-09: 1000 mg via ORAL
  Filled 2023-06-09: qty 2

## 2023-06-09 MED ORDER — CLONIDINE HCL 0.1 MG PO TABS
0.2000 mg | ORAL_TABLET | Freq: Once | ORAL | Status: AC
  Administered 2023-06-09: 0.2 mg via ORAL
  Filled 2023-06-09: qty 2

## 2023-06-09 MED ORDER — CLONIDINE HCL 0.2 MG PO TABS: 0.2000 mg | ORAL_TABLET | Freq: Two times a day (BID) | ORAL | 0 refills | Status: DC

## 2023-06-09 NOTE — ED Provider Notes (Signed)
[place] EMERGENCY DEPARTMENT AT [facility] Provider Note   CSN: [id number] Arrival date & time: 06/09/23  1002     History  Chief Complaint  Patient presents with   Dizziness   Hypertension    Anne [name] is a 77 y.o. female.  This is a 77 year old female who presents emergency department today due to head and neck pain.  Patient says that her symptoms began since her primary care physician switched her medication to Imdur from spironolactone a few days ago.  Says that every time she takes Imdur, her neck and head started to hurt.  She did not take it since yesterday, and her blood pressure was high this morning so she took nitroglycerin.   Dizziness Hypertension       Home Medications Prior to Admission medications   Medication Sig Start Date End Date Taking? Authorizing Provider  cloNIDine (CATAPRES) 0.2 MG tablet Take 1 tablet (0.2 mg total) by mouth 2 (two) times daily. 06/09/23 07/09/23 Yes Anders Simmonds T, DO  acetaminophen-codeine (TYLENOL #3) 300-30 MG tablet Take 1 tablet by mouth every 4 (four) hours as needed. 05/29/23   [provider]  alendronate (FOSAMAX) 70 MG tablet Take by mouth. 11/25/22   [provider]  amoxicillin (AMOXIL) 500 MG capsule Take 500 mg by mouth 3 (three) times daily. 05/29/23   [provider]  aspirin EC 81 MG tablet TAKE 1 TABLET (81 MG TOTAL) BY MOUTH DAILY. SWALLOW WHOLE. 10/02/22   Jodelle Red, MD  isosorbide mononitrate (IMDUR) 30 MG 24 hr tablet Take 1 tablet (30 mg total) by mouth daily. 06/05/23 09/03/23  Alver Sorrow, NP  nitroGLYCERIN (NITROSTAT) 0.4 MG SL tablet Place 1 tablet (0.4 mg total) under the tongue every 5 (five) minutes as needed for chest pain. May repeat 2 times, if 3rd dose needed call 911 06/04/23 06/02/24  Jodelle Red, MD      Allergies    Amiloride; Anesthetics, amide; Other; Prilocaine; Amlodipine besylate; Augmentin [amoxicillin-pot  clavulanate]; Cardizem [diltiazem hcl]; Cefdinir; Diltiazem hcl; Eplerenone; Indomethacin; Naproxen; Tenex [guanfacine hcl]; Triamterene-hctz; Cephalexin; Hydralazine hcl; Nebivolol hcl; Potassium chloride; Hytrin [terazosin hcl]; and Lisinopril    Review of Systems   Review of Systems  Neurological:  Positive for dizziness.    Physical Exam Updated Vital Signs BP (!) 147/72   Pulse (!) 58   Temp 97.9 F (36.6 C)   Resp 12   SpO2 98%  Physical Exam Vitals reviewed.  Constitutional:      Appearance: Normal appearance.  HENT:     Head: Normocephalic and atraumatic.     Mouth/Throat:     Mouth: Mucous membranes are moist.  Eyes:     Pupils: Pupils are equal, round, and reactive to light.  Cardiovascular:     Rate and Rhythm: Normal rate.  Pulmonary:     Effort: Pulmonary effort is normal.  Abdominal:     General: There is no distension.  Neurological:     General: No focal deficit present.     Mental Status: She is alert and oriented to person, place, and time.     Cranial Nerves: No cranial nerve deficit.     Motor: No weakness.     Gait: Gait normal.     ED Results / Procedures / Treatments   Labs (all labs ordered are listed, but only abnormal results are displayed) Labs Reviewed  CBC WITH DIFFERENTIAL/PLATELET - Abnormal; Notable for the following components:      Result Value  WBC 3.7 (*)    Hemoglobin 11.7 (*)    HCT 33.8 (*)    All other components within normal limits  BASIC METABOLIC PANEL    EKG None  Radiology CT ANGIO HEAD NECK W WO CM  Result Date: 06/09/2023 CLINICAL DATA:  76 year old female with hypertension, dizziness, blurred vision, stiffness. EXAM: CT ANGIOGRAPHY HEAD AND NECK WITH AND WITHOUT CONTRAST TECHNIQUE: Multidetector CT imaging of the head and neck was performed using the standard protocol during bolus administration of intravenous contrast. Multiplanar CT image reconstructions and MIPs were obtained to evaluate the vascular  anatomy. Carotid stenosis measurements (when applicable) are obtained utilizing NASCET criteria, using the distal internal carotid diameter as the denominator. RADIATION DOSE REDUCTION: This exam was performed according to the departmental dose-optimization program which includes automated exposure control, adjustment of the mA and/or kV according to patient size and/or use of iterative reconstruction technique. CONTRAST:  75mL OMNIPAQUE IOHEXOL 350 MG/ML SOLN COMPARISON:  Brain MRI 01/31/2022. Prior CTA head and neck 12/03/2021. and earlier. FINDINGS: CT HEAD Brain: Stable cerebral volume, normal for age. No midline shift, ventriculomegaly, mass effect, evidence of mass lesion, intracranial hemorrhage or evidence of cortically based acute infarction. Patchy cerebral white matter and deep gray nuclei hypodensity appears stable, similar to T2 and FLAIR heterogeneity on the MRI last year. Calvarium and skull base: Intact, negative. Paranasal sinuses: Partial opacification of the posterior right ethmoid air cells is chronic. And other visible paranasal sinuses, middle ears and mastoids remain well aerated. Orbits: Negative visible orbit and scalp soft tissues. CTA NECK Skeleton: Absent dentition. Chronic cervical disc and endplate degeneration. No acute osseous abnormality identified. Upper chest: Negative. Other neck: Negative. Aortic arch: Chronic aortic arch atherosclerosis. Three vessel arch configuration. Right carotid system: Brachiocephalic artery and right CCA origin are tortuous with no significant plaque or stenosis. Minimal plaque at the right ICA origin. No stenosis to the skull base. Left carotid system: Tortuosity with minimal plaque and no stenosis. Vertebral arteries: Mild proximal right subclavian artery plaque. Calcified plaque at the right vertebral artery origin. Stable mild if any right vertebral origin stenosis. Tortuous right V1 segment. Right vertebral artery is patent to the skull base with no  additional plaque or stenosis. Proximal left subclavian artery soft and calcified plaque without stenosis. Calcified plaque at the left vertebral artery origin is stable with no significant stenosis. Tortuous left V1 segment with mild additional calcified plaque. Patent left vertebral artery to the skull base with no significant plaque or stenosis. Incidental early origin of the left PICA CTA HEAD Posterior circulation: Codominant distal vertebral arteries and vertebrobasilar junction are patent with no significant plaque or stenosis. Patent PICA origins, incidentally both PICA origins occur early, at the C1 level, and both extend separately through the dura. -patent basilar artery without stenosis. Patent SCA and PCA origins. Normal right posterior communicating artery, the left is diminutive or absent. Bilateral PCA branches are stable and within normal limits. Anterior circulation: Both ICA siphons are patent. Left siphon petrous, cavernous and supraclinoid segment calcified plaque with only mild stenosis. Right siphon cavernous and supraclinoid calcified plaque with only mild stenosis. Normal right posterior communicating artery origin. A small infundibulum of the distal left ICA is suspected and has not significantly changed since at least a 2017 CTA. Patent carotid termini, MCA and ACA origins. Stable A1 and M1 tortuosity. Diminutive or absent anterior communicating artery. Bilateral ACA branches are within normal limits. Left MCA M1 segment is tortuous and patent to the bifurcation without  stenosis. Right MCA M1 segment is tortuous and mildly irregular, with mild M1 segment stenosis appearing stable on series 9, image 20. Right MCA bifurcation is patent with similar mild irregularity and stenosis, stable. Right MCA branches are stable, with mild posterior M3 irregularity and stenosis. Venous sinuses: Patent. Anatomic variants: Stable small chronic infundibulum of the distal left ICA. Both PICA arise early at  the C1 level and extend separately through the dura. Review of the MIP images confirms the above findings IMPRESSION: 1. CTA negative for large vessel occlusion. Stable mild for age atherosclerosis in the neck and at the skull base with no hemodynamically significant large vessel stenosis. There are mild stenoses of the right MCA M1, bifurcation, and M3 branches. 2. No acute intracranial abnormality. Stable CT appearance of mild for age small vessel disease. 3.  Aortic Atherosclerosis (ICD10-I70.0). Electronically Signed   By: Odessa Fleming M.D.   On: 06/09/2023 13:08    Procedures Procedures    Medications Ordered in ED Medications  acetaminophen (TYLENOL) tablet 1,000 mg (1,000 mg Oral Given 06/09/23 1033)  cloNIDine (CATAPRES) tablet 0.2 mg (0.2 mg Oral Given 06/09/23 1047)  iohexol (OMNIPAQUE) 350 MG/ML injection 100 mL (75 mLs Intravenous Contrast Given 06/09/23 1125)    ED Course/ Medical Decision Making/ A&P                                 Medical Decision Making 77 year old female here today because she is concerned that her Imdur is causing her head and neck pain.  Differential diagnoses include ICH, vertebral artery dissection, musculoskeletal pain.  Plan-patient is endorsing pain that is even light to the touch on the neck, and head.  There is no rash.  Her blood pressure is not significantly elevated, I am not concerned about hypertensive urgency.  ...  I do not appreciate any neurological deficits on my exam I do not appreciate any neurological deficits on my exam, and it is a bit unusual that she is having pain to light palpation if there was something like an intracranial hemorrhage.  Discussed I found her exam overall reassuring, we could simply stop the medication if she could not tolerate it, try other medications, however patient requested a more thorough workup.  Spoke with the patient's primary care physician, Dr. Cristal Deer.  They had recommended trying clonidine if the  patient could not tolerate Imdur.  Will plan to send patient home with some clonidine.  Reassessment-1:15 PM.  My dependent review the patient's head CT shows no intracranial hemorrhage.  My independent review the patient's EKG shows no ST segment depressions or elevation, no T wave inversions, no evidence of acute ischemia.  Reviewed the patient's labs, mild leukopenia, not clinically significant.  Will discharge patient home on clonidine.  Amount and/or Complexity of Data Reviewed Labs: ordered. Radiology: ordered.  Risk OTC drugs. Prescription drug management.           Final Clinical Impression(s) / ED Diagnoses Final diagnoses:  Nonintractable headache, unspecified chronicity pattern, unspecified headache type    Rx / DC Orders ED Discharge Orders          Ordered    cloNIDine (CATAPRES) 0.2 MG tablet  2 times daily        06/09/23 1319              Anders Simmonds T, DO 06/09/23 1320

## 2023-06-09 NOTE — Telephone Encounter (Signed)
Returned call to patient's husband, informed him we have been in contact with the ED docs and will get her taken care of!

## 2023-06-09 NOTE — Telephone Encounter (Signed)
Pt c/o medication issue:  1. Name of Medication:  isosorbide mononitrate (IMDUR) 30 MG 24 hr tablet  2. How are you currently taking this medication (dosage and times per day)?   3. Are you having a reaction (difficulty breathing--STAT)?   4. What is your medication issue?   Patient states her BP is around 128/80. Patient states she cannot tolerate this medication. She states this medication has caused neck soreness/stiffness, dizziness, blurred vision, and the top of her head is sore to touch. She states she has taken 4 doses and today she skipped taking it due to the ongoing symptoms.

## 2023-06-09 NOTE — Telephone Encounter (Signed)
To go with call already routed to you

## 2023-06-09 NOTE — ED Triage Notes (Signed)
Pt c/o HTN, was advised to come for eval. Recent medication change Thursday- spironolactone to imdur. First tablet on Thursday, "when I got up Thursday evening, started getting stiff neck- all this stuff." Endorses dizziness, blurred vision. A&O4, NAD at time of triage

## 2023-06-09 NOTE — Telephone Encounter (Signed)
Discussed with ED provider as well. Low suspicion reported side effects are related to Imdur. ED provider providing recommendations. She will follow up as scheduled with Hypertension Clinic 06/12/23.   Alver Sorrow, NP

## 2023-06-09 NOTE — Telephone Encounter (Signed)
BP and medication update

## 2023-06-09 NOTE — ED Triage Notes (Signed)
Pt reports not having taken imdur since yesterday morning.

## 2023-06-09 NOTE — Telephone Encounter (Signed)
Good morning,   Patient husband Anne [name] came up from the Emergency room wanted Anne [name] and Anne [name] know that she is having a reaction to her Blood pressure medication. Currently in ER.  Husband would like for someone to call him please

## 2023-06-09 NOTE — Telephone Encounter (Signed)
Addressed via separate encounter.   Alver Sorrow, NP

## 2023-06-09 NOTE — Discharge Instructions (Addendum)
Your CT scan that was done of your head and your neck was normal.  Your labs today were also normal.  I spoke with your blood pressure doctor, and they recommended stopping the Imdur, and starting you on clonidine.  I have sent some to your pharmacy.  You have a follow-up appointment with them next week.

## 2023-06-12 ENCOUNTER — Encounter (HOSPITAL_BASED_OUTPATIENT_CLINIC_OR_DEPARTMENT_OTHER): Payer: Self-pay | Admitting: Family

## 2023-06-12 ENCOUNTER — Ambulatory Visit (HOSPITAL_BASED_OUTPATIENT_CLINIC_OR_DEPARTMENT_OTHER): Payer: PPO | Admitting: Family

## 2023-06-12 ENCOUNTER — Telehealth (HOSPITAL_BASED_OUTPATIENT_CLINIC_OR_DEPARTMENT_OTHER): Payer: Self-pay | Admitting: Family

## 2023-06-12 VITALS — BP 152/84 | Ht 64.0 in | Wt 131.0 lb

## 2023-06-12 DIAGNOSIS — I1A0 Resistant hypertension: Secondary | ICD-10-CM

## 2023-06-12 DIAGNOSIS — I25118 Atherosclerotic heart disease of native coronary artery with other forms of angina pectoris: Secondary | ICD-10-CM

## 2023-06-12 DIAGNOSIS — E785 Hyperlipidemia, unspecified: Secondary | ICD-10-CM

## 2023-06-12 DIAGNOSIS — Z789 Other specified health status: Secondary | ICD-10-CM | POA: Diagnosis not present

## 2023-06-12 DIAGNOSIS — T466X5A Adverse effect of antihyperlipidemic and antiarteriosclerotic drugs, initial encounter: Secondary | ICD-10-CM

## 2023-06-12 DIAGNOSIS — M791 Myalgia, unspecified site: Secondary | ICD-10-CM

## 2023-06-12 NOTE — Telephone Encounter (Signed)
FYI

## 2023-06-12 NOTE — Telephone Encounter (Signed)
Patient came to checkout to make a fu appt per her AVS. When I gave Anne [name] her options for fu visits, she said that was too far out with being put on a new new medicine. I proceeded to tell her that I was just following the providers recommendations but if she wanted a sooner follow up I would do what I could. Patient stated she did not feel like that either appropriate in terms of a follow up. She did not feel her needs were met or we cared that she had a reaction to this medicine. Patient left and did not schedule an appointment.

## 2023-06-12 NOTE — Telephone Encounter (Signed)
She was seen in ED 9/30 with change in medications and seen in clinic just 3 days later on 06/12/23. As she had been on new medication for less than one week, plan discussed in detail in clinic today as follows:  Continue Clonidine 0.2mg  BID Check in via MyChart message early next week (when she had been on new medication for one week).  If BP at that time at goal <130/80, transition to Clonidine 0.2mg  patch weekly and if BP not at goal, transition to Clonidine 0.3mg  patch weekly.   I am sorry to hear she is frustrated by her follow up options. Unfortunately we have limited availability. Fortunately she does have BP cuff for home monitoring and we are available via MyChart or phone.   Her options for follow up based on availability: [facility] pharmacist (our pharmacists are a valued member of our Hypertension Clinic) 11/7 or 11/21 [facility] pharmacist on multiple dates My next HTN clinic visit is 08/14/23, she could be placed on wait list.  Dr. Leonides Sake next available HTN Clinic is 10/05/23. With Dr. Cristal Deer (her general cardiologist) 09/22/23.  Alver Sorrow, NP

## 2023-06-12 NOTE — Progress Notes (Signed)
Advanced [facility]pertension Clinic Assessment:    Date:  06/12/2023   ID:  Anne [name], DOB 09/27/1945, MRN [id number]  PCP:  Delma Officer, PA  Cardiologist:  Jodelle Red, MD  Nephrologist:  Referring MD: Delma Officer, PA   CC: Hypertension  History of Present Illness:    Anne [name] is a 77 y.o. 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   Previously intolerant to statins myalgias.  She has declined alternatives including Nexletol and PCSK9.  She saw endocrinology 01/2023 for evaluation of possible hyperaldosteronism.  Plasma aldosterone 10, renin 0.57.  Unable to rule out primary hyperaldosteronism without salt loading however given hypertensive urgency it was deferred.  She was recommended to continue spironolactone 25 mg daily.  She just saw Dr. Cristal Deer 06/04/23 with BP 138/82. However on calling to discuss appointment today with nursing team it was noted she had elevated BP and headache last night for which she took nitroglycerin (no chest pain).  She was seen 06/05/23 to establish with Hypertension clinic. Diagnosed many years prior with HTN, unclear onset. Home arm/wrist cuff had been calibrated per her report. No formal exercise routine. Limited 500mg  Na intake. Did note stressors which she attributes to elevated BP. She was started on Imdur.   We reviewed that testing with endocrinology was inconclusive as unable to complete salt loading to confirm hyperaldosteronism. She was very concerned about spironolactone and dietary restrictions. Dietary restrictions were predominantly self imposed as she was restricting potassium intake, only had hyperkalemia on 50mg  dose of Spironolactone but had not recurred on 25mg  dose. Per her request, Spironolactone was discontinued.   ED visit  06/09/23 with headache, neck pain with BP 147/72 after taking Nitroglycerin at home for elevated BP. Symptoms were not felt to be related to Imdur but per patient preference she was transitioned to Clonidine 0.2mg  BID.   She presents today for follow up independently. When asked about her BP, "I have been told I suffer from white coat hypertension". She has not checked her BP at home since starting Clonidine. She takes her medications at 7AM and 7PM. She is frustrated by BP medications causing her to be hospitalized. She notes a few hours after her first dose of Imdur ashe had neck stiffness. Even the top of her head was sore to the touch per her report. She was advised by her pharmacist at [facility] to not take Imdur and Clonidine together - encouraged her if she had concerns about medications to be sure to contact our office for concerns.   Never tried: Minoxidil    Previous antihypertensives: Nebivolol - disoriented Carvedilol - hypotension Diltiazem - severe fatigue Valsartan - GI distress Hydralazine - fluid retention Metoprolol - hypotension Triamterene-HCTZ - dehydration Amlodipine - swelling Lisinopril - cough Losartan - hair loss Doxazosin - dizziness Eplerenone - hair loss Spironolactone - bothered by potassium dietary restrictions. Tolerated 25mg  but not 50mg . Per patient preference, remain off. Imdur - neck pain  Past Medical History:  Diagnosis Date   Complication of anesthesia    slow to wake up   Diverticulosis    Hiatal hernia    Hypercholesterolemia    Hypertension    Osteopenia    Type 2 diabetes mellitus with stage 3a chronic kidney disease, without long-term current use of insulin (HCC) 12/05/2021   Vitamin D deficiency     Past Surgical  History:  Procedure Laterality Date   ABDOMINAL HYSTERECTOMY     Partial   AUGMENTATION MAMMAPLASTY     BREAST BIOPSY Left 1975   BREAST BIOPSY Right 1975   CORONARY STENT INTERVENTION N/A 03/16/2020   Procedure: CORONARY STENT  INTERVENTION;  Surgeon: Kathleene Hazel, MD;  Location: [facility];  Service: Cardiovascular;  Laterality: N/A;   fibrocystic breast excision     LAPAROSCOPIC ILEOCECECTOMY  07/07/2015   LAPAROTOMY N/A 07/07/2015   Procedure: EXPLORATORY LAPAROTOMY WITH ILEOCECECTOMY ;  Surgeon: Manus Rudd, MD;  Location: [facility] OR;  Service: General;  Laterality: N/A;   LEFT HEART CATH AND CORONARY ANGIOGRAPHY N/A 03/16/2020   Procedure: LEFT HEART CATH AND CORONARY ANGIOGRAPHY;  Surgeon: Kathleene Hazel, MD;  Location: [facility] CV [facility];  Service: Cardiovascular;  Laterality: N/A;   TUBAL LIGATION      Current Medications: Current Meds  Medication Sig   acetaminophen-codeine (TYLENOL #3) 300-30 MG tablet Take 1 tablet by mouth every 4 (four) hours as needed.   alendronate (FOSAMAX) 70 MG tablet Take by mouth.   amoxicillin (AMOXIL) 500 MG capsule Take 500 mg by mouth 3 (three) times daily.   aspirin EC 81 MG tablet TAKE 1 TABLET (81 MG TOTAL) BY MOUTH DAILY. SWALLOW WHOLE.   cloNIDine (CATAPRES) 0.2 MG tablet Take 1 tablet (0.2 mg total) by mouth 2 (two) times daily.   nitroGLYCERIN (NITROSTAT) 0.4 MG SL tablet Place 1 tablet (0.4 mg total) under the tongue every 5 (five) minutes as needed for chest pain. May repeat 2 times, if 3rd dose needed call 911     Allergies:   Amiloride; Anesthetics, amide; Other; Prilocaine; Amlodipine besylate; Augmentin [amoxicillin-pot clavulanate]; Cardizem [diltiazem hcl]; Cefdinir; Diltiazem hcl; Eplerenone; Indomethacin; Naproxen; Tenex [guanfacine hcl]; Triamterene-hctz; Cephalexin; Hydralazine hcl; Imdur [isosorbide nitrate]; Nebivolol hcl; Potassium chloride; Hytrin [terazosin hcl]; and Lisinopril   Social History   Socioeconomic History   Marital status: Married    Spouse name: Not on file   Number of children: 2   Years of education: Not on file   Highest education level: Not on file  Occupational History   Occupation: retired  Tobacco  Use   Smoking status: Never   Smokeless tobacco: Never  Vaping Use   Vaping status: Never Used  Substance and Sexual Activity   Alcohol use: Yes    Comment: 3 times a year   Drug use: No   Sexual activity: Yes    Birth control/protection: Surgical  Other Topics Concern   Not on file  Social History Narrative   Not on file   Social Determinants of Health   Financial Resource Strain: Not on file  Food Insecurity: Not on file  Transportation Needs: Not on file  Physical Activity: Not on file  Stress: Not on file  Social Connections: Unknown (01/17/2022)   [facility]   Social Network    Social Network: Not on file     Family History: The patient's family history includes Hypertension in her mother; Stroke in her brother and mother. There is no history of Cancer, Alcohol abuse, Early death, Hearing loss, Heart disease, Hyperlipidemia, or Kidney disease.  ROS:   Please see the history of present illness.     All other systems reviewed and are negative.  EKGs/Labs/Other Studies Reviewed:         Recent Labs: 05/28/2023: Magnesium 2.2 06/09/2023: BUN 13; Creatinine, Ser 0.95; Hemoglobin 11.7; Platelets 169; Potassium 4.7; Sodium 138  Recent Lipid Panel    Component Value Date/Time   CHOL 164 12/05/2021 0211   CHOL 206 (H) 03/28/2021 0844   TRIG 31 12/05/2021 0211   HDL 60 12/05/2021 0211   HDL 78 03/28/2021 0844   CHOLHDL 2.7 12/05/2021 0211   VLDL 6 12/05/2021 0211   LDLCALC 98 12/05/2021 0211   LDLCALC 118 (H) 03/28/2021 0844   LDLDIRECT 158.7 09/30/2013 1255    Physical Exam:   VS:  BP (!) 158/87 (BP Location: Left Arm, Patient Position: Standing, Cuff Size: Normal)   Ht 5\' 4"  (1.626 m)   Wt 131 lb (59.4 kg)   SpO2 98%   BMI 22.49 kg/m  , BMI Body mass index is 22.49 kg/m. GENERAL:  Well appearing HEENT: Pupils equal round and reactive, fundi not visualized, oral mucosa unremarkable NECK:  No jugular venous distention,  waveform within normal limits, carotid upstroke brisk and symmetric, no bruits, no thyromegaly LYMPHATICS:  No cervical adenopathy LUNGS:  Clear to auscultation bilaterally HEART:  RRR.  PMI not displaced or sustained,S1 and S2 within normal limits, no S3, no S4, no clicks, no rubs, no murmurs ABD:  Flat, positive bowel sounds normal in frequency in pitch, no bruits, no rebound, no guarding, no midline pulsatile mass, no hepatomegaly, no splenomegaly EXT:  2 plus pulses throughout, no edema, no cyanosis no clubbing SKIN:  No rashes no nodules NEURO:  Cranial nerves II through XII grossly intact, motor grossly intact throughout PSYCH:  Cognitively intact, oriented to person place and time  ASSESSMENT/PLAN:    HTN / Possible hyperaldosteronism -  BP not at goal <130/80. Prior intolerance to many medications detailed above.  ED visit 06/09/23 had neck pain and Imdur was stopped. Clonidine 0.2mg  BID was initiated. As started <1 week ago will continue same and check in MyChart message. If BP not at goal, could consider increasing Clonidine dose - she is interested in the clonidine patch for future use. She has not been checking BP at home since that time. Encouraged to resume checking daily. Check in via MyChart message Monday. If BP at goal <130/80, transition to Clonidine 0.2mg  weekly patch. If BP not at goal <130/80, change to Clonidine 0.3mg  weekly patch. 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 Scheduled 06/30/23.  ?Hyperaldosteronism -previously worked up by endocrinology.  Labs indeterminate for hyperaldosteronism but unable to complete salt loading to confirm diagnosis due to markedly elevated blood pressure.   Previously on spironolactone but she was bothered by dietary restrictions and does not want to resume. No hypokalemia since discontinuation of Spironolactone.   CAD / HLD, LDL goal <70 - Stable with no anginal symptoms. No indication for ischemic evaluation.  Previous myalgia with statin. Lipids managed with diet/exercise per her preference. Previously declined PCSK9i, Nexletol. She was given permission to resume Aspirin, she had self stopped due to concerns about indications.   Screening for Secondary Hypertension:     06/05/2023    4:19 PM  Causes  Renovascular HTN Screened     - Comments 05/2023 renal duplex ordered  Thyroid Disease Screened     - Comments 09/2022 normal TSH  Hyperaldosteronism Screened     - Comments 02/2023 aldosterone 10 (indeterminite for hyperaldosteronism) with endocrinology while off Spironolactone. Unable to complete salt loading  to confirm diagnosis due to markedly elevated BP.  Pheochromocytoma Screened     - Comments 09/2021 normal adrenal glands  Cushing's Syndrome N/A     - Comments non cushinoid appearance  Coarctation of the Aorta N/A     - Comments BP symmetrical. 2021 CT unremarkable aorta  Compliance Screened    Relevant Labs/Studies:    Latest Ref Rng & Units 06/09/2023   10:43 AM 05/28/2023    3:36 PM 02/25/2023    8:36 AM  Basic Labs  Sodium 135 - 145 mmol/L 138  135  139   Potassium 3.5 - 5.1 mmol/L 4.7  4.0  3.5   Creatinine 0.44 - 1.00 mg/dL 0.98  1.19  1.47        Latest Ref Rng & Units 03/01/2021    4:29 PM 12/21/2019    8:54 AM  Thyroid   TSH 0.450 - 4.500 uIU/mL 1.960  2.747        Latest Ref Rng & Units 02/25/2023    8:36 AM 01/14/2023   11:40 AM 03/16/2020    5:17 AM  Renin/Aldosterone   Aldosterone --  ng/dL 10  8  82.9        Latest Ref Rng & Units 01/14/2023   11:40 AM  Metanephrines/Catecholamines   Metanephrines <=57 pg/mL 25   Normetanephrines  <=148 pg/mL 200        Latest Ref Rng & Units 01/14/2023   11:40 AM 12/05/2021     4:28 PM  Cortisol  Cortisol  ug/dL 7.6  3.8        5/62/1308    2:39 PM  Renovascular   Renal Artery [place] Completed Yes     Disposition:    FU with MD/PharmD in 2-3 months    Medication Adjustments/Labs and Tests Ordered: Current medicines are reviewed at length with the patient today.  Concerns regarding medicines are outlined above.  No orders of the defined types were placed in this encounter.  No orders of the defined types were placed in this encounter.    Signed, Alver Sorrow, NP  06/12/2023 2:19 PM    [facility]

## 2023-06-12 NOTE — Patient Instructions (Signed)
Medication Instructions:  Continue your current medications    Follow-Up: follow up in 2-3 months with Hypertension Clinic    Special Instructions:   Tips to Measure your Blood Pressure Correctly  Check blood pressure once per day at least an hour after your medications  Here's what you can do to ensure a correct reading:  Don't drink a caffeinated beverage or smoke during the 30 minutes before the test.  Sit quietly for five minutes before the test begins.  During the measurement, sit in a chair with your feet on the floor and your arm supported so your elbow is at about heart level.  The inflatable part of the cuff should completely cover at least 80% of your upper arm, and the cuff should be placed on bare skin, not over a shirt.  Don't talk during the measurement.   Blood pressure categories  Blood pressure category SYSTOLIC (upper number)  DIASTOLIC (lower number)  Normal Less than 120 mm Hg and Less than 80 mm Hg  Elevated 120-129 mm Hg and Less than 80 mm Hg  High blood pressure: Stage 1 hypertension 130-139 mm Hg or 80-89 mm Hg  High blood pressure: Stage 2 hypertension 140 mm Hg or higher or 90 mm Hg or higher  Hypertensive crisis (consult your doctor immediately) Higher than 180 mm Hg and/or Higher than 120 mm Hg  Source: American Heart Association and American Stroke Association. For more on getting your blood pressure under control, buy Controlling Your Blood Pressure, a Special Health Report from [facility].   Blood Pressure Log   Date   Time  Blood Pressure  Example: Nov 1 9 AM 124/78

## 2023-06-13 ENCOUNTER — Telehealth (HOSPITAL_BASED_OUTPATIENT_CLINIC_OR_DEPARTMENT_OTHER): Payer: Self-pay | Admitting: Family

## 2023-06-13 NOTE — Telephone Encounter (Signed)
Returned call to patient to discuss follow up concerns, no answer, left message to call back.

## 2023-06-13 NOTE — Telephone Encounter (Signed)
LVM stating that I would like to speak with her about recent interactions with our team. I stated that I will call her back before 5p today.

## 2023-06-13 NOTE — Telephone Encounter (Signed)
Returned call to patient and spent over 10 minutes on the phone trying to arrange follow up, patient states she feels like she is being ignored. Have repeatedly offered patient multiple follow up options or manager communication. Advised patient we will call her on Thursday 10/11 to follow up on her blood pressures, no further plan has been arranged at this time.      "I am sorry to hear she is frustrated by her follow up options. Unfortunately we have limited availability. Fortunately she does have BP cuff for home monitoring and we are available via MyChart or phone.    Her options for follow up based on availability: [facility] pharmacist (our pharmacists are a valued member of our Hypertension Clinic) 11/7 or 11/21 [facility] pharmacist on multiple dates My next HTN clinic visit is 08/14/23, she could be placed on wait list.  Dr. Leonides Sake next available HTN Clinic is 10/05/23. With Dr. Cristal Deer (her general cardiologist) 09/22/23.   Alver Sorrow, NP"

## 2023-06-13 NOTE — Telephone Encounter (Signed)
Patient is returning

## 2023-06-13 NOTE — Telephone Encounter (Signed)
Attempted to contact patient 10/4 at 0846am to discuss scheduling. No answer left message for patient to call back.

## 2023-06-13 NOTE — Telephone Encounter (Signed)
Called patient to schedule her follow up with Dan Humphreys.  Patient started going off on me because the medication she was taking almost killed her she stated. Patient feel that she is not feeling heard and it should take 2-3 months for her to be seen. Patient stated that she was recently in the ER and that her blood pressure was 203/ something. Patient was yelling that her husband and family are very upset about the medication she taking. She feel if she can't be helped with her blood pressure then she may have to go somewhere else. Patient hung up because she started to cry because she feel that know one is hearing her and she is not ready to die.   Could someone please call her and discuss her medication with her.

## 2023-06-16 NOTE — Telephone Encounter (Signed)
See updated phone note 06/13/23.  Alver Sorrow, NP

## 2023-06-18 DIAGNOSIS — I10 Essential (primary) hypertension: Secondary | ICD-10-CM | POA: Diagnosis not present

## 2023-06-18 NOTE — Telephone Encounter (Addendum)
Late Entry:  Anne [name] and I spoke for approximately an hour at the end of the day on Friday, 06/13/23.   She was kind and forthcoming with details about her perspective of recent interactions with our team; I will follow up with individual team members separately to share more details. Anne [name] would like to continue to seek cardiac care with our [facility] team.  Anne [name] asked for a better explanation of how our many team members, including staff and providers, work together to care for her. I shared with her how our Team Based Care model supports optimal patient health and Anne [name] then agreed to schedule a 1 month follow up with Pharm D in November.   Anne [name] is prepared to warmly receive our team's 1 week f/u phone call on Thursday, 06/19/23.   Anne [name] ask me to confirm that she is still scheduled for a renal artery duplex on 06/30/23, which I did. She requested we send her a MyChart message if there are any instructions for prep.  (I believe Serena Croissant will be conducting the follow up call tomorrow, however, I am routing to our triage team incase Rutherford Guys is out of the office.)

## 2023-06-19 MED ORDER — CLONIDINE 0.3 MG/24HR TD PTWK: 0.3000 mg | MEDICATED_PATCH | TRANSDERMAL | 12 refills | Status: DC

## 2023-06-19 NOTE — Telephone Encounter (Signed)
Average BP 142/83. Recommend stop Clonidine 0.2mg  BID. Start Clonidine 0.3mg  patch applied weekly. We had discussed transition to patch in our recent clinic visit and she was interested. This is also at a slightly higher dose so it will help to lower her blood pressure further. She should only wear the patch and NOT also take the tablets. It may take the pharmacy a day or two to have the patch available, she should continue Clonidine 0.2mg  BID until new patch available.   Alver Sorrow, NP

## 2023-06-19 NOTE — Telephone Encounter (Signed)
Returned call to patient to follow up on Blood pressures.   She states that her blood pressure has been going up and down. She states she has been getting woken up at night. She states she is trying to take the medications 12 hours apart. She states she has been calling the pharmacy and [facility] to ask if she can take extra pills due to the blood pressure rising. She states when she lays down at night her pressure gets high and her heart rate gets low. She states in the afternoon she starts to feel poorly before she can take another dose. She states she can feel when her blood pressure is high and she will check her pressure, she can feel this even when she is laying down sleeping.   113/75 73 166/92 131/74 (am)  120/82 144/85 138/80 180/93  Hr running 59-74   She states she did get into her mychart and she is able to see her appointments now

## 2023-06-19 NOTE — Addendum Note (Signed)
Addended by: Marlene Lard on: 06/19/2023 01:21 PM   Modules accepted: Orders

## 2023-06-19 NOTE — Telephone Encounter (Signed)
Returned call to, reviewed recommendations with patient. Rx to patient's pharmacy.        "Average BP 142/83. Recommend stop Clonidine 0.2mg  BID. Start Clonidine 0.3mg  patch applied weekly. We had discussed transition to patch in our recent clinic visit and she was interested. This is also at a slightly higher dose so it will help to lower her blood pressure further. She should only wear the patch and NOT also take the tablets. It may take the pharmacy a day or two to have the patch available, she should continue Clonidine 0.2mg  BID until new patch available.    Alver Sorrow, NP"

## 2023-06-22 ENCOUNTER — Other Ambulatory Visit: Payer: Self-pay | Admitting: Cardiology

## 2023-06-24 DIAGNOSIS — Z789 Other specified health status: Secondary | ICD-10-CM | POA: Insufficient documentation

## 2023-06-25 ENCOUNTER — Telehealth (HOSPITAL_BASED_OUTPATIENT_CLINIC_OR_DEPARTMENT_OTHER): Payer: Self-pay | Admitting: Cardiology

## 2023-06-25 NOTE — Telephone Encounter (Signed)
Blood pressure is 189/99. pluse was 86. she is here and want to know if someone can check her patch. she is dizzy

## 2023-06-25 NOTE — Telephone Encounter (Signed)
Thanks for helping her. Would recommend using the patch at least 3-5 days prior to determining efficacy. Likely had not bene in her system fully. Her stress regarding her BP certainly does not help readings.   Alver Sorrow, NP

## 2023-06-25 NOTE — Telephone Encounter (Signed)
Spoke with patient regarding placement of Clonidine patch and elevated blood pressure  She just started the patch today. Blood pressure readings 180's/90's on home machine.  Discussed with Marisue Humble D via secure chat, comments below   He did review her chart and secondary to intolerances not changes made.   Apply patch to hairless area of intact skin on upper outer arm or chest; rotate patch location [ Using clonidine patches and oral tablets together can lead to an increased risk of adverse effects such as excessive sedation and somnolence, and careful monitoring is required.  Clonidine is available in both patch and oral forms, which can be used concurrently under careful medical supervision to manage conditions like hypertension or withdrawal symptoms in opioid dependence.[1]  The transdermal patch has a slow onset, taking 2 to 3 days to achieve therapeutic levels, while oral tablets act more quickly.[1]  When using both forms together, it's crucial to monitor for signs of excessive sedation and adjust dosages accordingly to avoid adverse effects.[1]  The combination might be considered in scenarios where steady state and acute management of symptoms are both required, such as in drug withdrawal management.[1] so I dont recommend it   Reviewed recommendations and ED precautions with patient, verbalized understanding.   Advised to call back if no improvement in blood pressure  Did apologize for delay in getting her needs addressed, explained clinic was very busy today. She was appreciative of time spent with her.

## 2023-06-30 ENCOUNTER — Ambulatory Visit (INDEPENDENT_AMBULATORY_CARE_PROVIDER_SITE_OTHER): Payer: PPO

## 2023-06-30 DIAGNOSIS — I1A0 Resistant hypertension: Secondary | ICD-10-CM

## 2023-07-01 ENCOUNTER — Encounter: Payer: Self-pay | Admitting: Pharmacist

## 2023-07-01 ENCOUNTER — Ambulatory Visit: Payer: PPO | Attending: Cardiology | Admitting: Pharmacist

## 2023-07-01 VITALS — BP 161/75

## 2023-07-01 DIAGNOSIS — I10 Essential (primary) hypertension: Secondary | ICD-10-CM | POA: Diagnosis not present

## 2023-07-01 NOTE — Progress Notes (Signed)
Patient ID: Anne [name]                 DOB: 07/17/1946                      MRN: [id number]     HPI: Anne [name] is a 77 y.o. female referred by Gillian Shields to HTN clinic. PMH is significant for HTN, NSTEMI, CHF, CKD, and numerous medication intolerances.   Patient started on transdermal clonidine patch 0.3mg  last week. Unsure if she applied it correctly.  Patient presents today with her home BP cuff and clonidine patches. Has it applied on her upper chest on left side. Has protective covering over it.  Patient has not checked BP today. Had her check using her home cuff in room. Home cuff: 162/82.   Current HTN meds:  Clonidine patch 0.3mg    Wt Readings from Last 3 Encounters:  06/12/23 131 lb (59.4 kg)  06/05/23 130 lb 1.6 oz (59 kg)  06/04/23 127 lb (57.6 kg)   BP Readings from Last 3 Encounters:  07/01/23 (!) 161/75  06/12/23 (!) 152/84  06/09/23 (!) 147/72   Pulse Readings from Last 3 Encounters:  06/09/23 (!) 58  06/05/23 82  06/04/23 88    Renal function: CrCl cannot be calculated (Patient's most recent lab result is older than the maximum 21 days allowed.).  Past Medical History:  Diagnosis Date   Complication of anesthesia    slow to wake up   Diverticulosis    Hiatal hernia    Hypercholesterolemia    Hypertension    Osteopenia    Type 2 diabetes mellitus with stage 3a chronic kidney disease, without long-term current use of insulin (HCC) 12/05/2021   Vitamin D deficiency     Current Outpatient Medications on File Prior to Visit  Medication Sig Dispense Refill   acetaminophen-codeine (TYLENOL #3) 300-30 MG tablet Take 1 tablet by mouth every 4 (four) hours as needed.     alendronate (FOSAMAX) 70 MG tablet Take by mouth.     amoxicillin (AMOXIL) 500 MG capsule Take 500 mg by mouth 3 (three) times daily.     aspirin EC 81 MG tablet TAKE 1 TABLET (81 MG TOTAL) BY MOUTH DAILY. SWALLOW WHOLE. 90 tablet 2   cloNIDine (CATAPRES - DOSED IN MG/24 HR)  0.3 mg/24hr patch Place 1 patch (0.3 mg total) onto the skin once a week. 4 patch 12   nitroGLYCERIN (NITROSTAT) 0.4 MG SL tablet Place 1 tablet (0.4 mg total) under the tongue every 5 (five) minutes as needed for chest pain. May repeat 2 times, if 3rd dose needed call 911 25 tablet 1   No current facility-administered medications on file prior to visit.    Allergies  Allergen Reactions   Amiloride Other (See Comments)    Hair loss   Anesthetics, Amide Other (See Comments)    Lethargic and very slow reanimation lasting a full day or so   Other Other (See Comments)    Very hard to awake from anaesthetic with surgery. Acute renal failure, hypokalemia Very hard to awake from anaesthetic with surgery.   Prilocaine Other (See Comments)    Lethargic and very slow reanimation lasting a full day or so   Amlodipine Besylate Swelling    Severe edema / Headaches   Augmentin [Amoxicillin-Pot Clavulanate] Other (See Comments)    Pain in hips and legs   Cardizem [Diltiazem Hcl] Nausea And Vomiting    Hospitalized when it  occurred   Cefdinir Nausea And Vomiting   Diltiazem Hcl Nausea And Vomiting    Hospitalized when it occurred    Eplerenone Other (See Comments)    Hair loss   Indomethacin Other (See Comments)    esophagitis   Naproxen Other (See Comments)    Gastric intolerance   Tenex [Guanfacine Hcl] Other (See Comments)    Chronic insomnia    Triamterene-Hctz Other (See Comments)    Acute renal failure, hypokalemia    Cephalexin Other (See Comments)   Hydralazine Hcl Other (See Comments)    Fluid retention   Imdur [Isosorbide Nitrate]     Elevated blood pressure, neck cramps, headache, dizziness, blurred vision    Nebivolol Hcl     Other reaction(s): disoriented   Potassium Chloride Other (See Comments)    Severe hair loss, baldness   Hytrin [Terazosin Hcl] Other (See Comments)    Pt states she can not take this med but can not relate any side effect or incident that occurred    Lisinopril Other (See Comments)    Unknown, pt says she "can't take it", cough/hypotension and thirst     Assessment/Plan:  1. Hypertension -  HYPERTENSION CONTROL Vitals:   07/01/23 1441 07/01/23 1501  BP: (!) 157/77 (!) 161/75    The patient's blood pressure is elevated above target today.  In order to address the patient's elevated BP: Blood pressure will be monitored at home to determine if medication changes need to be made.; A referral to the PharmD Hypertension Clinic will be placed.    Patient BP in room 157/77 which remains above goal but is improving. Patient has applied patch correctly to upper chest. Showed patient other sites such as outside of arms, upper back, and under arms on chest that would be appropriate. Gave patient blood pressure sheets to log home readings because she plans on returning for PharmD visit on 11/7 at [facility].  Continue clonidine 0.3mg  patches x 7 days  Laural Golden, PharmD, BCACP, CDCES, CPP [address], [address] [place], [place], [address] Phone: [phone], Fax: [phone]    Patient BP in room 157/77

## 2023-07-01 NOTE — Patient Instructions (Addendum)
Your blood pressure is improving and your patch is applied correctly  Tomorrow morning, reapply a new patch do a different side of your body  Continue to check your blood pressure at home and write it on your log sheet  Laural Golden, PharmD, BCACP, CDCES, CPP [address] [place], [place], [address] Phone: [phone], Fax: [phone]

## 2023-07-16 NOTE — Progress Notes (Signed)
Office Visit    Patient Name: KENNETHIA LYKES Date of Encounter: 07/17/2023  Primary Care Provider:  Delma Officer, PA Primary Cardiologist:  Jodelle Red, MD  Chief Complaint    Hypertension  Significant Past Medical History   CAD NSTEMI s/p PCI to pLAD  HLD 3/23 LDL 98 no medications  CKD 9/24 GFR > 60, was at 56 last month  hyperaldosteronism Presumed - on spironolactone       Allergies  Allergen Reactions   Amiloride Other (See Comments)    Hair loss   Anesthetics, Amide Other (See Comments)    Lethargic and very slow reanimation lasting a full day or so   Other Other (See Comments)    Very hard to awake from anaesthetic with surgery. Acute renal failure, hypokalemia Very hard to awake from anaesthetic with surgery.   Prilocaine Other (See Comments)    Lethargic and very slow reanimation lasting a full day or so   Amlodipine Besylate Swelling    Severe edema / Headaches   Augmentin [Amoxicillin-Pot Clavulanate] Other (See Comments)    Pain in hips and legs   Cardizem [Diltiazem Hcl] Nausea And Vomiting    Hospitalized when it occurred   Cefdinir Nausea And Vomiting   Diltiazem Hcl Nausea And Vomiting    Hospitalized when it occurred    Eplerenone Other (See Comments)    Hair loss   Indomethacin Other (See Comments)    esophagitis   Naproxen Other (See Comments)    Gastric intolerance   Tenex [Guanfacine Hcl] Other (See Comments)    Chronic insomnia    Triamterene-Hctz Other (See Comments)    Acute renal failure, hypokalemia    Cephalexin Other (See Comments)   Hydralazine Hcl Other (See Comments)    Fluid retention   Imdur [Isosorbide Nitrate]     Elevated blood pressure, neck cramps, headache, dizziness, blurred vision    Nebivolol Hcl     Other reaction(s): disoriented   Potassium Chloride Other (See Comments)    Severe hair loss, baldness   Hytrin [Terazosin Hcl] Other (See Comments)    Pt states she can not take this med but  can not relate any side effect or incident that occurred   Lisinopril Other (See Comments)    Unknown, pt says she "can't take it", cough/hypotension and thirst    History of Present Illness    MIANA LENSCH is a 77 y.o. female patient of Dr Cristal Deer, in the office today for hypertension follow up.  She was seen by Gillian Shields in early October, with a BP of 152/84.   She has had multiple medication intolerances and was told to increase clonidine patch from 0.2 to 0.3 mg.  She then saw Laural Golden PharmD 2 weeks later and pressure was still elevated.  She was asked to monitor home readings and bring log and home device to follow up.  Today she is in the office for follow up.  States that she uses the SL NTG for elevated BP readings, but has not used recently.  She has tolerated the clonidine patches well and notes that her blood pressure has recently started to drop with the increase to 0.3 mg  Blood Pressure Goal:  130/80  Current Medications:  clonidine 0.3 mg qw  Previously tried:   Nebivolol - disoriented Carvedilol - hypotension Diltiazem - severe fatigue Valsartan - GI distress Hydralazine - fluid retention Metoprolol - hypotension Triamterene-HCTZ - dehydration Amlodipine - swelling Lisinopril - cough Losartan -  hair loss Doxazosin - dizziness Eplerenone - hair loss Spironolactone - bothered by potassium dietary restrictions. Tolerated 25mg  but not 50mg . Per patient preference, remain off. Imdur - neck pain  Family Hx: mother had htn, stroke, one brother had stroke    Social Hx:      Tobacco: no  Alcohol: no - only 1-2 per year  Caffeine: no coffee, no caffeine  Home BP readings:  readings this month at home average 129/80 and home device found to be accurate within 10 points   Accessory Clinical Findings    Lab Results  Component Value Date   CREATININE 0.95 06/09/2023   BUN 13 06/09/2023   NA 138 06/09/2023   K 4.7 06/09/2023   CL 106 06/09/2023    CO2 25 06/09/2023   Lab Results  Component Value Date   ALT 12 12/07/2021   AST 19 12/07/2021   ALKPHOS 46 12/07/2021   BILITOT 1.1 12/07/2021   Lab Results  Component Value Date   HGBA1C 5.9 (H) 12/05/2021    Home Medications    Current Outpatient Medications  Medication Sig Dispense Refill   alendronate (FOSAMAX) 70 MG tablet Take by mouth.     aspirin EC 81 MG tablet TAKE 1 TABLET (81 MG TOTAL) BY MOUTH DAILY. SWALLOW WHOLE. 90 tablet 2   cloNIDine (CATAPRES - DOSED IN MG/24 HR) 0.3 mg/24hr patch Place 1 patch (0.3 mg total) onto the skin once a week. 4 patch 12   Cod Liver Oil OIL Take 5 mLs by mouth daily.     Multiple Vitamin (MULTIVITAMIN) LIQD Take 5 mLs by mouth daily.     nitroGLYCERIN (NITROSTAT) 0.4 MG SL tablet Place 1 tablet (0.4 mg total) under the tongue every 5 (five) minutes as needed for chest pain. May repeat 2 times, if 3rd dose needed call 911 25 tablet 1   No current facility-administered medications for this visit.     HYPERTENSION CONTROL Vitals:   07/17/23 0838 07/17/23 0844  BP: (!) 166/88 (!) 165/94    The patient's blood pressure is elevated above target today.  In order to address the patient's elevated BP:       Assessment & Plan    HTN (hypertension) Assessment: BP is uncontrolled in office BP 166/88 mmHg;  above the goal (<130/80). Home monitors (wrist and arm) both read within 10 points of office device Home readings much lower than in office, in the past week average was 129/80 Tolerates clonidine patch well without any side effects Reiterated the importance of regular exercise and low salt diet   Plan:  Continue taking  Patient to keep record of BP readings with heart rate and report to [place] at the next visit Patient to follow up with Dr. Cristal Deer in 2025 as scheduled  Labs ordered today:  none   Phillips Hay PharmD CPP [facility]  [address] [place], [place] [address] [phone]

## 2023-07-17 ENCOUNTER — Ambulatory Visit (HOSPITAL_BASED_OUTPATIENT_CLINIC_OR_DEPARTMENT_OTHER): Payer: PPO | Admitting: Pharmacist Clinician (PhC)/ Clinical Pharmacy Specialist

## 2023-07-17 ENCOUNTER — Encounter (HOSPITAL_BASED_OUTPATIENT_CLINIC_OR_DEPARTMENT_OTHER): Payer: Self-pay | Admitting: Pharmacist Clinician (PhC)/ Clinical Pharmacy Specialist

## 2023-07-17 VITALS — BP 165/94 | HR 80

## 2023-07-17 DIAGNOSIS — I10 Essential (primary) hypertension: Secondary | ICD-10-CM | POA: Diagnosis not present

## 2023-07-17 NOTE — Patient Instructions (Signed)
Follow up appointment: with Dr. Cristal Deer next September  Take your BP meds as follows: continue with your clonidine patches  Check your blood pressure at home about 3-4 times per week and keep record of the readings.  Hypertension "High blood pressure"  Hypertension is often called "The Silent Killer." It rarely causes symptoms until it is extremely  high or has done damage to other organs in the body. For this reason, you should have your  blood pressure checked regularly by your physician. We will check your blood pressure  every time you see a provider at one of our offices.   Your blood pressure reading consists of two numbers. Ideally, blood pressure should be  below 120/80. The first ("top") number is called the systolic pressure. It measures the  pressure in your arteries as your heart beats. The second ("bottom") number is called the diastolic pressure. It measures the pressure in your arteries as the heart relaxes between beats.  The benefits of getting your blood pressure under control are enormous. A 10-point  reduction in systolic blood pressure can reduce your risk of stroke by 27% and heart failure by 28%  Your blood pressure goal is <130/80  To check your pressure at home you will need to:  1. Sit up in a chair, with feet flat on the floor and back supported. Do not cross your ankles or legs. 2. Rest your left arm so that the cuff is about heart level. If the cuff goes on your upper arm,  then just relax the arm on the table, arm of the chair or your lap. If you have a wrist cuff, we  suggest relaxing your wrist against your chest (think of it as Pledging the Flag with the  wrong arm).  3. Place the cuff snugly around your arm, about 1 inch above the crook of your elbow. The  cords should be inside the groove of your elbow.  4. Sit quietly, with the cuff in place, for about 5 minutes. After that 5 minutes press the power  button to start a reading. 5. Do not  talk or move while the reading is taking place.  6. Record your readings on a sheet of paper. Although most cuffs have a memory, it is often  easier to see a pattern developing when the numbers are all in front of you.  7. You can repeat the reading after 1-3 minutes if it is recommended  Make sure your bladder is empty and you have not had caffeine or tobacco within the last 30 min  Always bring your blood pressure log with you to your appointments. If you have not brought your monitor in to be double checked for accuracy, please bring it to your next appointment.  You can find a list of quality blood pressure cuffs at validatebp.org

## 2023-07-17 NOTE — Assessment & Plan Note (Signed)
Assessment: BP is uncontrolled in office BP 166/88 mmHg;  above the goal (<130/80). Home monitors (wrist and arm) both read within 10 points of office device Home readings much lower than in office, in the past week average was 129/80 Tolerates clonidine patch well without any side effects Reiterated the importance of regular exercise and low salt diet   Plan:  Continue taking  Patient to keep record of BP readings with heart rate and report to [place] at the next visit Patient to follow up with Dr. Cristal Deer in 2025 as scheduled  Labs ordered today:  none

## 2023-08-11 ENCOUNTER — Telehealth: Payer: Self-pay | Admitting: Family

## 2023-08-11 MED ORDER — TRIAMTERENE-HCTZ 37.5-25 MG PO TABS: 1.0000 | ORAL_TABLET | Freq: Every day | ORAL | 3 refills | Status: DC

## 2023-08-11 NOTE — Telephone Encounter (Signed)
Pt returning call

## 2023-08-11 NOTE — Telephone Encounter (Signed)
Spoke with patient.  States patch placed on Nov 20 was fine for about 3-4 days then started itching and burning.  Took it of last Wednesday and put new patch on other arm. Again did well for 3-4 days, now having same reaction.    Patient has long list of medication intolerances - making it difficult to find meds to get her BP closer to goal.   Reviewed some of her prior reactions, and she is willing to try triamterene/hctz again - states she took it for several years before had what was probably an acute episode of dehydration.   Because she is on clonidine 0.3 mg patches, will have her taper off (she still has 0.2 mg tablets at home).  Will do 0.2 mg bid x4d, then 0.1 mg bid x 4 days then 0.1 mg at bedtime x 4 days.  She should start the triamterene/hctz today.  Patient voiced understanding.

## 2023-08-11 NOTE — Telephone Encounter (Addendum)
Patient called in regarding her blood pressure  Per patient her blood pressure has been still all over the place plus feels she is having an allergic reaction to her Clonidine patch  She changes patch out every Wednesday, she is having itching and burning  SBP has been as high as 200 She is very concerned with her blood pressure and does not want to go back to ED stating they do not do anything/do not prescribe medications  Patient would like to speak with Roxine Caddy D who she saw last  Will forward for review

## 2023-08-11 NOTE — Telephone Encounter (Signed)
Pt c/o medication issue:  1. Name of Medication:   cloNIDine (CATAPRES - DOSED IN MG/24 HR) 0.3 mg/24hr patch    2. How are you currently taking this medication (dosage and times per day)?  Place 1 patch (0.3 mg total) onto the skin once a week.       3. Are you having a reaction (difficulty breathing--STAT)? No  4. What is your medication issue? Pt is requesting a callback regarding her having a reaction to this medication. She stated she been having some itching, burning, swelling and dizziness but stated she'd not going to the ED due to them not being able to help her with her BP since they told her that can't prescribe her anything. Pt is wanting to speak with nurse or pharmacist as soon as possible to discuss further. Please advise.   Pt c/o BP issue: STAT if pt c/o blurred vision, one-sided weakness or slurred speech  1. What are your last 5 BP readings? 181/105  2. Are you having any other symptoms (ex. Dizziness, headache, blurred vision, passed out)? Dizziness  3. What is your BP issue? Pt stated she's having a reaction to this medication patch. Please advise

## 2023-08-13 ENCOUNTER — Telehealth: Payer: Self-pay | Admitting: Family

## 2023-08-13 NOTE — Telephone Encounter (Signed)
Pt c/o BP issue: STAT if pt c/o blurred vision, one-sided weakness or slurred speech  1. What are your last 5 BP readings? 83/56  2. Are you having any other symptoms (ex. Dizziness, headache, blurred vision, passed out)? Dizziness and feeling like going to pass out and very weak.   3. What is your BP issue? Pt is very concerned about her BP dropping. Pt called yesterday about BP being higher and medication issue but since the changes she stated something has gone wrong. (Refer to 08/12/23 phone note). Please advise

## 2023-08-13 NOTE — Telephone Encounter (Signed)
Spoke with patient initially when she called in and couple times since then.   This am at 8 took her Maxzide, 9:30 am took Clonidine 0.2 mg  Blood pressure readings 108/59, 100/56 and then 83/56  Was feeling weak and dizzy Yesterday 8 am took her Maxzide, one hour later took her Clonidine 0.2 mg  1:52 am blood pressure 181/105 realized she had forgotten her second dose of Clonidine so she took then At 12:35 her blood pressure had come up to 117/67 HR 75, felt some better Advised patient would call her back in couple of hours.  Called and spoke with her at 2:50 pm bowel obstruction 98/62 HR 60's, still having some dizziness Advised her ok to eat something a little salty, only a little   Discussed with Dr Duke Salvia and will have her hold her evening dose of the Clonidine and start tomorrow taking the Clonidine 0.1 mg twice a day for few days and then go by Roxine Caddy D's recommendations  Patient verbalized understanding She will call back if any further issues

## 2023-08-18 ENCOUNTER — Telehealth: Payer: Self-pay | Admitting: Cardiology

## 2023-08-18 NOTE — Telephone Encounter (Signed)
Called patient and made her aware that PharmD has no available appointments until January 2025 and as soon as appointments are available, APP can reach out to schedule. Patient refused and requested Belenda Cruise to give her call at her earliest convenience regarding medications.   Will route to [place].

## 2023-08-18 NOTE — Telephone Encounter (Signed)
Pt is calling to make an appt with the pharmacist

## 2023-08-26 DIAGNOSIS — Z713 Dietary counseling and surveillance: Secondary | ICD-10-CM | POA: Diagnosis not present

## 2023-08-26 DIAGNOSIS — E7800 Pure hypercholesterolemia, unspecified: Secondary | ICD-10-CM | POA: Diagnosis not present

## 2023-08-26 DIAGNOSIS — I10 Essential (primary) hypertension: Secondary | ICD-10-CM | POA: Diagnosis not present

## 2023-08-28 DIAGNOSIS — I10 Essential (primary) hypertension: Secondary | ICD-10-CM | POA: Diagnosis not present

## 2023-08-28 DIAGNOSIS — R42 Dizziness and giddiness: Secondary | ICD-10-CM | POA: Diagnosis not present

## 2023-08-28 DIAGNOSIS — R413 Other amnesia: Secondary | ICD-10-CM | POA: Diagnosis not present

## 2023-08-28 DIAGNOSIS — R634 Abnormal weight loss: Secondary | ICD-10-CM | POA: Diagnosis not present

## 2023-09-01 DIAGNOSIS — R634 Abnormal weight loss: Secondary | ICD-10-CM | POA: Diagnosis not present

## 2023-09-21 NOTE — Patient Instructions (Signed)
 Follow up appointment: 3 months - we will reach out to you for an appointment closer to that time  Take your BP meds as follows:  continue with triamterene /hctz 37.5/25 mg daily  Increase physical activity as tolerated - walking daily as you are able  Check your blood pressure at home daily (if able) and keep record of the readings.  Look at [facility] or [facility]) brands for Vitamin D   Your blood pressure goal is < 130/80  To check your pressure at home you will need to:  1. Sit up in a chair, with feet flat on the floor and back supported. Do not cross your ankles or legs. 2. Rest your left arm so that the cuff is about heart level. If the cuff goes on your upper arm,  then just relax the arm on the table, arm of the chair or your lap. If you have a wrist cuff, we  suggest relaxing your wrist against your chest (think of it as Pledging the Flag with the  wrong arm).  3. Place the cuff snugly around your arm, about 1 inch above the crook of your elbow. The  cords should be inside the groove of your elbow.  4. Sit quietly, with the cuff in place, for about 5 minutes. After that 5 minutes press the power  button to start a reading. 5. Do not talk or move while the reading is taking place.  6. Record your readings on a sheet of paper. Although most cuffs have a memory, it is often  easier to see a pattern developing when the numbers are all in front of you.  7. You can repeat the reading after 1-3 minutes if it is recommended  Make sure your bladder is empty and you have not had caffeine or tobacco within the last 30 min  Always bring your blood pressure log with you to your appointments. If you have not brought your monitor in to be double checked for accuracy, please bring it to your next appointment.  You can find a list of quality blood pressure cuffs at [email or web address].no  Important lifestyle changes to control high blood pressure  Intervention  Effect on the BP  Lose  extra pounds and watch your waistline Weight loss is one of the most effective lifestyle changes for controlling blood pressure. If you're overweight or obese, losing even a small amount of weight can help reduce blood pressure. Blood pressure might go down by about 1 millimeter of mercury (mm Hg) with each kilogram (about 2.2 pounds) of weight lost.  Exercise regularly As a general goal, aim for at least 30 minutes of moderate physical activity every day. Regular physical activity can lower high blood pressure by about 5 to 8 mm Hg.  Eat a healthy diet Eating a diet rich in whole grains, fruits, vegetables, and low-fat dairy products and low in saturated fat and cholesterol. A healthy diet can lower high blood pressure by up to 11 mm Hg.  Reduce salt (sodium) in your diet Even a small reduction of sodium in the diet can improve heart health and reduce high blood pressure by about 5 to 6 mm Hg.  Limit alcohol One drink equals 12 ounces of beer, 5 ounces of wine, or 1.5 ounces of 80-proof liquor.  Limiting alcohol to less than one drink a day for women or two drinks a day for men can help lower blood pressure by about 4 mm Hg.   If you have any  questions or concerns please use My Chart to send questions or call the office at [phone]

## 2023-09-21 NOTE — Progress Notes (Signed)
 Office Visit    Patient Name: Anne [name] Date of Encounter: 09/22/2023  Primary Care Provider:  Alys Schuyler HERO, PA Primary Cardiologist:  Shelda Bruckner, MD  Chief Complaint    Hypertension  Significant Past Medical History   CAD NSTEMI s/p PCI to pLAD  HLD 3/23 LDL 98 no medications  CKD 9/24 GFR > 60, was at 56 last month    Allergies  Allergen Reactions   Amiloride  Other (See Comments)    Hair loss   Anesthetics, Amide Other (See Comments)    Lethargic and very slow reanimation lasting a full day or so   Other Other (See Comments)    Very hard to awake from anaesthetic with surgery. Acute renal failure, hypokalemia Very hard to awake from anaesthetic with surgery.   Prilocaine Other (See Comments)    Lethargic and very slow reanimation lasting a full day or so   Amlodipine Besylate Swelling    Severe edema / Headaches   Augmentin  [Amoxicillin -Pot Clavulanate] Other (See Comments)    Pain in hips and legs   Cardizem [Diltiazem Hcl] Nausea And Vomiting    Hospitalized when it occurred   Cefdinir  Nausea And Vomiting   Diltiazem Hcl Nausea And Vomiting    Hospitalized when it occurred    Eplerenone Other (See Comments)    Hair loss   Indomethacin Other (See Comments)    esophagitis   Naproxen  Other (See Comments)    Gastric intolerance   Tenex [Guanfacine Hcl] Other (See Comments)    Chronic insomnia    Triamterene -Hctz Other (See Comments)    Acute renal failure, hypokalemia    Cephalexin Other (See Comments)   Hydralazine  Hcl Other (See Comments)    Fluid retention   Imdur  [Isosorbide  Nitrate]     Elevated blood pressure, neck cramps, headache, dizziness, blurred vision    Nebivolol  Hcl     Other reaction(s): disoriented   Potassium Chloride  Other (See Comments)    Severe hair loss, baldness   Spironolactone  Other (See Comments)    hyperkalemia   Hytrin [Terazosin Hcl] Other (See Comments)    Pt states she can not take this med but  can not relate any side effect or incident that occurred   Lisinopril  Other (See Comments)    Unknown, pt says she can't take it, cough/hypotension and thirst    History of Present Illness    BRINDA FOCHT is a 78 y.o. female patient of Dr Bruckner, in the office today for hypertension follow up.  She was seen by Reche Finder in early October, with a BP of 152/84.   She has had multiple medication intolerances and was told to increase clonidine  patch from 0.2 to 0.3 mg.  She then saw Medford Bolk PharmD 2 weeks later and pressure was still elevated.  She was asked to monitor home readings and bring log and home device to follow up.   When I saw her in November BP was still elevated and she had just recently increased clonidine  patches to 0.3 mg.  Unfortunately she called 3-4 weeks later and reported that the patches were causing her a rash.  She was given clonidine  tablets to help taper off the medication and started on triamterene /hctz 37.5/25 mg daily. She then reported hypotension and the clonidine  was weaned down a little faster.    Today she is in the office for follow up. Tolerating the triamterene /hctz.  Will be going to PCP to get VIt D re-checked later this week.  Takes in the AM with food.  Thinks having memory issues - dementia, runs in the family,   Blood Pressure Goal:  130/80  Current Medications:  triamterene /hctz 37.5/25  Previously tried:   Nebivolol  - disoriented Carvedilol  - hypotension Diltiazem - severe fatigue Valsartan - GI distress Hydralazine  - fluid retention Metoprolol  - hypotension Triamterene -HCTZ - dehydration Amlodipine - swelling Lisinopril  - cough Losartan  - hair loss Doxazosin  - dizziness Eplerenone - hair loss Spironolactone  - bothered by potassium dietary restrictions. Tolerated 25mg  but not 50mg . Per patient preference, remain off. Imdur  - neck pain Clonidine  patch - skin irritation  Family Hx: mother had htn, stroke, one brother had  stroke    Social Hx:      Tobacco: no  Alcohol: no - only 1-2 per year  Caffeine: no coffee, no caffeine  Diet:  eating 1 good meal, not much of an appetite; not much protein, avoids red meat; plenty of vegetables; some pasta, whole grain breads  Home BP readings:  readings this month at home average 129/80 and home device found to be accurate within 10 points   Accessory Clinical Findings    Lab Results  Component Value Date   CREATININE 0.95 06/09/2023   BUN 13 06/09/2023   NA 138 06/09/2023   K 4.7 06/09/2023   CL 106 06/09/2023   CO2 25 06/09/2023   Lab Results  Component Value Date   ALT 12 12/07/2021   AST 19 12/07/2021   ALKPHOS 46 12/07/2021   BILITOT 1.1 12/07/2021   Lab Results  Component Value Date   HGBA1C 5.9 (H) 12/05/2021    Home Medications    Current Outpatient Medications  Medication Sig Dispense Refill   alendronate (FOSAMAX) 70 MG tablet Take by mouth.     aspirin  EC 81 MG tablet TAKE 1 TABLET (81 MG TOTAL) BY MOUTH DAILY. SWALLOW WHOLE. 90 tablet 2   Cod Liver Oil OIL Take 5 mLs by mouth daily.     Multiple Vitamin (MULTIVITAMIN) LIQD Take 5 mLs by mouth daily.     nitroGLYCERIN  (NITROSTAT ) 0.4 MG SL tablet Place 1 tablet (0.4 mg total) under the tongue every 5 (five) minutes as needed for chest pain. May repeat 2 times, if 3rd dose needed call 911 25 tablet 1   triamterene -hydrochlorothiazide  (MAXZIDE-25) 37.5-25 MG tablet Take 1 tablet by mouth daily. 90 tablet 3   No current facility-administered medications for this visit.     Assessment & Plan    HTN (hypertension) Assessment: BP is uncontrolled in office BP 164/88 mmHg;  above the goal (<130/80). Multiple medication sensitivities Tolerates triamterene /hctz well without any side effects Has tried meds across each class, all with side effects, only options that I see are nifedipine xl or minoxidil.   Denies SOB, palpitation, chest pain, headaches,or swelling Reiterated the importance  of regular exercise and low salt diet   Plan:  Continue taking triamterene /hctz 37.5/25 once daily Patient to keep record of BP readings with heart rate and report to us  at the next visit No changes in meds, she is not willing to add on anything at this time Patient to follow up with PharmD in 3 months  Labs ordered today:  none   Allean Mink PharmD CPP [facility]  [address] [place], [place] [address] [phone]

## 2023-09-22 ENCOUNTER — Ambulatory Visit (HOSPITAL_BASED_OUTPATIENT_CLINIC_OR_DEPARTMENT_OTHER): Payer: PPO | Admitting: Pharmacist Clinician (PhC)/ Clinical Pharmacy Specialist

## 2023-09-22 ENCOUNTER — Encounter (HOSPITAL_BASED_OUTPATIENT_CLINIC_OR_DEPARTMENT_OTHER): Payer: Self-pay | Admitting: Pharmacist Clinician (PhC)/ Clinical Pharmacy Specialist

## 2023-09-22 VITALS — BP 164/88 | HR 82

## 2023-09-22 DIAGNOSIS — I10 Essential (primary) hypertension: Secondary | ICD-10-CM | POA: Diagnosis not present

## 2023-09-22 NOTE — Assessment & Plan Note (Addendum)
 Assessment: BP is uncontrolled in office BP 164/88 mmHg;  above the goal (<130/80). Multiple medication sensitivities Tolerates triamterene /hctz well without any side effects Has tried meds across each class, all with side effects, only options that I see are nifedipine xl or minoxidil.   Denies SOB, palpitation, chest pain, headaches,or swelling Reiterated the importance of regular exercise and low salt diet   Plan:  Continue taking triamterene /hctz 37.5/25 once daily Patient to keep record of BP readings with heart rate and report to us  at the next visit No changes in meds, she is not willing to add on anything at this time Patient to follow up with PharmD in 3 months  Labs ordered today:  none

## 2023-11-13 ENCOUNTER — Other Ambulatory Visit: Payer: Self-pay | Admitting: Internal Medicine

## 2023-11-13 DIAGNOSIS — R42 Dizziness and giddiness: Secondary | ICD-10-CM

## 2023-11-13 DIAGNOSIS — E559 Vitamin D deficiency, unspecified: Secondary | ICD-10-CM | POA: Diagnosis not present

## 2023-11-13 DIAGNOSIS — R634 Abnormal weight loss: Secondary | ICD-10-CM | POA: Diagnosis not present

## 2023-11-13 DIAGNOSIS — E7800 Pure hypercholesterolemia, unspecified: Secondary | ICD-10-CM | POA: Diagnosis not present

## 2023-11-13 DIAGNOSIS — R413 Other amnesia: Secondary | ICD-10-CM

## 2023-11-13 DIAGNOSIS — I2510 Atherosclerotic heart disease of native coronary artery without angina pectoris: Secondary | ICD-10-CM | POA: Diagnosis not present

## 2023-11-13 DIAGNOSIS — I10 Essential (primary) hypertension: Secondary | ICD-10-CM | POA: Diagnosis not present

## 2023-11-24 ENCOUNTER — Ambulatory Visit
Admission: RE | Admit: 2023-11-24 | Discharge: 2023-11-24 | Disposition: A | Discharge status: Home / Self Care | Source: Ambulatory Visit | Attending: Internal Medicine | Admitting: Internal Medicine

## 2023-11-24 DIAGNOSIS — R42 Dizziness and giddiness: Secondary | ICD-10-CM

## 2023-11-24 DIAGNOSIS — R413 Other amnesia: Secondary | ICD-10-CM

## 2023-12-02 DIAGNOSIS — E559 Vitamin D deficiency, unspecified: Secondary | ICD-10-CM | POA: Diagnosis not present

## 2023-12-02 DIAGNOSIS — I2510 Atherosclerotic heart disease of native coronary artery without angina pectoris: Secondary | ICD-10-CM | POA: Diagnosis not present

## 2023-12-02 DIAGNOSIS — Z713 Dietary counseling and surveillance: Secondary | ICD-10-CM | POA: Diagnosis not present

## 2023-12-02 DIAGNOSIS — I10 Essential (primary) hypertension: Secondary | ICD-10-CM | POA: Diagnosis not present

## 2023-12-02 DIAGNOSIS — E7800 Pure hypercholesterolemia, unspecified: Secondary | ICD-10-CM | POA: Diagnosis not present

## 2023-12-18 ENCOUNTER — Other Ambulatory Visit: Payer: Self-pay | Admitting: Cardiology

## 2023-12-22 ENCOUNTER — Encounter (HOSPITAL_BASED_OUTPATIENT_CLINIC_OR_DEPARTMENT_OTHER): Payer: Self-pay | Admitting: Pharmacist Clinician (PhC)/ Clinical Pharmacy Specialist

## 2023-12-22 ENCOUNTER — Ambulatory Visit (HOSPITAL_BASED_OUTPATIENT_CLINIC_OR_DEPARTMENT_OTHER): Admitting: Pharmacist Clinician (PhC)/ Clinical Pharmacy Specialist

## 2023-12-22 VITALS — BP 172/95 | HR 82

## 2023-12-22 DIAGNOSIS — I10 Essential (primary) hypertension: Secondary | ICD-10-CM | POA: Diagnosis not present

## 2023-12-22 MED ORDER — CARVEDILOL 3.125 MG PO TABS
3.1250 mg | ORAL_TABLET | Freq: Two times a day (BID) | ORAL | 3 refills | Status: DC
Start: 2023-12-22 — End: 2024-03-18

## 2023-12-22 NOTE — Progress Notes (Signed)
 Office Visit    Patient Name: Anne [name] Date of Encounter: 12/22/2023  Primary Care Provider:  Thana Ates, MD Primary Cardiologist:  Jodelle Red, MD  Chief Complaint    Hypertension  Significant Past Medical History   CAD NSTEMI s/p PCI to pLAD  HLD 3/23 LDL 98 no medications  CKD 9/24 GFR > 60, was at 56 last month    Allergies  Allergen Reactions   Amiloride Other (See Comments)    Hair loss   Anesthetics, Amide Other (See Comments)    Lethargic and very slow reanimation lasting a full day or so   Other Other (See Comments)    Very hard to awake from anaesthetic with surgery. Acute renal failure, hypokalemia Very hard to awake from anaesthetic with surgery.   Prilocaine Other (See Comments)    Lethargic and very slow reanimation lasting a full day or so   Amlodipine Besylate Swelling    Severe edema / Headaches   Augmentin [Amoxicillin-Pot Clavulanate] Other (See Comments)    Pain in hips and legs   Cardizem [Diltiazem Hcl] Nausea And Vomiting    Hospitalized when it occurred   Cefdinir Nausea And Vomiting   Diltiazem Hcl Nausea And Vomiting    Hospitalized when it occurred    Eplerenone Other (See Comments)    Hair loss   Indomethacin Other (See Comments)    esophagitis   Naproxen Other (See Comments)    Gastric intolerance   Tenex [Guanfacine Hcl] Other (See Comments)    Chronic insomnia    Triamterene-Hctz Other (See Comments)    Acute renal failure, hypokalemia    Cephalexin Other (See Comments)   Hydralazine Hcl Other (See Comments)    Fluid retention   Imdur [Isosorbide Nitrate]     Elevated blood pressure, neck cramps, headache, dizziness, blurred vision    Nebivolol Hcl     Other reaction(s): disoriented   Potassium Chloride Other (See Comments)    Severe hair loss, baldness   Spironolactone Other (See Comments)    hyperkalemia   Hytrin [Terazosin Hcl] Other (See Comments)    Pt states she can not take this med but can  not relate any side effect or incident that occurred   Lisinopril Other (See Comments)    Unknown, pt says she "can't take it", cough/hypotension and thirst    History of Present Illness    Anne [name] is a 78 y.o. female patient of Dr Cristal Deer, in the office today for hypertension follow up.  She was seen by Gillian Shields in early October, with a BP of 152/84.   She has had multiple medication intolerances and was told to increase clonidine patch from 0.2 to 0.3 mg.  She then saw Laural Golden PharmD 2 weeks later and pressure was still elevated.  She was asked to monitor home readings and bring log and home device to follow up.   When I saw her in November BP was still elevated and she had just recently increased clonidine patches to 0.3 mg.  Unfortunately she called 3-4 weeks later and reported that the patches were causing her a rash.  She was given clonidine tablets to help taper off the medication and started on triamterene/hctz 37.5/25 mg daily. She then reported hypotension and the clonidine was weaned down a little faster.  At her last visit, pressure was still elevated at 164/88 on just triamterene/hctz 37.5/25. Patient was unwilling to add further medications.  She was advised to return in  3 months.    Today she is in the office for follow up.  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  This has led to concerns for stroke and she is more anxious to lower her BP.  Unfortunately she has a long list of meds with side effects.  We discussed the need to choose the lesser evil from this list, as the only other option we have not tried is minoxidil.    Blood Pressure Goal:  130/80  Current Medications:  triamterene/hctz 37.5/25  Previously tried:   Nebivolol - disoriented Carvedilol - hypotension Diltiazem - severe fatigue Valsartan - GI distress Hydralazine - fluid retention Metoprolol -  hypotension Triamterene-HCTZ - dehydration Amlodipine - swelling Lisinopril - cough Losartan - hair loss Doxazosin - dizziness Eplerenone - hair loss Spironolactone - bothered by potassium dietary restrictions. Tolerated 25mg  but not 50mg . Per patient preference, remain off. Imdur - neck pain Clonidine patch - skin irritation  Family Hx: mother had htn, stroke, one brother had stroke    Social Hx:      Tobacco: no  Alcohol: no - only 1-2 per year  Caffeine: no coffee, no caffeine  Diet:  eating 1 good meal, not much of an appetite; not much protein, avoids red meat; plenty of vegetables; some pasta, whole grain breads; added oatmeal in daily  Home BP readings: home readings "not good" -150s; has been worse   readings this month at home average 129/80 and home device found to be accurate within 10 points   Accessory Clinical Findings    Lab Results  Component Value Date   CREATININE 0.95 06/09/2023   BUN 13 06/09/2023   NA 138 06/09/2023   K 4.7 06/09/2023   CL 106 06/09/2023   CO2 25 06/09/2023   Lab Results  Component Value Date   ALT 12 12/07/2021   AST 19 12/07/2021   ALKPHOS 46 12/07/2021   BILITOT 1.1 12/07/2021   Lab Results  Component Value Date   HGBA1C 5.9 (H) 12/05/2021    Home Medications    Current Outpatient Medications  Medication Sig Dispense Refill   carvedilol (COREG) 3.125 MG tablet Take 1 tablet (3.125 mg total) by mouth 2 (two) times daily. 60 tablet 3   alendronate (FOSAMAX) 70 MG tablet Take by mouth.     ASPIRIN LOW DOSE 81 MG tablet TAKE 1 TABLET (81 MG TOTAL) BY MOUTH DAILY. SWALLOW WHOLE. 90 tablet 2   Cod Liver Oil OIL Take 5 mLs by mouth daily.     Multiple Vitamin (MULTIVITAMIN) LIQD Take 5 mLs by mouth daily.     nitroGLYCERIN (NITROSTAT) 0.4 MG SL tablet Place 1 tablet (0.4 mg total) under the tongue every 5 (five) minutes as needed for chest pain. May repeat 2 times, if 3rd dose needed call 911 25 tablet 1    triamterene-hydrochlorothiazide (MAXZIDE-25) 37.5-25 MG tablet Take 1 tablet by mouth daily. 90 tablet 3   No current facility-administered medications for this visit.     Assessment & Plan    HTN (hypertension) Assessment: BP is uncontrolled in office BP 172/95 mmHg;  above the goal (<130/80). Concerned about continued elevated readings in light of recent brain MRI showing small chronic cerebellar infarct.  Specifically worried about stroke and memory loss.  Tolerates triamterene/hctz well without any side effects Denies SOB, palpitation, chest pain, headaches,or swelling Reiterated the importance of regular exercise and low salt diet -  she was encouraged to resume eating potassium rich foods that she has previously avoided.  Most recent K was at 3.5.  Suggested she eat as she would like and we can repeat labs next visit, to show her that it is okay to eat bananas, avocados, etc in normal amounts.   Plan:  Start taking carvedilol 3.125 mg twice daily Continue taking triamterene/hctz 37.5/25 mg once daily Increase potassium containing foods to normal amounts - we will check BMET at next visit Patient to keep record of BP readings with heart rate and report to us  at the next visit Patient to follow up with Eesha Schmaltz PharmD in 4 weeks  Labs ordered today:  none   Donivan Furry PharmD CPP [facility]  [address] [place], [place] [address] [phone]

## 2023-12-22 NOTE — Patient Instructions (Signed)
 Follow up appointment: Friday May 9 at 8:30 am  Take your BP meds as follows:  Start carvedilol 3.125 mg twice daily  Continue with triamterene/hctz 37.5/25  Check your blood pressure at home daily (if able) and keep record of the readings.  Your blood pressure goal is < 130/80  To check your pressure at home you will need to:  1. Sit up in a chair, with feet flat on the floor and back supported. Do not cross your ankles or legs. 2. Rest your left arm so that the cuff is about heart level. If the cuff goes on your upper arm,  then just relax the arm on the table, arm of the chair or your lap. If you have a wrist cuff, we  suggest relaxing your wrist against your chest (think of it as Pledging the Flag with the  wrong arm).  3. Place the cuff snugly around your arm, about 1 inch above the crook of your elbow. The  cords should be inside the groove of your elbow.  4. Sit quietly, with the cuff in place, for about 5 minutes. After that 5 minutes press the power  button to start a reading. 5. Do not talk or move while the reading is taking place.  6. Record your readings on a sheet of paper. Although most cuffs have a memory, it is often  easier to see a pattern developing when the numbers are all in front of you.  7. You can repeat the reading after 1-3 minutes if it is recommended  Make sure your bladder is empty and you have not had caffeine or tobacco within the last 30 min  Always bring your blood pressure log with you to your appointments. If you have not brought your monitor in to be double checked for accuracy, please bring it to your next appointment.  You can find a list of quality blood pressure cuffs at [email or web address]  Important lifestyle changes to control high blood pressure  Intervention  Effect on the BP  Lose extra pounds and watch your waistline Weight loss is one of the most effective lifestyle changes for controlling blood pressure. If you're overweight or obese,  losing even a small amount of weight can help reduce blood pressure. Blood pressure might go down by about 1 millimeter of mercury (mm Hg) with each kilogram (about 2.2 pounds) of weight lost.  Exercise regularly As a general goal, aim for at least 30 minutes of moderate physical activity every day. Regular physical activity can lower high blood pressure by about 5 to 8 mm Hg.  Eat a healthy diet Eating a diet rich in whole grains, fruits, vegetables, and low-fat dairy products and low in saturated fat and cholesterol. A healthy diet can lower high blood pressure by up to 11 mm Hg.  Reduce salt (sodium) in your diet Even a small reduction of sodium in the diet can improve heart health and reduce high blood pressure by about 5 to 6 mm Hg.  Limit alcohol One drink equals 12 ounces of beer, 5 ounces of wine, or 1.5 ounces of 80-proof liquor.  Limiting alcohol to less than one drink a day for women or two drinks a day for men can help lower blood pressure by about 4 mm Hg.   If you have any questions or concerns please use My Chart to send questions or call the office at [phone]

## 2023-12-22 NOTE — Assessment & Plan Note (Signed)
 Assessment: BP is uncontrolled in office BP 172/95 mmHg;  above the goal (<130/80). Concerned about continued elevated readings in light of recent brain MRI showing small chronic cerebellar infarct.  Specifically worried about stroke and memory loss.  Tolerates triamterene/hctz well without any side effects Denies SOB, palpitation, chest pain, headaches,or swelling Reiterated the importance of regular exercise and low salt diet - she was encouraged to resume eating potassium rich foods that she has previously avoided.  Most recent K was at 3.5.  Suggested she eat as she would like and we can repeat labs next visit, to show her that it is okay to eat bananas, avocados, etc in normal amounts.   Plan:  Start taking carvedilol 3.125 mg twice daily Continue taking triamterene/hctz 37.5/25 mg once daily Increase potassium containing foods to normal amounts - we will check BMET at next visit Patient to keep record of BP readings with heart rate and report to us  at the next visit Patient to follow up with Pace Lamadrid PharmD in 4 weeks  Labs ordered today:  none

## 2023-12-23 ENCOUNTER — Ambulatory Visit: Admitting: Neurology

## 2023-12-23 ENCOUNTER — Encounter: Payer: Self-pay | Admitting: Neurology

## 2023-12-23 VITALS — BP 167/87 | HR 81 | Ht 64.0 in | Wt 129.0 lb

## 2023-12-23 DIAGNOSIS — R413 Other amnesia: Secondary | ICD-10-CM

## 2023-12-23 DIAGNOSIS — Z820 Family history of epilepsy and other diseases of the nervous system: Secondary | ICD-10-CM

## 2023-12-23 DIAGNOSIS — G3184 Mild cognitive impairment, so stated: Secondary | ICD-10-CM

## 2023-12-23 DIAGNOSIS — I639 Cerebral infarction, unspecified: Secondary | ICD-10-CM

## 2023-12-23 DIAGNOSIS — E7849 Other hyperlipidemia: Secondary | ICD-10-CM | POA: Diagnosis not present

## 2023-12-23 DIAGNOSIS — G309 Alzheimer's disease, unspecified: Secondary | ICD-10-CM | POA: Diagnosis not present

## 2023-12-23 MED ORDER — DONEPEZIL HCL 5 MG PO TABS: 5.0000 mg | ORAL_TABLET | Freq: Every day | ORAL | 3 refills | Status: DC
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
## 2023-12-23 NOTE — Patient Instructions (Signed)
 I had a long discussion with the patient with regards to her small cerebral stroke and discussed results of available neurovascular imaging and answered questions about stroke prevention and treatment.  I recommend she continue aspirin for stroke prevention and maintain aggressive risk factor modification with strict control of hypertension with blood pressure goal below 130/90, lipids with LDL cholesterol goal below 70 mg percent and diabetes with hemoglobin A1c goal below 6.5%.  We also discussed her memory loss and cognitive concerns which likely represent mild dementia.  Given strong family history of Alzheimer's she may be at risk for the same.  I recommend further evaluation by checking dementia panel labs to look for reversible causes as well as EEG.  Check ATN profile and apoprotein profile to evaluate for Alzheimer's and also check amyloid PET scan.  If Alzheimer's pathology is confirmed may consider treatment with Kisunla  or Leqembi in the Future.  Trial of Aricept 5 mg daily with food for 4 weeks and increase if tolerated to 10 mg daily.  We also discussed memory compensation strategies.  I also advised her to increase participating regularly in cognitively challenging activities like solving crossword puzzles, playing bridge and sudoku.  She will return for follow-up in 6 months or call earlier if necessary.  Memory Compensation Strategies  Use "WARM" strategy.  W= write it down  A= associate it  R= repeat it  M= make a mental note  2.   You can keep a Glass blower/designer.  Use a 3-ring notebook with sections for the following: calendar, important names and phone numbers,  medications, doctors' names/phone numbers, lists/reminders, and a section to journal what you did  each day.   3.    Use a calendar to write appointments down.  4.    Write yourself a schedule for the day.  This can be placed on the calendar or in a separate section of the Memory Notebook.  Keeping a  regular schedule can  help memory.  5.    Use medication organizer with sections for each day or morning/evening pills.  You may need help loading it  6.    Keep a basket, or pegboard by the door.  Place items that you need to take out with you in the basket or on the pegboard.  You may also want to  include a message board for reminders.  7.    Use sticky notes.  Place sticky notes with reminders in a place where the task is performed.  For example: " turn off the  stove" placed by the stove, "lock the door" placed on the door at eye level, " take your medications" on  the bathroom mirror or by the place where you normally take your medications.  8.    Use alarms/timers.  Use while cooking to remind yourself to check on food or as a reminder to take your medicine, or as a  reminder to make a call, or as a reminder to perform another task, etc.

## 2023-12-26 LAB — DEMENTIA PANEL
Homocysteine: 14.5 umol/L (ref 0.0–19.2)
RPR Ser Ql: NONREACTIVE
TSH: 1.55 u[IU]/mL (ref 0.450–4.500)
Vitamin B-12: 999 pg/mL (ref 232–1245)

## 2023-12-26 LAB — ATN PROFILE
A -- Beta-amyloid 42/40 Ratio: 0.102 — ABNORMAL LOW (ref >0.102)
Beta-amyloid 40: 188.42 pg/mL
Beta-amyloid 42: 19.23 pg/mL
N -- NfL, Plasma: 3.52 pg/mL (ref 0.00–7.64)
T -- p-tau181: 1.26 pg/mL — ABNORMAL HIGH (ref 0.00–0.97)

## 2023-12-28 NOTE — Progress Notes (Signed)
 Kindly inform the patient that blood tests  are consistent with presence of Alzheimer's related pathology.  Lab work for reversible causes of memory loss was all satisfactory

## 2023-12-29 ENCOUNTER — Telehealth: Payer: Self-pay | Admitting: Neurology

## 2023-12-29 ENCOUNTER — Ambulatory Visit (INDEPENDENT_AMBULATORY_CARE_PROVIDER_SITE_OTHER): Admitting: Neurology

## 2023-12-29 DIAGNOSIS — R413 Other amnesia: Secondary | ICD-10-CM

## 2023-12-29 DIAGNOSIS — R4182 Altered mental status, unspecified: Secondary | ICD-10-CM

## 2023-12-29 NOTE — Telephone Encounter (Signed)
 Appointment details confirmed

## 2024-01-01 ENCOUNTER — Telehealth: Payer: Self-pay | Admitting: Neurology

## 2024-01-01 NOTE — Telephone Encounter (Signed)
 Pt's husband called back and had his wife on the phone and I reviewed in detailed the results with her. She verbalized understanding. She has picked up aricept  but had not been taking it yet. Advised to start that and she will. Informed her about the PET imaging and once those results are read we will be in touch. Pt verbalized understanding. Pt had no questions at this time but was encouraged to call back if questions arise.

## 2024-01-01 NOTE — Telephone Encounter (Signed)
-----   Message from Ardella Beaver sent at 12/28/2023  1:16 PM EDT ----- Anne [name] inform the patient that blood tests  are consistent with presence of Alzheimer's related pathology.  Lab work for reversible causes of memory loss was all satisfactory

## 2024-01-01 NOTE — Telephone Encounter (Signed)
 Called the patient's husband and was able to review the blood work results with him. He had some questions in which I was able to review with the patient. He states I was able to answer his questions and verbalized understanding. She will be getting the PET imaging completed. I advised once we have those results we will be in touch with those as well.

## 2024-01-02 ENCOUNTER — Encounter (HOSPITAL_COMMUNITY)
Admission: RE | Admit: 2024-01-02 | Discharge: 2024-01-02 | Disposition: A | Discharge status: Home / Self Care | Source: Ambulatory Visit | Attending: Neurology | Admitting: Neurology

## 2024-01-02 DIAGNOSIS — G309 Alzheimer's disease, unspecified: Secondary | ICD-10-CM | POA: Insufficient documentation

## 2024-01-02 DIAGNOSIS — R9082 White matter disease, unspecified: Secondary | ICD-10-CM | POA: Diagnosis not present

## 2024-01-02 MED ORDER — FLORBETAPIR F 18 500-1900 MBQ/ML IV SOLN
10.4810 | Freq: Once | INTRAVENOUS | Status: AC
  Administered 2024-01-02: 10.481 via INTRAVENOUS

## 2024-01-06 NOTE — Progress Notes (Signed)
 Kindly inform the patient that EEG of brainwave study was normal.  No seizure activity noted.

## 2024-01-10 NOTE — Progress Notes (Signed)
 Kindly inform the patient that amyloid PET scan of the brain is suggestive of Alzheimer's pathology in the brain

## 2024-01-14 NOTE — Telephone Encounter (Signed)
-----   Message from Anne [name] sent at 01/10/2024  3:36 PM EDT ----- [name] inform the patient that amyloid PET scan of the brain is suggestive of Alzheimer's pathology in the brain

## 2024-01-14 NOTE — Telephone Encounter (Signed)
 Called the pt and reviewed the EEG and PET amyloid imaging came back. Advised the EEG was normal. The Pet Amyloid was positive for the protein markers. I spent a total of 25 minutes on the phone reviewing the information and listening to her concerns. She is wanting to move forward with next steps to keep her mind where it needs to be. I have advised I would reach out to Dr Janett Medin and ask if he agrees with moving forward with Leqembi. She was appreciative for the information.

## 2024-01-16 ENCOUNTER — Ambulatory Visit (HOSPITAL_BASED_OUTPATIENT_CLINIC_OR_DEPARTMENT_OTHER): Admitting: Pharmacist Clinician (PhC)/ Clinical Pharmacy Specialist

## 2024-01-16 ENCOUNTER — Encounter (HOSPITAL_BASED_OUTPATIENT_CLINIC_OR_DEPARTMENT_OTHER): Payer: Self-pay | Admitting: Pharmacist Clinician (PhC)/ Clinical Pharmacy Specialist

## 2024-01-16 VITALS — BP 156/80 | HR 82

## 2024-01-16 DIAGNOSIS — I10 Essential (primary) hypertension: Secondary | ICD-10-CM | POA: Diagnosis not present

## 2024-01-16 NOTE — Patient Instructions (Signed)
 Follow up appointment: with Dr. Veryl Gottron in September  Take your BP meds as follows: continue with the triamterene /hctz once daily and carvedilol  twice daily  When you take the alendronate every Saturday - take it first thing in the morning on an empty stomach with 8 ounces of water.  Do not take any other medications or eat for 30 minutes.  Do not lie back down after taking.  After the 30 minutes you can eat, take other medications or lie back down.    Check your blood pressure at home daily (if able) and keep record of the readings.  Your blood pressure goal is < 130/80  To check your pressure at home you will need to:  1. Sit up in a chair, with feet flat on the floor and back supported. Do not cross your ankles or legs. 2. Rest your left arm so that the cuff is about heart level. If the cuff goes on your upper arm,  then just relax the arm on the table, arm of the chair or your lap. If you have a wrist cuff, we  suggest relaxing your wrist against your chest (think of it as Pledging the Flag with the  wrong arm).  3. Place the cuff snugly around your arm, about 1 inch above the crook of your elbow. The  cords should be inside the groove of your elbow.  4. Sit quietly, with the cuff in place, for about 5 minutes. After that 5 minutes press the power  button to start a reading. 5. Do not talk or move while the reading is taking place.  6. Record your readings on a sheet of paper. Although most cuffs have a memory, it is often  easier to see a pattern developing when the numbers are all in front of you.  7. You can repeat the reading after 1-3 minutes if it is recommended  Make sure your bladder is empty and you have not had caffeine or tobacco within the last 30 min  Always bring your blood pressure log with you to your appointments. If you have not brought your monitor in to be double checked for accuracy, please bring it to your next appointment.  You can find a list of quality  blood pressure cuffs at [email or web address]  Important lifestyle changes to control high blood pressure  Intervention  Effect on the BP  Lose extra pounds and watch your waistline Weight loss is one of the most effective lifestyle changes for controlling blood pressure. If you're overweight or obese, losing even a small amount of weight can help reduce blood pressure. Blood pressure might go down by about 1 millimeter of mercury (mm Hg) with each kilogram (about 2.2 pounds) of weight lost.  Exercise regularly As a general goal, aim for at least 30 minutes of moderate physical activity every day. Regular physical activity can lower high blood pressure by about 5 to 8 mm Hg.  Eat a healthy diet Eating a diet rich in whole grains, fruits, vegetables, and low-fat dairy products and low in saturated fat and cholesterol. A healthy diet can lower high blood pressure by up to 11 mm Hg.  Reduce salt (sodium) in your diet Even a small reduction of sodium in the diet can improve heart health and reduce high blood pressure by about 5 to 6 mm Hg.  Limit alcohol One drink equals 12 ounces of beer, 5 ounces of wine, or 1.5 ounces of 80-proof liquor.  Limiting alcohol to less than  one drink a day for women or two drinks a day for men can help lower blood pressure by about 4 mm Hg.   If you have any questions or concerns please use My Chart to send questions or call the office at [phone]

## 2024-01-16 NOTE — Progress Notes (Signed)
 Office Visit    Patient Name: Anne [name] Date of Encounter: 01/16/2024  Primary Care Provider:  Tena Feeling, MD Primary Cardiologist:  Sheryle Donning, MD  Chief Complaint    Hypertension  Significant Past Medical History   CAD NSTEMI s/p PCI to pLAD  HLD 3/23 LDL 98 no medications  CKD 9/24 GFR > 60, was at 56 last month    Allergies  Allergen Reactions   Amiloride  Other (See Comments)    Hair loss   Anesthetics, Amide Other (See Comments)    Lethargic and very slow reanimation lasting a full day or so   Other Other (See Comments)    Very hard to awake from anaesthetic with surgery. Acute renal failure, hypokalemia Very hard to awake from anaesthetic with surgery.   Prilocaine Other (See Comments)    Lethargic and very slow reanimation lasting a full day or so   Amlodipine Besylate Swelling    Severe edema / Headaches   Augmentin  [Amoxicillin -Pot Clavulanate] Other (See Comments)    Pain in hips and legs   Cardizem [Diltiazem Hcl] Nausea And Vomiting    Hospitalized when it occurred   Cefdinir  Nausea And Vomiting   Diltiazem Hcl Nausea And Vomiting    Hospitalized when it occurred    Eplerenone Other (See Comments)    Hair loss   Indomethacin Other (See Comments)    esophagitis   Naproxen  Other (See Comments)    Gastric intolerance   Tenex [Guanfacine Hcl] Other (See Comments)    Chronic insomnia    Triamterene -Hctz Other (See Comments)    Acute renal failure, hypokalemia    Cephalexin Other (See Comments)   Hydralazine  Hcl Other (See Comments)    Fluid retention   Imdur  [Isosorbide  Nitrate]     Elevated blood pressure, neck cramps, headache, dizziness, blurred vision    Nebivolol  Hcl     Other reaction(s): disoriented   Potassium Chloride  Other (See Comments)    Severe hair loss, baldness   Spironolactone  Other (See Comments)    hyperkalemia   Hytrin [Terazosin Hcl] Other (See Comments)    Pt states she can not take this med but can  not relate any side effect or incident that occurred   Lisinopril  Other (See Comments)    Unknown, pt says she "can't take it", cough/hypotension and thirst    History of Present Illness    Anne [name] is a 78 y.o. female patient of Dr Veryl Gottron, in the office today for hypertension follow up.   She has been followed by our office for over a year now, working on hypertension management. Unfortunately, she develops intolerances to everything we've tried, leaving us  with just triamterene /hydrochlorothiazide , which is not enough for control.  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  This has led to concerns for stroke and she is more anxious to lower her BP.   Unfortunately she has a long list of meds with side effects.  We discussed the need to choose the lesser evil from this list, as the only other option we have not tried is minoxidil.  She was agreeable to re-challenging with carvedilol , so we started at 3.125 mg twice daily.  She was told to continue the triamterene /hctz.   She was also encouraged to eat more potassium containing foods - she had been purposefully avoiding fruits and vegetables for fears of hyperkalemia.  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    Today she is in the office for follow up.  Continues to note that she takes too many medications and they are not good for her system.  States she developed an electrolyte imbalance from drinking water for her recent PET scan.  Missed both doses of carvedilol  yesterday, did not take this am yet.  States she is dizzy all the time, no falls, but some near misses.  Has been taking the carvedilol  for the most part, but missed yesterday and has not yet had today's first dose.  Tells me that she feels the medication puts her in "zombie-land".     Blood Pressure  Goal:  130/80  Current Medications:  triamterene /hctz 37.5/25, carvedilol  3.125 mg bid  Previously tried:   Nebivolol  - disoriented Carvedilol  - hypotension Diltiazem - severe fatigue Valsartan - GI distress Hydralazine  - fluid retention Metoprolol  - hypotension Triamterene -HCTZ - dehydration Amlodipine - swelling Lisinopril  - cough Losartan  - hair loss Doxazosin  - dizziness Eplerenone - hair loss Spironolactone  - bothered by potassium dietary restrictions. Tolerated 25mg  but not 50mg . Per patient preference, remain off. Imdur  - neck pain Clonidine  patch - skin irritation  Family Hx: mother had htn, stroke, one brother had stroke    Social Hx:      Tobacco: no  Alcohol: no - only 1-2 per year  Caffeine: no coffee, no caffeine  Diet:  eating 1 good meal, not much of an appetite; not much protein, avoids red meat; plenty of vegetables; some pasta, whole grain breads; added oatmeal in daily  Home BP readings: no home readings.  States there is less fluctuation with her dividing the triamterene  to am and noon.     Accessory Clinical Findings    Lab Results  Component Value Date   CREATININE 0.95 06/09/2023   BUN 13 06/09/2023   NA 138 06/09/2023   K 4.7 06/09/2023   CL 106 06/09/2023   CO2 25 06/09/2023   Lab Results  Component Value Date   ALT 12 12/07/2021   AST 19 12/07/2021   ALKPHOS 46 12/07/2021   BILITOT 1.1 12/07/2021   Lab Results  Component Value Date   HGBA1C 5.9 (H) 12/05/2021    Home Medications    Current Outpatient Medications  Medication Sig Dispense Refill   alendronate (FOSAMAX) 70 MG tablet Take by mouth.     ASPIRIN  LOW DOSE 81 MG tablet TAKE 1 TABLET (81 MG TOTAL) BY MOUTH DAILY. SWALLOW WHOLE. 90 tablet 2   carvedilol  (COREG ) 3.125 MG tablet Take 1 tablet (3.125 mg total) by mouth 2 (two) times daily. 60 tablet 3   donepezil  (ARICEPT ) 5 MG tablet Take 1 tablet (5 mg total) by mouth at bedtime. Start 1 tablet daily with food x 4 weeks  and then 2 tablets daily with food 60 tablet 3   ezetimibe (ZETIA) 10 MG tablet Take 10 mg by mouth daily.     Multiple Vitamin (MULTIVITAMIN) LIQD Take 5 mLs by mouth daily.     nitroGLYCERIN  (NITROSTAT ) 0.4 MG SL tablet Place 1 tablet (0.4 mg total) under the tongue every 5 (five) minutes as needed for chest pain. May repeat 2 times, if 3rd dose needed call 911 25 tablet 1   triamterene -hydrochlorothiazide  (MAXZIDE-25) 37.5-25 MG tablet Take 1 tablet by mouth daily. 90 tablet 3   Cod Liver Oil OIL Take 5 mLs by mouth daily.  No current facility-administered medications for this visit.     Assessment & Plan    HTN (hypertension) Assessment: BP is uncontrolled in office BP 156/80 mmHg;  above the goal (<130/80). She declines increasing medications, as they are making her feel so bad Tolerates triamterene /hctz (divides into bid dosing) and carvedilol  3.125 enough to continue Denies SOB, palpitation, chest pain, headaches,or swelling Reiterated the importance of regular exercise and low salt diet   Plan:  Continue taking triamterene /hctz 37.5/25 mg every day , carvedilol  3.125 mg bid Encouraged her to spend more time on her devotions/Bible studies, as this is what seems to bring her peace. Patient to keep record of BP readings with heart rate and report to us  at the next visit Patient to follow up with me in 2 months (suggested she would be fine until seeing Dr. Veryl Gottron in the fall, but she feels better not going so long without cardiology visit.   Labs ordered today:  none   Donivan Furry PharmD CPP [facility]  [address] [place], [place] [address] [phone]

## 2024-01-16 NOTE — Assessment & Plan Note (Signed)
 Assessment: BP is uncontrolled in office BP 156/80 mmHg;  above the goal (<130/80). She declines increasing medications, as they are making her feel so bad Tolerates triamterene /hctz (divides into bid dosing) and carvedilol  3.125 enough to continue Denies SOB, palpitation, chest pain, headaches,or swelling Reiterated the importance of regular exercise and low salt diet   Plan:  Continue taking triamterene /hctz 37.5/25 mg every day , carvedilol  3.125 mg bid Encouraged her to spend more time on her devotions/Bible studies, as this is what seems to bring her peace. Patient to keep record of BP readings with heart rate and report to us  at the next visit Patient to follow up with me in 2 months (suggested she would be fine until seeing Dr. Veryl Gottron in the fall, but she feels better not going so long without cardiology visit.   Labs ordered today:  none

## 2024-01-22 NOTE — Telephone Encounter (Signed)
 Called the patient to advise that Dr Janett Medin is ok with moving forward with the IV medication. Left a VM asking for a call back.

## 2024-02-03 DIAGNOSIS — I10 Essential (primary) hypertension: Secondary | ICD-10-CM | POA: Diagnosis not present

## 2024-02-03 DIAGNOSIS — E7800 Pure hypercholesterolemia, unspecified: Secondary | ICD-10-CM | POA: Diagnosis not present

## 2024-02-03 DIAGNOSIS — E559 Vitamin D deficiency, unspecified: Secondary | ICD-10-CM | POA: Diagnosis not present

## 2024-02-03 DIAGNOSIS — Z713 Dietary counseling and surveillance: Secondary | ICD-10-CM | POA: Diagnosis not present

## 2024-02-03 DIAGNOSIS — I2510 Atherosclerotic heart disease of native coronary artery without angina pectoris: Secondary | ICD-10-CM | POA: Diagnosis not present

## 2024-02-13 DIAGNOSIS — I10 Essential (primary) hypertension: Secondary | ICD-10-CM | POA: Diagnosis not present

## 2024-02-13 DIAGNOSIS — F0280 Dementia in other diseases classified elsewhere without behavioral disturbance: Secondary | ICD-10-CM | POA: Diagnosis not present

## 2024-02-13 DIAGNOSIS — I2510 Atherosclerotic heart disease of native coronary artery without angina pectoris: Secondary | ICD-10-CM | POA: Diagnosis not present

## 2024-02-13 DIAGNOSIS — E7800 Pure hypercholesterolemia, unspecified: Secondary | ICD-10-CM | POA: Diagnosis not present

## 2024-02-13 DIAGNOSIS — E559 Vitamin D deficiency, unspecified: Secondary | ICD-10-CM | POA: Diagnosis not present

## 2024-02-13 DIAGNOSIS — R202 Paresthesia of skin: Secondary | ICD-10-CM | POA: Diagnosis not present

## 2024-02-13 DIAGNOSIS — G309 Alzheimer's disease, unspecified: Secondary | ICD-10-CM | POA: Diagnosis not present

## 2024-02-13 DIAGNOSIS — Z8673 Personal history of transient ischemic attack (TIA), and cerebral infarction without residual deficits: Secondary | ICD-10-CM | POA: Diagnosis not present

## 2024-03-05 DIAGNOSIS — B0229 Other postherpetic nervous system involvement: Secondary | ICD-10-CM | POA: Diagnosis not present

## 2024-03-05 DIAGNOSIS — R21 Rash and other nonspecific skin eruption: Secondary | ICD-10-CM | POA: Diagnosis not present

## 2024-03-17 ENCOUNTER — Other Ambulatory Visit (HOSPITAL_BASED_OUTPATIENT_CLINIC_OR_DEPARTMENT_OTHER): Payer: Self-pay | Admitting: Cardiology

## 2024-03-19 ENCOUNTER — Other Ambulatory Visit: Payer: Self-pay | Admitting: Neurology

## 2024-03-19 ENCOUNTER — Encounter (HOSPITAL_BASED_OUTPATIENT_CLINIC_OR_DEPARTMENT_OTHER): Payer: Self-pay

## 2024-03-19 ENCOUNTER — Ambulatory Visit (HOSPITAL_BASED_OUTPATIENT_CLINIC_OR_DEPARTMENT_OTHER): Admitting: Pharmacist Clinician (PhC)/ Clinical Pharmacy Specialist

## 2024-03-19 VITALS — BP 164/86 | Ht 64.0 in | Wt 129.1 lb

## 2024-03-19 DIAGNOSIS — I10 Essential (primary) hypertension: Secondary | ICD-10-CM

## 2024-03-19 NOTE — Progress Notes (Signed)
 Office Visit    Patient Name: Anne [name] Date of Encounter: 03/19/2024  Primary Care Provider:  Dwight Trula SQUIBB, MD Primary Cardiologist:  Shelda Bruckner, MD  Chief Complaint    Hypertension  Significant Past Medical History   CAD NSTEMI s/p PCI to pLAD  HLD 3/23 LDL 98 no medications  CKD 9/24 GFR > 60, was at 56 last month    Allergies  Allergen Reactions   Amiloride  Other (See Comments)    Hair loss   Anesthetics, Amide Other (See Comments)    Lethargic and very slow reanimation lasting a full day or so   Other Other (See Comments)    Very hard to awake from anaesthetic with surgery. Acute renal failure, hypokalemia Very hard to awake from anaesthetic with surgery.   Prilocaine Other (See Comments)    Lethargic and very slow reanimation lasting a full day or so   Amlodipine Besylate Swelling    Severe edema / Headaches   Augmentin  [Amoxicillin -Pot Clavulanate] Other (See Comments)    Pain in hips and legs   Cardizem [Diltiazem Hcl] Nausea And Vomiting    Hospitalized when it occurred   Cefdinir  Nausea And Vomiting   Diltiazem Hcl Nausea And Vomiting    Hospitalized when it occurred    Eplerenone Other (See Comments)    Hair loss   Indomethacin Other (See Comments)    esophagitis   Naproxen  Other (See Comments)    Gastric intolerance   Tenex [Guanfacine Hcl] Other (See Comments)    Chronic insomnia    Triamterene -Hctz Other (See Comments)    Acute renal failure, hypokalemia    Cephalexin Other (See Comments)   Hydralazine  Hcl Other (See Comments)    Fluid retention   Imdur  [Isosorbide  Nitrate]     Elevated blood pressure, neck cramps, headache, dizziness, blurred vision    Nebivolol  Hcl     Other reaction(s): disoriented   Potassium Chloride  Other (See Comments)    Severe hair loss, baldness   Spironolactone  Other (See Comments)    hyperkalemia   Hytrin [Terazosin Hcl] Other (See Comments)    Pt states she can not take this med but can  not relate any side effect or incident that occurred   Lisinopril  Other (See Comments)    Unknown, pt says she can't take it, cough/hypotension and thirst    History of Present Illness    Anne [name] is a 78 y.o. female patient of Dr Bruckner, in the office today for hypertension follow up.   She has been followed by our office for over a year now, working on hypertension management. Unfortunately, she develops intolerances to everything we've tried, leaving us  with just triamterene /hydrochlorothiazide , which is not enough for control.  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  This has led to concerns for stroke and she is more anxious to lower her BP.   Unfortunately she has a long list of meds with side effects.  We discussed the need to choose the lesser evil from this list, as the only other option we have not tried is minoxidil.  She was agreeable to re-challenging with carvedilol , so we started at 3.125 mg twice daily.  She was told to continue the triamterene /hctz.   She was also encouraged to eat more potassium containing foods - she had been purposefully avoiding fruits and vegetables for fears of hyperkalemia.  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    At her last visit with me she noted she takes too many medications and they are not good for her system.  States she developed an electrolyte imbalance from drinking water for her recent PET scan.  Missed both doses of carvedilol  yesterday, did not take this am yet.  States she is dizzy all the time, no falls, but some near misses.  Has been taking the carvedilol  for the most part, but missed yesterday and has not yet had today's first dose.  Tells me that she feels the medication puts her in zombie-land.     Today she returns for follow up.  Most recently  when seeing PCP for shingles, her BP was noted to be 134/78.  Today she returns for follow up.  Developed shingles close to 4 weeks ago. Left side ear, neck, trunk, arm.  States never had any pain, but her skin has itched continually.  Noted in the office today when I went to re-check her pressure, she actually tensed up and did not relax.    Blood Pressure Goal:  130/80  Current Medications:  triamterene /hctz 37.5/25, carvedilol  3.125 mg bid  Previously tried:   Nebivolol  - disoriented Carvedilol  - hypotension Diltiazem - severe fatigue Valsartan - GI distress Hydralazine  - fluid retention Metoprolol  - hypotension Triamterene -HCTZ - dehydration Amlodipine - swelling Lisinopril  - cough Losartan  - hair loss Doxazosin  - dizziness Eplerenone - hair loss Spironolactone  - bothered by potassium dietary restrictions. Tolerated 25mg  but not 50mg . Per patient preference, remain off. Imdur  - neck pain Clonidine  patch - skin irritation  Family Hx: mother had htn, stroke, one brother had stroke - recently died; sister had stroke since I saw her last  Social Hx:      Tobacco: no  Alcohol: no - only 1-2 per year  Caffeine: no coffee, no caffeine  Diet:  eating 1 good meal, not much of an appetite; not much protein, avoids red meat; plenty of vegetables; some pasta, whole grain breads; added oatmeal in daily  Exercise: Walking daily in the park minimum of 20 minutes   Home BP readings: no home readings.  States there is less fluctuation with her dividing the triamterene  to am and noon.    Accessory Clinical Findings    Lab Results  Component Value Date   CREATININE 0.95 06/09/2023   BUN 13 06/09/2023   NA 138 06/09/2023   K 4.7 06/09/2023   CL 106 06/09/2023   CO2 25 06/09/2023   Lab Results  Component Value Date   ALT 12 12/07/2021   AST 19 12/07/2021   ALKPHOS 46 12/07/2021   BILITOT 1.1 12/07/2021   Lab Results  Component Value Date   HGBA1C 5.9 (H) 12/05/2021    Home  Medications    Current Outpatient Medications  Medication Sig Dispense Refill   alendronate (FOSAMAX) 70 MG tablet Take by mouth.     ASPIRIN  LOW DOSE 81 MG tablet TAKE 1 TABLET (81 MG TOTAL) BY MOUTH DAILY. SWALLOW WHOLE. 90 tablet 2   carvedilol  (COREG ) 3.125 MG tablet TAKE 1 TABLET BY MOUTH 2 TIMES DAILY. 60 tablet 0   Cod Liver Oil OIL Take 5 mLs by mouth daily.     donepezil  (ARICEPT ) 5 MG tablet Take 1 tablet (5 mg total) by mouth at bedtime. Start 1 tablet daily with food x 4 weeks and then 2 tablets daily  with food 60 tablet 3   ezetimibe (ZETIA) 10 MG tablet Take 10 mg by mouth daily.     Multiple Vitamin (MULTIVITAMIN) LIQD Take 5 mLs by mouth daily.     nitroGLYCERIN  (NITROSTAT ) 0.4 MG SL tablet Place 1 tablet (0.4 mg total) under the tongue every 5 (five) minutes as needed for chest pain. May repeat 2 times, if 3rd dose needed call 911 25 tablet 1   triamterene -hydrochlorothiazide  (MAXZIDE-25) 37.5-25 MG tablet Take 1 tablet by mouth daily. 90 tablet 3   No current facility-administered medications for this visit.     Assessment & Plan    HTN (hypertension) Assessment: BP is uncontrolled in office BP 175/83 mmHg;  above the goal (<130/80). Tolerates triamterene /hctz 37.5/25, carvedilol  3.125 mg bid - has complaints about carvedilol  making her feel as though she's in a fog, but continues to take Denies SOB, palpitation, chest pain, headaches,or swelling Reiterated the importance of regular exercise and low salt diet   Plan:  Continue taking triamterene /hctz 37.5/25, carvedilol  3.125 mg bid Patient to keep record of BP readings with heart rate and report to us  at the next visit Patient to follow up with Dr. Lonni in 2 months  Labs ordered today:  none   Allean Mink PharmD CPP [facility]  [address] [place], [place] [address] [phone]

## 2024-03-19 NOTE — Patient Instructions (Signed)
 Follow up appointment: September 23 at 8:20 am with Dr. Lonni  Take your BP meds as follows: continue with carvedilol  and triamterene /hydrochlorothiazide .    Check your blood pressure at home daily (if able) and keep record of the readings.  Your blood pressure goal is < 130/80  To check your pressure at home you will need to:  1. Sit up in a chair, with feet flat on the floor and back supported. Do not cross your ankles or legs. 2. Rest your left arm so that the cuff is about heart level. If the cuff goes on your upper arm,  then just relax the arm on the table, arm of the chair or your lap. If you have a wrist cuff, we  suggest relaxing your wrist against your chest (think of it as Pledging the Flag with the  wrong arm).  3. Place the cuff snugly around your arm, about 1 inch above the crook of your elbow. The  cords should be inside the groove of your elbow.  4. Sit quietly, with the cuff in place, for about 5 minutes. After that 5 minutes press the power  button to start a reading. 5. Do not talk or move while the reading is taking place.  6. Record your readings on a sheet of paper. Although most cuffs have a memory, it is often  easier to see a pattern developing when the numbers are all in front of you.  7. You can repeat the reading after 1-3 minutes if it is recommended  Make sure your bladder is empty and you have not had caffeine or tobacco within the last 30 min  Always bring your blood pressure log with you to your appointments. If you have not brought your monitor in to be double checked for accuracy, please bring it to your next appointment.  You can find a list of quality blood pressure cuffs at [email or web address]  Important lifestyle changes to control high blood pressure  Intervention  Effect on the BP  Lose extra pounds and watch your waistline Weight loss is one of the most effective lifestyle changes for controlling blood pressure. If you're overweight or  obese, losing even a small amount of weight can help reduce blood pressure. Blood pressure might go down by about 1 millimeter of mercury (mm Hg) with each kilogram (about 2.2 pounds) of weight lost.  Exercise regularly As a general goal, aim for at least 30 minutes of moderate physical activity every day. Regular physical activity can lower high blood pressure by about 5 to 8 mm Hg.  Eat a healthy diet Eating a diet rich in whole grains, fruits, vegetables, and low-fat dairy products and low in saturated fat and cholesterol. A healthy diet can lower high blood pressure by up to 11 mm Hg.  Reduce salt (sodium) in your diet Even a small reduction of sodium in the diet can improve heart health and reduce high blood pressure by about 5 to 6 mm Hg.  Limit alcohol One drink equals 12 ounces of beer, 5 ounces of wine, or 1.5 ounces of 80-proof liquor.  Limiting alcohol to less than one drink a day for women or two drinks a day for men can help lower blood pressure by about 4 mm Hg.   If you have any questions or concerns please use My Chart to send questions or call the office at [phone]

## 2024-03-19 NOTE — Assessment & Plan Note (Signed)
 Assessment: BP is uncontrolled in office BP 175/83 mmHg;  above the goal (<130/80). Tolerates triamterene /hctz 37.5/25, carvedilol  3.125 mg bid - has complaints about carvedilol  making her feel as though she's in a fog, but continues to take Denies SOB, palpitation, chest pain, headaches,or swelling Reiterated the importance of regular exercise and low salt diet   Plan:  Continue taking triamterene /hctz 37.5/25, carvedilol  3.125 mg bid Patient to keep record of BP readings with heart rate and report to us  at the next visit Patient to follow up with Dr. Lonni in 2 months  Labs ordered today:  none

## 2024-03-25 ENCOUNTER — Other Ambulatory Visit: Payer: Self-pay

## 2024-03-25 MED ORDER — DONEPEZIL HCL 10 MG PO TABS: 10.0000 mg | ORAL_TABLET | Freq: Every day | ORAL | 3 refills | Status: DC

## 2024-04-01 ENCOUNTER — Other Ambulatory Visit: Payer: Self-pay | Admitting: Neurology

## 2024-04-02 DIAGNOSIS — I10 Essential (primary) hypertension: Secondary | ICD-10-CM | POA: Diagnosis not present

## 2024-04-02 DIAGNOSIS — B0229 Other postherpetic nervous system involvement: Secondary | ICD-10-CM | POA: Diagnosis not present

## 2024-04-02 DIAGNOSIS — E7800 Pure hypercholesterolemia, unspecified: Secondary | ICD-10-CM | POA: Diagnosis not present

## 2024-04-02 DIAGNOSIS — I2510 Atherosclerotic heart disease of native coronary artery without angina pectoris: Secondary | ICD-10-CM | POA: Diagnosis not present

## 2024-04-02 DIAGNOSIS — F0280 Dementia in other diseases classified elsewhere without behavioral disturbance: Secondary | ICD-10-CM | POA: Diagnosis not present

## 2024-04-02 DIAGNOSIS — Z8673 Personal history of transient ischemic attack (TIA), and cerebral infarction without residual deficits: Secondary | ICD-10-CM | POA: Diagnosis not present

## 2024-04-02 DIAGNOSIS — G309 Alzheimer's disease, unspecified: Secondary | ICD-10-CM | POA: Diagnosis not present

## 2024-04-06 DIAGNOSIS — I2510 Atherosclerotic heart disease of native coronary artery without angina pectoris: Secondary | ICD-10-CM | POA: Diagnosis not present

## 2024-04-06 DIAGNOSIS — Z713 Dietary counseling and surveillance: Secondary | ICD-10-CM | POA: Diagnosis not present

## 2024-04-06 DIAGNOSIS — M810 Age-related osteoporosis without current pathological fracture: Secondary | ICD-10-CM | POA: Diagnosis not present

## 2024-04-06 DIAGNOSIS — E7800 Pure hypercholesterolemia, unspecified: Secondary | ICD-10-CM | POA: Diagnosis not present

## 2024-04-06 DIAGNOSIS — E559 Vitamin D deficiency, unspecified: Secondary | ICD-10-CM | POA: Diagnosis not present

## 2024-04-06 DIAGNOSIS — I10 Essential (primary) hypertension: Secondary | ICD-10-CM | POA: Diagnosis not present

## 2024-06-01 ENCOUNTER — Other Ambulatory Visit (HOSPITAL_BASED_OUTPATIENT_CLINIC_OR_DEPARTMENT_OTHER): Payer: Self-pay

## 2024-06-01 ENCOUNTER — Ambulatory Visit (HOSPITAL_BASED_OUTPATIENT_CLINIC_OR_DEPARTMENT_OTHER): Admitting: Cardiology

## 2024-06-01 ENCOUNTER — Encounter (HOSPITAL_BASED_OUTPATIENT_CLINIC_OR_DEPARTMENT_OTHER): Payer: Self-pay | Admitting: Cardiology

## 2024-06-01 VITALS — BP 164/86 | HR 76 | Resp 16 | Ht 64.0 in | Wt 128.0 lb

## 2024-06-01 DIAGNOSIS — T466X5D Adverse effect of antihyperlipidemic and antiarteriosclerotic drugs, subsequent encounter: Secondary | ICD-10-CM | POA: Diagnosis not present

## 2024-06-01 DIAGNOSIS — E7800 Pure hypercholesterolemia, unspecified: Secondary | ICD-10-CM | POA: Diagnosis not present

## 2024-06-01 DIAGNOSIS — I2510 Atherosclerotic heart disease of native coronary artery without angina pectoris: Secondary | ICD-10-CM

## 2024-06-01 DIAGNOSIS — M791 Myalgia, unspecified site: Secondary | ICD-10-CM

## 2024-06-01 DIAGNOSIS — T466X5A Adverse effect of antihyperlipidemic and antiarteriosclerotic drugs, initial encounter: Secondary | ICD-10-CM

## 2024-06-01 DIAGNOSIS — I10 Essential (primary) hypertension: Secondary | ICD-10-CM

## 2024-06-01 DIAGNOSIS — Z8673 Personal history of transient ischemic attack (TIA), and cerebral infarction without residual deficits: Secondary | ICD-10-CM

## 2024-06-01 MED ORDER — COMIRNATY 30 MCG/0.3ML IM SUSY
0.3000 mL | PREFILLED_SYRINGE | Freq: Once | INTRAMUSCULAR | 0 refills | Status: AC
  Filled 2024-06-01: qty 0.3, 1d supply, fill #0

## 2024-06-01 NOTE — Progress Notes (Signed)
 Cardiology Office Note:  .    Date:  06/01/2024  ID:  Anne Anne [name], DOB February 12, 1946, MRN [id number] PCP: Anne Trula SQUIBB, MD  [facility] Providers Cardiologist:  Anne Bruckner, MD     History of Present Illness: Anne    ALYZA Anne [name] is a 78 y.o. 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 She was previously seen by Dr. [place], and my initial visit with her was 03/27/21.  CV history: NSTEMI 03/2020 with pLAD 95% s/p PCI with synergy 3.5x12 DES. Otherwise 20% pRCA and 20% mid-distal Lcx. Echo at that time with EF 55-60%, mild LVH, G1DD, normal RV, normal pressures, no significant valve disease. She has many medication intolerances to both cholesterol medications and antihypertensives.    Intolerances:   Nebivolol  - disoriented Carvedilol  - hypotension, fogginess, fatigue Diltiazem - severe fatigue Valsartan - GI distress Hydralazine  - fluid retention Metoprolol  - hypotension Triamterene -HCTZ - dehydration (tolerating now) Amlodipine - swelling Lisinopril  - cough Losartan  - hair loss Doxazosin  - dizziness Eplerenone - hair loss Spironolactone  - bothered by potassium dietary restrictions. Tolerated 25mg  but not 50mg . Per patient preference, remain off. Imdur  - neck pain Clonidine  patch - skin irritation   Atorvastatin -myalgia Ezetimibe--myalgia (now back on this)  Today: Since our last visit she has been followed by Anne Finder, NP and Anne Anne [name], PharmD in cardiology. She had uncontrolled blood pressure but was only tolerating triamterene -hydrochlorothiazide . She was on low dose carvedilol  but reported fogginess.  She is no longer taking carvedilol . Feels that she cannot function while taking this due to fatigue. Blood pressure is not controlled. We discussed re-trial of spironolactone , she declines due to concern for diet  restrictions/potassium. We also discussed clonidine  low dose orally as she noted skin irritation on patch. She declines retrial of this. She notes that she has two home machines that she uses to check her BP, and her numbers at home she reports as being better controlled (reports that numbers can be 130s systolic, but no log available).  We reviewed her last lipids from [facility] 03/2024. Tchol 234, HDL 79, LDL 145, TG 65. We reviewed recommendations for management at length. She is taking the ezetimibe and had been at the time of this test. We discussed PCSK9i at length today.  Her biggest concern is dementia. Following with neurology for this. Has increased her walking to try to help her memory. Also notes she is not sleeping well.    She is frustrated as she eats well, maintains healthy weight, avoids salt and fat, and is walking, but her blood pressure and cholesterol are elevated.   Denies chest pain, shortness of breath at rest or with normal exertion. No PND, orthopnea, LE edema or unexpected weight gain. No syncope or palpitations. ROS otherwise negative except as noted.   ROS:  Please see the history of present illness. ROS otherwise negative except as noted.   Studies Reviewed: Anne    EKG Interpretation Date/Time:  Tuesday June 01 2024 08:32:24 EDT Ventricular Rate:  71 PR Interval:  186 QRS Duration:  134 QT Interval:  440 QTC Calculation: 478 R Axis:   -39  Text Interpretation: Normal sinus rhythm Left axis deviation Right bundle branch block Confirmed by Anne [name] Anne [phone]) on 06/01/2024 8:47:21 AM    Physical Exam:    VS:  BP (!) 164/86   Pulse 76   Resp  16   Ht 5' 4 (1.626 m)   Wt 128 lb (58.1 kg)   SpO2 100%   BMI 21.97 kg/m    Wt Readings from Last 3 Encounters:  06/01/24 128 lb (58.1 kg)  03/19/24 129 lb 1.6 oz (58.6 kg)  12/23/23 129 lb (58.5 kg)    GEN: Well nourished, well developed in no acute distress HEENT: Normal, moist mucous membranes NECK:  No JVD CARDIAC: regular rhythm, normal S1 and S2, no rubs or gallops. No murmur. VASCULAR: Radial and DP pulses 2+ bilaterally. No carotid bruits RESPIRATORY:  Clear to auscultation without rales, wheezing or rhonchi  ABDOMEN: Soft, non-tender, non-distended MUSCULOSKELETAL:  Ambulates independently SKIN: Warm and dry, no edema NEUROLOGIC:  Alert and oriented x 3. No focal neuro deficits noted. PSYCHIATRIC:  Normal affect   ASSESSMENT AND PLAN: .    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 Rosemarie 12/2023 given CVA and general recommendations for management -see below re: cholesterol -reviewed red flag warning signs that need immediate medical attention.   Hypercholesterolemia Statin myalgia -LDL goal <70, not at goal -she did not tolerate atorvastatin  due to myalgia -she is back on ezetimibe -We have discussed statin alternatives at length, particularly nexletol and PCSK9i. Discussed that she is high risk given her known CAD, prior NSTEMI, and CVA. I particularly discussed PCSK9i at length. She wants to think about this, she given the drug names so she can research.   Hypertension -was evaluated by Dr. Faythe at [facility] for hyperaldosteronism. I do not have full records of this, but was on spironolactone  for some time. She declines retrial of this -now only on triamterene -hydrochlorothiazide , see above -has extensive medication intolerances, reviewed. She does not want to trial any other medications at this time.  -recheck blood pressure still elevated -recommend she log BP at home and bring those numbers to her next visit   Cardiac risk counseling and prevention recommendations: -recommend heart healthy/Mediterranean diet, with whole grains, fruits, vegetable, fish, lean meats, nuts,  and olive oil. Limit salt. -recommend moderate walking, 3-5 times/week for 30-50 minutes each session. Aim for at least 150 minutes.week. Goal should be pace of 3 miles/hours, or walking 1.5 miles in 30 minutes -recommend avoidance of tobacco products. Avoid excess alcohol.  Dispo: 2-3 months with advanced hypertension clinic  Total time of encounter: I spent 45 minutes dedicated to the care of this patient on the date of this encounter to include pre-visit review of records, face-to-face time with the patient discussing conditions above, and clinical documentation with the electronic health record. We specifically spent time today discussing cholesterol, hypertension guidelines, medication recommendations and counseling.   Signed, Anne Bruckner, MD

## 2024-06-01 NOTE — Patient Instructions (Signed)
 We discussed PCSK9 inhibitors today. There are two that are twice a month injectables (repatha and praluent) and one that is a twice a year (leqvio).   Medication Instructions:  No changes today *If you need a refill on your cardiac medications before your next appointment, please call your pharmacy*   Follow-Up: At [facility], you and your health needs are our priority.  As part of our continuing mission to provide you with exceptional heart care, our providers are all part of one team.  This team includes your primary Cardiologist (physician) and Advanced Practice Providers or APPs (Physician Assistants and Nurse Practitioners) who all work together to provide you with the care you need, when you need it.  Your next appointment:   2-3  month(s)  Provider:   Annabella Scarce, MD

## 2024-06-08 DIAGNOSIS — E7800 Pure hypercholesterolemia, unspecified: Secondary | ICD-10-CM | POA: Diagnosis not present

## 2024-06-08 DIAGNOSIS — I10 Essential (primary) hypertension: Secondary | ICD-10-CM | POA: Diagnosis not present

## 2024-06-08 DIAGNOSIS — Z713 Dietary counseling and surveillance: Secondary | ICD-10-CM | POA: Diagnosis not present

## 2024-06-16 DIAGNOSIS — Z0000 Encounter for general adult medical examination without abnormal findings: Secondary | ICD-10-CM | POA: Diagnosis not present

## 2024-06-16 DIAGNOSIS — E7800 Pure hypercholesterolemia, unspecified: Secondary | ICD-10-CM | POA: Diagnosis not present

## 2024-06-16 DIAGNOSIS — G309 Alzheimer's disease, unspecified: Secondary | ICD-10-CM | POA: Diagnosis not present

## 2024-06-16 DIAGNOSIS — M810 Age-related osteoporosis without current pathological fracture: Secondary | ICD-10-CM | POA: Diagnosis not present

## 2024-06-16 DIAGNOSIS — F0280 Dementia in other diseases classified elsewhere without behavioral disturbance: Secondary | ICD-10-CM | POA: Diagnosis not present

## 2024-06-16 DIAGNOSIS — Z1331 Encounter for screening for depression: Secondary | ICD-10-CM | POA: Diagnosis not present

## 2024-06-16 DIAGNOSIS — I10 Essential (primary) hypertension: Secondary | ICD-10-CM | POA: Diagnosis not present

## 2024-06-16 DIAGNOSIS — E559 Vitamin D deficiency, unspecified: Secondary | ICD-10-CM | POA: Diagnosis not present

## 2024-06-16 DIAGNOSIS — I2510 Atherosclerotic heart disease of native coronary artery without angina pectoris: Secondary | ICD-10-CM | POA: Diagnosis not present

## 2024-06-16 DIAGNOSIS — Z8673 Personal history of transient ischemic attack (TIA), and cerebral infarction without residual deficits: Secondary | ICD-10-CM | POA: Diagnosis not present

## 2024-06-17 DIAGNOSIS — E7800 Pure hypercholesterolemia, unspecified: Secondary | ICD-10-CM | POA: Diagnosis not present

## 2024-06-17 DIAGNOSIS — I10 Essential (primary) hypertension: Secondary | ICD-10-CM | POA: Diagnosis not present

## 2024-06-17 DIAGNOSIS — D61818 Other pancytopenia: Secondary | ICD-10-CM | POA: Diagnosis not present

## 2024-06-18 DIAGNOSIS — R252 Cramp and spasm: Secondary | ICD-10-CM | POA: Diagnosis not present

## 2024-06-18 DIAGNOSIS — M791 Myalgia, unspecified site: Secondary | ICD-10-CM | POA: Diagnosis not present

## 2024-06-23 ENCOUNTER — Telehealth: Payer: Self-pay

## 2024-06-23 ENCOUNTER — Ambulatory Visit: Admitting: Neurology

## 2024-06-23 ENCOUNTER — Encounter: Payer: Self-pay | Admitting: Neurology

## 2024-06-23 VITALS — BP 150/76 | HR 79 | Ht 64.0 in | Wt 127.2 lb

## 2024-06-23 DIAGNOSIS — M810 Age-related osteoporosis without current pathological fracture: Secondary | ICD-10-CM | POA: Insufficient documentation

## 2024-06-23 DIAGNOSIS — E2749 Other adrenocortical insufficiency: Secondary | ICD-10-CM | POA: Insufficient documentation

## 2024-06-23 DIAGNOSIS — Z8673 Personal history of transient ischemic attack (TIA), and cerebral infarction without residual deficits: Secondary | ICD-10-CM | POA: Insufficient documentation

## 2024-06-23 DIAGNOSIS — I700 Atherosclerosis of aorta: Secondary | ICD-10-CM | POA: Insufficient documentation

## 2024-06-23 DIAGNOSIS — I639 Cerebral infarction, unspecified: Secondary | ICD-10-CM

## 2024-06-23 DIAGNOSIS — F02A0 Dementia in other diseases classified elsewhere, mild, without behavioral disturbance, psychotic disturbance, mood disturbance, and anxiety: Secondary | ICD-10-CM

## 2024-06-23 DIAGNOSIS — R413 Other amnesia: Secondary | ICD-10-CM | POA: Diagnosis not present

## 2024-06-23 DIAGNOSIS — Z820 Family history of epilepsy and other diseases of the nervous system: Secondary | ICD-10-CM

## 2024-06-23 DIAGNOSIS — G301 Alzheimer's disease with late onset: Secondary | ICD-10-CM | POA: Diagnosis not present

## 2024-06-23 DIAGNOSIS — F0280 Dementia in other diseases classified elsewhere without behavioral disturbance: Secondary | ICD-10-CM | POA: Insufficient documentation

## 2024-06-23 NOTE — Patient Instructions (Addendum)
 I had a long discussion with the patient with regards to her small silent cerebellar stroke and discussed results of available neurovascular imaging and answered questions about stroke prevention and treatment.  I recommend she continue aspirin  for stroke prevention and maintain aggressive risk factor modification with strict control of hypertension with blood pressure goal below 130/90, lipids with LDL cholesterol goal below 70 mg percent and diabetes with hemoglobin A1c goal below 6.5%.  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  Given strong family history of Alzheimer's she is definitely at risk for the same.   Patient is reluctant to continue Aricept  which she took only for a little while and was not sure if it helped her and has been off it now for the next few months.  She however is agreeable to considering starting Kisunla and we will try to get insurance preauthorization before starting it.  I had a long discussion patient and husband about risk benefits and the need for close neurological surveillance with repeat MRI scans and discuss possibility of developing more likely related imaging abnormalities which need close follow-up.  We also discussed small risk for injection site reactions.  We also discussed memory compensation strategies.  I also advised her to increase participating regularly in cognitively challenging activities like solving crossword puzzles, playing bridge and sudoku.  She will return for follow-up in 6 months or call earlier if necessary.  Donanemab Injection What is this medication? DONANEMAB (doe NAN e mab) treats Alzheimer disease. It works by decreasing the buildup of amyloid, a protein that may cause Alzheimer disease. This may slow down the worsening of symptoms. It is a monoclonal antibody. This medicine may be used for other purposes; ask your health  care provider or pharmacist if you have questions. COMMON BRAND NAME(S): KISUNLA What should I tell my care team before I take this medication? They need to know if you have any of these conditions: Taking a blood thinner An unusual or allergic reaction to donanemab, other medications, foods, dyes, or preservatives Pregnant or trying to get pregnant Breastfeeding How should I use this medication? This medication is infused into a vein. It is given by your care team in a hospital or clinic setting. A special MedGuide will be given to you before each treatment. Be sure to read this information carefully each time. Talk to your care team about the use of this medication in children. Special care may be needed. Overdosage: If you think you have taken too much of this medicine contact a poison control center or emergency room at once. NOTE: This medicine is only for you. Do not share this medicine with others. What if I miss a dose? Keep appointments for follow-up doses. It is important not to miss your dose. Call your care team if you are unable to keep an appointment. What may interact with this medication? Interactions have not been studied. This list may not describe all possible interactions. Give your health care provider a list of all the medicines, herbs, non-prescription drugs, or dietary supplements you use. Also tell them if you smoke, drink alcohol, or use illegal drugs. Some items may interact with your medicine. What should I watch for while using this medication? Visit your care team for regular checks on your progress. Tell your care team if your symptoms do not start to get better or if they get worse. This medication can  cause a serious side effect called amyloid related imaging abnormalities (ARIA). ARIA can cause swelling or bleeding in the brain. Some people have a genetic risk factor that increases the risk of ARIA. Your care team may test you for this risk factor. The risk of  bleeding in the brain is increased in people who take blood thinners. Talk to your care team if you take medications to prevent or treat blood clots. You will have imaging scans before and during your treatment to help your care team monitor for ARIA. Contact your care team right away if you have a severe headache, worsening confusion, dizziness, change in vision, nausea, trouble walking, or seizures. What side effects may I notice from receiving this medication? Side effects that you should report to your care team as soon as possible: Allergic reactions or angioedema--skin rash, itching or hives, swelling of the face, eyes, lips, tongue, arms, or legs, trouble swallowing or breathing Headache, worsening confusion, dizziness, change in vision, nausea, seizures Infusion reactions--chest pain, shortness of breath or trouble breathing, feeling faint or lightheaded Side effects that usually do not require medical attention (report these to your care team if they continue or are bothersome): Headache This list may not describe all possible side effects. Call your doctor for medical advice about side effects. You may report side effects to FDA at 1-800-FDA-1088. Where should I keep my medication? This medication is given in a hospital or clinic. It will not be stored at home. NOTE: This sheet is a summary. It may not cover all possible information. If you have questions about this medicine, talk to your doctor, pharmacist, or health care provider.  2024 Elsevier/Gold Standard (2023-08-08 00:00:00)
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
## 2024-08-03 DIAGNOSIS — E559 Vitamin D deficiency, unspecified: Secondary | ICD-10-CM | POA: Diagnosis not present

## 2024-08-03 DIAGNOSIS — Z713 Dietary counseling and surveillance: Secondary | ICD-10-CM | POA: Diagnosis not present

## 2024-08-03 DIAGNOSIS — M810 Age-related osteoporosis without current pathological fracture: Secondary | ICD-10-CM | POA: Diagnosis not present

## 2024-08-03 DIAGNOSIS — I2510 Atherosclerotic heart disease of native coronary artery without angina pectoris: Secondary | ICD-10-CM | POA: Diagnosis not present

## 2024-08-03 DIAGNOSIS — I10 Essential (primary) hypertension: Secondary | ICD-10-CM | POA: Diagnosis not present

## 2024-08-03 DIAGNOSIS — E7800 Pure hypercholesterolemia, unspecified: Secondary | ICD-10-CM | POA: Diagnosis not present

## 2024-08-07 ENCOUNTER — Other Ambulatory Visit: Payer: Self-pay | Admitting: Cardiovascular Disease

## 2024-08-09 ENCOUNTER — Encounter (HOSPITAL_BASED_OUTPATIENT_CLINIC_OR_DEPARTMENT_OTHER): Payer: Self-pay | Admitting: Cardiovascular Disease

## 2024-08-09 ENCOUNTER — Ambulatory Visit (HOSPITAL_BASED_OUTPATIENT_CLINIC_OR_DEPARTMENT_OTHER): Admitting: Cardiovascular Disease

## 2024-08-09 VITALS — BP 153/79 | HR 72 | Ht 64.0 in | Wt 129.1 lb

## 2024-08-09 DIAGNOSIS — I2510 Atherosclerotic heart disease of native coronary artery without angina pectoris: Secondary | ICD-10-CM | POA: Diagnosis not present

## 2024-08-09 DIAGNOSIS — E269 Hyperaldosteronism, unspecified: Secondary | ICD-10-CM

## 2024-08-09 DIAGNOSIS — I160 Hypertensive urgency: Secondary | ICD-10-CM

## 2024-08-09 DIAGNOSIS — I700 Atherosclerosis of aorta: Secondary | ICD-10-CM | POA: Diagnosis not present

## 2024-08-09 DIAGNOSIS — F0280 Dementia in other diseases classified elsewhere without behavioral disturbance: Secondary | ICD-10-CM

## 2024-08-09 DIAGNOSIS — I5032 Chronic diastolic (congestive) heart failure: Secondary | ICD-10-CM

## 2024-08-09 DIAGNOSIS — G301 Alzheimer's disease with late onset: Secondary | ICD-10-CM

## 2024-08-09 NOTE — Patient Instructions (Addendum)
 Medication Instructions:  Your physician recommends that you continue on your current medications as directed. Please refer to the Current Medication list given to you today.   Labwork: NONE  Testing/Procedures: NONE  Follow-Up: 2 MONTHS WITH DR [place] OR CAITLIN W NP   Any Other Special Instructions Will Be Listed Below (If Applicable).  If your blood pressure continues to be elevated there are two medications left to try that you have not yet tried in the past:  1. Aliskiren 2. Minoxidil  MONITOR YOUR BLOOD PRESSURE TWICE A DAY, LOG IN THE BOOK PROVIDED. BRING THE BOOK AND YOUR BLOOD PRESSURE MACHINE TO YOUR FOLLOW UP

## 2024-08-09 NOTE — Progress Notes (Signed)
 [facility] Initial Assessment:    Date:  08/09/2024   ID:  Anne [name], DOB Aug 22, 1946, MRN [id number]  PCP:  Dwight Trula SQUIBB, MD  Cardiologist:  Shelda Bruckner, MD   Referring MD: Dwight Trula SQUIBB, MD   CC: Hypertension  History of Present Illness:    Anne [name] is a 78 y.o. female with a hx of CAD status post NSTEMI 03/2020, LAD PCI, hyperlipidemia, hypertension, cerebellar CVA, and Alzheimer's dementia here to establish care in the [facility].  She sees Dr. Bruckner in cardiology clinic.  She had an NSTEMI in 2021 and underwent PCI of the LAD.  She had otherwise nonobstructive disease.  Echo at the time revealed LVEF 55-60% with mild LVH and grade 1 diastolic dysfunction.  She struggles with intolerance to multiple medications for her blood pressure as listed below.  Renal Dopplers 08/2012 were negative for renal artery stenosis. She last saw Dr. Bruckner 05/2022 and was most concerned about her dementia.  She was frustrated that despite lifestyle modifications blood pressure and cholesterol remain above goal.  At that visit blood pressure was 164/86.  At the time she was only taking HCTZ/triamterene .  She has a history of hyperaldosteronism but declined restarting spironolactone .  It was recommended she start a PCSK9 inhibitor and she wanted to think about it.  MRI of the abdomen 09/2021 revealed benign appearing cyst of the pancreas and a 1.6 cm renal cyst but no adrenal masses.  Aldosterone level was 8 01/2023.  Normetanephrine's and plasma metanephrines were mildly elevated in 2024.  She had a NM PET brain 12/2023 that was positive for Alzheimer's.   Discussed the use of AI scribe software for clinical note transcription with the patient, who gave verbal consent to proceed.  History of Present Illness Anne [name] is a 78 year old female with hypertension who presents for management of her blood pressure. She was referred by Dr.  Bruckner for management of her hypertension.  She  reports difficulty managing her blood pressure because she is very sensitive to medications. She has a history of adverse reactions to various medications, including a significant event in 2010 when she collapsed due to hypokalemia while on hydrochlorothiazide  and triamterene . Currently, she is taking hydrochlorothiazide  and triamterene  37.5/25 mg, which was reintroduced after a long hiatus.  Recently, the patient noted her blood pressure was much improved with exercise and diet, typically around 132/78 mmHg, but it spiked to 176/100 mmHg after consuming a high-salt diet over Thanksgiving. She is extremely salt sensitive and has been drinking a lot of water to help reduce her blood pressure. She exercises almost daily, walking with her husband.  She avoids caffeine and alcohol, only occasionally drinking a small amount of champagne on New Year's. She takes multiple vitamins, carnivore supplements, and [place] olive oil, which she believes may help with cholesterol and blood pressure. No snoring, according to her husband.  She has a family history of heart disease; her mother had a heart attack and a stroke but made a full recovery. She did not have blood pressure issues during her pregnancies but did experience hemorrhaging post-delivery and had a hysterectomy after her second child due to a cyst.  Previous antihypertensives: Nebivolol  Carvedilol  Diltiazem Valsartan Hydralazine  Metoprolol  HCTZ/triamterene  Amlodipine Lisinopril  Losartan  Doxazosin   Eplerenone Spironolactone  Clonidine  patch Imdur  Amiloride   Past Medical History:  Diagnosis Date   Complication of anesthesia    slow to wake up   Diverticulosis    Hiatal hernia  Hypercholesterolemia    Hypertension    Osteopenia    Type 2 diabetes mellitus with stage 3a chronic kidney disease, without long-term current use of insulin  (HCC) 12/05/2021   Vitamin D  deficiency      Past Surgical History:  Procedure Laterality Date   ABDOMINAL HYSTERECTOMY     Partial   AUGMENTATION MAMMAPLASTY     BREAST BIOPSY Left 1975   BREAST BIOPSY Right 1975   CORONARY STENT INTERVENTION N/A 03/16/2020   Procedure: CORONARY STENT INTERVENTION;  Surgeon: Verlin Lonni BIRCH, MD;  Location: [facility];  Service: Cardiovascular;  Laterality: N/A;   fibrocystic breast excision     LAPAROSCOPIC ILEOCECECTOMY  07/07/2015   LAPAROTOMY N/A 07/07/2015   Procedure: EXPLORATORY LAPAROTOMY WITH ILEOCECECTOMY ;  Surgeon: Donnice Lima, MD;  Location: [facility];  Service: General;  Laterality: N/A;   LEFT HEART CATH AND CORONARY ANGIOGRAPHY N/A 03/16/2020   Procedure: LEFT HEART CATH AND CORONARY ANGIOGRAPHY;  Surgeon: Verlin Lonni BIRCH, MD;  Location: [facility];  Service: Cardiovascular;  Laterality: N/A;   TUBAL LIGATION      Current Medications: Current Meds  Medication Sig   alendronate (FOSAMAX) 70 MG tablet Take by mouth.   ASPIRIN  LOW DOSE 81 MG tablet TAKE 1 TABLET (81 MG TOTAL) BY MOUTH DAILY. SWALLOW WHOLE. (Patient taking differently: Take 81 mg by mouth once.)   Cod Liver Oil OIL Take 5 mLs by mouth daily.   donepezil  (ARICEPT ) 10 MG tablet START 1 TABLET DAILY AT BEDTIME WITH FOOD X 4 WEEKS AND THEN 2 TABLETS DAILY AT BEDTIME WITH FOOD   ezetimibe (ZETIA) 10 MG tablet Take 10 mg by mouth daily.   Multiple Vitamin (MULTIVITAMIN) LIQD Take 5 mLs by mouth daily.   nitroGLYCERIN  (NITROSTAT ) 0.4 MG SL tablet Place 1 tablet (0.4 mg total) under the tongue every 5 (five) minutes as needed for chest pain. May repeat 2 times, if 3rd dose needed call 911   triamterene -hydrochlorothiazide  (MAXZIDE-25) 37.5-25 MG tablet TAKE 1 TABLET BY MOUTH EVERY DAY     Allergies:   Amiloride ; Anesthetics, amide; Other; Prilocaine; Amlodipine besylate; Augmentin  [amoxicillin -pot clavulanate]; Cefdinir ; Diltiazem hcl; Diltiazem hcl; Eplerenone; Indomethacin; Naproxen ;  Tenex [guanfacine hcl]; Triamterene -hctz; Cephalexin; Hydralazine  hcl; Isosorbide  nitrate; Nebivolol  hcl; Potassium chloride ; Spironolactone ; Valsartan; Hytrin [terazosin hcl]; and Lisinopril    Social History   Socioeconomic History   Marital status: Married    Spouse name: Not on file   Number of children: 2   Years of education: Not on file   Highest education level: Not on file  Occupational History   Occupation: retired  Tobacco Use   Smoking status: Never   Smokeless tobacco: Never  Vaping Use   Vaping status: Never Used  Substance and Sexual Activity   Alcohol use: Yes    Comment: 3 times a year   Drug use: No   Sexual activity: Yes    Birth control/protection: Surgical  Other Topics Concern   Not on file  Social History Narrative   Not on file   Social Drivers of Health   Financial Resource Strain: Low Risk  (08/09/2024)   Overall Financial Resource Strain (CARDIA)    Difficulty of Paying Living Expenses: Not hard at all  Food Insecurity: No Food Insecurity (08/09/2024)   Hunger Vital Sign    Worried About Running Out of Food in the Last Year: Never true    Ran Out of Food in the Last Year: Never true  Transportation Needs: No Transportation  Needs (08/09/2024)   PRAPARE - Administrator, Civil Service (Medical): No    Lack of Transportation (Non-Medical): No  Physical Activity: Sufficiently Active (08/09/2024)   Exercise Vital Sign    Days of Exercise per Week: 7 days    Minutes of Exercise per Session: 50 min  Stress: Stress Concern Present (08/09/2024)   [employer] of Occupational Health - Occupational Stress Questionnaire    Feeling of Stress: To some extent  Social Connections: Socially Integrated (08/09/2024)   Social Connection and Isolation Panel    Frequency of Communication with Friends and Family: More than three times a week    Frequency of Social Gatherings with Friends and Family: Once a week    Attends Religious Services: More than  4 times per year    Active Member of [employer] or Organizations: Yes    Attends Engineer, Structural: More than 4 times per year    Marital Status: Married     Family History: The patient's family history includes Heart attack in her mother; Hypertension in her mother; Stroke in her brother and mother. There is no history of Cancer, Alcohol abuse, Early death, Hearing loss, Heart disease, Hyperlipidemia, or Kidney disease.  ROS:   Please see the history of present illness.     All other systems reviewed and are negative.  EKGs/Labs/Other Studies Reviewed:    EKG:  EKG is not ordered today.    Recent Labs: 12/23/2023: TSH 1.550   Recent Lipid Panel    Component Value Date/Time   CHOL 164 12/05/2021 0211   CHOL 206 (H) 03/28/2021 0844   TRIG 31 12/05/2021 0211   HDL 60 12/05/2021 0211   HDL 78 03/28/2021 0844   CHOLHDL 2.7 12/05/2021 0211   VLDL 6 12/05/2021 0211   LDLCALC 98 12/05/2021 0211   LDLCALC 118 (H) 03/28/2021 0844   LDLDIRECT 158.7 09/30/2013 1255    Physical Exam:   VS:  BP (!) 153/79 (BP Location: Left Arm)   Pulse 72   Ht 5' 4 (1.626 m)   Wt 129 lb 1.6 oz (58.6 kg)   SpO2 93%   BMI 22.16 kg/m  , BMI Body mass index is 22.16 kg/m. GENERAL:  Well appearing HEENT: Pupils equal round and reactive, fundi not visualized, oral mucosa unremarkable NECK:  No jugular venous distention, waveform within normal limits, carotid upstroke brisk and symmetric, no bruits, no thyromegaly LUNGS:  Clear to auscultation bilaterally HEART:  RRR.  PMI not displaced or sustained,S1 and S2 within normal limits, no S3, no S4, no clicks, no rubs, no murmurs ABD:  Flat, positive bowel sounds normal in frequency in pitch, no bruits, no rebound, no guarding, no midline pulsatile mass, no hepatomegaly, no splenomegaly EXT:  2 plus pulses throughout, no edema, no cyanosis no clubbing SKIN:  No rashes no nodules NEURO:  Cranial nerves II through XII grossly intact, motor grossly  intact throughout PSYCH:  Cognitively intact, oriented to person place and time   ASSESSMENT/PLAN:    Assessment & Plan # Primary hypertension:  # Multiple medication intolerances: Likely due to high salt intake. Blood pressure improved with increased water intake. Current medication is hydrochlorothiazide /triamterene . Previous adverse reactions to antihypertensives. Discussed minoxidil and aliskiren but deferred due to intolerance history. BP was pretty well-controlled with lifestyle until lately.  Emphasized home monitoring and lifestyle modifications.  Aldosterone levels were not consistent with hyperaldosteronism.  - Continue hydrochlorothiazide  and triamterene . - Encouraged exercise and reduced salt intake. - Provided  blood pressure tracking book for home monitoring. - Reassess blood pressure in a couple of months with home recordings. - Consider minoxidil or aliskiren if blood pressure remains uncontrolled.   Screening for Secondary Hypertension:     06/05/2023    4:19 PM 08/09/2024   11:05 AM  Causes  Drugs/Herbals  Screened     - Comments  No EtOH or caffeine.  Trying to limit sodium.  Renovascular HTN Screened Screened     - Comments 05/2023 renal duplex ordered Normal Dopplers in 2013.  Sleep Apnea  Screened     - Comments  nNo symptoms  Thyroid  Disease Screened Screened     - Comments 09/2022 normal TSH   Hyperaldosteronism Screened Screened     - Comments 02/2023 aldosterone 10 (indeterminite for hyperaldosteronism) with endocrinology while off Spironolactone . Unable to complete salt loading to confirm diagnosis due to markedly elevated BP.   Pheochromocytoma Screened N/A     - Comments 09/2021 normal adrenal glands   Cushing's Syndrome N/A N/A     - Comments non cushinoid appearance   Hyperparathyroidism  Screened  Coarctation of the Aorta N/A Screened     - Comments BP symmetrical. 2021 CT unremarkable aorta BP symmetric  Compliance Screened Screened    Relevant  Labs/Studies:    Latest Ref Rng & Units 06/09/2023   10:43 AM 05/28/2023    3:36 PM 02/25/2023    8:36 AM  Basic Labs  Sodium 135 - 145 mmol/L 138  135  139   Potassium 3.5 - 5.1 mmol/L 4.7  4.0  3.5   Creatinine 0.44 - 1.00 mg/dL 9.04  8.95  9.06        Latest Ref Rng & Units 12/23/2023    3:19 PM 03/01/2021    4:29 PM  Thyroid    TSH 0.450 - 4.500 uIU/mL 1.550  1.960          Latest Ref Rng & Units 01/14/2023   11:40 AM  Metanephrines/Catecholamines   Metanephrines <=57 pg/mL 25   Normetanephrines  <=148 pg/mL 200        Latest Ref Rng & Units 01/14/2023   11:40 AM 12/05/2021    4:28 PM  Cortisol  Cortisol  ug/dL 7.6  3.8        89/78/7975    9:02 AM  Renovascular   Renal Artery US  Completed Yes     Disposition:    FU with MD/PharmD in 2 months    Medication Adjustments/Labs and Tests Ordered: Current medicines are reviewed at length with the patient today.  Concerns regarding medicines are outlined above.  No orders of the defined types were placed in this encounter.  No orders of the defined types were placed in this encounter.   Signed, Annabella Scarce, MD  08/09/2024 11:27 AM    [facility]

## 2024-08-23 ENCOUNTER — Ambulatory Visit: Admitting: Plastic Surgery

## 2024-08-23 ENCOUNTER — Encounter: Payer: Self-pay | Admitting: Plastic Surgery

## 2024-08-23 VITALS — BP 159/79 | HR 78 | Ht 64.0 in | Wt 130.9 lb

## 2024-08-23 DIAGNOSIS — T8544XA Capsular contracture of breast implant, initial encounter: Secondary | ICD-10-CM

## 2024-08-24 ENCOUNTER — Encounter: Payer: Self-pay | Admitting: Plastic Surgery

## 2024-08-24 NOTE — Progress Notes (Signed)
 Referring Provider Dwight Trula SQUIBB, MD [address] [place],  [place] [address]   CC:  Chief Complaint  Patient presents with   consult      Anne [name] is an 78 y.o. female.  HPI: Ms. Perea is a 78 year old female who presents today for evaluation of her aesthetic breast augmentation.  She states that she had implants placed in 2017.  From the initial placement she was unhappy.  She felt that the left implant was larger than the right.  Since placement she has noted increasing hardness around the left breast implant and feels that the implant sits much higher than the right implant.  She also notes pain in the breast which she feels is also contributing to back pain.  She states that she feels her pain is also contributing to her elevated blood pressure.  She would like to have revision of the left breast to achieve better symmetry.  She would like to have the implant replaced and the capsule removed.  She does have a significant cardiac history and has been diagnosed with early onset Alzheimer's for which she has intermittently taking Aricept .  Allergies[1]  Outpatient Encounter Medications as of 08/23/2024  Medication Sig   nitroGLYCERIN  (NITROSTAT ) 0.4 MG SL tablet Place 1 tablet (0.4 mg total) under the tongue every 5 (five) minutes as needed for chest pain. May repeat 2 times, if 3rd dose needed call 911   alendronate (FOSAMAX) 70 MG tablet Take by mouth.   ASPIRIN  LOW DOSE 81 MG tablet TAKE 1 TABLET (81 MG TOTAL) BY MOUTH DAILY. SWALLOW WHOLE. (Patient taking differently: Take 81 mg by mouth once.)   Cod Liver Oil OIL Take 5 mLs by mouth daily.   donepezil  (ARICEPT ) 10 MG tablet START 1 TABLET DAILY AT BEDTIME WITH FOOD X 4 WEEKS AND THEN 2 TABLETS DAILY AT BEDTIME WITH FOOD   ezetimibe (ZETIA) 10 MG tablet Take 10 mg by mouth daily.   gabapentin (NEURONTIN) 100 MG capsule Take 100 mg by mouth 2 (two) times daily. (Patient not taking: Reported on 08/09/2024)    Multiple Vitamin (MULTIVITAMIN) LIQD Take 5 mLs by mouth daily.   tiZANidine (ZANAFLEX) 4 MG capsule 1 tablet at bedtime as needed Orally Once a day; Duration: 10 days (Patient not taking: Reported on 08/09/2024)   triamterene -hydrochlorothiazide  (MAXZIDE-25) 37.5-25 MG tablet TAKE 1 TABLET BY MOUTH EVERY DAY   No facility-administered encounter medications on file as of 08/23/2024.     Past Medical History:  Diagnosis Date   Complication of anesthesia    slow to wake up   Diverticulosis    Hiatal hernia    Hypercholesterolemia    Hypertension    Osteopenia    Type 2 diabetes mellitus with stage 3a chronic kidney disease, without long-term current use of insulin  (HCC) 12/05/2021   Vitamin D  deficiency     Past Surgical History:  Procedure Laterality Date   ABDOMINAL HYSTERECTOMY     Partial   AUGMENTATION MAMMAPLASTY     BREAST BIOPSY Left 1975   BREAST BIOPSY Right 1975   CORONARY STENT INTERVENTION N/A 03/16/2020   Procedure: CORONARY STENT INTERVENTION;  Surgeon: Verlin Lonni BIRCH, MD;  [facility]: [facility];  Service: Cardiovascular;  Laterality: N/A;   fibrocystic breast excision     LAPAROSCOPIC ILEOCECECTOMY  07/07/2015   LAPAROTOMY N/A 07/07/2015   Procedure: EXPLORATORY LAPAROTOMY WITH ILEOCECECTOMY ;  Surgeon: Donnice Lima, MD;  Location: [facility];  Service: General;  Laterality: N/A;   LEFT HEART CATH AND CORONARY ANGIOGRAPHY N/A 03/16/2020   Procedure: LEFT HEART CATH AND CORONARY ANGIOGRAPHY;  Surgeon: Verlin Lonni BIRCH, MD;  [facility]: [facility];  Service: Cardiovascular;  Laterality: N/A;   TUBAL LIGATION      Family History  Problem Relation Age of Onset   Heart attack Mother    Stroke Mother    Hypertension Mother    Stroke Brother    Cancer Neg Hx    Alcohol abuse Neg Hx    Early death Neg Hx    Hearing loss Neg Hx    Heart disease Neg Hx    Hyperlipidemia Neg Hx    Kidney disease Neg Hx     Social History   Social  History Narrative   Not on file     Review of Systems General: Denies fevers, chills, weight loss CV: Denies chest pain, shortness of breath, palpitations Breast: Patient complains of breast asymmetry as well as left breast pain  Physical Exam    08/23/2024   11:38 AM 08/09/2024   10:42 AM 08/09/2024   10:37 AM  Vitals with BMI  Height 5' 4  5' 4  Weight 130 lbs 14 oz  129 lbs 2 oz  BMI 22.46  22.15  Systolic 159 153 846  Diastolic 79 79 84  Pulse 78 72     General:  No acute distress,  Alert and oriented, Non-Toxic, Normal speech and affect Breast: Patient has palpable intact saline implants bilaterally.  The right has a more natural ptosis.  The left has a firm capsule with significant displacement of the implant superiorly I do not palpate any significant masses. Mammogram: Last documented mammogram was in 2022 and was BI-RADS 1 Assessment/Plan Status post aesthetic breast augmentation, left capsular contracture with pain: Patient will be a medically challenging patient to care for.  She feels that replacement of her implant and management of her capsular contraction should be covered by insurance due to pain.  I have explained to her that I do not think that this is the case however we will submit to insurance if she likes.  Under no circumstance will we proceed with surgery until she has clearances from cardiology neurology and her PCP.  She will need to be off her aspirin  and her cod liver oil.  I believe that she is at a very high risk for recurrence of the capsular contracture.  Due to the thinness of her skin she is also at a very high risk for complications such as skin breakdown wound breakdown.  Photographs were obtained today with her consent.  Will submit her for capsulectomy and removal and replacement of her left breast implant.  Leonce KATHEE [place] 08/24/2024, 3:25 PM         [1]  Allergies Allergen Reactions   Amiloride  Other (See Comments)    Hair loss    Anesthetics, Amide Other (See Comments)    Lethargic and very slow reanimation lasting a full day or so   Other Other (See Comments)    Very hard to awake from anaesthetic with surgery. Acute renal failure, hypokalemia Very hard to awake from anaesthetic with surgery.   Prilocaine Other (See Comments)    Lethargic and very slow reanimation lasting a full day or so   Amlodipine Besylate Swelling    Severe edema / Headaches   Augmentin  [Amoxicillin -Pot Clavulanate] Other (See Comments)    Pain in hips and legs   Cefdinir  Nausea  And Vomiting    Other Reaction(s): diarrhea and stomach upset   Diltiazem Hcl Nausea And Vomiting    Hospitalized when it occurred    Diltiazem Hcl Nausea And Vomiting and Nausea Only    Hospitalized when it occurred  Other Reaction(s): severe fatigue   Eplerenone Other (See Comments)    Hair loss   Indomethacin Other (See Comments)    esophagitis   Naproxen  Other (See Comments)    Gastric intolerance   Tenex [Guanfacine Hcl] Other (See Comments)    Chronic insomnia    Triamterene -Hctz Other (See Comments)    Acute renal failure, hypokalemia    Cephalexin Other (See Comments) and Nausea And Vomiting   Hydralazine  Hcl Other (See Comments)    Fluid retention   Isosorbide  Nitrate     Elevated blood pressure, neck cramps, headache, dizziness, blurred vision  Other Reaction(s): neck pain, dizziness, High BP   Nebivolol  Hcl     Other reaction(s): disoriented  Other Reaction(s): disoriented   Potassium Chloride  Other (See Comments)    Severe hair loss, baldness   Spironolactone  Other (See Comments)    hyperkalemia   Valsartan Other (See Comments)    Unable to remember reaction  Other Reaction(s): GI distress   Hytrin [Terazosin Hcl] Other (See Comments)    Pt states she can not take this med but can not relate any side effect or incident that occurred   Lisinopril  Other (See Comments)    Unknown, pt says she can't take it, cough/hypotension and  thirst  Other Reaction(s): cough

## 2024-09-07 ENCOUNTER — Telehealth: Payer: Self-pay | Admitting: Plastic Surgery

## 2024-09-07 NOTE — Telephone Encounter (Signed)
 Pt called and stated that she was instructed to call us  and have Dr Waddell put in an order for a diagnostic bilateral mammo with US  for L breast, ASAP.

## 2024-09-14 ENCOUNTER — Emergency Department (HOSPITAL_COMMUNITY)
Admission: EM | Admit: 2024-09-14 | Discharge: 2024-09-14 | Disposition: A | Discharge status: Home / Self Care | Source: Ambulatory Visit | Attending: Emergency Medicine | Admitting: Emergency Medicine

## 2024-09-14 ENCOUNTER — Emergency Department (HOSPITAL_COMMUNITY)

## 2024-09-14 ENCOUNTER — Other Ambulatory Visit: Payer: Self-pay

## 2024-09-14 ENCOUNTER — Encounter (HOSPITAL_COMMUNITY): Payer: Self-pay

## 2024-09-14 DIAGNOSIS — R079 Chest pain, unspecified: Secondary | ICD-10-CM | POA: Insufficient documentation

## 2024-09-14 DIAGNOSIS — R202 Paresthesia of skin: Secondary | ICD-10-CM | POA: Insufficient documentation

## 2024-09-14 DIAGNOSIS — Z7982 Long term (current) use of aspirin: Secondary | ICD-10-CM | POA: Insufficient documentation

## 2024-09-14 DIAGNOSIS — N644 Mastodynia: Secondary | ICD-10-CM | POA: Insufficient documentation

## 2024-09-14 DIAGNOSIS — M546 Pain in thoracic spine: Secondary | ICD-10-CM | POA: Insufficient documentation

## 2024-09-14 DIAGNOSIS — M545 Low back pain, unspecified: Secondary | ICD-10-CM | POA: Insufficient documentation

## 2024-09-14 LAB — CBC WITH DIFFERENTIAL/PLATELET
Abs Immature Granulocytes: 0.01 K/uL (ref 0.00–0.07)
Basophils Absolute: 0 K/uL (ref 0.0–0.1)
Basophils Relative: 1 %
Eosinophils Absolute: 0 K/uL (ref 0.0–0.5)
Eosinophils Relative: 1 %
HCT: 32.9 % — ABNORMAL LOW (ref 36.0–46.0)
Hemoglobin: 11.4 g/dL — ABNORMAL LOW (ref 12.0–15.0)
Immature Granulocytes: 0 %
Lymphocytes Relative: 39 %
Lymphs Abs: 1.5 K/uL (ref 0.7–4.0)
MCH: 30.3 pg (ref 26.0–34.0)
MCHC: 34.7 g/dL (ref 30.0–36.0)
MCV: 87.5 fL (ref 80.0–100.0)
Monocytes Absolute: 0.3 K/uL (ref 0.1–1.0)
Monocytes Relative: 8 %
Neutro Abs: 1.9 K/uL (ref 1.7–7.7)
Neutrophils Relative %: 51 %
Platelets: 182 K/uL (ref 150–400)
RBC: 3.76 MIL/uL — ABNORMAL LOW (ref 3.87–5.11)
RDW: 14.2 % (ref 11.5–15.5)
WBC: 3.8 K/uL — ABNORMAL LOW (ref 4.0–10.5)
nRBC: 0 % (ref 0.0–0.2)

## 2024-09-14 LAB — COMPREHENSIVE METABOLIC PANEL WITH GFR
ALT: 16 U/L (ref 0–44)
AST: 28 U/L (ref 15–41)
Albumin: 4.2 g/dL (ref 3.5–5.0)
Alkaline Phosphatase: 68 U/L (ref 38–126)
Anion gap: 12 (ref 5–15)
BUN: 13 mg/dL (ref 8–23)
CO2: 24 mmol/L (ref 22–32)
Calcium: 9.3 mg/dL (ref 8.9–10.3)
Chloride: 103 mmol/L (ref 98–111)
Creatinine, Ser: 0.89 mg/dL (ref 0.44–1.00)
GFR, Estimated: >60 mL/min
Glucose, Bld: 91 mg/dL (ref 70–99)
Potassium: 3.7 mmol/L (ref 3.5–5.1)
Sodium: 139 mmol/L (ref 135–145)
Total Bilirubin: 0.7 mg/dL (ref 0.0–1.2)
Total Protein: 7.4 g/dL (ref 6.5–8.1)

## 2024-09-14 LAB — TROPONIN T, HIGH SENSITIVITY
Troponin T High Sensitivity: <15 ng/L (ref 0–19)
Troponin T High Sensitivity: <15 ng/L (ref 0–19)

## 2024-09-14 MED ORDER — OXYCODONE-ACETAMINOPHEN 5-325 MG PO TABS
1.0000 | ORAL_TABLET | Freq: Once | ORAL | Status: AC
  Administered 2024-09-14: 1 via ORAL
  Filled 2024-09-14: qty 1

## 2024-09-14 MED ORDER — OXYCODONE-ACETAMINOPHEN 5-325 MG PO TABS: 1.0000 | ORAL_TABLET | Freq: Three times a day (TID) | ORAL | 0 refills | Status: AC | PRN

## 2024-09-14 MED ORDER — IOHEXOL 350 MG/ML SOLN
75.0000 mL | Freq: Once | INTRAVENOUS | Status: AC | PRN
  Administered 2024-09-14: 75 mL via INTRAVENOUS

## 2024-09-14 NOTE — ED Notes (Addendum)
 PT was discharged with husband

## 2024-09-14 NOTE — ED Triage Notes (Signed)
 Pt reports she has pain in her left breast ongoing for months, has a breast implant. Also c/o back pain that started yesterday and has progressively increased. Reports last time she had back pain like this she was having a heart attack.

## 2024-09-14 NOTE — ED Provider Notes (Signed)
 " [place] [facility]ENC[facility] DE[facility]MENT AT [facility] Provider Note   CSN: [id number] Arrival date & time: 09/14/24  1034     Patient presents with: Back Pain (/) and Breast Pain   Anne [name] is a 79 y.o. female.    Back Pain    Pt states she he has been having a few things.  She has noticed numbness in her left hand.  She has noticed it for a couple of days.  It has been increasing in severity.  She also has been having pains in the middle of her back.  That also started a few days ago.  That pain has also increased.  Pt states she had a heart attack in the past and this felt similar so this concerned her.  Pt also has been having pain in her left breast.  That has been painful for at least a year.  Pt did see her engineer, petroleum.  SHe was told she was going to get a mammogram with an ultrasound.  Prior to Admission medications  Medication Sig Start Date End Date Taking? Authorizing Provider  oxyCODONE -acetaminophen  (PERCOCET/ROXICET) 5-325 MG tablet Take 1 tablet by mouth every 8 (eight) hours as needed for severe pain (pain score 7-10). 09/14/24  Yes Randol Simmonds, MD  alendronate (FOSAMAX) 70 MG tablet Take by mouth. 11/25/22   [provider]  ASPIRIN  LOW DOSE 81 MG tablet TAKE 1 TABLET (81 MG TOTAL) BY MOUTH DAILY. SWALLOW WHOLE. Patient taking differently: Take 81 mg by mouth once. 12/18/23   Lonni Slain, MD  [facility] Liver Oil OIL Take 5 mLs by mouth daily.    [provider]  donepezil  (ARICEPT ) 10 MG tablet START 1 TABLET DAILY AT BEDTIME WITH FOOD X 4 WEEKS AND THEN 2 TABLETS DAILY AT BEDTIME WITH FOOD 04/01/24   Sethi, Pramod S, MD  ezetimibe (ZETIA) 10 MG tablet Take 10 mg by mouth daily. 12/23/23   [provider]  gabapentin (NEURONTIN) 100 MG capsule Take 100 mg by mouth 2 (two) times daily. Patient not taking: Reported on 08/09/2024 03/05/24   [provider]  Multiple Vitamin (MULTIVITAMIN) LIQD Take 5 mLs by mouth daily.     [provider]  nitroGLYCERIN  (NITROSTAT ) 0.4 MG SL tablet Place 1 tablet (0.4 mg total) under the tongue every 5 (five) minutes as needed for chest pain. May repeat 2 times, if 3rd dose needed call 911 06/04/23 08/23/24  Lonni Slain, MD  tiZANidine (ZANAFLEX) 4 MG capsule 1 tablet at bedtime as needed Orally Once a day; Duration: 10 days Patient not taking: Reported on 08/09/2024 06/18/24   [provider]  triamterene -hydrochlorothiazide  (MAXZIDE-25) 37.5-25 MG tablet TAKE 1 TABLET BY MOUTH EVERY DAY 08/09/24   Lonni Slain, MD    Allergies: Amiloride ; Anesthetics, amide; Other; Prilocaine; Amlodipine besylate; Augmentin  [amoxicillin -pot clavulanate]; Cefdinir ; Diltiazem hcl; Diltiazem hcl; Eplerenone; Indomethacin; Naproxen ; Tenex [guanfacine hcl]; Triamterene -hctz; Cephalexin; Hydralazine  hcl; Isosorbide  nitrate; Nebivolol  hcl; Potassium chloride ; Spironolactone ; Valsartan; Hytrin [terazosin hcl]; and Lisinopril     Review of Systems  Musculoskeletal:  Positive for back pain.    Updated Vital Signs BP (!) 169/81 (BP Location: Right Arm)   Pulse 66   Temp 98.3 F (36.8 C) (Oral)   Resp 10   Wt 55.8 kg   SpO2 100%   BMI 21.11 kg/m   Physical Exam  (all labs ordered are listed, but only abnormal results are displayed) Labs Reviewed  CBC WITH DIFFERENTIAL/PLATELET - Abnormal; Notable for the following components:  Result Value   WBC 3.8 (*)    RBC 3.76 (*)    Hemoglobin 11.4 (*)    HCT 32.9 (*)    All other components within normal limits  COMPREHENSIVE METABOLIC PANEL WITH GFR  TROPONIN T, HIGH SENSITIVITY  TROPONIN T, HIGH SENSITIVITY    EKG: EKG Interpretation Date/Time:  Tuesday September 14 2024 12:51:12 EST Ventricular Rate:  80 PR Interval:  172 QRS Duration:  126 QT Interval:  412 QTC Calculation: 475 R Axis:   -42  Text Interpretation: Normal sinus rhythm Left axis deviation Right bundle branch block Possible Inferior  infarct , age undetermined Abnormal ECG When compared with ECG of 01-Jun-2024 08:32, No significant change since last tracing Confirmed by Randol Simmonds [phone]) on 09/14/2024 6:21:44 PM  Radiology: CT Angio Chest PE W and/or Wo Contrast Result Date: 09/14/2024 EXAM: CTA CHEST 09/14/2024 09:06:00 PM TECHNIQUE: CTA of the chest was performed without and with the administration of 75 mL of iohexol  (OMNIPAQUE ) 350 MG/ML intravenous contrast. Multiplanar reformatted images are provided for review. MIP images are provided for review. Automated exposure control, iterative reconstruction, and/or weight based adjustment of the mA/kV was utilized to reduce the radiation dose to as low as reasonably achievable. COMPARISON: 03/25/2020 CLINICAL HISTORY: Pulmonary embolism (PE) suspected, high prob. FINDINGS: PULMONARY ARTERIES: Pulmonary arteries are adequately opacified for evaluation. No acute pulmonary embolus. Main pulmonary artery is normal in caliber. MEDIASTINUM: Diffuse coronary artery disease. Aortic atherosclerosis. The heart and pericardium demonstrate no acute abnormality. There is no acute abnormality of the thoracic aorta. LYMPH NODES: No mediastinal, hilar or axillary lymphadenopathy. LUNGS AND PLEURA: Minimal bibasilar atelectasis. No focal consolidation or pulmonary edema. No evidence of pleural effusion or pneumothorax. UPPER ABDOMEN: Limited images of the upper abdomen are unremarkable. SOFT TISSUES AND BONES: Bilateral breast implants, grossly unremarkable. No acute bone or soft tissue abnormality. IMPRESSION: 1. No pulmonary embolism. 2. Minimal bibasilar atelectasis. 3. Diffuse coronary artery disease, aortic atherosclerosis. Electronically signed by: Franky Crease MD 09/14/2024 09:14 PM EST RP Workstation: [id number]   DG Chest 1 View Result Date: 09/14/2024 CLINICAL DATA:  Left breast implant presenting with left-sided breast pain for months. EXAM: CHEST  1 VIEW COMPARISON:  None Available. FINDINGS: The  heart size and mediastinal contours are within normal limits. Marked severity calcification of the aortic arch is seen. There is an area of mild diffuse hazy opacification overlying the mid left lung. No pleural effusion or pneumothorax is identified. There is mild dextroscoliosis of the lower thoracic spine. The visualized skeletal structures are otherwise unremarkable. IMPRESSION: Hazy opacification overlying the mid left lung which may be related to the patient's overlying left breast implant. Correlation with chest CT is recommended as underlying airspace disease cannot be excluded. Electronically Signed   By: Suzen Dials M.D.   On: 09/14/2024 13:56   CT Head Wo Contrast Result Date: 09/14/2024 CLINICAL DATA:  Headache and dizziness. EXAM: CT HEAD WITHOUT CONTRAST TECHNIQUE: Contiguous axial images were obtained from the base of the skull through the vertex without intravenous contrast. RADIATION DOSE REDUCTION: This exam was performed according to the departmental dose-optimization program which includes automated exposure control, adjustment of the mA and/or kV according to patient size and/or use of iterative reconstruction technique. COMPARISON:  June 09, 2023 FINDINGS: Brain: There is generalized cerebral atrophy with widening of the extra-axial spaces and ventricular dilatation. There are areas of decreased attenuation within the white matter tracts of the supratentorial brain, consistent with microvascular disease changes. Vascular: No hyperdense vessel  or unexpected calcification. Skull: Normal. Negative for fracture or focal lesion. Sinuses/Orbits: There is moderate to marked severity right ethmoid sinus mucosal thickening. Other: None. IMPRESSION: 1. Generalized cerebral atrophy and microvascular disease changes of the supratentorial brain. 2. Moderate to marked severity right ethmoid sinus disease. Electronically Signed   By: Suzen Dials M.D.   On: 09/14/2024 13:54      Procedures   Medications Ordered in the ED  oxyCODONE -acetaminophen  (PERCOCET/ROXICET) 5-325 MG per tablet 1 tablet (1 tablet Oral Given 09/14/24 1847)  iohexol  (OMNIPAQUE ) 350 MG/ML injection 75 mL (75 mLs Intravenous Contrast Given 09/14/24 2111)    Clinical Course as of 09/14/24 2135  Tue Sep 14, 2024  2127 CBC metabolic panel normal.  Troponin is normal. [JK]  2128 CT Head Wo Contrast Head CT without acute finding [JK]  2128 DG Chest 1 View Chest x-ray showed possible infiltrate CT scan recommended [JK]  2128 CT Angio Chest PE W and/or Wo Contrast CT angiogram without signs of PE or pulmonary infiltrate [JK]    Clinical Course User Index [JK] Randol Simmonds, MD                                 Medical Decision Making Problems Addressed: Acute low back pain without sciatica, unspecified back pain laterality: acute illness or injury Breast pain in female: acute illness or injury Chest pain, unspecified type: acute illness or injury  Amount and/or Complexity of Data Reviewed Labs: ordered. Decision-making details documented in ED Course. Radiology: ordered and independent interpretation performed. Decision-making details documented in ED Course.  Risk Prescription drug management.   Patient presented to the ED with several complaints.  Patient complained of ongoing breast pain associated with the breast implant.  Patient was hoping she could possibly get a mammogram here today.  I did review the patient's records.  She did see a engineer, petroleum on December 15.  Patient was noted to have breast pain and was felt to have symptoms associated with capsular contraction.  Per the notes there are plans for continued outpatient evaluation but patient will require medical clearance.  Patient also with complaints of paresthesia in her hand as well as some lower back pain.  Patient's ED workup is reassuring.  She does not have any focal neurologic deficits.  Her CT scan does not show any  acute abnormality.  Her labs do not show any signs of anemia or electrolyte disturbance.  Doubt cardiac etiology for her chest pain.  Her troponins are normal.  EKG is reassuring.  She also does not have any evidence of PE or pneumonia on her CT angiogram.  Patient was given a dose of pain medication.  She states she is feeling better at this time.  Recommend continued outpatient evaluation with Dr. Waddell regarding her breast complaints.  Evaluation and diagnostic testing in the emergency department does not suggest an emergent condition requiring admission or immediate intervention beyond what has been performed at this time.  The patient is safe for discharge and has been instructed to return immediately for worsening symptoms, change in symptoms or any other concerns.    Final diagnoses:  Breast pain in female  Acute low back pain without sciatica, unspecified back pain laterality    ED Discharge Orders          Ordered    oxyCODONE -acetaminophen  (PERCOCET/ROXICET) 5-325 MG tablet  Every 8 hours PRN  09/14/24 2135               Randol Simmonds, MD 09/14/24 2139  "

## 2024-09-14 NOTE — Discharge Instructions (Addendum)
 The test today in the ED were reassuring.  You do not have any signs of a heart attack ,blood clot ,or aneurysm.  You can take the medications to help with pain and discomfort.  Follow-up with your primary care doctor and breast surgeon to be rechecked

## 2024-09-14 NOTE — ED Notes (Signed)
 PT in bathroom

## 2024-09-14 NOTE — ED Notes (Signed)
 Called CT about PT

## 2024-09-14 NOTE — ED Provider Triage Note (Signed)
 Emergency Medicine Provider Triage Evaluation Note  Anne [name] , a 79 y.o. female  was evaluated in triage.  Pt complains of pain to the upper back increasing for the past 2 weeks.  Notes worse over the past 2 to 3 days.  States this is what she felt like when she had a heart attack several years ago.  Also notes a headache and tingling in the left hand today.  Patient also complaining of left breast pain ongoing for the past year.  Notes implants in 2017.  Has had yearly mammograms with no abnormalities but was not able to get 1 a couple months ago due to the pain.  Denies fevers or redness.  No drainage.  Has been seen by plastic surgery.  Review of Systems  Positive: Back pain, headache Negative: Dizziness, syncope, chest pain, shortness of breath, abdominal pain, nausea/vomiting, fevers  Physical Exam  BP (!) 170/90 (BP Location: Left Arm)   Pulse 86   Temp 98 F (36.7 C)   Resp 17   Wt 55.8 kg   SpO2 100%   BMI 21.11 kg/m  Gen:   Awake, no distress   Resp:  Normal effort  MSK:   Moves extremities without difficulty  Other:  No other neurologic deficits  Medical Decision Making  Medically screening exam initiated at 1:15 PM.  Appropriate orders placed.  Etoile CRISTIE MCKINNEY was informed that the remainder of the evaluation will be completed by another provider, this initial triage assessment does not replace that evaluation, and the importance of remaining in the ED until their evaluation is complete.     Neysa Thersia GORMAN, PA-C 09/14/24 1316

## 2024-09-21 ENCOUNTER — Other Ambulatory Visit: Payer: Self-pay | Admitting: Internal Medicine

## 2024-09-21 DIAGNOSIS — N644 Mastodynia: Secondary | ICD-10-CM

## 2024-09-24 ENCOUNTER — Inpatient Hospital Stay: Admission: RE | Admit: 2024-09-24

## 2024-09-24 ENCOUNTER — Telehealth: Payer: Self-pay | Admitting: Plastic Surgery

## 2024-09-24 DIAGNOSIS — N644 Mastodynia: Secondary | ICD-10-CM

## 2024-09-24 NOTE — Telephone Encounter (Signed)
 Pt came into office wanting to switch from Dr Waddell to Dr Lowery, she wants a female provider. Pt already has a route in sx with Dr waddell, SX clearances already going out.  If pt calls back, Dr Lowery said she will do a televisit to talk with the patient to discuss a few things.

## 2024-09-27 ENCOUNTER — Telehealth (HOSPITAL_BASED_OUTPATIENT_CLINIC_OR_DEPARTMENT_OTHER): Payer: Self-pay | Admitting: *Deleted

## 2024-09-27 ENCOUNTER — Telehealth: Payer: Self-pay

## 2024-09-27 NOTE — Telephone Encounter (Signed)
 Dr. Raford,  You saw this patient on 08/09/2024. Per protocol we request that you comment on his cardiac risk to proceed with LEFT BREAST EXPLANT & REPLACEMENT WITH SALINE IMPLANT CAPSULECTOMY on TBD since it has been less than 2 months since evaluated in the office. Please send your comment to P CV Pre-Op Pool.  Thank you, Lamarr Satterfield DNP, ANP, AACC.

## 2024-09-27 NOTE — Telephone Encounter (Signed)
 Faxed the Req for Surgical Clearance form to patient's PCP, Neurologist, and Cardiologist,

## 2024-09-27 NOTE — Telephone Encounter (Signed)
"  ° °  Pre-operative Risk Assessment    Patient Name: STEPHENIA [name]  DOB: Jun 23, 1946 MRN: [id number]   Date of last office visit: 08/09/24 DR. Chewey Date of next office visit: 10/29/24 DR. [place] Hodge   Request for Surgical Clearance    Procedure:  LEFT BREAST EXPLANT & REPLACEMENT WITH SALINE IMPLANT CAPSULECTOMY   Date of Surgery:  Clearance TBD                                Surgeon:  DR. JACKSON TAYLOR Surgeon's Group or Practice Name:  [facility] Phone number:  [phone] Fax number:  [phone]   Type of Clearance Requested:   - Medical  - Pharmacy:  Hold Aspirin      Type of Anesthesia:  General    Additional requests/questions:    Anne [name]   09/27/2024, 12:00 PM   "

## 2024-09-28 ENCOUNTER — Encounter: Payer: Self-pay | Admitting: Plastic Surgery

## 2024-09-28 ENCOUNTER — Ambulatory Visit: Admitting: Plastic Surgery

## 2024-09-28 DIAGNOSIS — T8544XA Capsular contracture of breast implant, initial encounter: Secondary | ICD-10-CM | POA: Insufficient documentation

## 2024-09-28 DIAGNOSIS — N644 Mastodynia: Secondary | ICD-10-CM | POA: Insufficient documentation

## 2024-09-28 NOTE — Progress Notes (Signed)
" ° °  Subjective:    Patient ID: Anne [name], female    DOB: 1946-05-30, 79 y.o.   MRN: [id number]  The patient is a 79 year old female here for second opinion on her breasts.  She was seen in December with Dr. Waddell.  She is very uncomfortable with her implants.  She has quite a bit of discomfort and pain worse on the left.  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  She says the pain is constant and creates intermittent elevations of her blood pressure.  She has been given pain medicine that either does not help or that does not make her feel good.  It is painful in all positions particularly and laying down.  She has cardiac disease and a history of Alzheimer's.  She is on medications as listed below.  She has kids and husband whom she lives with.      Review of Systems  Constitutional:  Positive for activity change. Negative for appetite change.  Eyes: Negative.   Respiratory: Negative.    Cardiovascular: Negative.   Gastrointestinal: Negative.   Endocrine: Negative.   Genitourinary: Negative.   Musculoskeletal: Negative.   Skin: Negative.        Objective:   Physical Exam Constitutional:      Appearance: Normal appearance.  HENT:     Head: Atraumatic.  Cardiovascular:     Rate and Rhythm: Normal rate.     Pulses: Normal pulses.  Abdominal:     Palpations: Abdomen is soft.  Skin:    General: Skin is warm.     Capillary Refill: Capillary refill takes less than 2 seconds.  Neurological:     Mental Status: She is alert and oriented to person, place, and time.  Psychiatric:        Mood and Affect: Mood normal.        Behavior: Behavior normal.        Thought Content: Thought content normal.        Judgment: Judgment normal.           Assessment & Plan:     ICD-10-CM   1. Breast pain in female  N64.4     2. Capsular contracture of breast implant, initial encounter  T85.44XA         Recommend removal of both implants but patient will need to decide if she just wants the left out or not.  I do not recommend replacing the implants due to the pain.  It is most likely that this will recur again.  She will need cardiac clearance prior to the surgery.  She would like to submit to insurance.  I offered for her to go back to Dr. Waddell and she asked if I would do the procedure.   "

## 2024-09-30 ENCOUNTER — Telehealth: Payer: Self-pay | Admitting: *Deleted

## 2024-09-30 NOTE — Telephone Encounter (Signed)
 Received clearance request from plastic surgery specialist for L breast explant & replacement w/ saline implant, capsulectomy. Surgeon: Dr Leonce Birmingham. Anesthesia type: general.   Unable to see date of surgery on form due to poor print quality. I have faxed back to plastic surgeon asking for them to resend form.   I called the pt. She confirmed she has not had any strokes that she knows of since she was last seen here 06/2024. She continues aspirin  daily. She did say she went to the hospital with hypertension, chest pain back on 09/14/24 and they did CT head which showed:   IMPRESSION: 1. Generalized cerebral atrophy and microvascular disease changes of the supratentorial brain. 2. Moderate to marked severity right ethmoid sinus disease.  Will have Dr Rosemarie review and sign clearance if appropriate once we receive a clearer copy.

## 2024-10-05 ENCOUNTER — Other Ambulatory Visit: Payer: Self-pay | Admitting: Neurology

## 2024-10-05 ENCOUNTER — Other Ambulatory Visit: Payer: Self-pay

## 2024-10-08 NOTE — Telephone Encounter (Signed)
"   Dr. Raford,  Please review question from [facility].  Would you be able to comment on cardiac risk for patient's upcoming procedure?  Thank you for your help.  Please direct your response to CV DIV preop pool.  Josefa HERO. Onia Shiflett NP-C     10/08/2024, 8:14 AM [facility] [address] [place], [place] [address] Office [phone]   "

## 2024-10-29 ENCOUNTER — Encounter (HOSPITAL_BASED_OUTPATIENT_CLINIC_OR_DEPARTMENT_OTHER): Admitting: Cardiovascular Disease

## 2025-01-05 ENCOUNTER — Ambulatory Visit: Admitting: Adult Health
